# Patient Record
Sex: Male | Born: 1935
Health system: Southern US, Community
[De-identification: ages and names within clinical notes are randomized; demographics above are authoritative.]

## PROBLEM LIST (undated history)

## (undated) DIAGNOSIS — J189 Pneumonia, unspecified organism: Secondary | ICD-10-CM

## (undated) DIAGNOSIS — I495 Sick sinus syndrome: Secondary | ICD-10-CM

## (undated) DIAGNOSIS — Z789 Other specified health status: Secondary | ICD-10-CM

## (undated) DIAGNOSIS — I071 Rheumatic tricuspid insufficiency: Secondary | ICD-10-CM

## (undated) DIAGNOSIS — I10 Essential (primary) hypertension: Secondary | ICD-10-CM

## (undated) DIAGNOSIS — E119 Type 2 diabetes mellitus without complications: Secondary | ICD-10-CM

## (undated) DIAGNOSIS — I639 Cerebral infarction, unspecified: Secondary | ICD-10-CM

## (undated) DIAGNOSIS — I4891 Unspecified atrial fibrillation: Secondary | ICD-10-CM

## (undated) DIAGNOSIS — C189 Malignant neoplasm of colon, unspecified: Secondary | ICD-10-CM

## (undated) DIAGNOSIS — N400 Enlarged prostate without lower urinary tract symptoms: Secondary | ICD-10-CM

## (undated) DIAGNOSIS — K219 Gastro-esophageal reflux disease without esophagitis: Secondary | ICD-10-CM

## (undated) DIAGNOSIS — I4719 Other supraventricular tachycardia: Secondary | ICD-10-CM

## (undated) DIAGNOSIS — H409 Unspecified glaucoma: Secondary | ICD-10-CM

## (undated) DIAGNOSIS — I471 Supraventricular tachycardia: Secondary | ICD-10-CM

## (undated) DIAGNOSIS — E079 Disorder of thyroid, unspecified: Secondary | ICD-10-CM

## (undated) DIAGNOSIS — I509 Heart failure, unspecified: Secondary | ICD-10-CM

## (undated) DIAGNOSIS — E039 Hypothyroidism, unspecified: Secondary | ICD-10-CM

## (undated) DIAGNOSIS — D709 Neutropenia, unspecified: Secondary | ICD-10-CM

## (undated) DIAGNOSIS — R001 Bradycardia, unspecified: Secondary | ICD-10-CM

## (undated) DIAGNOSIS — I517 Cardiomegaly: Secondary | ICD-10-CM

## (undated) DIAGNOSIS — L719 Rosacea, unspecified: Secondary | ICD-10-CM

## (undated) HISTORY — PX: HERNIA REPAIR: SHX51

## (undated) HISTORY — DX: Supraventricular tachycardia: I47.1

## (undated) HISTORY — PX: PROSTATE SURGERY: SHX751

## (undated) HISTORY — PX: APPENDECTOMY: SHX54

## (undated) HISTORY — DX: Gastro-esophageal reflux disease without esophagitis: K21.9

## (undated) HISTORY — DX: Other supraventricular tachycardia: I47.19

## (undated) HISTORY — PX: TRANSURETHRAL RESECTION OF PROSTATE: SHX73

## (undated) HISTORY — DX: Rheumatic tricuspid insufficiency: I07.1

## (undated) HISTORY — DX: Disorder of thyroid, unspecified: E07.9

## (undated) HISTORY — DX: Essential (primary) hypertension: I10

## (undated) HISTORY — PX: UMBILICAL HERNIA REPAIR: SHX196

## (undated) HISTORY — PX: INGUINAL HERNIA REPAIR: SUR1180

## (undated) HISTORY — DX: Cerebral infarction, unspecified: I63.9

## (undated) MED FILL — Dexamethasone Sodium Phosphate Inj 100 MG/10ML: INTRAMUSCULAR | Qty: 1 | Status: AC

---

## 2007-01-06 ENCOUNTER — Ambulatory Visit (HOSPITAL_COMMUNITY): Admission: RE | Admit: 2007-01-06 | Discharge: 2007-01-06 | Payer: Self-pay | Admitting: Urology

## 2010-12-24 ENCOUNTER — Encounter (HOSPITAL_COMMUNITY): Payer: Medicare Other

## 2010-12-24 ENCOUNTER — Other Ambulatory Visit: Payer: Self-pay | Admitting: Ophthalmology

## 2010-12-24 DIAGNOSIS — Z01812 Encounter for preprocedural laboratory examination: Secondary | ICD-10-CM | POA: Insufficient documentation

## 2010-12-24 LAB — HEMOGLOBIN AND HEMATOCRIT, BLOOD: Hemoglobin: 13.2 g/dL (ref 13.0–17.0)

## 2010-12-24 LAB — BASIC METABOLIC PANEL
Calcium: 9 mg/dL (ref 8.4–10.5)
Chloride: 106 mEq/L (ref 96–112)
Creatinine, Ser: 1.2 mg/dL (ref 0.4–1.5)
Potassium: 4 mEq/L (ref 3.5–5.1)

## 2010-12-30 ENCOUNTER — Ambulatory Visit (HOSPITAL_COMMUNITY)
Admission: RE | Admit: 2010-12-30 | Discharge: 2010-12-30 | Disposition: A | Discharge status: Home / Self Care | Payer: Medicare Other | Source: Ambulatory Visit | Attending: Ophthalmology | Admitting: Ophthalmology

## 2010-12-30 DIAGNOSIS — Z7982 Long term (current) use of aspirin: Secondary | ICD-10-CM | POA: Insufficient documentation

## 2010-12-30 DIAGNOSIS — H2510 Age-related nuclear cataract, unspecified eye: Secondary | ICD-10-CM | POA: Insufficient documentation

## 2010-12-30 DIAGNOSIS — I10 Essential (primary) hypertension: Secondary | ICD-10-CM | POA: Insufficient documentation

## 2010-12-30 DIAGNOSIS — Z01810 Encounter for preprocedural cardiovascular examination: Secondary | ICD-10-CM | POA: Insufficient documentation

## 2010-12-30 DIAGNOSIS — Z79899 Other long term (current) drug therapy: Secondary | ICD-10-CM | POA: Insufficient documentation

## 2011-01-23 ENCOUNTER — Encounter (HOSPITAL_COMMUNITY): Payer: Medicare Other

## 2011-01-27 ENCOUNTER — Ambulatory Visit (HOSPITAL_COMMUNITY)
Admission: RE | Admit: 2011-01-27 | Discharge: 2011-01-27 | Disposition: A | Discharge status: Home / Self Care | Payer: Medicare Other | Source: Ambulatory Visit | Attending: Ophthalmology | Admitting: Ophthalmology

## 2011-01-27 DIAGNOSIS — I10 Essential (primary) hypertension: Secondary | ICD-10-CM | POA: Insufficient documentation

## 2011-01-27 DIAGNOSIS — H2510 Age-related nuclear cataract, unspecified eye: Secondary | ICD-10-CM | POA: Insufficient documentation

## 2011-01-27 DIAGNOSIS — Z79899 Other long term (current) drug therapy: Secondary | ICD-10-CM | POA: Insufficient documentation

## 2011-01-27 DIAGNOSIS — Z7982 Long term (current) use of aspirin: Secondary | ICD-10-CM | POA: Insufficient documentation

## 2011-01-27 DIAGNOSIS — Z01812 Encounter for preprocedural laboratory examination: Secondary | ICD-10-CM | POA: Insufficient documentation

## 2011-11-03 DIAGNOSIS — N281 Cyst of kidney, acquired: Secondary | ICD-10-CM | POA: Insufficient documentation

## 2012-05-24 ENCOUNTER — Other Ambulatory Visit: Payer: Self-pay | Admitting: Urology

## 2012-05-24 DIAGNOSIS — N281 Cyst of kidney, acquired: Secondary | ICD-10-CM

## 2012-05-26 ENCOUNTER — Ambulatory Visit
Admission: RE | Admit: 2012-05-26 | Discharge: 2012-05-26 | Disposition: A | Discharge status: Home / Self Care | Payer: Medicare Other | Source: Ambulatory Visit | Attending: Urology | Admitting: Urology

## 2012-05-26 DIAGNOSIS — N281 Cyst of kidney, acquired: Secondary | ICD-10-CM

## 2013-02-28 ENCOUNTER — Other Ambulatory Visit (INDEPENDENT_AMBULATORY_CARE_PROVIDER_SITE_OTHER): Payer: Self-pay | Admitting: Internal Medicine

## 2013-03-25 ENCOUNTER — Other Ambulatory Visit (HOSPITAL_COMMUNITY): Payer: Self-pay | Admitting: Cardiovascular Disease

## 2013-03-25 DIAGNOSIS — I4891 Unspecified atrial fibrillation: Secondary | ICD-10-CM

## 2013-03-29 ENCOUNTER — Ambulatory Visit: Payer: Medicare Other | Admitting: Cardiovascular Disease

## 2013-03-30 ENCOUNTER — Ambulatory Visit (HOSPITAL_COMMUNITY)
Admission: RE | Admit: 2013-03-30 | Discharge: 2013-03-30 | Disposition: A | Discharge status: Home / Self Care | Payer: Medicare Other | Source: Ambulatory Visit | Attending: Cardiovascular Disease | Admitting: Cardiovascular Disease

## 2013-03-30 DIAGNOSIS — I359 Nonrheumatic aortic valve disorder, unspecified: Secondary | ICD-10-CM | POA: Insufficient documentation

## 2013-03-30 DIAGNOSIS — I10 Essential (primary) hypertension: Secondary | ICD-10-CM | POA: Insufficient documentation

## 2013-03-30 DIAGNOSIS — I379 Nonrheumatic pulmonary valve disorder, unspecified: Secondary | ICD-10-CM | POA: Insufficient documentation

## 2013-03-30 DIAGNOSIS — I495 Sick sinus syndrome: Secondary | ICD-10-CM | POA: Insufficient documentation

## 2013-03-30 DIAGNOSIS — K219 Gastro-esophageal reflux disease without esophagitis: Secondary | ICD-10-CM | POA: Insufficient documentation

## 2013-03-30 DIAGNOSIS — I059 Rheumatic mitral valve disease, unspecified: Secondary | ICD-10-CM | POA: Insufficient documentation

## 2013-03-30 DIAGNOSIS — I079 Rheumatic tricuspid valve disease, unspecified: Secondary | ICD-10-CM | POA: Insufficient documentation

## 2013-03-30 DIAGNOSIS — I4891 Unspecified atrial fibrillation: Secondary | ICD-10-CM

## 2013-03-30 DIAGNOSIS — I517 Cardiomegaly: Secondary | ICD-10-CM | POA: Insufficient documentation

## 2013-03-30 DIAGNOSIS — I4949 Other premature depolarization: Secondary | ICD-10-CM | POA: Insufficient documentation

## 2013-03-31 NOTE — Progress Notes (Signed)
Echo Completed. Veda Canning, RDCS

## 2013-04-04 ENCOUNTER — Ambulatory Visit (INDEPENDENT_AMBULATORY_CARE_PROVIDER_SITE_OTHER): Payer: Medicare Other | Admitting: Cardiovascular Disease

## 2013-04-04 ENCOUNTER — Encounter: Payer: Self-pay | Admitting: Cardiovascular Disease

## 2013-04-04 VITALS — BP 146/80 | HR 77 | Ht 68.0 in | Wt 161.8 lb

## 2013-04-04 DIAGNOSIS — N401 Enlarged prostate with lower urinary tract symptoms: Secondary | ICD-10-CM | POA: Insufficient documentation

## 2013-04-04 DIAGNOSIS — K219 Gastro-esophageal reflux disease without esophagitis: Secondary | ICD-10-CM

## 2013-04-04 DIAGNOSIS — N400 Enlarged prostate without lower urinary tract symptoms: Secondary | ICD-10-CM

## 2013-04-04 DIAGNOSIS — I498 Other specified cardiac arrhythmias: Secondary | ICD-10-CM

## 2013-04-04 DIAGNOSIS — I4891 Unspecified atrial fibrillation: Secondary | ICD-10-CM

## 2013-04-04 DIAGNOSIS — I119 Hypertensive heart disease without heart failure: Secondary | ICD-10-CM

## 2013-04-04 DIAGNOSIS — I495 Sick sinus syndrome: Secondary | ICD-10-CM | POA: Insufficient documentation

## 2013-04-04 DIAGNOSIS — I471 Supraventricular tachycardia: Secondary | ICD-10-CM

## 2013-04-04 DIAGNOSIS — E782 Mixed hyperlipidemia: Secondary | ICD-10-CM

## 2013-04-04 MED ORDER — DRONEDARONE HCL 400 MG PO TABS: 400.0000 mg | ORAL_TABLET | Freq: Two times a day (BID) | ORAL | Status: DC

## 2013-04-04 NOTE — Patient Instructions (Signed)
Your physician recommends that you return for lab work .  Your physician has recommended you make the following change in your medication: Multaq 400 mg has been started to take twice daily.This medication has been sent to your pharmacy.  Your physician recommends that you schedule a follow-up appointment in: 3 weeks.

## 2013-04-04 NOTE — Progress Notes (Signed)
Patient ID: DAIMIEN PATMON, male   DOB: 12-09-1925, 79 y.o.   MRN: [id number]    HPI: Dylan York, is a 79 y.o. male who presents to the office today for cardiology followup evaluation of his recent documentation of atrial fibrillation.  Mr. Terhune has a history of probable sick sinus syndrome in the past has had episodes of atrial tachycardia as well as sinus bradycardia with also ventricular bigeminy and frequent PVCs. This ultimately stabilized fairly well with slow titration of his Bystolic. He also has a history of BPH and status post TURP by Dr. Darvin Neighbours, history of hypertension, moderate tricuspid regurgitation, as well as GERD.  When  I last saw him on 03/08/2013 he was in atrial fibrillation of questionable duration. At that time ventricular rate was 85 beats per minute and he had nonspecific ST changes. That time, I did increase his Bystolic to 7.5 mg in the morning and recommended he continue 5 mg at night. He would not become hypotensive I discontinued his 1 mg of Cardura. I also initiated anticoagulation with Ellik was 2.5 mg twice a day. I scheduled him for a followup echo Doppler study. This was done on 03/30/2013 and revealed normal systolic function with an ejection fraction of 60-65%. He did have aortic valve sclerosis. HEENT significant biatrial enlargement as well as mild pulmonary hypertension with a PA pressure 41 mm. He presents now for followup evaluation.  Past history again is notable for palpitations, BPH, TURP, hypertension, and in the past he was treated with doxycycline for tick bite.    Current Outpatient Prescriptions  Medication Sig Dispense Refill  . apixaban (ELIQUIS) 2.5 MG TABS tablet Take by mouth 2 (two) times daily.      . finasteride (PROSCAR) 5 MG tablet Take 5 mg by mouth daily.      . hydrochlorothiazide (HYDRODIURIL) 12.5 MG tablet Take 12.5 mg by mouth daily.      . nebivolol (BYSTOLIC) 5 MG tablet Take 5 mg by mouth 2 (two) times daily.      Marland Kitchen  dronedarone (MULTAQ) 400 MG tablet Take 1 tablet (400 mg total) by mouth 2 (two) times daily with a meal.  60 tablet  6  . omeprazole (PRILOSEC) 20 MG capsule TAKE 1 CAPSULE BY MOUTH 30 MINUTES BEFORE BREAKFAST  30 capsule  11   No current facility-administered medications for this visit.    Social history is notable in that he is married has 3 children. His remote tobacco history but he quit in 1978. There is no alcohol use. He keeps himself busy and spends much time outdoors. 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 He also plays golf at least once a week.   ROS is notable for shortness of breath with activity. He denies chest pain. He denies fever chills or night sweats. He denies leg swelling. He denies bleeding since initiating Ellik was. He is unaware of his irregular rhythm. He denies paresthesias. He denies visual symptoms. He denies headache. Other system review is negative  PE BP 146/80  Pulse 77  Ht 5\' 8"  (1.727 m)  Wt 161 lb 12.8 oz (73.392 kg)  BMI 24.61 kg/m2  General: Alert, oriented, no distress.  HEENT: Normocephalic, atraumatic. Pupils round and reactive; sclera anicteric;  Nose without nasal septal hypertrophy Mouth/Parynx benign; Mallinpatti scale 3 Neck: No JVD, no carotid briuts Lungs: clear to ausculatation and percussion; no wheezing or rales Heart: Irregularly irregular rhythm with a 1/6 systolic murmur. s1 s2 normal  Abdomen: soft,  nontender; no hepatosplenomehaly, BS+; abdominal aorta nontender and not dilated by palpation. Pulses 2+ Extremities: no clubbinbg cyanosis or edema, Homan's sign negative  Neurologic: grossly nonfocal  ECG: Atrial fibrillation at 77 beats per minute  QTc 475 ms procures duration 110 ms.  LABS:  BMET    Component Value Date/Time   NA 139 12/24/2010 1122   K 4.0 12/24/2010 1122   CL 106 12/24/2010 1122   CO2 22 12/24/2010 1122   GLUCOSE 140* 12/24/2010 1122   BUN 14 12/24/2010 1122   CREATININE 1.20 12/24/2010 1122   CALCIUM 9.0 12/24/2010  1122   GFRNONAA 58* 12/24/2010 1122   GFRAA  Value: >60        The eGFR has been calculated using the MDRD equation. This calculation has not been validated in all clinical situations. eGFR's persistently <60 mL/min signify possible Chronic Kidney Disease. 12/24/2010 1122     Hepatic Function Panel  No results found for this basename: prot, albumin, ast, alt, alkphos, bilitot, bilidir, ibili     CBC    Component Value Date/Time   HGB 13.2 12/24/2010 1122   HCT 38.3* 12/24/2010 1122     BNP No results found for this basename: probnp    Lipid Panel  No results found for this basename: chol, trig, hdl, cholhdl, vldl, ldlcalc     RADIOLOGY: No results found.    ASSESSMENT AND PLAN: History Kussman remains in persistent atrial fibrillation of at least several weeks' duration. He now has been on Ellik was anticoagulation was initiated on 03/08/2013. His echo Doppler study confirm systolic function is normal with an ejection fraction of 60-65%. He does have significant biatrial enlargement which may be contributory to his atrial fibrillation. Presently, since he does not have any history of heart failure and has concerns with being outside with amiodarone and sun exposure I am initiating multiform milligrams twice a day. I will check a complete set of laboratory the fasting state consisting of a CBC, comprehensive metabolic panel, magnesium, thyroid function studies, and lipid status. I will see him in 3 weeks for followup evaluation. At that time he still maintaining his atrial fibrillation we will then make plans to consider attempt at restoration of sinus rhythm with cardioversion.     Lennette Bihari, MD, [facility]  04/04/2013 6:28 PM

## 2013-04-05 ENCOUNTER — Other Ambulatory Visit (INDEPENDENT_AMBULATORY_CARE_PROVIDER_SITE_OTHER): Payer: Self-pay | Admitting: Internal Medicine

## 2013-04-05 ENCOUNTER — Telehealth: Payer: Self-pay | Admitting: Cardiovascular Disease

## 2013-04-05 NOTE — Telephone Encounter (Signed)
Mr.Warning is calling because one of his medications have been left off his list of medications.Marland KitchenLotensin40mg  and he is schedule to continue to take this medication .Marland Kitchen Any questions please call at [phone].  thanks

## 2013-04-05 NOTE — Telephone Encounter (Signed)
Message forwarded to Dr. Kelly/Wanda, CMA.  

## 2013-04-06 ENCOUNTER — Telehealth: Payer: Self-pay | Admitting: *Deleted

## 2013-04-06 ENCOUNTER — Other Ambulatory Visit (INDEPENDENT_AMBULATORY_CARE_PROVIDER_SITE_OTHER): Payer: Self-pay | Admitting: Internal Medicine

## 2013-04-06 DIAGNOSIS — K219 Gastro-esophageal reflux disease without esophagitis: Secondary | ICD-10-CM

## 2013-04-06 LAB — CBC
MCHC: 34 g/dL (ref 30.0–36.0)
MCV: 93.2 fL (ref 78.0–100.0)
RDW: 13.1 % (ref 11.5–15.5)
WBC: 4.2 10*3/uL (ref 4.0–10.5)

## 2013-04-06 LAB — LIPID PANEL
Cholesterol: 162 mg/dL (ref 0–200)
LDL Cholesterol: 92 mg/dL (ref 0–99)
Total CHOL/HDL Ratio: 3.1 Ratio
Triglycerides: 84 mg/dL (ref <150)

## 2013-04-06 MED ORDER — OMEPRAZOLE 20 MG PO CPDR: DELAYED_RELEASE_CAPSULE | ORAL | Status: DC

## 2013-04-06 NOTE — Telephone Encounter (Signed)
Patient called to notify us that Lotensin was left off of his medication list. Questioned if he is supposed to continue medication. Informed him per Dr. Tresa Endo to continue as before. I will add it back to his medication list.

## 2013-04-07 ENCOUNTER — Telehealth: Payer: Self-pay | Admitting: Cardiovascular Disease

## 2013-04-07 NOTE — Telephone Encounter (Signed)
Discontinue Multaq.  Follow up as scheduled with Dr. Tresa Endo.

## 2013-04-07 NOTE — Telephone Encounter (Signed)
Dr Tresa Endo started him on a new medicine Monday for atrial fib-having side effects-Please call!

## 2013-04-07 NOTE — Telephone Encounter (Signed)
Returned call and informed pt per instructions by MD/PA.  Pt verbalized understanding and agreed w/ plan.  Message forwarded to Dr. Tresa Endo.  This note placed on Dr. Landry Dyke cart for review by Dr. Tresa Endo on Monday.  Pt aware.

## 2013-04-07 NOTE — Telephone Encounter (Signed)
Returned call.  Pt stated he saw Dr. Tresa Endo on Monday and he prescribed a medication for afib.  Stated he has taken four pills.  Pt stated he has experienced SOB this morning walking a short distance on a flat surface.  Also stated he didn't sleep well last night.  C/o diarrhea and fatigue.  Stated stool was normal, then loose, then "full blown diarrhea."  Stated he was a little nauseous last night.  Denied changes in diet or liquid intake.  Stated [place] and he wanted to call before leaving.  Stated new med is Advertising copywriter.  Pt informed Dr. Tresa Endo is out of the office for the rest of the week and a PA will be notified to review and advise.  Pt verbalized understanding and agreed w/ plan.  Message forwarded to B. Leron Croak, PA-C for further instructions.  No paper chart.  Last OV in Epic.

## 2013-04-12 NOTE — Telephone Encounter (Signed)
Med list updated

## 2013-04-22 NOTE — Progress Notes (Signed)
Quick Note:  Patient has appointment on 6/24 to discuss results. ______

## 2013-04-26 ENCOUNTER — Encounter: Payer: Self-pay | Admitting: Cardiovascular Disease

## 2013-04-26 ENCOUNTER — Ambulatory Visit (INDEPENDENT_AMBULATORY_CARE_PROVIDER_SITE_OTHER): Payer: Medicare Other | Admitting: Cardiovascular Disease

## 2013-04-26 VITALS — BP 110/80 | HR 78 | Ht 68.0 in | Wt 160.2 lb

## 2013-04-26 DIAGNOSIS — I4891 Unspecified atrial fibrillation: Secondary | ICD-10-CM

## 2013-04-26 DIAGNOSIS — I119 Hypertensive heart disease without heart failure: Secondary | ICD-10-CM

## 2013-04-26 DIAGNOSIS — Z7901 Long term (current) use of anticoagulants: Secondary | ICD-10-CM

## 2013-04-26 DIAGNOSIS — I495 Sick sinus syndrome: Secondary | ICD-10-CM

## 2013-04-26 MED ORDER — AMIODARONE HCL 200 MG PO TABS: 200.0000 mg | ORAL_TABLET | Freq: Every day | ORAL | Status: DC

## 2013-04-26 NOTE — Patient Instructions (Addendum)
Your physician recommends that you schedule a follow-up appointment in: 4 weeks  Your physician has recommended you make the following change in your medication: stop MULTAQ. Start new prescription for amiodarone. This has already been sent to your pharmacy.

## 2013-04-27 NOTE — Progress Notes (Signed)
Quick Note:  Patient has appoinmtent on 04/26/13. Results discussed during the visit. ______

## 2013-05-02 ENCOUNTER — Telehealth: Payer: Self-pay | Admitting: Cardiovascular Disease

## 2013-05-02 NOTE — Telephone Encounter (Signed)
Eliquis has been approved through 05/02/2014 w/[employer]

## 2013-05-02 NOTE — Telephone Encounter (Signed)
Is calling to find out if you have located the information in which you needed for the insurance so that he may get his medication .Marland Kitchen

## 2013-05-02 NOTE — Telephone Encounter (Signed)
Please call-his medication was declined by his insurance!i

## 2013-05-02 NOTE — Telephone Encounter (Signed)
Returned call.  Pt stated he spoke w/ Britta Mccreedy earlier and gave her all of the information about Eliquis.  Stated she did tell him there was a card he could get for a free 30-day trial and he wasn't able to come get it.  Stated his sister-in-law is at [facility] and can come by to pick it up for him if that's okay.  Pt stated he only has two days left of medication.  Pt informed card will be left at front desk for pick-up and Britta Mccreedy will be notified since she is working on PA already.  Pt verbalized understanding and agreed w/ plan.

## 2013-05-05 ENCOUNTER — Encounter: Payer: Self-pay | Admitting: Cardiovascular Disease

## 2013-05-05 DIAGNOSIS — Z7901 Long term (current) use of anticoagulants: Secondary | ICD-10-CM | POA: Insufficient documentation

## 2013-05-05 NOTE — Progress Notes (Signed)
Patient ID: Dylan York, male   DOB: [birth year], 79 y.o.   MRN: [id number]     HPI: Dylan York, is a 79 y.o. male who presents to the office in followup of his recent development of atrial fibrillation.  [name] has a history of sick sinus syndrome in the past has felt episodes of atrial tachycardia as well as sinus bradycardia, ventricular bigeminy, and frequent PVCs he had ultimately stabilized with Bystolic when he was seen on 03/08/2013 atrial fibrillation of questionable duration. At that time I increased his and his started anticoagulation with Eliquis 2.5 mg twice a day. An echo Doppler study was done on 03/30/2013 which revealed normal systolic function with an ejection fraction of 60-65%, aortic valve sclerosis, biatrial enlargement, as well as mild pulmonary hypertension with a PA pressure of 41 mm. He was seen in followup on 04/04/2013 facility for further fibrillation at 77 beats per minute. After much discussion with the restoration of sinus rhythm and antiarrhythmic therapy alternatives, and ultimately started him on Multaq 400 milligrams twice a day. He apparently did not seem to tolerate this and ultimately discontinue its use. He presents for followup evaluation.  [name] denies chest pain. He denies presyncope. He denies increasing shortness of breath admits that he was a little more fatigued he had been previously.   Past medical history is notable for hypertension, BPH, TURP.  Not on File  Current Outpatient Prescriptions  Medication Sig Dispense Refill  . apixaban (ELIQUIS) 2.5 MG TABS tablet Take by mouth 2 (two) times daily.      . benazepril (LOTENSIN) 40 MG tablet Take 40 mg by mouth daily.      . finasteride (PROSCAR) 5 MG tablet Take 5 mg by mouth daily.      . hydrochlorothiazide (HYDRODIURIL) 12.5 MG tablet Take 12.5 mg by mouth daily.      . nebivolol (BYSTOLIC) 5 MG tablet Take 5 mg by mouth 2 (two) times daily.      Marland Kitchen omeprazole (PRILOSEC) 20 MG capsule  TAKE 1 CAPSULE BY MOUTH 30 MINUTES BEFORE BREAKFAST EMERGENCY REFILL FAXED DR  90 capsule  11  . TRAVATAN Z 0.004 % SOLN ophthalmic solution Place 1 drop into both eyes daily.      Marland Kitchen amiodarone (PACERONE) 200 MG tablet Take 1 tablet (200 mg total) by mouth daily.  30 tablet  6  . dronedarone (MULTAQ) 400 MG tablet Take 1 tablet (400 mg total) by mouth 2 (two) times daily with a meal.  60 tablet  6   No current facility-administered medications for this visit.    Socially he is married and has 3 children he quit tobacco in 1978. 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  ROS is negative for fevers, chills or night sweats. He denies rash he apparently had only taken the multiecho 2 days before he discontinued it. He denies recent reflux he denies any heart failure. He denies syncope. He denies bleeding. Denies paresthesias.   Other system review is negative.  PE BP 110/80  Pulse 78  Ht 5\' 8"  (1.727 m)  Wt 160 lb 3.2 oz (72.666 kg)  BMI 24.36 kg/m2  General: Alert, oriented, no distress.  Skin: normal turgor, no rashes HEENT: Normocephalic, atraumatic. Pupils round and reactive; sclera anicteric;no lid lag.  Nose without nasal septal hypertrophy Mouth/Parynx benign; Mallinpatti scale 3 Neck: No JVD, no carotid briuts Lungs: clear to ausculatation and percussion; no  wheezing or rales Heart:  irregular rhythm with a ventricular rate approximately 80; no s3. 1/6 sem Abdomen: soft, nontender; no hepatosplenomehaly, BS+; abdominal aorta nontender and not dilated by palpation. Pulses 2+ Extremities: no clubbing cyanosis or edema, Homan's sign negative  Neurologic: grossly nonfocal  ECG: Atrial fibrillation at 70 beats per minute with nonspecific ST-T changes. QTc  interval 481 ms  LABS:  BMET    Component Value Date/Time   NA 139 12/24/2010 1122   K 4.0 12/24/2010 1122   CL 106 12/24/2010 1122   CO2 22 12/24/2010 1122   GLUCOSE 140* 12/24/2010  1122   BUN 14 12/24/2010 1122   CREATININE 1.20 12/24/2010 1122   CALCIUM 9.0 12/24/2010 1122   GFRNONAA 58* 12/24/2010 1122   GFRAA  Value: >60        The eGFR has been calculated using the MDRD equation. This calculation has not been validated in all clinical situations. eGFR's persistently <60 mL/min signify possible Chronic Kidney Disease. 12/24/2010 1122     Hepatic Function Panel  No results found for this basename: prot, albumin, ast, alt, alkphos, bilitot, bilidir, ibili     CBC    Component Value Date/Time   WBC 4.2 04/06/2013 0850   RBC 4.13* 04/06/2013 0850   HGB 13.1 04/06/2013 0850   HCT 38.5* 04/06/2013 0850   PLT 187 04/06/2013 0850   MCV 93.2 04/06/2013 0850   MCH 31.7 04/06/2013 0850   MCHC 34.0 04/06/2013 0850   RDW 13.1 04/06/2013 0850     BNP No results found for this basename: probnp    Lipid Panel     Component Value Date/Time   CHOL 162 04/06/2013 0850   TRIG 84 04/06/2013 0850   HDL 53 04/06/2013 0850   CHOLHDL 3.1 04/06/2013 0850   VLDL 17 04/06/2013 0850   LDLCALC 92 04/06/2013 0850     RADIOLOGY: No results found.    ASSESSMENT AND PLAN: Mr. [name] continues to be in persistent atrial fibrillation rate of at least > 6 weeks duration. He has been on and tie coagulation since 03/08/2013. He did not convert with increased beta blocker therapy or a trial of tach. Presently, I will give him a trial of low-dose amiodarone at 200 mg daily. I will see him back in the office in 4 weeks for followup evaluation. I did review the echo Doppler study with him. I reviewed his recent laboratory data hemoglobin 13.1 hematocrit 38.5. Total cholesterol 162 triglycerides 84 HDL 53 LDL 92. TSH 2.218 and magnesium level is 1.9. He is still in atrial fibrillation attempt at cardioversion low-dose amiodarone versus ciliary control his atrial fibrillation.       Lennette Bihari, MD, [facility]  05/05/2013 12:30 PM

## 2013-05-27 ENCOUNTER — Ambulatory Visit (INDEPENDENT_AMBULATORY_CARE_PROVIDER_SITE_OTHER): Payer: Medicare Other | Admitting: Cardiovascular Disease

## 2013-05-27 ENCOUNTER — Encounter: Payer: Self-pay | Admitting: Cardiovascular Disease

## 2013-05-27 VITALS — BP 142/88 | HR 68 | Ht 68.0 in | Wt 162.6 lb

## 2013-05-27 DIAGNOSIS — I4891 Unspecified atrial fibrillation: Secondary | ICD-10-CM

## 2013-05-27 DIAGNOSIS — I495 Sick sinus syndrome: Secondary | ICD-10-CM

## 2013-05-27 DIAGNOSIS — I119 Hypertensive heart disease without heart failure: Secondary | ICD-10-CM

## 2013-05-27 DIAGNOSIS — Z7901 Long term (current) use of anticoagulants: Secondary | ICD-10-CM

## 2013-05-27 MED ORDER — AMIODARONE HCL 200 MG PO TABS: 200.0000 mg | ORAL_TABLET | Freq: Two times a day (BID) | ORAL | Status: DC

## 2013-05-27 NOTE — Patient Instructions (Addendum)
Your physician recommends that you schedule a follow-up appointment in: 3-4 weeks  Your physician has recommended you make the following change in your medication: increase Amiodarone to 200 mg twice daily and cut bystolic to 5 mg daily

## 2013-05-27 NOTE — Progress Notes (Signed)
Patient ID: Dylan York, male   DOB: November 16, 1925, 79 y.o.   MRN: [id number]     HPI: Dylan York, is a 79 y.o. male who presents to the office in followup of his recent development of atrial fibrillation.  Dylan York has a history of sick sinus syndrome in the past has felt episodes of atrial tachycardia as well as sinus bradycardia, ventricular bigeminy, and frequent PVCs he had ultimately stabilized with Bystolic when he was seen on 03/08/2013 atrial fibrillation of questionable duration. At that time I increased his Bystolic and his started anticoagulation with Eliquis 2.5 mg twice a day. An echo Doppler study was done on 03/30/2013 which revealed normal systolic function with an ejection fraction of 60-65%, aortic valve sclerosis, biatrial enlargement, as well as mild pulmonary hypertension with a PA pressure of 41 mm. He was seen in followup on 04/04/2013 and was in AF 77 beats per minute. After much discussion with the restoration of sinus rhythm and antiarrhythmic therapy alternatives, and ultimately started him on Multaq 400 milligrams twice a day. He apparently did not seem to tolerate this and ultimately discontinue its use. He presents for followup evaluation. I last saw him on 04/26/2013. At that time, we had further discussion concerning his atrial fibrillation and elected to initiate amiodarone 200 mg daily. He has tolerated this well. He denies any significant side effects. He still notes some shortness of breath particularly when he gets his mail walking up and down a hill.   Dylan York denies chest pain. He denies presyncope. He denies increasing shortness of breath admits that he was a little more fatigued he had been previously.   Past medical history is notable for hypertension, BPH, TURP.  Allergies  Allergen Reactions  . Multaq (Dronedarone) Shortness Of Breath    Current Outpatient Prescriptions  Medication Sig Dispense Refill  . amiodarone (PACERONE) 200 MG tablet Take 200  mg by mouth 2 (two) times daily.      Marland Kitchen apixaban (ELIQUIS) 2.5 MG TABS tablet Take by mouth 2 (two) times daily.      . benazepril (LOTENSIN) 40 MG tablet Take 40 mg by mouth daily.      . finasteride (PROSCAR) 5 MG tablet Take 5 mg by mouth daily.      . hydrochlorothiazide (HYDRODIURIL) 12.5 MG tablet Take 12.5 mg by mouth daily.      . Multiple Vitamins-Minerals (MULTIVITAL PO) Take by mouth daily.      . nebivolol (BYSTOLIC) 5 MG tablet Take 5 mg by mouth daily.       Marland Kitchen omeprazole (PRILOSEC) 20 MG capsule TAKE 1 CAPSULE BY MOUTH 30 MINUTES BEFORE BREAKFAST EMERGENCY REFILL FAXED DR  90 capsule  11  . TRAVATAN Z 0.004 % SOLN ophthalmic solution Place 1 drop into both eyes daily.      Marland Kitchen VITAMIN E PO Take by mouth daily.       No current facility-administered medications for this visit.    Socially he is married and has 3 children he quit tobacco in 1978. 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  ROS is negative for fevers, chills or night sweats. He denies rash. There is no wheezing. He denies recent tachycardia palpitations. There is no presyncope. He denies recent reflux he denies any heart failure. He denies syncope. He denies bleeding. He denies abdominal pain. Denies paresthesias.   Other system review is negative.  PE BP 142/88  Pulse 68  Ht 5\' 8"  (1.727 m)  Wt 162 lb 9.6 oz (73.755 kg)  BMI 24.73 kg/m2  General: Alert, oriented, no distress.  Skin: normal turgor, no rashes HEENT: Normocephalic, atraumatic. Pupils round and reactive; sclera anicteric;no lid lag.  Nose without nasal septal hypertrophy Mouth/Parynx benign; Mallinpatti scale 3 Neck: No JVD, no carotid briuts Lungs: clear to ausculatation and percussion; no wheezing or rales Heart:  irregular rhythm with a ventricular rate approximately  68; no s3. 1/6 sem Abdomen: soft, nontender; no hepatosplenomehaly, BS+; abdominal aorta nontender and not dilated by  palpation. Pulses 2+ Extremities: no clubbing cyanosis or edema, Homan's sign negative  Neurologic: grossly nonfocal  ECG: Atrial fibrillation at 68 beats per minute with nonspecific ST-T changes. QTc  interval 455 ms  LABS:  BMET    Component Value Date/Time   NA 139 12/24/2010 1122   K 4.0 12/24/2010 1122   CL 106 12/24/2010 1122   CO2 22 12/24/2010 1122   GLUCOSE 140* 12/24/2010 1122   BUN 14 12/24/2010 1122   CREATININE 1.20 12/24/2010 1122   CALCIUM 9.0 12/24/2010 1122   GFRNONAA 58* 12/24/2010 1122   GFRAA  Value: >60        The eGFR has been calculated using the MDRD equation. This calculation has not been validated in all clinical situations. eGFR's persistently <60 mL/min signify possible Chronic Kidney Disease. 12/24/2010 1122     Hepatic Function Panel  No results found for this basename: prot,  albumin,  ast,  alt,  alkphos,  bilitot,  bilidir,  ibili     CBC    Component Value Date/Time   WBC 4.2 04/06/2013 0850   RBC 4.13* 04/06/2013 0850   HGB 13.1 04/06/2013 0850   HCT 38.5* 04/06/2013 0850   PLT 187 04/06/2013 0850   MCV 93.2 04/06/2013 0850   MCH 31.7 04/06/2013 0850   MCHC 34.0 04/06/2013 0850   RDW 13.1 04/06/2013 0850     BNP No results found for this basename: probnp    Lipid Panel     Component Value Date/Time   CHOL 162 04/06/2013 0850   TRIG 84 04/06/2013 0850   HDL 53 04/06/2013 0850   CHOLHDL 3.1 04/06/2013 0850   VLDL 17 04/06/2013 0850   LDLCALC 92 04/06/2013 0850     RADIOLOGY: No results found.    ASSESSMENT AND PLAN: [name] continues to be in persistent atrial fibrillation rate of at least several months duration. He has been on and tie coagulation since 03/08/2013. He did not convert with increased beta blocker therapy or a trial of Multaq.  He has tolerated a trial of low-dose amiodarone at 200 mg daily. EKG today continues to show atrial fibrillation with a controlled ventricular response. He is unaware of breakthrough tachycardia palpitations. At this  point, we discussed consideration for attempt at cardioversion. I am recommending he increase his amiodarone to 200 mg twice a day since he is tolerating the 2 mg dose and I will reduce his diastolic from 10 mg to 5 mg. If his resting pulse gets into the low 50s he will discontinue the Bystolic altogether while taking the higher amiodarone dose. We discussed options considering cardioversion in several weeks versus seeing him back in the office. He would prefer a followup office visit. I will see him in 3-4 weeks for followup evaluation.      Lennette Bihari, MD, [facility]  05/27/2013 9:27 AM

## 2013-06-10 ENCOUNTER — Encounter: Payer: Self-pay | Admitting: Cardiovascular Disease

## 2013-06-24 ENCOUNTER — Encounter: Payer: Self-pay | Admitting: *Deleted

## 2013-06-29 ENCOUNTER — Ambulatory Visit (INDEPENDENT_AMBULATORY_CARE_PROVIDER_SITE_OTHER): Payer: Medicare Other | Admitting: Cardiovascular Disease

## 2013-06-29 ENCOUNTER — Encounter: Payer: Self-pay | Admitting: Cardiovascular Disease

## 2013-06-29 VITALS — BP 130/80 | HR 65 | Ht 68.0 in | Wt 160.0 lb

## 2013-06-29 DIAGNOSIS — I495 Sick sinus syndrome: Secondary | ICD-10-CM

## 2013-06-29 DIAGNOSIS — Z01818 Encounter for other preprocedural examination: Secondary | ICD-10-CM

## 2013-06-29 DIAGNOSIS — Z7901 Long term (current) use of anticoagulants: Secondary | ICD-10-CM

## 2013-06-29 DIAGNOSIS — K219 Gastro-esophageal reflux disease without esophagitis: Secondary | ICD-10-CM

## 2013-06-29 DIAGNOSIS — I4891 Unspecified atrial fibrillation: Secondary | ICD-10-CM

## 2013-06-29 DIAGNOSIS — D689 Coagulation defect, unspecified: Secondary | ICD-10-CM

## 2013-06-29 DIAGNOSIS — I119 Hypertensive heart disease without heart failure: Secondary | ICD-10-CM

## 2013-06-29 MED ORDER — APIXABAN 2.5 MG PO TABS: 2.5000 mg | ORAL_TABLET | Freq: Two times a day (BID) | ORAL | Status: DC

## 2013-06-29 NOTE — Patient Instructions (Addendum)
Your physician has recommended that you have a Cardioversion (DCCV). Electrical Cardioversion uses a jolt of electricity to your heart either through paddles or wired patches attached to your chest. This is a controlled, usually prescheduled, procedure. Defibrillation is done under light anesthesia in the hospital, and you usually go home the day of the procedure. This is done to get your heart back into a normal rhythm. You are not awake for the procedure. Please see the instruction sheet given to you today. This will be done the week of September 22.  Your physician recommends that you return for lab work and chest x-ray within 7 days of the procedure.

## 2013-07-03 ENCOUNTER — Encounter: Payer: Self-pay | Admitting: *Deleted

## 2013-07-03 ENCOUNTER — Telehealth: Payer: Self-pay | Admitting: *Deleted

## 2013-07-03 ENCOUNTER — Other Ambulatory Visit: Payer: Self-pay | Admitting: *Deleted

## 2013-07-03 DIAGNOSIS — Z01818 Encounter for other preprocedural examination: Secondary | ICD-10-CM

## 2013-07-03 NOTE — Telephone Encounter (Signed)
Orders for cardioversion entered into the [facility] system.

## 2013-07-21 ENCOUNTER — Telehealth: Payer: Self-pay | Admitting: *Deleted

## 2013-07-21 ENCOUNTER — Other Ambulatory Visit: Payer: Self-pay | Admitting: *Deleted

## 2013-07-21 ENCOUNTER — Ambulatory Visit
Admission: RE | Admit: 2013-07-21 | Discharge: 2013-07-21 | Disposition: A | Discharge status: Home / Self Care | Payer: Medicare Other | Source: Ambulatory Visit | Attending: Cardiovascular Disease | Admitting: Cardiovascular Disease

## 2013-07-21 DIAGNOSIS — R935 Abnormal findings on diagnostic imaging of other abdominal regions, including retroperitoneum: Secondary | ICD-10-CM

## 2013-07-21 DIAGNOSIS — Z01818 Encounter for other preprocedural examination: Secondary | ICD-10-CM

## 2013-07-21 DIAGNOSIS — R918 Other nonspecific abnormal finding of lung field: Secondary | ICD-10-CM

## 2013-07-21 LAB — COMPREHENSIVE METABOLIC PANEL
ALT: 21 U/L (ref 0–53)
AST: 24 U/L (ref 0–37)
Albumin: 3.9 g/dL (ref 3.5–5.2)
Alkaline Phosphatase: 66 U/L (ref 39–117)
CO2: 28 mEq/L (ref 19–32)
Chloride: 97 mEq/L (ref 96–112)
Sodium: 135 mEq/L (ref 135–145)
Total Bilirubin: 1.9 mg/dL — ABNORMAL HIGH (ref 0.3–1.2)
Total Protein: 6.3 g/dL (ref 6.0–8.3)

## 2013-07-21 LAB — CBC
MCH: 32.3 pg (ref 26.0–34.0)
MCV: 93.7 fL (ref 78.0–100.0)
RBC: 4.46 MIL/uL (ref 4.22–5.81)
RDW: 14.4 % (ref 11.5–15.5)

## 2013-07-21 LAB — PROTIME-INR: INR: 1.31 (ref <1.50)

## 2013-07-21 NOTE — Progress Notes (Signed)
Quick Note:  CT chest with contrast ordered per Dr. Tresa Endo. Scheduled for 2:15 pm tomorrow @ [facility] [address] Patient notified of appointment and prep details- NO SOLID FOODS AFTER 10:30 am. Liquids and meds only. Patient voiced understanding. ______

## 2013-07-21 NOTE — Telephone Encounter (Signed)
Chest xray done this morning shows a 15mm density in the left mid lung. CT with contrast is recommended by the radiologist.  Please see full dictation for details.  I will send this message to Dr Tresa Endo

## 2013-07-22 ENCOUNTER — Ambulatory Visit
Admission: RE | Admit: 2013-07-22 | Discharge: 2013-07-22 | Disposition: A | Discharge status: Home / Self Care | Payer: Medicare Other | Source: Ambulatory Visit | Attending: Cardiovascular Disease | Admitting: Cardiovascular Disease

## 2013-07-22 DIAGNOSIS — R935 Abnormal findings on diagnostic imaging of other abdominal regions, including retroperitoneum: Secondary | ICD-10-CM

## 2013-07-22 DIAGNOSIS — R918 Other nonspecific abnormal finding of lung field: Secondary | ICD-10-CM

## 2013-07-22 MED ORDER — IOHEXOL 300 MG/ML  SOLN
75.0000 mL | Freq: Once | INTRAMUSCULAR | Status: AC | PRN
  Administered 2013-07-22: 75 mL via INTRAVENOUS

## 2013-07-25 ENCOUNTER — Telehealth: Payer: Self-pay | Admitting: Cardiovascular Disease

## 2013-07-25 NOTE — Telephone Encounter (Signed)
Spoke with Rae Roam @ patient's PCP . Appointment scheduled to evaluate the pneumonia. Chest xray and chest CT reports faxed to PCP.

## 2013-07-25 NOTE — Telephone Encounter (Signed)
Burna Mortimer is working on this w/ Dr. Tresa Endo.

## 2013-07-25 NOTE — Telephone Encounter (Signed)
Informed patient and wife CT chest confirms patient has probable pneumonia. He will need to see his PCP for evaluation and treatment. DCCV will need to be cancelled until after pneumonia has been treated. I called patients pcp and got him scheduled to be seen  @ 12:00 noon tomorrow. Patient's wife notified of appointment. Both the chest xray and chest CT faxed to [phone] attent: Rae Roam.

## 2013-07-25 NOTE — Telephone Encounter (Signed)
Would like to  know if CT Scan results are back? Cardioversion is scheduled for Wednesday.

## 2013-07-25 NOTE — Telephone Encounter (Signed)
Note printed and placed on cart for review today.

## 2013-07-25 NOTE — Telephone Encounter (Signed)
Pt called agaion-says he hopes he hears something today.Said he needed an antibiotic.

## 2013-07-25 NOTE — Telephone Encounter (Signed)
Returned call.  Pt asked per CT result note if he has been feeling sick.  Pt c/o intermittent low grade fever from up to 101F.  Stated today it was 99.86F.  Pt also c/o dry cough w/ sharp pain in his head w/ the cough.  Denied change in SOB.  Pt/wife wanted to know if he will receive medication for pneumonia and if Dr. Tresa Endo will still do the procedure on Wednesday.  Pt/wife informed RN will notify Dr. Tresa Endo for further instructions.  Message forwarded to Dr. Tresa Endo.

## 2013-07-26 ENCOUNTER — Telehealth: Payer: Self-pay | Admitting: Cardiovascular Disease

## 2013-07-26 MED ORDER — DEXTROSE-NACL 5-0.45 % IV SOLN: INTRAVENOUS | Status: DC

## 2013-07-26 NOTE — Telephone Encounter (Signed)
Returned call to [place], pt's wife.  Stated she wanted to let Burna Mortimer know what abx pt's PCP put him on for pneumonia.  Informed RN can take that information.  Wife stated pt was started on the generic for Ceftin (cefuroxime) 500mg  twide a day for 10 days.  Stated if pt shouldn't be on this, then let her know and they will get something else.  Informed Dr. Pierre Bali, CMA will be notified.  Verbalized understanding.  Message forwarded to Dr. Pierre Bali, CMA.

## 2013-07-26 NOTE — Telephone Encounter (Signed)
Please call-wants to tell you about the medicine he is taking for pneumonia.

## 2013-07-27 ENCOUNTER — Ambulatory Visit (HOSPITAL_COMMUNITY): Admission: RE | Admit: 2013-07-27 | Payer: Medicare Other | Source: Ambulatory Visit | Admitting: Cardiovascular Disease

## 2013-07-27 ENCOUNTER — Encounter (HOSPITAL_COMMUNITY): Admission: RE | Payer: Self-pay | Source: Ambulatory Visit

## 2013-07-27 SURGERY — CARDIOVERSION
Anesthesia: Monitor Anesthesia Care

## 2013-07-27 NOTE — Telephone Encounter (Signed)
Said she was suppose to call you back today and telll you about Akia.

## 2013-07-29 ENCOUNTER — Encounter: Payer: Self-pay | Admitting: Cardiovascular Disease

## 2013-07-29 NOTE — Progress Notes (Signed)
Patient ID: Dylan York, male   DOB: 05-22-1926, 79 y.o.   MRN: [id number]      HPI: Dylan York, is a 79 y.o. male who presents to the office in followup of his recent development of atrial fibrillation.  Dylan York has a history of sick sinus syndrome in the past has felt episodes of atrial tachycardia as well as sinus bradycardia, ventricular bigeminy, and frequent PVCs he had ultimately stabilized with Bystolic when he was seen on 03/08/2013 atrial fibrillation of questionable duration. At that time I increased his Bystolic and his started anticoagulation with Eliquis 2.5 mg twice a day. An echo Doppler study was done on 03/30/2013 which revealed normal systolic function with an ejection fraction of 60-65%, aortic valve sclerosis, biatrial enlargement, as well as mild pulmonary hypertension with a PA pressure of 41 mm. He was seen in followup on 04/04/2013 and was in AF 77 beats per minute. After much discussion with the restoration of sinus rhythm and antiarrhythmic therapy alternatives, I initially started him on Multaq 400 milligrams twice a day. He did not tolerate this and ultimately discontinue its use.  When  l saw him on 04/26/2013 after much discussion I started him on amiodarone 200 mg daily. He has tolerated this well. 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 When I last saw on 05/27/2013 I. further titrated his amiodarone to 200 mg twice a day. I also recommended that he reduce his Bystolic from 10-5 mg. He presents now for followup evaluation. Dylan York denies chest pain. He denies presyncope. He denies increasing shortness of breath admits that he was a little more fatigued he had been previously.   Past medical history is notable for hypertension, BPH, TURP.  Allergies  Allergen Reactions  . Multaq [Dronedarone] Shortness Of Breath    Current Outpatient Prescriptions    Medication Sig Dispense Refill  . amiodarone (PACERONE) 200 MG tablet Take 1 tablet (200 mg total) by mouth 2 (two) times daily.  60 tablet  6  . apixaban (ELIQUIS) 2.5 MG TABS tablet Take by mouth 2 (two) times daily.      . benazepril (LOTENSIN) 40 MG tablet Take 40 mg by mouth daily.      . finasteride (PROSCAR) 5 MG tablet Take 5 mg by mouth daily.      . hydrochlorothiazide (HYDRODIURIL) 12.5 MG tablet Take 12.5 mg by mouth daily.      . Multiple Vitamins-Minerals (MULTIVITAL PO) Take by mouth daily.      . nebivolol (BYSTOLIC) 5 MG tablet Take 5 mg by mouth daily.       Marland Kitchen omeprazole (PRILOSEC) 20 MG capsule TAKE 1 CAPSULE BY MOUTH 30 MINUTES BEFORE BREAKFAST EMERGENCY REFILL FAXED DR  90 capsule  11  . TRAVATAN Z 0.004 % SOLN ophthalmic solution Place 1 drop into both eyes daily.      Marland Kitchen VITAMIN E PO Take by mouth daily.      Marland Kitchen apixaban (ELIQUIS) 2.5 MG TABS tablet Take 1 tablet (2.5 mg total) by mouth 2 (two) times daily.  56 tablet  0   No current facility-administered medications for this visit.    Socially he is married and has 3 children he quit tobacco in 1978. 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  ROS is negative for fevers, chills or night  sweats. He denies rash. There is no wheezing. He denies recent tachycardia palpitations. There is no presyncope. He denies recent reflux he denies any heart failure. He denies syncope. He denies bleeding. He denies abdominal pain. Denies paresthesias.   Other system review is negative.  PE BP 130/80  Pulse 65  Ht 5\' 8"  (1.727 m)  Wt 160 lb (72.576 kg)  BMI 24.33 kg/m2  General: Alert, oriented, no distress.  Skin: normal turgor, no rashes HEENT: Normocephalic, atraumatic. Pupils round and reactive; sclera anicteric;no lid lag.  Nose without nasal septal hypertrophy Mouth/Parynx benign; Mallinpatti scale 3 Neck: No JVD, no carotid briuts Lungs: clear to ausculatation and percussion; no  wheezing or rales Heart:  irregular rhythm with a ventricular rate approximately  65;  no s3. 1/6 sem Abdomen: soft, nontender; no hepatosplenomehaly, BS+; abdominal aorta nontender and not dilated by palpation. Pulses 2+ Extremities: no clubbing cyanosis or edema, Homan's sign negative  Neurologic: grossly nonfocal  ECG: Atrial fibrillation at 65 beats per minute with nonspecific ST-T changes. QTc  interval 461 ms  LABS:  BMET    Component Value Date/Time   NA 135 07/21/2013 1126   K 3.7 07/21/2013 1126   CL 97 07/21/2013 1126   CO2 28 07/21/2013 1126   GLUCOSE 103* 07/21/2013 1126   BUN 14 07/21/2013 1126   CREATININE 1.16 07/21/2013 1126   CREATININE 1.20 12/24/2010 1122   CALCIUM 9.0 07/21/2013 1126   GFRNONAA 58* 12/24/2010 1122   GFRAA  Value: >60        The eGFR has been calculated using the MDRD equation. This calculation has not been validated in all clinical situations. eGFR's persistently <60 mL/min signify possible Chronic Kidney Disease. 12/24/2010 1122     Hepatic Function Panel     Component Value Date/Time   PROT 6.3 07/21/2013 1126     CBC    Component Value Date/Time   WBC 8.5 07/21/2013 1126   RBC 4.46 07/21/2013 1126   HGB 14.4 07/21/2013 1126   HCT 41.8 07/21/2013 1126   PLT 167 07/21/2013 1126   MCV 93.7 07/21/2013 1126   MCH 32.3 07/21/2013 1126   MCHC 34.4 07/21/2013 1126   RDW 14.4 07/21/2013 1126     BNP No results found for this basename: probnp    Lipid Panel     Component Value Date/Time   CHOL 162 04/06/2013 0850   TRIG 84 04/06/2013 0850   HDL 53 04/06/2013 0850   CHOLHDL 3.1 04/06/2013 0850   VLDL 17 04/06/2013 0850   LDLCALC 92 04/06/2013 0850     RADIOLOGY: No results found.    ASSESSMENT AND PLAN: Dylan York continues to be in persistent atrial fibrillation rate of at least several months duration. He has been on and tie coagulation since 03/08/2013. He did not convert with increased beta blocker therapy or a trial of Multaq.  He has tolerated  initiation of amiodarone at 200 mg daily and further titration to 200 mg twice a day.. EKG today continues to show atrial fibrillation with a controlled ventricular response. He is unaware of breakthrough tachycardia palpitations. At this point, we discussed consideration for attempt at cardioversion.  Discussed the risk benefits of this procedure. Plans will be made for this procedure be done in several weeks due to scheduling conflict. Prior to his outpatient procedure he will under go followup laboratory as well as a chest x-ray and is stable we'll plan to perform cardioversion shortly thereafter.     Amoria Mclees A.  Tresa Endo, MD, [facility]  07/29/2013 7:09 PM

## 2013-08-03 ENCOUNTER — Telehealth: Payer: Self-pay | Admitting: Cardiovascular Disease

## 2013-08-03 NOTE — Telephone Encounter (Signed)
Would like to speak to someone about a blood pressure that primary prescribe... Has some questions because he is already on a bp pill.Marland KitchenMarland KitchenMarland KitchenPlease call    Thanks

## 2013-08-03 NOTE — Telephone Encounter (Signed)
Returned call to [place], pt's wife.  Stated pt seen by PCP today for nausea and BP was elevated, 170/80.  Stated pt was given a Rx to start Norvasc 5 mg daily and she doesn't want to fill it unless it's okay w/ Dr. Landry Dyke office.  Stated pt also given a prescription for lorazepam 1 mg 1/2 to 1 tab at night and she doesn't want pt to take anything like that b/c she is nervous about sleeping pills.  Also stated pt lost 6lbs in the last 3 days.  Informed Belenda Cruise, PharmD will be notified for advice and RN will call her back.  Verbalized understanding.  Belenda Cruise, PharmD notified and advised pt not take Norvasc and increase Bystolic to 10 mg daily today and tomorrow.  Also advised pt/wife watch HR and call back if no improvement after 5 days for appt w/ an Extender.  Wife informed and verbalized understanding.  Also advised to give pt clear liquids or ice chips.  Wife advised to call back on Monday or sooner if needed for appt.  Verbalized understanding and agreed w/ plan.

## 2013-08-05 ENCOUNTER — Encounter (HOSPITAL_COMMUNITY): Payer: Self-pay | Admitting: *Deleted

## 2013-08-05 ENCOUNTER — Inpatient Hospital Stay (HOSPITAL_COMMUNITY): Payer: Medicare Other

## 2013-08-05 ENCOUNTER — Ambulatory Visit (INDEPENDENT_AMBULATORY_CARE_PROVIDER_SITE_OTHER): Payer: Medicare Other | Admitting: Physician Assistant

## 2013-08-05 ENCOUNTER — Telehealth: Payer: Self-pay | Admitting: Cardiovascular Disease

## 2013-08-05 ENCOUNTER — Inpatient Hospital Stay (HOSPITAL_COMMUNITY)
Admission: AD | Admit: 2013-08-05 | Discharge: 2013-08-09 | DRG: 305 | Disposition: A | Discharge status: Home / Self Care | Payer: Medicare Other | Source: Ambulatory Visit | Attending: Cardiovascular Disease | Admitting: Cardiovascular Disease

## 2013-08-05 ENCOUNTER — Encounter: Payer: Self-pay | Admitting: Physician Assistant

## 2013-08-05 VITALS — BP 200/98 | HR 47 | Ht 68.0 in | Wt 158.0 lb

## 2013-08-05 DIAGNOSIS — I119 Hypertensive heart disease without heart failure: Secondary | ICD-10-CM

## 2013-08-05 DIAGNOSIS — I169 Hypertensive crisis, unspecified: Secondary | ICD-10-CM

## 2013-08-05 DIAGNOSIS — I480 Paroxysmal atrial fibrillation: Secondary | ICD-10-CM | POA: Diagnosis not present

## 2013-08-05 DIAGNOSIS — R0989 Other specified symptoms and signs involving the circulatory and respiratory systems: Secondary | ICD-10-CM | POA: Diagnosis present

## 2013-08-05 DIAGNOSIS — Z23 Encounter for immunization: Secondary | ICD-10-CM

## 2013-08-05 DIAGNOSIS — N400 Enlarged prostate without lower urinary tract symptoms: Secondary | ICD-10-CM | POA: Diagnosis present

## 2013-08-05 DIAGNOSIS — I495 Sick sinus syndrome: Secondary | ICD-10-CM

## 2013-08-05 DIAGNOSIS — Z7901 Long term (current) use of anticoagulants: Secondary | ICD-10-CM

## 2013-08-05 DIAGNOSIS — R001 Bradycardia, unspecified: Secondary | ICD-10-CM | POA: Diagnosis present

## 2013-08-05 DIAGNOSIS — I4891 Unspecified atrial fibrillation: Secondary | ICD-10-CM | POA: Diagnosis present

## 2013-08-05 DIAGNOSIS — I079 Rheumatic tricuspid valve disease, unspecified: Secondary | ICD-10-CM | POA: Diagnosis present

## 2013-08-05 DIAGNOSIS — K219 Gastro-esophageal reflux disease without esophagitis: Secondary | ICD-10-CM | POA: Diagnosis present

## 2013-08-05 DIAGNOSIS — Z79899 Other long term (current) drug therapy: Secondary | ICD-10-CM

## 2013-08-05 DIAGNOSIS — I2789 Other specified pulmonary heart diseases: Secondary | ICD-10-CM | POA: Diagnosis present

## 2013-08-05 DIAGNOSIS — I959 Hypotension, unspecified: Secondary | ICD-10-CM | POA: Diagnosis not present

## 2013-08-05 DIAGNOSIS — I10 Essential (primary) hypertension: Principal | ICD-10-CM

## 2013-08-05 HISTORY — DX: Bradycardia, unspecified: R00.1

## 2013-08-05 HISTORY — DX: Benign prostatic hyperplasia without lower urinary tract symptoms: N40.0

## 2013-08-05 HISTORY — DX: Pneumonia, unspecified organism: J18.9

## 2013-08-05 LAB — COMPREHENSIVE METABOLIC PANEL
ALT: 33 U/L (ref 0–53)
Albumin: 2.9 g/dL — ABNORMAL LOW (ref 3.5–5.2)
Alkaline Phosphatase: 74 U/L (ref 39–117)
Chloride: 99 mEq/L (ref 96–112)
GFR calc Af Amer: 74 mL/min — ABNORMAL LOW (ref >90)
GFR calc non Af Amer: 64 mL/min — ABNORMAL LOW (ref >90)
Glucose, Bld: 132 mg/dL — ABNORMAL HIGH (ref 70–99)
Potassium: 3.2 mEq/L — ABNORMAL LOW (ref 3.5–5.1)
Sodium: 135 mEq/L (ref 135–145)
Total Protein: 6 g/dL (ref 6.0–8.3)

## 2013-08-05 LAB — CBC WITH DIFFERENTIAL/PLATELET
Basophils Absolute: 0 10*3/uL (ref 0.0–0.1)
Basophils Relative: 0 % (ref 0–1)
Eosinophils Absolute: 0.1 10*3/uL (ref 0.0–0.7)
HCT: 37.3 % — ABNORMAL LOW (ref 39.0–52.0)
Hemoglobin: 13.1 g/dL (ref 13.0–17.0)
MCH: 31.8 pg (ref 26.0–34.0)
MCHC: 35.1 g/dL (ref 30.0–36.0)
Monocytes Absolute: 0.5 10*3/uL (ref 0.1–1.0)
Monocytes Relative: 7 % (ref 3–12)
Neutrophils Relative %: 74 % (ref 43–77)

## 2013-08-05 LAB — PROTIME-INR: Prothrombin Time: 14.3 seconds (ref 11.6–15.2)

## 2013-08-05 MED ORDER — WHITE PETROLATUM GEL
Status: AC
  Administered 2013-08-05: 0.2
  Filled 2013-08-05: qty 5

## 2013-08-05 MED ORDER — ACETAMINOPHEN 650 MG RE SUPP: 650.0000 mg | Freq: Four times a day (QID) | RECTAL | Status: DC | PRN

## 2013-08-05 MED ORDER — DOCUSATE SODIUM 100 MG PO CAPS
100.0000 mg | ORAL_CAPSULE | Freq: Two times a day (BID) | ORAL | Status: DC
  Administered 2013-08-05 – 2013-08-09 (×8): 100 mg via ORAL
  Filled 2013-08-05 (×9): qty 1

## 2013-08-05 MED ORDER — ACETAMINOPHEN 325 MG PO TABS
650.0000 mg | ORAL_TABLET | Freq: Four times a day (QID) | ORAL | Status: DC | PRN
  Administered 2013-08-06 – 2013-08-07 (×3): 650 mg via ORAL
  Filled 2013-08-05 (×2): qty 2

## 2013-08-05 MED ORDER — HYDROCHLOROTHIAZIDE 12.5 MG PO CAPS
12.5000 mg | ORAL_CAPSULE | Freq: Every day | ORAL | Status: DC
  Administered 2013-08-06 – 2013-08-07 (×2): 12.5 mg via ORAL
  Filled 2013-08-05 (×3): qty 1

## 2013-08-05 MED ORDER — ONDANSETRON HCL 4 MG/2ML IJ SOLN
4.0000 mg | Freq: Four times a day (QID) | INTRAMUSCULAR | Status: DC | PRN
  Administered 2013-08-07: 4 mg via INTRAVENOUS
  Filled 2013-08-05: qty 2

## 2013-08-05 MED ORDER — ALUM & MAG HYDROXIDE-SIMETH 200-200-20 MG/5ML PO SUSP: 30.0000 mL | Freq: Four times a day (QID) | ORAL | Status: DC | PRN

## 2013-08-05 MED ORDER — ASPIRIN EC 81 MG PO TBEC
81.0000 mg | DELAYED_RELEASE_TABLET | Freq: Every day | ORAL | Status: DC
  Administered 2013-08-05 – 2013-08-07 (×3): 81 mg via ORAL
  Filled 2013-08-05 (×4): qty 1

## 2013-08-05 MED ORDER — ZOLPIDEM TARTRATE 5 MG PO TABS: 5.0000 mg | ORAL_TABLET | Freq: Every evening | ORAL | Status: DC | PRN

## 2013-08-05 MED ORDER — MORPHINE SULFATE 2 MG/ML IJ SOLN: 2.0000 mg | INTRAMUSCULAR | Status: DC | PRN

## 2013-08-05 MED ORDER — PANTOPRAZOLE SODIUM 40 MG PO TBEC
40.0000 mg | DELAYED_RELEASE_TABLET | Freq: Every day | ORAL | Status: DC
  Administered 2013-08-06 – 2013-08-09 (×4): 40 mg via ORAL
  Filled 2013-08-05 (×4): qty 1

## 2013-08-05 MED ORDER — ONDANSETRON HCL 4 MG PO TABS: 4.0000 mg | ORAL_TABLET | Freq: Four times a day (QID) | ORAL | Status: DC | PRN

## 2013-08-05 MED ORDER — HYDRALAZINE HCL 20 MG/ML IJ SOLN
10.0000 mg | Freq: Four times a day (QID) | INTRAMUSCULAR | Status: DC | PRN
  Administered 2013-08-05 – 2013-08-07 (×2): 10 mg via INTRAVENOUS
  Filled 2013-08-05 (×2): qty 1

## 2013-08-05 MED ORDER — NON FORMULARY: 1.0000 [drp] | Freq: Every morning | Status: DC

## 2013-08-05 MED ORDER — AMIODARONE HCL 200 MG PO TABS
200.0000 mg | ORAL_TABLET | Freq: Every day | ORAL | Status: DC
  Administered 2013-08-06 – 2013-08-09 (×4): 200 mg via ORAL
  Filled 2013-08-05 (×4): qty 1

## 2013-08-05 MED ORDER — NICARDIPINE HCL IN NACL 20-0.86 MG/200ML-% IV SOLN
5.0000 mg/h | INTRAVENOUS | Status: DC
  Administered 2013-08-05: 5 mg/h via INTRAVENOUS
  Filled 2013-08-05 (×3): qty 200

## 2013-08-05 MED ORDER — HYDROCHLOROTHIAZIDE 25 MG PO TABS
12.5000 mg | ORAL_TABLET | Freq: Every day | ORAL | Status: DC
  Filled 2013-08-05: qty 0.5

## 2013-08-05 MED ORDER — LATANOPROST 0.005 % OP SOLN
1.0000 [drp] | Freq: Every day | OPHTHALMIC | Status: DC
  Filled 2013-08-05: qty 2.5

## 2013-08-05 MED ORDER — BENAZEPRIL HCL 40 MG PO TABS
40.0000 mg | ORAL_TABLET | Freq: Every day | ORAL | Status: DC
  Administered 2013-08-06 – 2013-08-09 (×4): 40 mg via ORAL
  Filled 2013-08-05 (×4): qty 1

## 2013-08-05 MED ORDER — INFLUENZA VAC SPLIT QUAD 0.5 ML IM SUSP
0.5000 mL | INTRAMUSCULAR | Status: AC
  Administered 2013-08-06: 0.5 mL via INTRAMUSCULAR
  Filled 2013-08-05: qty 0.5

## 2013-08-05 MED ORDER — HYDROCHLOROTHIAZIDE 25 MG PO TABS: 12.5000 mg | ORAL_TABLET | Freq: Every day | ORAL | Status: DC

## 2013-08-05 MED ORDER — ADULT MULTIVITAMIN W/MINERALS CH
1.0000 | ORAL_TABLET | Freq: Every morning | ORAL | Status: DC
  Administered 2013-08-06 – 2013-08-09 (×4): 1 via ORAL
  Filled 2013-08-05 (×4): qty 1

## 2013-08-05 MED ORDER — SODIUM CHLORIDE 0.9 % IJ SOLN
3.0000 mL | Freq: Two times a day (BID) | INTRAMUSCULAR | Status: DC
  Administered 2013-08-05 – 2013-08-08 (×7): 3 mL via INTRAVENOUS

## 2013-08-05 MED ORDER — FINASTERIDE 5 MG PO TABS
5.0000 mg | ORAL_TABLET | Freq: Every day | ORAL | Status: DC
  Administered 2013-08-05 – 2013-08-09 (×5): 5 mg via ORAL
  Filled 2013-08-05 (×5): qty 1

## 2013-08-05 MED ORDER — PNEUMOCOCCAL VAC POLYVALENT 25 MCG/0.5ML IJ INJ
0.5000 mL | INJECTION | INTRAMUSCULAR | Status: AC
Start: 2013-08-06 — End: 2013-08-06
  Administered 2013-08-06: 0.5 mL via INTRAMUSCULAR
  Filled 2013-08-05: qty 0.5

## 2013-08-05 MED ORDER — SODIUM CHLORIDE 0.9 % IV SOLN
INTRAVENOUS | Status: DC
  Administered 2013-08-05: 20:00:00 via INTRAVENOUS

## 2013-08-05 NOTE — Addendum Note (Signed)
Addended by: Dwana Melena on: 08/05/2013 06:28 PM   Modules accepted: Level of Service

## 2013-08-05 NOTE — Progress Notes (Signed)
Date:  08/05/2013   ID:  Dylan York, DOB 1926-07-04, MRN [id number]  PCP:  Ignatius Specking., MD  Primary Cardiologist:  [facility]    History of Present Illness: Dylan York is a 79 y.o. male who presents to the office in followup of his recent development of atrial fibrillation. Mr. Holland has a history of sick sinus syndrome in the past has felt episodes of atrial tachycardia as well as sinus bradycardia, ventricular bigeminy, and frequent PVCs he had ultimately stabilized with Bystolic when he was seen on 03/08/2013 atrial fibrillation of questionable duration. At that time I increased his Bystolic and his started anticoagulation with Eliquis 2.5 mg twice a day. An echo Doppler study was done on 03/30/2013 which revealed normal systolic function with an ejection fraction of 60-65%, aortic valve sclerosis, biatrial enlargement, as well as mild pulmonary hypertension with a PA pressure of 41 mm. He was seen in followup on 04/04/2013 and was in AF 77 beats per minute. After much discussion with the restoration of sinus rhythm and antiarrhythmic therapy alternatives, Dr. Tresa Endo initially started him on Multaq 400 milligrams twice a day. He did not tolerate this and ultimately discontinue its use. When he saw him on 04/26/2013 after much discussion, he started him on amiodarone 200 mg daily. He has tolerated this well. 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 When I last saw on 05/27/2013 I. further titrated his amiodarone to 200 mg twice a day. Dr. Tresa Endo also recommended that he reduce his Bystolic from 10-5 mg. He presents now for followup evaluation.   Patient has a reported today with uncontrolled blood pressure. He also reports some double vision which is intermittent. Today in the office his pressure is 220/100.  He currently denies nausea, vomiting, fever, chest pain, shortness of breath,  orthopnea, dizziness, PND, cough, congestion, abdominal pain, hematochezia, melena, lower extremity edema, claudication.  Wt Readings from Last 3 Encounters:  08/05/13 158 lb (71.668 kg)  06/29/13 160 lb (72.576 kg)  05/27/13 162 lb 9.6 oz (73.755 kg)     Past Medical History  Diagnosis Date  . Atrial tachycardia   . Hypertension   . Tricuspid regurgitation   . GERD (gastroesophageal reflux disease)     No current outpatient prescriptions on file.   No current facility-administered medications for this visit.     Medication List       This list is accurate as of: 08/05/13  4:15 PM.  Always use your most recent med list.               amiodarone 200 MG tablet  Commonly known as:  PACERONE  Take 1 tablet (200 mg total) by mouth 2 (two) times daily.     benazepril 40 MG tablet  Commonly known as:  LOTENSIN  Take 40 mg by mouth daily.     ELIQUIS 2.5 MG Tabs tablet  Generic drug:  apixaban  Take by mouth 2 (two) times daily.     apixaban 2.5 MG Tabs tablet  Commonly known as:  ELIQUIS  Take 1 tablet (2.5 mg total) by mouth 2 (two) times daily.     finasteride 5 MG tablet  Commonly known as:  PROSCAR  Take 5 mg by mouth daily.     hydrochlorothiazide 12.5 MG tablet  Commonly known as:  HYDRODIURIL  Take 12.5 mg by mouth daily.  MULTIVITAL PO  Take by mouth daily.     nebivolol 5 MG tablet  Commonly known as:  BYSTOLIC  Take 5 mg by mouth daily.     omeprazole 20 MG capsule  Commonly known as:  PRILOSEC  TAKE 1 CAPSULE BY MOUTH 30 MINUTES BEFORE BREAKFAST EMERGENCY REFILL FAXED DR     TRAVATAN Z 0.004 % Soln ophthalmic solution  Generic drug:  Travoprost (BAK Free)  Place 1 drop into both eyes daily.     VITAMIN E PO  Take by mouth daily.         Allergies:    Allergies  Allergen Reactions  . Multaq [Dronedarone] Shortness Of Breath    Social History:  The patient  reports that he quit smoking about 36 years ago. He does not have any  smokeless tobacco history on file. He reports that he does not drink alcohol or use illicit drugs.   Family history:   Family History  Problem Relation Age of Onset  . Cancer Mother   . Heart Problems Father     atherosclerosis  . Cancer - Prostate Paternal Grandfather     ROS:  Please see the history of present illness.  All other systems reviewed and negative.   PHYSICAL EXAM: VS:  BP 200/98  Pulse 47  Ht 5\' 8"  (1.727 m)  Wt 158 lb (71.668 kg)  BMI 24.03 kg/m2 Well nourished, well developed, in no acute distress HEENT: Pupils are equal round react to light accommodation extraocular movements are intact.  Neck: no JVDNo cervical lymphadenopathy. Cardiac:  rate slow and rhythm without murmurs rubs or gallops. Lungs:  clear to auscultation bilaterally, no wheezing, rhonchi or rales Abd: soft, nontender, positive bowel sounds all quadrants, no hepatosplenomegaly Ext: no lower extremity edema.  2+ radial and dorsalis pedis pulses. Skin: warm and dry Neuro:  Grossly normal.  Cranial nerves intact  EKG:  Sinus Bradycardia  ASSESSMENT AND PLAN:  Problem List Items Addressed This Visit   Hypertensive crisis - Primary     Will admit to [facility] for bp control.  Will start with IV hydralazine and home meds.  He is bradycardia in the 40's now so avoid any new beta blockers.

## 2013-08-05 NOTE — Telephone Encounter (Signed)
Having BP running higher than normal  Went to see primary care on 1st  Was given new rx and wants to make sure ok to take and having some other issues sleeping  Please call

## 2013-08-05 NOTE — Telephone Encounter (Signed)
Spoke to patient . He states his blood pressure is still elevated for more than week. He received information on his blood pressure  2 days ago form our office . He states at his PCP on Tuesday 155/88 p- 45.; yesterday at the drugstore b/p 189/87  At home 149/76  ,avg 165/82  At home Today  7:15 am 190/87  Prior to medication,and about 5 min prior to phone call and after taking medication 213/98  He took 10 mg of Bystolic 10 mg 08/03/13 and 08/04/13.   Discussed with patient about his blood pressures taking it  to frequent and due to elevations appointment was made today to see a extender at 3 pm   Instructed him to bring blood pressure cuff along with medication to appointment he also had a  question whether  To start Norvasc as PCP had prescribed earlier in the week. Informed patient that should be discussed at the appointment today.

## 2013-08-05 NOTE — Addendum Note (Signed)
Addended by: Dwana Melena on: 08/05/2013 06:27 PM   Modules accepted: Level of Service

## 2013-08-05 NOTE — Assessment & Plan Note (Signed)
Will admit to [facility] for bp control.  Will start with IV hydralazine and home meds.  He is bradycardia in the 40's now so avoid any new beta blockers.

## 2013-08-05 NOTE — H&P (Addendum)
Date:  08/07/2013   ID:  Dylan York, DOB 1926/03/13, MRN [id number]  PCP:  Ignatius Specking., MD  Primary Cardiologist:  [facility]bo    History of Present Illness: Dylan York is a 79 y.o. male who presents to the office in followup of his recent development of atrial fibrillation. [name] has a history of sick sinus syndrome in the past has felt episodes of atrial tachycardia as well as sinus bradycardia, ventricular bigeminy, and frequent PVCs he had ultimately stabilized with Bystolic when he was seen on 03/08/2013 atrial fibrillation of questionable duration. At that time I increased his Bystolic and his started anticoagulation with Eliquis 2.5 mg twice a day. An echo Doppler study was done on 03/30/2013 which revealed normal systolic function with an ejection fraction of 60-65%, aortic valve sclerosis, biatrial enlargement, as well as mild pulmonary hypertension with a PA pressure of 41 mm. He was seen in followup on 04/04/2013 and was in AF 77 beats per minute. After much discussion with the restoration of sinus rhythm and antiarrhythmic therapy alternatives, Dr. Tresa Endo initially started him on Multaq 400 milligrams twice a day. He did not tolerate this and ultimately discontinue its use. When he saw him on 04/26/2013 after much discussion, he started him on amiodarone 200 mg daily. He has tolerated this well. 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 When I last saw on 05/27/2013 I. further titrated his amiodarone to 200 mg twice a day. Dr. Tresa Endo also recommended that he reduce his Bystolic from 10-5 mg. He presents now for followup evaluation.   Patient has a reported today with uncontrolled blood pressure. He also reports some double vision which is intermittent. Today in the office his pressure is 220/100.  He currently denies nausea, vomiting, fever, chest pain, shortness of breath,  orthopnea, dizziness, PND, cough, congestion, abdominal pain, hematochezia, melena, lower extremity edema, claudication.  Wt Readings from Last 3 Encounters:  08/05/13 158 lb 1.1 oz (71.7 kg)  08/05/13 158 lb (71.668 kg)  06/29/13 160 lb (72.576 kg)     Past Medical History  Diagnosis Date  . Hypertension   . Tricuspid regurgitation   . GERD (gastroesophageal reflux disease)   . Atrial tachycardia   . Pneumonia        BPH   Current Facility-Administered Medications  Medication Dose Route Frequency Provider Last Rate Last Dose  . 0.9 %  sodium chloride infusion   Intravenous Continuous Nada Boozer, NP      . acetaminophen (TYLENOL) tablet 650 mg  650 mg Oral Q6H PRN Nada Boozer, NP   650 mg at 08/07/13 4098   Or  . acetaminophen (TYLENOL) suppository 650 mg  650 mg Rectal Q6H PRN Nada Boozer, NP      . alum & mag hydroxide-simeth (MAALOX/MYLANTA) 200-200-20 MG/5ML suspension 30 mL  30 mL Oral Q6H PRN Nada Boozer, NP      . amiodarone (PACERONE) tablet 200 mg  200 mg Oral Daily Nada Boozer, NP   200 mg at 08/06/13 1107  . amLODipine (NORVASC) tablet 5 mg  5 mg Oral BID Chrystie Nose, MD      . aspirin EC tablet 81 mg  81 mg Oral Daily Nada Boozer, NP   81 mg at 08/06/13 1107  . benazepril (LOTENSIN) tablet 40 mg  40 mg Oral Daily Nada Boozer, NP   40  mg at 08/06/13 1108  . docusate sodium (COLACE) capsule 100 mg  100 mg Oral BID Nada Boozer, NP   100 mg at 08/06/13 2100  . doxazosin (CARDURA) tablet 4 mg  4 mg Oral Daily Chrystie Nose, MD      . finasteride (PROSCAR) tablet 5 mg  5 mg Oral Daily Nada Boozer, NP   5 mg at 08/06/13 1107  . hydrALAZINE (APRESOLINE) injection 10 mg  10 mg Intravenous Q6H PRN Nada Boozer, NP   10 mg at 08/07/13 1191  . hydrALAZINE (APRESOLINE) tablet 25 mg  25 mg Oral Q8H Bryan Hager, PA-C   25 mg at 08/07/13 0509  . hydrochlorothiazide (MICROZIDE) capsule 12.5 mg  12.5 mg Oral Daily Lennette Bihari, MD   12.5 mg at 08/06/13 1108  .  latanoprost (XALATAN) 0.005 % ophthalmic solution 1 drop  1 drop Both Eyes Daily Lennette Bihari, MD   1 drop at 08/06/13 2105  . morphine 2 MG/ML injection 2 mg  2 mg Intravenous Q2H PRN Nada Boozer, NP      . multivitamin with minerals tablet 1 tablet  1 tablet Oral q morning - 10a Nada Boozer, NP   1 tablet at 08/06/13 1107  . ondansetron (ZOFRAN) tablet 4 mg  4 mg Oral Q6H PRN Nada Boozer, NP       Or  . ondansetron [facility]) injection 4 mg  4 mg Intravenous Q6H PRN Nada Boozer, NP      . pantoprazole (PROTONIX) EC tablet 40 mg  40 mg Oral Daily Nada Boozer, NP   40 mg at 08/06/13 1111  . sodium chloride 0.9 % injection 3 mL  3 mL Intravenous Q12H Nada Boozer, NP   3 mL at 08/06/13 2101  . white petrolatum (VASELINE) gel   Topical Daily Lennette Bihari, MD      . zolpidem [facility]) tablet 5 mg  5 mg Oral QHS PRN Nada Boozer, NP         Medication List    ASK your doctor about these medications       amiodarone 200 MG tablet  Commonly known as:  PACERONE  Take 1 tablet (200 mg total) by mouth 2 (two) times daily.     benazepril 40 MG tablet  Commonly known as:  LOTENSIN  Take 40 mg by mouth daily.     cefUROXime 500 MG tablet  Commonly known as:  CEFTIN  Take 500 mg by mouth 2 (two) times daily.     ELIQUIS 2.5 MG Tabs tablet  Generic drug:  apixaban  Take 2.5 mg by mouth 2 (two) times daily.     finasteride 5 MG tablet  Commonly known as:  PROSCAR  Take 5 mg by mouth daily.     hydrochlorothiazide 12.5 MG tablet  Commonly known as:  HYDRODIURIL  Take 12.5 mg by mouth daily.     MULTIVITAL PO  Take 1 tablet by mouth daily.     nebivolol 5 MG tablet  Commonly known as:  BYSTOLIC  Take 5 mg by mouth daily.     omeprazole 20 MG capsule  Commonly known as:  PRILOSEC  Take 20 mg by mouth daily before breakfast.     TRAVATAN Z 0.004 % Soln ophthalmic solution  Generic drug:  Travoprost (BAK Free)  Place 1 drop into both eyes daily.     VITAMIN E PO  Take 1  tablet by mouth daily.  Allergies:    Allergies  Allergen Reactions  . Multaq [Dronedarone] Shortness Of Breath    Social History:  The patient  reports that he quit smoking about 36 years ago. He does not have any smokeless tobacco history on file. He reports that he does not drink alcohol or use illicit drugs.   Family history:   Family History  Problem Relation Age of Onset  . Cancer Mother   . Heart Problems Father     atherosclerosis  . Cancer - Prostate Paternal Grandfather     ROS:  Please see the history of present illness.  All other systems reviewed and negative.   PHYSICAL EXAM: VS:  BP 173/59  Pulse 54  Temp(Src) 98.2 F (36.8 C) (Oral)  Resp 16  Ht 5\' 8"  (1.727 m)  Wt 158 lb 1.1 oz (71.7 kg)  BMI 24.04 kg/m2  SpO2 100% GEN: Well nourished, well developed, in no acute distress HEENT: Pupils are equal round react to light accommodation extraocular movements are intact.  Neck: no JVDNo cervical lymphadenopathy. Cardiac:  rate slow and rhythm without murmurs rubs or gallops. Lungs:  clear to auscultation bilaterally, no wheezing, rhonchi or rales Abd: soft, nontender, positive bowel sounds all quadrants, no hepatosplenomegaly Ext: no lower extremity edema.  2+ radial and dorsalis pedis pulses. Skin: warm and dry Neuro:  Grossly normal.  Cranial nerves intact Psych: Mood, affect pleasant, normal  EKG:  Sinus Bradycardia at 47  ASSESSMENT AND PLAN:  Problem List Items Addressed This Visit   AF (atrial fibrillation)   Relevant Medications      aspirin EC tablet 81 mg      amiodarone (PACERONE) tablet 200 mg      benazepril (LOTENSIN) tablet 40 mg      hydrALAZINE (APRESOLINE) injection 10 mg      hydrochlorothiazide (MICROZIDE) capsule 12.5 mg      hydrALAZINE (APRESOLINE) tablet 25 mg      amLODipine (NORVASC) tablet 5 mg (Start on 08/07/2013 10:00 AM)      doxazosin (CARDURA) tablet 4 mg (Start on 08/07/2013 10:00 AM)   Sick sinus syndrome    Relevant Medications      aspirin EC tablet 81 mg      amiodarone (PACERONE) tablet 200 mg      benazepril (LOTENSIN) tablet 40 mg      hydrALAZINE (APRESOLINE) injection 10 mg      hydrochlorothiazide (MICROZIDE) capsule 12.5 mg      hydrALAZINE (APRESOLINE) tablet 25 mg      amLODipine (NORVASC) tablet 5 mg (Start on 08/07/2013 10:00 AM)      doxazosin (CARDURA) tablet 4 mg (Start on 08/07/2013 10:00 AM)   Hypertensive crisis - Primary   Relevant Medications      aspirin EC tablet 81 mg      amiodarone (PACERONE) tablet 200 mg      benazepril (LOTENSIN) tablet 40 mg      hydrALAZINE (APRESOLINE) injection 10 mg      hydrochlorothiazide (MICROZIDE) capsule 12.5 mg      hydrALAZINE (APRESOLINE) tablet 25 mg      amLODipine (NORVASC) tablet 5 mg (Start on 08/07/2013 10:00 AM)      doxazosin (CARDURA) tablet 4 mg (Start on 08/07/2013 10:00 AM)     Pt. Seen and examined. Agree with the NP/PA-C note as written.  Hypertensive emergency. He will likely need IV Cardene for blood pressure control. Goal to reduce 25-30% in the first 24 hours. Hold bystolic. Decrease amiodarone  to 200 mg daily. Transfer to 2900 stepdown.   Chrystie Nose, MD, [facility] Attending Cardiologist [facility]

## 2013-08-05 NOTE — Progress Notes (Signed)
I saw Mr. Khachatryan.  He does have a headache when started after he received 2 nitroglycerin, but was not present prior to that time when his blood pressure was equally as high. He is not complaining of chest pain. I do not think he is appropriate for 3W - he has a history of recent PAF and is on medications which require closer monitoring. I believe we'll achieve better BP control with continue IV medication (Cardene). I would start this with a goal to reduce systolic bp 25-30% (150-180 systolic) for the first 24 hours. I would hold bystolic for now and reduce amiodarone to 200 mg daily given his bradycardia. We can use prn hydralazine q6hrs for SBP >180 if on max dose cardene.  Plan transfer to 2900 stepdown.  Chrystie Nose, MD, [facility] Attending Cardiologist [facility]

## 2013-08-05 NOTE — Patient Instructions (Signed)
Proceed to [facility] Admitting.

## 2013-08-06 LAB — CBC
HCT: 36.9 % — ABNORMAL LOW (ref 39.0–52.0)
Hemoglobin: 13.3 g/dL (ref 13.0–17.0)
MCH: 32.4 pg (ref 26.0–34.0)
MCV: 90 fL (ref 78.0–100.0)
Platelets: 229 10*3/uL (ref 150–400)
RBC: 4.1 MIL/uL — ABNORMAL LOW (ref 4.22–5.81)
RDW: 12.9 % (ref 11.5–15.5)
WBC: 6.6 10*3/uL (ref 4.0–10.5)

## 2013-08-06 LAB — BASIC METABOLIC PANEL
CO2: 23 mEq/L (ref 19–32)
Calcium: 8.3 mg/dL — ABNORMAL LOW (ref 8.4–10.5)
Chloride: 99 mEq/L (ref 96–112)
GFR calc Af Amer: 86 mL/min — ABNORMAL LOW (ref >90)
Glucose, Bld: 97 mg/dL (ref 70–99)
Potassium: 3.6 mEq/L (ref 3.5–5.1)
Sodium: 133 mEq/L — ABNORMAL LOW (ref 135–145)

## 2013-08-06 LAB — TSH: TSH: 3.52 u[IU]/mL (ref 0.350–4.500)

## 2013-08-06 MED ORDER — HYDRALAZINE HCL 25 MG PO TABS
25.0000 mg | ORAL_TABLET | Freq: Three times a day (TID) | ORAL | Status: DC
  Administered 2013-08-06 – 2013-08-09 (×9): 25 mg via ORAL
  Filled 2013-08-06 (×13): qty 1

## 2013-08-06 MED ORDER — WHITE PETROLATUM GEL
Freq: Every day | Status: DC
  Administered 2013-08-07: 0.2 via TOPICAL
  Administered 2013-08-08: 10:00:00 via TOPICAL
  Filled 2013-08-06: qty 28.35
  Filled 2013-08-06: qty 5
  Filled 2013-08-06: qty 28.35

## 2013-08-06 MED ORDER — LATANOPROST 0.005 % OP SOLN
1.0000 [drp] | Freq: Every day | OPHTHALMIC | Status: DC
  Administered 2013-08-06 – 2013-08-08 (×3): 1 [drp] via OPHTHALMIC
  Filled 2013-08-06: qty 2.5

## 2013-08-06 MED ORDER — AMLODIPINE BESYLATE 5 MG PO TABS
5.0000 mg | ORAL_TABLET | Freq: Every day | ORAL | Status: DC
  Administered 2013-08-06: 5 mg via ORAL
  Filled 2013-08-06 (×2): qty 1

## 2013-08-06 NOTE — Progress Notes (Signed)
Pt. Seen and examined. Agree with the NP/PA-C note as written.  Excellent response to hydralazine and cardene overnight. Infusion had to be held. He is now back on cardene as BP has rebounded. Agree with starting po hydralazine today and wean down cardene accordingly. His response to the cardene suggests that he a calcium channel blocker such as amlodipine would be beneficial.    Chrystie Nose, MD, [facility] Attending Cardiologist [facility]

## 2013-08-06 NOTE — Progress Notes (Signed)
Subjective: Reports having to take a couple deep breaths after laying down.  Objective: Vital signs in last 24 hours: Temp:  [97.5 F (36.4 C)-98.1 F (36.7 C)] 97.5 F (36.4 C) (10/04 0740) Pulse Rate:  [47] 47 (10/03 1651) Resp:  [16-20] 18 (10/04 0400) BP: (95-253)/(39-100) 195/78 mmHg (10/04 0700) SpO2:  [95 %-99 %] 96 % (10/04 0740) Weight:  [158 lb (71.668 kg)-158 lb 1.1 oz (71.7 kg)] 158 lb 1.1 oz (71.7 kg) (10/03 2000) Last BM Date: 08/05/13  Intake/Output from previous day: 10/03 0701 - 10/04 0700 In: 377.2 [P.O.:240; I.V.:137.2] Out: 800 [Urine:800] Intake/Output this shift: Total I/O In: -  Out: 350 [Urine:350]  Medications Current Facility-Administered Medications  Medication Dose Route Frequency Provider Last Rate Last Dose  . 0.9 %  sodium chloride infusion   Intravenous Continuous Nada Boozer, NP 10 mL/hr at 08/05/13 1942    . acetaminophen (TYLENOL) tablet 650 mg  650 mg Oral Q6H PRN Nada Boozer, NP   650 mg at 08/06/13 0408   Or  . acetaminophen (TYLENOL) suppository 650 mg  650 mg Rectal Q6H PRN Nada Boozer, NP      . alum & mag hydroxide-simeth (MAALOX/MYLANTA) 200-200-20 MG/5ML suspension 30 mL  30 mL Oral Q6H PRN Nada Boozer, NP      . amiodarone (PACERONE) tablet 200 mg  200 mg Oral Daily Nada Boozer, NP      . aspirin EC tablet 81 mg  81 mg Oral Daily Nada Boozer, NP   81 mg at 08/05/13 1744  . benazepril (LOTENSIN) tablet 40 mg  40 mg Oral Daily Nada Boozer, NP      . docusate sodium (COLACE) capsule 100 mg  100 mg Oral BID Nada Boozer, NP   100 mg at 08/05/13 2130  . finasteride (PROSCAR) tablet 5 mg  5 mg Oral Daily Nada Boozer, NP   5 mg at 08/05/13 2129  . hydrALAZINE (APRESOLINE) injection 10 mg  10 mg Intravenous Q6H PRN Nada Boozer, NP   10 mg at 08/05/13 1942  . hydrochlorothiazide (MICROZIDE) capsule 12.5 mg  12.5 mg Oral Daily Lennette Bihari, MD      . influenza vac split quadrivalent PF (FLUARIX) injection 0.5 mL  0.5 mL  Intramuscular Tomorrow-1000 Lennette Bihari, MD      . latanoprost (XALATAN) 0.005 % ophthalmic solution 1 drop  1 drop Both Eyes Daily Lennette Bihari, MD      . morphine 2 MG/ML injection 2 mg  2 mg Intravenous Q2H PRN Nada Boozer, NP      . multivitamin with minerals tablet 1 tablet  1 tablet Oral q morning - 10a Nada Boozer, NP      . niCARdipine (CARDENE-IV) infusion (0.1 mg/ml)  5 mg/hr Intravenous Continuous Nada Boozer, NP   2.5 mg/hr at 08/05/13 2236  . ondansetron (ZOFRAN) tablet 4 mg  4 mg Oral Q6H PRN Nada Boozer, NP       Or  . ondansetron [facility]) injection 4 mg  4 mg Intravenous Q6H PRN Nada Boozer, NP      . pantoprazole (PROTONIX) EC tablet 40 mg  40 mg Oral Daily Nada Boozer, NP      . pneumococcal 23 valent vaccine (PNU-IMMUNE) injection 0.5 mL  0.5 mL Intramuscular Tomorrow-1000 Lennette Bihari, MD      . sodium chloride 0.9 % injection 3 mL  3 mL Intravenous Q12H Nada Boozer, NP   3 mL at 08/05/13 2129  . zolpidem (  AMBIEN) tablet 5 mg  5 mg Oral QHS PRN Nada Boozer, NP        PE: General appearance: alert, cooperative and no distress Lungs: clear to auscultation bilaterally Heart: regular rate and rhythm, S1, S2 normal, no murmur, click, rub or gallop Extremities: No LEE Pulses: 2+ and symmetric Neurologic: Grossly normal  Lab Results:   Recent Labs  08/05/13 2101 08/06/13 0510  WBC 7.3 6.6  HGB 13.1 13.3  HCT 37.3* 36.9*  PLT 231 229   BMET  Recent Labs  08/05/13 2101 08/06/13 0510  NA 135 133*  K 3.2* 3.6  CL 99 99  CO2 22 23  GLUCOSE 132* 97  BUN 16 15  CREATININE 1.04 0.96  CALCIUM 8.4 8.3*   PT/INR  Recent Labs  08/05/13 2101  LABPROT 14.3  INR 1.13    Assessment/Plan  Active Problems:   Hypertensive crisis  Plan:  Nicardipine was stopped over night because BP dropped to 106/50.  Currently he is 227/83.  Restarting Nicardipine at 2.5mg /hr.  ? Starting hydralazine 25mg  TID then titrating.  Renal dopplers may be a good idea.       LOS: 1 day    Briggitte Boline 08/06/2013 7:43 AM

## 2013-08-06 NOTE — Progress Notes (Signed)
Brief Nutrition Note:   RD pulled to pt for positive malnutrition screening tool.  Discussed with pt, states appetite has been good. Has lost about 4 lbs in the last few days attributed to fluid weight.  UBW: 155-160 lbs  Current weight: 158 lbs.  Wt Readings from Last 6 Encounters:  08/05/13 158 lb 1.1 oz (71.7 kg)  08/05/13 158 lb (71.668 kg)  06/29/13 160 lb (72.576 kg)  05/27/13 162 lb 9.6 oz (73.755 kg)  04/26/13 160 lb 3.2 oz (72.666 kg)  04/04/13 161 lb 12.8 oz (73.392 kg)  Body mass index is 24.04 kg/(m^2). WNL Diet: Low Sodium  PO intake: 100% last meal    Chart reviewed, no nutrition interventions warranted at this time. Please consult as needed.   Isabell Jarvis RD, LDN Pager [phone] After Hours pager [phone]

## 2013-08-07 ENCOUNTER — Encounter (HOSPITAL_COMMUNITY): Payer: Self-pay | Admitting: Internal Medicine

## 2013-08-07 DIAGNOSIS — R0989 Other specified symptoms and signs involving the circulatory and respiratory systems: Secondary | ICD-10-CM | POA: Diagnosis present

## 2013-08-07 MED ORDER — AMLODIPINE BESYLATE 5 MG PO TABS
5.0000 mg | ORAL_TABLET | Freq: Two times a day (BID) | ORAL | Status: DC
  Administered 2013-08-07 – 2013-08-09 (×5): 5 mg via ORAL
  Filled 2013-08-07 (×6): qty 1

## 2013-08-07 MED ORDER — DOXAZOSIN MESYLATE 4 MG PO TABS
4.0000 mg | ORAL_TABLET | Freq: Every day | ORAL | Status: DC
  Administered 2013-08-07 – 2013-08-09 (×3): 4 mg via ORAL
  Filled 2013-08-07 (×3): qty 1

## 2013-08-07 NOTE — Progress Notes (Signed)
DAILY PROGRESS NOTE  Subjective:  Blood pressure continues to be labile - although has not displayed any end-organ effects. He had a marked decrease in blood pressure on cardene to the point of hypotension, then rebound hypertension. He continues to be bradycardic. This morning he had another blood pressure spike around 0600 which was >230 systolic.   Objective:  Temp:  [97.5 F (36.4 C)-98.5 F (36.9 C)] 98.5 F (36.9 C) (10/05 0400) Pulse Rate:  [48-71] 54 (10/05 0700) Resp:  [16-18] 16 (10/04 2200) BP: (89-235)/(29-85) 173/59 mmHg (10/05 0700) SpO2:  [90 %-100 %] 100 % (10/05 0700) Weight change:   Intake/Output from previous day: 10/04 0701 - 10/05 0700 In: 1302.5 [P.O.:1200; I.V.:102.5] Out: 2300 [Urine:2300]  Intake/Output from this shift:    Medications: Current Facility-Administered Medications  Medication Dose Route Frequency Provider Last Rate Last Dose  . 0.9 %  sodium chloride infusion   Intravenous Continuous Nada Boozer, NP      . acetaminophen (TYLENOL) tablet 650 mg  650 mg Oral Q6H PRN Nada Boozer, NP   650 mg at 08/07/13 4098   Or  . acetaminophen (TYLENOL) suppository 650 mg  650 mg Rectal Q6H PRN Nada Boozer, NP      . alum & mag hydroxide-simeth (MAALOX/MYLANTA) 200-200-20 MG/5ML suspension 30 mL  30 mL Oral Q6H PRN Nada Boozer, NP      . amiodarone (PACERONE) tablet 200 mg  200 mg Oral Daily Nada Boozer, NP   200 mg at 08/06/13 1107  . amLODipine (NORVASC) tablet 5 mg  5 mg Oral Daily Wilburt Finlay, PA-C   5 mg at 08/06/13 1108  . aspirin EC tablet 81 mg  81 mg Oral Daily Nada Boozer, NP   81 mg at 08/06/13 1107  . benazepril (LOTENSIN) tablet 40 mg  40 mg Oral Daily Nada Boozer, NP   40 mg at 08/06/13 1108  . docusate sodium (COLACE) capsule 100 mg  100 mg Oral BID Nada Boozer, NP   100 mg at 08/06/13 2100  . finasteride (PROSCAR) tablet 5 mg  5 mg Oral Daily Nada Boozer, NP   5 mg at 08/06/13 1107  . hydrALAZINE (APRESOLINE) injection 10 mg   10 mg Intravenous Q6H PRN Nada Boozer, NP   10 mg at 08/07/13 1191  . hydrALAZINE (APRESOLINE) tablet 25 mg  25 mg Oral Q8H Bryan Hager, PA-C   25 mg at 08/07/13 0509  . hydrochlorothiazide (MICROZIDE) capsule 12.5 mg  12.5 mg Oral Daily Lennette Bihari, MD   12.5 mg at 08/06/13 1108  . latanoprost (XALATAN) 0.005 % ophthalmic solution 1 drop  1 drop Both Eyes Daily Lennette Bihari, MD   1 drop at 08/06/13 2105  . morphine 2 MG/ML injection 2 mg  2 mg Intravenous Q2H PRN Nada Boozer, NP      . multivitamin with minerals tablet 1 tablet  1 tablet Oral q morning - 10a Nada Boozer, NP   1 tablet at 08/06/13 1107  . ondansetron (ZOFRAN) tablet 4 mg  4 mg Oral Q6H PRN Nada Boozer, NP       Or  . ondansetron [facility]) injection 4 mg  4 mg Intravenous Q6H PRN Nada Boozer, NP      . pantoprazole (PROTONIX) EC tablet 40 mg  40 mg Oral Daily Nada Boozer, NP   40 mg at 08/06/13 1111  . sodium chloride 0.9 % injection 3 mL  3 mL Intravenous Q12H Nada Boozer, NP   3  mL at 08/06/13 2101  . white petrolatum (VASELINE) gel   Topical Daily Lennette Bihari, MD      . zolpidem (AMBIEN) tablet 5 mg  5 mg Oral QHS PRN Nada Boozer, NP        Physical Exam: General appearance: alert and no distress Lungs: clear to auscultation bilaterally Heart: regular rate and rhythm, S1, S2 normal, no murmur, click, rub or gallop Extremities: extremities normal, atraumatic, no cyanosis or edema Neurologic: Grossly normal  Lab Results: Results for orders placed during the hospital encounter of 08/05/13 (from the past 48 hour(s))  MRSA PCR SCREENING     Status: None   Collection Time    08/05/13  6:38 PM      Result Value Range   MRSA by PCR NEGATIVE  NEGATIVE   Comment:            The GeneXpert MRSA Assay (FDA     approved for NASAL specimens     only), is one component of a     comprehensive MRSA colonization     surveillance program. It is not     intended to diagnose MRSA     infection nor to guide or      monitor treatment for     MRSA infections.  COMPREHENSIVE METABOLIC PANEL     Status: Abnormal   Collection Time    08/05/13  9:01 PM      Result Value Range   Sodium 135  135 - 145 mEq/L   Potassium 3.2 (*) 3.5 - 5.1 mEq/L   Chloride 99  96 - 112 mEq/L   CO2 22  19 - 32 mEq/L   Glucose, Bld 132 (*) 70 - 99 mg/dL   BUN 16  6 - 23 mg/dL   Creatinine, Ser 1.61  0.50 - 1.35 mg/dL   Calcium 8.4  8.4 - 09.6 mg/dL   Total Protein 6.0  6.0 - 8.3 g/dL   Albumin 2.9 (*) 3.5 - 5.2 g/dL   AST 28  0 - 37 U/L   ALT 33  0 - 53 U/L   Alkaline Phosphatase 74  39 - 117 U/L   Total Bilirubin 1.0  0.3 - 1.2 mg/dL   GFR calc non Af Amer 64 (*) >90 mL/min   GFR calc Af Amer 74 (*) >90 mL/min   Comment: (NOTE)     The eGFR has been calculated using the CKD EPI equation.     This calculation has not been validated in all clinical situations.     eGFR's persistently <90 mL/min signify possible Chronic Kidney     Disease.  MAGNESIUM     Status: None   Collection Time    08/05/13  9:01 PM      Result Value Range   Magnesium 1.9  1.5 - 2.5 mg/dL  CBC WITH DIFFERENTIAL     Status: Abnormal   Collection Time    08/05/13  9:01 PM      Result Value Range   WBC 7.3  4.0 - 10.5 K/uL   RBC 4.12 (*) 4.22 - 5.81 MIL/uL   Hemoglobin 13.1  13.0 - 17.0 g/dL   HCT 04.5 (*) 40.9 - 81.1 %   MCV 90.5  78.0 - 100.0 fL   MCH 31.8  26.0 - 34.0 pg   MCHC 35.1  30.0 - 36.0 g/dL   RDW 91.4  78.2 - 95.6 %   Platelets 231  150 - 400 K/uL  Neutrophils Relative % 74  43 - 77 %   Neutro Abs 5.4  1.7 - 7.7 K/uL   Lymphocytes Relative 18  12 - 46 %   Lymphs Abs 1.3  0.7 - 4.0 K/uL   Monocytes Relative 7  3 - 12 %   Monocytes Absolute 0.5  0.1 - 1.0 K/uL   Eosinophils Relative 1  0 - 5 %   Eosinophils Absolute 0.1  0.0 - 0.7 K/uL   Basophils Relative 0  0 - 1 %   Basophils Absolute 0.0  0.0 - 0.1 K/uL  APTT     Status: None   Collection Time    08/05/13  9:01 PM      Result Value Range   aPTT 33  24 - 37 seconds    PROTIME-INR     Status: None   Collection Time    08/05/13  9:01 PM      Result Value Range   Prothrombin Time 14.3  11.6 - 15.2 seconds   INR 1.13  0.00 - 1.49  TSH     Status: None   Collection Time    08/05/13  9:01 PM      Result Value Range   TSH 3.520  0.350 - 4.500 uIU/mL   Comment: Performed at [employer] [facility]  BASIC METABOLIC PANEL     Status: Abnormal   Collection Time    08/06/13  5:10 AM      Result Value Range   Sodium 133 (*) 135 - 145 mEq/L   Potassium 3.6  3.5 - 5.1 mEq/L   Chloride 99  96 - 112 mEq/L   CO2 23  19 - 32 mEq/L   Glucose, Bld 97  70 - 99 mg/dL   BUN 15  6 - 23 mg/dL   Creatinine, Ser 9.60  0.50 - 1.35 mg/dL   Calcium 8.3 (*) 8.4 - 10.5 mg/dL   GFR calc non Af Amer 74 (*) >90 mL/min   GFR calc Af Amer 86 (*) >90 mL/min   Comment: (NOTE)     The eGFR has been calculated using the CKD EPI equation.     This calculation has not been validated in all clinical situations.     eGFR's persistently <90 mL/min signify possible Chronic Kidney     Disease.  CBC     Status: Abnormal   Collection Time    08/06/13  5:10 AM      Result Value Range   WBC 6.6  4.0 - 10.5 K/uL   RBC 4.10 (*) 4.22 - 5.81 MIL/uL   Hemoglobin 13.3  13.0 - 17.0 g/dL   HCT 45.4 (*) 09.8 - 11.9 %   MCV 90.0  78.0 - 100.0 fL   MCH 32.4  26.0 - 34.0 pg   MCHC 36.0  30.0 - 36.0 g/dL   RDW 14.7  82.9 - 56.2 %   Platelets 229  150 - 400 K/uL    Imaging: Portable Chest 1 View  08/05/2013   CLINICAL DATA:  Left-sided infiltrate  EXAM: PORTABLE CHEST - 1 VIEW  COMPARISON:  07/21/2013  FINDINGS: Cardiac shadow is stable. The right lung remains clear. The left lung is well aerated. Some mild residual density is seen although overall significant improvement is noted.  IMPRESSION: Significant improvement in left infiltrate. Continued followup is recommended.   Electronically Signed   By: Alcide Clever M.D.   On: 08/05/2013 18:46    Assessment:  Active Problems:  Sick sinus  syndrome   Hypertensive crisis   Labile hypertension   Plan:  1. He has very labile hypertension, despite interventions and frequent short acting medications. I wonder if this is autonomically driven paroxysmal hypertension. He clearly has BP spikes in the morning after awakening. I think he would benefit from an alpha-blocker, however, a centrally acting alpha blocker may cause worsening bradycardia (HR already in mid-40's). He has a history of BPH, therefore will add a peripherally acting a-blocker for now.  Increase amlodipine to 5 mg BID. Suspect he can be discharged today. Would recommend outpatient work-up for labile hypertension (urine and plasma metanephrines, renal dopplers, PRA ratio).  Time Spent Directly with Patient:  15 minutes  Length of Stay:  LOS: 2 days   Chrystie Nose, MD, [facility] Attending Cardiologist [facility]  HILTY,Kenneth C 08/07/2013, 7:48 AM

## 2013-08-08 ENCOUNTER — Encounter (HOSPITAL_COMMUNITY): Payer: Self-pay | Admitting: Cardiology

## 2013-08-08 ENCOUNTER — Encounter: Payer: Self-pay | Admitting: Cardiovascular Disease

## 2013-08-08 DIAGNOSIS — R001 Bradycardia, unspecified: Secondary | ICD-10-CM

## 2013-08-08 DIAGNOSIS — I480 Paroxysmal atrial fibrillation: Secondary | ICD-10-CM | POA: Diagnosis not present

## 2013-08-08 DIAGNOSIS — Z7901 Long term (current) use of anticoagulants: Secondary | ICD-10-CM

## 2013-08-08 HISTORY — DX: Bradycardia, unspecified: R00.1

## 2013-08-08 MED ORDER — APIXABAN 2.5 MG PO TABS
2.5000 mg | ORAL_TABLET | Freq: Two times a day (BID) | ORAL | Status: DC
  Administered 2013-08-08 – 2013-08-09 (×3): 2.5 mg via ORAL
  Filled 2013-08-08 (×4): qty 1

## 2013-08-08 NOTE — Progress Notes (Signed)
Spoke with Dr Herbie Baltimore about bp reading low after 10 o'clock meds. BP 80/40's in the afternoon. New orders given and implemented.  Amritpal Shropshire, Charlaine Dalton RN

## 2013-08-08 NOTE — Care Management Note (Signed)
    Page 1 of 1   08/08/2013     12:36:05 PM   CARE MANAGEMENT NOTE 08/08/2013  Patient:  Dylan York, Dylan York   Account Number:  [id number]  Date Initiated:  08/08/2013  Documentation initiated by:  Junius Creamer  Subjective/Objective Assessment:   adm w at fib, htn crisis     Action/Plan:   lives w wife, pcp dr Herminio Commons vyas   Anticipated DC Date:     Anticipated DC Plan:        DC Planning Services  CM consult      Choice offered to / List presented to:             Status of service:   Medicare Important Message given?   (If response is "NO", the following Medicare IM given date fields will be blank) Date Medicare IM given:   Date Additional Medicare IM given:    Discharge Disposition:    Per UR Regulation:  Reviewed for med. necessity/level of care/duration of stay  If discussed at Long Length of Stay Meetings, dates discussed:    Comments:

## 2013-08-08 NOTE — Progress Notes (Addendum)
Subjective: No complaints  Objective: Vital signs in last 24 hours: Temp:  [97.7 F (36.5 C)-98.8 F (37.1 C)] 97.8 F (36.6 C) (10/06 0835) Pulse Rate:  [49-66] 66 (10/06 0835) Resp:  [18] 18 (10/06 0400) BP: (83-152)/(36-64) 128/60 mmHg (10/06 0800) SpO2:  [91 %-100 %] 92 % (10/06 0835) Weight change:  Last BM Date: 08/06/13 Intake/Output from previous day: +280 10/05 0701 - 10/06 0700 In: 580 [P.O.:580] Out: 300 [Urine:300] Intake/Output this shift:    PE: General:Pleasant affect, NAD Skin:Warm and dry, brisk capillary refill HEENT:normocephalic, sclera clear, mucus membranes moist Neck:supple, no JVD  Heart:S1S2 RRR with soft systolic murmur, gallup, rub or click Lungs:clear without rales, rhonchi, or wheezes ZOX:WRUE, non tender, + BS, do not palpate liver spleen or masses Ext:no lower ext edema, 2+ pedal pulses, 2+ radial pulses Neuro:alert and oriented, MAE, follows commands, + facial symmetry   Lab Results:  Recent Labs  08/05/13 2101 08/06/13 0510  WBC 7.3 6.6  HGB 13.1 13.3  HCT 37.3* 36.9*  PLT 231 229   BMET  Recent Labs  08/05/13 2101 08/06/13 0510  NA 135 133*  K 3.2* 3.6  CL 99 99  CO2 22 23  GLUCOSE 132* 97  BUN 16 15  CREATININE 1.04 0.96  CALCIUM 8.4 8.3*   No results found for this basename: TROPONINI, CK, MB,  in the last 72 hours  Lab Results  Component Value Date   CHOL 162 04/06/2013   HDL 53 04/06/2013   LDLCALC 92 04/06/2013   TRIG 84 04/06/2013   CHOLHDL 3.1 04/06/2013   No results found for this basename: HGBA1C     Lab Results  Component Value Date   TSH 3.520 08/05/2013    Hepatic Function Panel  Recent Labs  08/05/13 2101  PROT 6.0  ALBUMIN 2.9*  AST 28  ALT 33  ALKPHOS 74  BILITOT 1.0    Studies/Results: No results found.  Medications: I have reviewed the patient's current medications. Scheduled Meds: . amiodarone  200 mg Oral Daily  . amLODipine  5 mg Oral BID  . aspirin EC  81 mg Oral  Daily  . benazepril  40 mg Oral Daily  . docusate sodium  100 mg Oral BID  . doxazosin  4 mg Oral Daily  . finasteride  5 mg Oral Daily  . hydrALAZINE  25 mg Oral Q8H  . hydrochlorothiazide  12.5 mg Oral Daily  . latanoprost  1 drop Both Eyes Daily  . multivitamin with minerals  1 tablet Oral q morning - 10a  . pantoprazole  40 mg Oral Daily  . sodium chloride  3 mL Intravenous Q12H  . white petrolatum   Topical Daily   Continuous Infusions: . sodium chloride Stopped (08/06/13 0945)   PRN Meds:.acetaminophen, acetaminophen, alum & mag hydroxide-simeth, hydrALAZINE, morphine injection, ondansetron (ZOFRAN) IV, ondansetron, zolpidem  Assessment/Plan: Principal Problem:   Labile hypertension Active Problems:   Sick sinus syndrome   Hypertensive crisis   PAF (paroxysmal atrial fibrillation)   Sinus bradycardia, with HR in the 40s BB stopped.  PLAN: BP improved on Norvasc, Lotensin, cardura- new, Hydralazine 25 mg TID- new.  HCTZ 12.5 .  Will need renal dopplers.  HR  Continues in the 50s S brady.  BB has been stopped and Amiodarone has been decreased to 200 mg daily secondary to HR.  See CT chest results.  Resume eliquis now BP stable for PAF now maintaining SB.  Stop ASA. See Dr. Elissa Hefty note.   LOS: 3 days   Time spent with pt. :15 minutes. [facility]  Nurse Practitioner Certified Pager [phone] 08/08/2013, 8:40 AM  I have seen and evaluated the patient this AM along with Nada Boozer, NP.  I agree with her findings, examination as well as impression recommendations.  BP much more stable o/n.  Rate also improved.   I do agree with lower dose medications spaced more interspersed throughout the day to avoid Peaks & troughs of BP.    Off BB - with Hydralazine 25 mg tid (I think TID is reasonable). Amlodipine @ 5 mg bid. HCTZ (chemistry stable - but need to monitor K+ intermittently)  Given his age & labile BPs - I would like to see an entire day of stable BP/HR on this  current regimen prior to d/c.  Transfer to Tele & ambulate. Anticipate d/c in AM if he remains stable. Restart Eliquis & d.c ASA  MD Time with pt: 15 min  Luan Maberry W, M.D., M.S. [facility] [address] [place], [place]  [address]  [phone] Pager # [phone] 08/08/2013 9:19 AM

## 2013-08-09 ENCOUNTER — Telehealth: Payer: Self-pay | Admitting: Cardiovascular Disease

## 2013-08-09 DIAGNOSIS — I119 Hypertensive heart disease without heart failure: Secondary | ICD-10-CM

## 2013-08-09 MED ORDER — DOXAZOSIN MESYLATE 4 MG PO TABS: 4.0000 mg | ORAL_TABLET | Freq: Every day | ORAL | Status: DC

## 2013-08-09 MED ORDER — AMLODIPINE BESYLATE 5 MG PO TABS: 5.0000 mg | ORAL_TABLET | Freq: Two times a day (BID) | ORAL | Status: DC

## 2013-08-09 MED ORDER — HYDRALAZINE HCL 25 MG PO TABS: 25.0000 mg | ORAL_TABLET | Freq: Three times a day (TID) | ORAL | Status: DC

## 2013-08-09 NOTE — Discharge Summary (Signed)
Physician Discharge Summary  Patient ID: Dylan York MRN: [id number] DOB/AGE: 1925-11-30 79 y.o.  Admit date: 08/05/2013 Discharge date: 08/09/2013  Admission Diagnoses:  Hypertensive Emergency.  Discharge Diagnoses:  Principal Problem:   Labile hypertension Active Problems:   Sick sinus syndrome   Hypertensive crisis   PAF (paroxysmal atrial fibrillation)   Sinus bradycardia, with HR in the 40s BB stopped.   Discharged Condition: stable  Hospital Course:  Dylan York is a 79 y.o. male who presents to the office in followup of his recent development of atrial fibrillation. Dylan York has a history of sick sinus syndrome in the past has felt episodes of atrial tachycardia as well as sinus bradycardia, ventricular bigeminy, and frequent PVCs he had ultimately stabilized with Bystolic when he was seen on 03/08/2013 atrial fibrillation of questionable duration. At that time I increased his Bystolic and his started anticoagulation with Eliquis 2.5 mg twice a day. An echo Doppler study was done on 03/30/2013 which revealed normal systolic function with an ejection fraction of 60-65%, aortic valve sclerosis, biatrial enlargement, as well as mild pulmonary hypertension with a PA pressure of 41 mm. He was seen in followup on 04/04/2013 and was in AF 77 beats per minute. After much discussion with the restoration of sinus rhythm and antiarrhythmic therapy alternatives, Dr. Tresa Endo initially started him on Multaq 400 milligrams twice a day. He did not tolerate this and ultimately discontinue its use. When he saw him on 04/26/2013 after much discussion, he started him on amiodarone 200 mg daily. He has tolerated this well. 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 When I last saw on 05/27/2013 I. further titrated his amiodarone to 200 mg twice a day. Dr. Tresa Endo also recommended that he reduce his  Bystolic from 10-5 mg. He presents now for followup evaluation.   Patient has a presented with uncontrolled blood pressure. He also reported some double vision which was intermittent. In the office his pressure is 220/100. He denied nausea, vomiting, fever, chest pain, shortness of breath, orthopnea, dizziness, PND, cough, congestion, abdominal pain, hematochezia, melena, lower extremity edema, claudication.  He was admitted to [facility] for BP control.  He was started on IV Cardene.  Bystolic was held and amiodarone was decreased.  He continued to have problems with hyper and hypotension along with some nausea on Sunday.  Nicardipine was DCd and amlodipine started and then dose BID.  Hydralazind TID was also added.  The pt ultimately had improvement in BP.  Out patient renal dopplers.  Also consider outpatient metanephrines.  The patient was seen by Dr. Tresa Endo who felt she was stable for DC home.      Consults: None  Significant Diagnostic Studies:  EXAM: PORTABLE CHEST - 1 VIEW  COMPARISON: 07/21/2013  FINDINGS: Cardiac shadow is stable. The right lung remains clear. The left lung is well aerated. Some mild residual density is seen although overall significant improvement is noted.  IMPRESSION: Significant improvement in left infiltrate. Continued followup is recommended.   Treatments:See above  Discharge Exam: Blood pressure 135/64, pulse 57, temperature 98.6 F (37 C), temperature source Oral, resp. rate 16, height 5\' 8"  (1.727 m), weight 158 lb 1.1 oz (71.7 kg), SpO2 94.00%.   Disposition: 01-Home or Self Care  Discharge Orders   Future Orders Complete By Expires   Diet - low sodium heart healthy  As directed  Increase activity slowly  As directed        Medication List    STOP taking these medications       cefUROXime 500 MG tablet  Commonly known as:  CEFTIN     hydrochlorothiazide 12.5 MG tablet  Commonly known as:  HYDRODIURIL     nebivolol 5 MG tablet  Commonly  known as:  BYSTOLIC      TAKE these medications       amiodarone 200 MG tablet  Commonly known as:  PACERONE  Take 1 tablet (200 mg total) by mouth 2 (two) times daily.     amLODipine 5 MG tablet  Commonly known as:  NORVASC  Take 1 tablet (5 mg total) by mouth 2 (two) times daily.     benazepril 40 MG tablet  Commonly known as:  LOTENSIN  Take 40 mg by mouth daily.     doxazosin 4 MG tablet  Commonly known as:  CARDURA  Take 1 tablet (4 mg total) by mouth daily.     ELIQUIS 2.5 MG Tabs tablet  Generic drug:  apixaban  Take 2.5 mg by mouth 2 (two) times daily.     finasteride 5 MG tablet  Commonly known as:  PROSCAR  Take 5 mg by mouth daily.     hydrALAZINE 25 MG tablet  Commonly known as:  APRESOLINE  Take 1 tablet (25 mg total) by mouth every 8 (eight) hours.     MULTIVITAL PO  Take 1 tablet by mouth daily.     omeprazole 20 MG capsule  Commonly known as:  PRILOSEC  Take 20 mg by mouth daily before breakfast.     TRAVATAN Z 0.004 % Soln ophthalmic solution  Generic drug:  Travoprost (BAK Free)  Place 1 drop into both eyes daily.     VITAMIN E PO  Take 1 tablet by mouth daily.           Follow-up Information   Follow up with Lennette Bihari, MD Today. (Our office scheduler will call you with the follow-up appt date and time.)    Specialty:  Cardiology   Contact information:   [address] [place] [address] [phone]       Signed: Wilburt Finlay 08/09/2013, 11:41 AM

## 2013-08-09 NOTE — Telephone Encounter (Signed)
Would like to know if its ok for him to drive if he wants to.. Please call  thanks

## 2013-08-09 NOTE — Progress Notes (Signed)
Subjective: Feeling better.  Objective: Vital signs in last 24 hours: Temp:  [98 F (36.7 C)-99 F (37.2 C)] 98.6 F (37 C) (10/07 0516) Pulse Rate:  [51-62] 57 (10/07 0516) Resp:  [16-18] 16 (10/07 0516) BP: (91-158)/(42-82) 135/64 mmHg (10/07 0516) SpO2:  [92 %-98 %] 94 % (10/07 0516) Last BM Date: 08/08/13  Intake/Output from previous day: 10/06 0701 - 10/07 0700 In: 600 [P.O.:600] Out: -  Intake/Output this shift:      Medications Current Facility-Administered Medications  Medication Dose Route Frequency Provider Last Rate Last Dose  . 0.9 %  sodium chloride infusion   Intravenous Continuous Nada Boozer, NP      . acetaminophen (TYLENOL) tablet 650 mg  650 mg Oral Q6H PRN Nada Boozer, NP   650 mg at 08/07/13 1610   Or  . acetaminophen (TYLENOL) suppository 650 mg  650 mg Rectal Q6H PRN Nada Boozer, NP      . alum & mag hydroxide-simeth (MAALOX/MYLANTA) 200-200-20 MG/5ML suspension 30 mL  30 mL Oral Q6H PRN Nada Boozer, NP      . amiodarone (PACERONE) tablet 200 mg  200 mg Oral Daily Nada Boozer, NP   200 mg at 08/08/13 0958  . amLODipine (NORVASC) tablet 5 mg  5 mg Oral BID Chrystie Nose, MD   5 mg at 08/08/13 2219  . apixaban (ELIQUIS) tablet 2.5 mg  2.5 mg Oral BID Nada Boozer, NP   2.5 mg at 08/08/13 2115  . benazepril (LOTENSIN) tablet 40 mg  40 mg Oral Daily Nada Boozer, NP   40 mg at 08/08/13 0958  . docusate sodium (COLACE) capsule 100 mg  100 mg Oral BID Nada Boozer, NP   100 mg at 08/08/13 2116  . doxazosin (CARDURA) tablet 4 mg  4 mg Oral Daily Chrystie Nose, MD   4 mg at 08/08/13 0959  . finasteride (PROSCAR) tablet 5 mg  5 mg Oral Daily Nada Boozer, NP   5 mg at 08/08/13 0958  . hydrALAZINE (APRESOLINE) injection 10 mg  10 mg Intravenous Q6H PRN Nada Boozer, NP   10 mg at 08/07/13 9604  . hydrALAZINE (APRESOLINE) tablet 25 mg  25 mg Oral Q8H Bryan Hager, PA-C   25 mg at 08/09/13 0520  . latanoprost (XALATAN) 0.005 % ophthalmic solution 1  drop  1 drop Both Eyes Daily Lennette Bihari, MD   1 drop at 08/08/13 2116  . morphine 2 MG/ML injection 2 mg  2 mg Intravenous Q2H PRN Nada Boozer, NP      . multivitamin with minerals tablet 1 tablet  1 tablet Oral q morning - 10a Nada Boozer, NP   1 tablet at 08/08/13 0959  . ondansetron (ZOFRAN) tablet 4 mg  4 mg Oral Q6H PRN Nada Boozer, NP       Or  . ondansetron [facility]) injection 4 mg  4 mg Intravenous Q6H PRN Nada Boozer, NP   4 mg at 08/07/13 1620  . pantoprazole (PROTONIX) EC tablet 40 mg  40 mg Oral Daily Nada Boozer, NP   40 mg at 08/08/13 0959  . sodium chloride 0.9 % injection 3 mL  3 mL Intravenous Q12H Nada Boozer, NP   3 mL at 08/08/13 2121  . white petrolatum (VASELINE) gel   Topical Daily Lennette Bihari, MD      . zolpidem [facility]) tablet 5 mg  5 mg Oral QHS PRN Nada Boozer, NP        PE:  General appearance: alert, cooperative and no distress Lungs: clear to auscultation bilaterally Heart: regular rate and rhythm, S1, S2 normal, no murmur, click, rub or gallop Extremities: No LEE Pulses: 2+ and symmetric Neurologic: Grossly normal    Assessment/Plan    Principal Problem:   Labile hypertension Active Problems:   Sick sinus syndrome   Hypertensive crisis   PAF (paroxysmal atrial fibrillation)   Sinus bradycardia, with HR in the 40s BB stopped.  Plan:  BP appears more stable. DC home on current therapy.  Ambulate prior to DC.     LOS: 4 days    HAGER, BRYAN 08/09/2013 9:18 AM   Patient seen and examined. Agree with assessment and plan. Pt is maintaining sinus rhythm. He is s/p a recent pneumonia.  BP improved. Eliquis resumed at previous low dose 2.5 bid. For dc today, and f/u ov with me.   Lennette Bihari, MD, [facility] 08/09/2013 10:19 AM

## 2013-08-23 ENCOUNTER — Encounter: Payer: Self-pay | Admitting: Cardiovascular Disease

## 2013-08-23 ENCOUNTER — Ambulatory Visit (INDEPENDENT_AMBULATORY_CARE_PROVIDER_SITE_OTHER): Payer: Medicare Other | Admitting: Cardiovascular Disease

## 2013-08-23 VITALS — BP 138/72 | HR 61 | Ht 68.0 in | Wt 162.5 lb

## 2013-08-23 DIAGNOSIS — Z7901 Long term (current) use of anticoagulants: Secondary | ICD-10-CM

## 2013-08-23 DIAGNOSIS — R600 Localized edema: Secondary | ICD-10-CM | POA: Insufficient documentation

## 2013-08-23 DIAGNOSIS — I4891 Unspecified atrial fibrillation: Secondary | ICD-10-CM

## 2013-08-23 DIAGNOSIS — I10 Essential (primary) hypertension: Secondary | ICD-10-CM

## 2013-08-23 DIAGNOSIS — I495 Sick sinus syndrome: Secondary | ICD-10-CM

## 2013-08-23 DIAGNOSIS — I480 Paroxysmal atrial fibrillation: Secondary | ICD-10-CM

## 2013-08-23 DIAGNOSIS — R609 Edema, unspecified: Secondary | ICD-10-CM

## 2013-08-23 DIAGNOSIS — R0989 Other specified symptoms and signs involving the circulatory and respiratory systems: Secondary | ICD-10-CM

## 2013-08-23 NOTE — Patient Instructions (Addendum)
Your physician has recommended you make the following change in your medication: decrease the amiodarone to 300mg  daily ( 1& 1/2 tablet daily). Decrease the amlodipine down to 7.5 mg daily. ( 1& 1/2 tablet daily.)   Your physician recommends that you schedule a follow-up appointment in: 2 MONTHS.

## 2013-08-23 NOTE — Progress Notes (Signed)
Patient ID: [name], male   DOB: 16-Apr-1926, 79 y.o.   MRN: [id number]      HPI: Dylan York, is a 79 y.o. male who presents to the office in followup of his recent hospitalization when he was admitted with accelerated hypertension.    Dylan York has a history of sick sinus syndrome in the past has felt episodes of atrial tachycardia as well as sinus bradycardia, ventricular bigeminy, and frequent PVCs.  He had  stabilized with Bystolic but when he was seen on 03/08/2013 atrial fibrillation of questionable duration. At that time I increased his Bystolic and his started anticoagulation with Eliquis 2.5 mg twice a day. An echo Doppler study on 03/30/2013 revealed normal systolic function with an ejection fraction of 60-65%, aortic valve sclerosis, biatrial enlargement, as well as mild pulmonary hypertension with a PA pressure of 41 mm. On 04/04/2013 AF 77 rate was beats per minute. He was initially started  on Multaq 400 milligrams twice a day. He did not tolerate this and ultimately discontinue its use.  When  l saw him on 04/26/2013 after much discussion I started him on amiodarone 200 mg daily. He has tolerated this well. 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  On 05/27/2013 I. further titrated his amiodarone to 200 mg twice a day. I also recommended that he reduce his Bystolic from 10-5 mg.  with a plan to do a cardioversion but as part of his preoperative assessment he was found to have pneumonia. Consequently his pneumonia was treated with antibiotic therapy by Dylan York.  08/05/2013 he was seen in the office as an add-on at which time he had significant accelerated hypertension with a blood pressure of 220/100. He was seen by Dylan York and admitted Dylan York. He initially was treated with IV Cardene of note, he converted to sinus rhythm. Ultimately nicardipine was  discontinued and amlodipine was started as well as hydralazine. He was discharged home on 08/09/2013. Since going home, he has taken his blood pressures religiously. These have been fairly well controlled. His heart rhythm has been fairly stable. He has noticed some mild ankle swelling right greater than left. Apparently he did see Dylan York and lower extremityDoppler studies were negative for DVT. He presents now for followup carotic evaluation   Allergies  Allergen Reactions  . Multaq [Dronedarone] Shortness Of Breath    Current Outpatient Prescriptions  Medication Sig Dispense Refill  . amiodarone (PACERONE) 200 MG tablet Take 1 tablet (200 mg total) by mouth 2 (two) times daily.  60 tablet  6  . amLODipine (NORVASC) 5 MG tablet Take 1 tablet (5 mg total) by mouth 2 (two) times daily.  60 tablet  5  . apixaban (ELIQUIS) 2.5 MG TABS tablet Take 2.5 mg by mouth 2 (two) times daily.       . benazepril (LOTENSIN) 40 MG tablet Take 40 mg by mouth daily.      Marland Kitchen doxazosin (CARDURA) 4 MG tablet Take 1 tablet (4 mg total) by mouth daily.  30 tablet  5  . finasteride (PROSCAR) 5 MG tablet Take 5 mg by mouth daily.      . furosemide (LASIX) 20 MG tablet Take 1 tablet by mouth daily.      . hydrALAZINE (APRESOLINE) 25 MG tablet Take 1 tablet (25 mg total) by mouth every 8 (eight) hours.  90  tablet  5  . LORazepam (ATIVAN) 1 MG tablet       . Multiple Vitamins-Minerals (MULTIVITAL PO) Take 1 tablet by mouth daily.       Marland Kitchen omeprazole (PRILOSEC) 20 MG capsule Take 20 mg by mouth daily before breakfast.      . potassium chloride (K-DUR,KLOR-CON) 10 MEQ tablet Take 1 tablet by mouth daily.      . TRAVATAN Z 0.004 % SOLN ophthalmic solution Place 1 drop into both eyes daily.      Marland Kitchen VITAMIN E PO Take 1 tablet by mouth daily.        No current facility-administered medications for this visit.    Socially he is married and has 3 children he quit tobacco in 1978. 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 He tries to play golf once a week.  ROS is negative for fevers, chills or night sweats. He denies rash. There is no wheezing. He denies recent tachycardia palpitations. There is no presyncope. He denies recent reflux he denies any heart failure. He denies syncope. He denies bleeding. He denies abdominal pain. Denies paresthesias.   Other system review is negative.  PE BP 138/72  Pulse 61  Ht 5\' 8"  (1.727 m)  Wt 162 lb 8 oz (73.71 kg)  BMI 24.71 kg/m2  Repeat blood pressure 124/76 General: Alert, oriented, no distress.  Skin: normal turgor, no rashes HEENT: Normocephalic, atraumatic. Pupils round and reactive; sclera anicteric;no lid lag.  Nose without nasal septal hypertrophy Mouth/Parynx benign; Mallinpatti scale 3 Neck: No JVD, no carotid briuts Lungs: clear to ausculatation and percussion; no wheezing or rales Heart:  RRR with a ventricular rate approximately  60  no s3. 1/6 sem Abdomen: soft, nontender; no hepatosplenomehaly, BS+; abdominal aorta nontender and not dilated by palpation. Pulses 2+ Extremities: 1+ right ankle edema;no clubbing cyanosis, Homan's sign negative  Neurologic: grossly nonfocal  ECG: Normal sinus rhythm at 61 beats per minute. Non-specific intra-ventricular conduction delay. Nonspecific ST-T changes. QTc interval 446 ms  LABS:  BMET    Component Value Date/Time   NA 133* 08/06/2013 0510   K 3.6 08/06/2013 0510   CL 99 08/06/2013 0510   CO2 23 08/06/2013 0510   GLUCOSE 97 08/06/2013 0510   BUN 15 08/06/2013 0510   CREATININE 0.96 08/06/2013 0510   CREATININE 1.16 07/21/2013 1126   CALCIUM 8.3* 08/06/2013 0510   GFRNONAA 74* 08/06/2013 0510   GFRAA 86* 08/06/2013 0510     Hepatic Function Panel     Component Value Date/Time   PROT 6.0 08/05/2013 2101     CBC    Component Value Date/Time   WBC 6.6 08/06/2013 0510   RBC 4.10* 08/06/2013 0510   HGB 13.3 08/06/2013 0510   HCT 36.9* 08/06/2013 0510   PLT 229 08/06/2013 0510   MCV 90.0 08/06/2013  0510   MCH 32.4 08/06/2013 0510   MCHC 36.0 08/06/2013 0510   RDW 12.9 08/06/2013 0510   LYMPHSABS 1.3 08/05/2013 2101   MONOABS 0.5 08/05/2013 2101   EOSABS 0.1 08/05/2013 2101   BASOSABS 0.0 08/05/2013 2101     BNP No results found for this basename: probnp    Lipid Panel     Component Value Date/Time   CHOL 162 04/06/2013 0850   TRIG 84 04/06/2013 0850   HDL 53 04/06/2013 0850   CHOLHDL 3.1 04/06/2013 0850   VLDL 17 04/06/2013 0850   LDLCALC 92 04/06/2013 0850     RADIOLOGY: No results  found.    ASSESSMENT AND PLAN: Mr. Costlow is maintaining normal sinus rhythm and continues to be on amiodarone at 200 mg twice a day. He no longer is taking the systolic. In the past, he did have evidence for sinus node dysfunction with bradycardia as well as ventricular ectopy. I suspect his peripheral edema is contributed by his amlodipine. His blood pressure today is stable at 124/76 retaken by me. I recommend he decrease his Norvasc from 10 mg to 7.5 mg. I'm also further reducing his amiodarone from 400 mg daily to 300 mg daily. I will see him in the office in 2 months for followup evaluation further recommendations will be made at that time.    Lennette Bihari, MD, [facility]  08/23/2013 3:05 PM

## 2013-09-02 ENCOUNTER — Telehealth: Payer: Self-pay | Admitting: Cardiovascular Disease

## 2013-09-02 NOTE — Telephone Encounter (Signed)
Has questions about his medications---he was discharged from the hospital on 08/09/13.  Thinks he may be having a reaction to one of the medications.

## 2013-09-02 NOTE — Telephone Encounter (Signed)
Returned patient's call - patient has concerns over medications. C/o RLE edema, toes up to knee - went to PCP who prescribed lasix 20mg  QD and potassium - reports has been on this medication for about 15 days and has not noticed a difference in swelling. Reports had had a doppler of his leg done at [facility] and that the test was negative for DVT and the technician told patient there was a baker's cyst behind R knee.. Patient stated PCP did not inform him of this, and we have no record of this doppler study.   Patient also stated that he has felt dizzy and faint, mostly in AM, with some associated nausea - was wondering if could be Eliquis that was causing these symptoms (has been on this medication since Sept).   Patient provided RN with BP recordings from 10-22 to 10/31: AM average of 126/62 noon average of 110/54 Evening average of 136/65  Patient reports some spikes in BP on 10/29 (160s-170s systolic) but had lots of family over at house at that time/stress.  RN informed patient that given his hypertension in past, this drastic decrease in BP could be causing some lightheadedness.   RN informed patient that Dr. Tresa Endo would be notified of concerns (RLE edema and dizziness/faintness - r/t medications?) and that he would be contacted as soon recommendation are available from Dr. Tresa Endo. Informed patient to monitor symptoms and he would likely hear something early next week.   Patient agreed with plan & verbalized understanding.

## 2013-09-06 NOTE — Telephone Encounter (Signed)
Have not heard from anyone.  Pt stated Dylan York was supposed to call him yesterday  Wants Dylan York to call him

## 2013-09-07 NOTE — Telephone Encounter (Signed)
Spoke with patient - discussed that dizziness/faintness could likely be due to taking doxazosin in am.  Postural hypotension can be a problem with this med.  Per pt it was stopped awhile back, but restarted after his BP increased.  I asked patient to switch this med to evenings as of tomorrow to see if the morning faintness could be related to that.  As for the swelling in his leg, pt states he was told there was a Bakers cyst in R leg, also has been started on lasix 20 plus potassium, but has noticed no change in the leg.  His home BP readings have all (pt takes three times per day) been averaging <130/70.  I suggested he cut amlodipine to 5mg  once daily as 10mg  will sometimes cause swelling for patients.  Because both of these meds affect BP, I have asked that he continue to check home BP and to call me on Monday and let me know how things are looking.  Pt voiced understanding.

## 2013-09-07 NOTE — Telephone Encounter (Signed)
forwarded to [facility] per her request.

## 2013-09-08 ENCOUNTER — Other Ambulatory Visit: Payer: Self-pay

## 2013-09-08 MED ORDER — BENAZEPRIL HCL 40 MG PO TABS: 40.0000 mg | ORAL_TABLET | Freq: Every day | ORAL | Status: DC

## 2013-09-08 NOTE — Telephone Encounter (Signed)
Rx was sent to pharmacy electronically. 

## 2013-09-11 NOTE — Telephone Encounter (Signed)
agree

## 2013-09-19 ENCOUNTER — Telehealth: Payer: Self-pay | Admitting: Pharmacist Clinician (PhC)/ Clinical Pharmacy Specialist

## 2013-09-19 NOTE — Telephone Encounter (Signed)
Called pt back,  He states that he held his amlodipine x 7 days and the swelling has decreased, but still present.  Restarted at 2.5mg  with no change, will continue with that dose.  Also still having some dizziness/weakness mid-day/early afternoon, despite moving doxazosin to evenings.  He takes his BP tid - getting 125/60 (average) in am and hs, but drops 10-15 points systolic during mid-day.   Currently taking amlodipine, amiodarone,  hydralazine, apixiban and finasteride in am, hydralazine at mid-day, and apixiban, benazepril, hydralazine, amidoarone (1/2 tab) and doxazosin at hs.    I asked pt to stop the mid-day hydralazine for now, leaving all other meds alone.  He is to check back with me in another 10 days to see if the changes help.

## 2013-09-19 NOTE — Telephone Encounter (Signed)
Pt LM on VM asking me to call back regarding previous changes to BP meds.

## 2013-09-21 ENCOUNTER — Telehealth: Payer: Self-pay | Admitting: Pharmacist Clinician (PhC)/ Clinical Pharmacy Specialist

## 2013-09-21 MED ORDER — HYDRALAZINE HCL 25 MG PO TABS: 25.0000 mg | ORAL_TABLET | Freq: Two times a day (BID) | ORAL | Status: DC

## 2013-09-21 NOTE — Telephone Encounter (Signed)
Patient states that he was to call you today about his blood pressure readings and to talk about his medications.

## 2013-09-21 NOTE — Telephone Encounter (Signed)
Pt calling in BP results, still checking tid.  Average readings 130/64 with range of 99-153 systolic.  Pt states feeling better, no more dizziness or unsteadiness.  Even managed to work in yard yesterday without problem.  I advised that he continue with current regimen until appointment with Dr. Tresa Endo at the end of December.

## 2013-10-31 ENCOUNTER — Ambulatory Visit (INDEPENDENT_AMBULATORY_CARE_PROVIDER_SITE_OTHER): Payer: Medicare Other | Admitting: Cardiovascular Disease

## 2013-10-31 ENCOUNTER — Encounter: Payer: Self-pay | Admitting: Cardiovascular Disease

## 2013-10-31 ENCOUNTER — Telehealth: Payer: Self-pay | Admitting: *Deleted

## 2013-10-31 VITALS — BP 132/74 | HR 61 | Ht 68.0 in | Wt 166.6 lb

## 2013-10-31 DIAGNOSIS — R0989 Other specified symptoms and signs involving the circulatory and respiratory systems: Secondary | ICD-10-CM

## 2013-10-31 DIAGNOSIS — I10 Essential (primary) hypertension: Secondary | ICD-10-CM

## 2013-10-31 DIAGNOSIS — R600 Localized edema: Secondary | ICD-10-CM

## 2013-10-31 DIAGNOSIS — Z7901 Long term (current) use of anticoagulants: Secondary | ICD-10-CM

## 2013-10-31 DIAGNOSIS — R5381 Other malaise: Secondary | ICD-10-CM

## 2013-10-31 DIAGNOSIS — I4891 Unspecified atrial fibrillation: Secondary | ICD-10-CM

## 2013-10-31 DIAGNOSIS — Z79899 Other long term (current) drug therapy: Secondary | ICD-10-CM

## 2013-10-31 DIAGNOSIS — I480 Paroxysmal atrial fibrillation: Secondary | ICD-10-CM

## 2013-10-31 DIAGNOSIS — I495 Sick sinus syndrome: Secondary | ICD-10-CM

## 2013-10-31 DIAGNOSIS — R609 Edema, unspecified: Secondary | ICD-10-CM

## 2013-10-31 NOTE — Telephone Encounter (Signed)
Patient here for a visit today and Amlodipine was discontinued.

## 2013-10-31 NOTE — Patient Instructions (Signed)
Stop Amlodipine.  Take Furosemide every day for swelling.  Wear compression stocking during waking hours.  Your physician recommends that you return for lab work in: 6 weeks  Before your appointment with Dr. Tresa Endo.  Your physician recommends that you schedule a follow-up appointment in: 6 weeks

## 2013-10-31 NOTE — Progress Notes (Signed)
Patient ID: Dylan York, male   DOB: 03/09/1926, 78 y.o.   MRN: [id number]       HPI: Dylan York, is a 79 y.o. male who presents to the office for followup carotid evaluation and further evaluation of his previous accelerated hypertension and atrial fibrillation/sick sinus syndrome.  Mr. Clardy has a history of sick sinus syndrome in the past has felt episodes of atrial tachycardia as well as sinus bradycardia, ventricular bigeminy, and frequent PVCs.  He had  stabilized with Bystolic but when he was seen on 03/08/2013 he was in atrial fibrillation of questionable duration. At that time I increased his Bystolic and his started anticoagulation with Eliquis 2.5 mg twice a day. An echo Doppler study on 03/30/2013 revealed normal systolic function with an ejection fraction of 60-65%, aortic valve sclerosis, biatrial enlargement, as well as mild pulmonary hypertension with a PA pressure of 41 mm. On 04/04/2013 AF 77 rate was beats per minute. He was initially started  on Multaq 400 milligrams twice a day. He did not tolerate this and ultimately discontinue its use.  When  l saw him on 04/26/2013 after much discussion I started him on amiodarone 200 mg daily. He has tolerated this well. 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  On 05/27/2013 I. further titrated his amiodarone to 200 mg twice a day. I also recommended that he reduce his Bystolic from 10 to 5 mg with a plan to do a cardioversion but as part of his preoperative assessment he was found to have pneumonia. Consequently his pneumonia was treated with antibiotic therapy by Dr. Sherril Croon in [place]. On 08/05/2013 he was seen in the office as an add-on at which time he had significant accelerated hypertension with a blood pressure of 220/100. He was seen by Huey Bienenstock and admitted [place]. He initially was treated with IV Cardene of note, he  converted to sinus rhythm. Ultimately nicardipine was discontinued and amlodipine was started as well as hydralazine. He was discharged home on 08/09/2013. Since going home, he has taken his blood pressures religiously. These have been fairly well controlled. His heart rhythm has been fairly stable. He has noticed some mild ankle swelling right greater than left. Apparently he did see Dr. Sherril Croon and lower extremityDoppler studies were negative for DVT. When I last saw him on 08/23/2013 102 stitches amlodipine from 10-7.5 mg and reduce his amiodarone from 400 mg to 300 mg. Subsequently, he now is on a further reduce dose of amlodipine at just 2.5 mg daily. He does note continued increasing lower extremity edema, right leg greater than left. He has not been taking his furosemide  20 mg regularly. 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 He denies episodes of chest pressure. He has been avoiding sodium intake. He presents for evaluation.  Allergies  Allergen Reactions  . Multaq [Dronedarone] Shortness Of Breath    Current Outpatient Prescriptions  Medication Sig Dispense Refill  . amiodarone (PACERONE) 200 MG tablet Take 200 mg in morning and 100 mg in evening      . amLODipine (NORVASC) 5 MG tablet Take 2.5 mg by mouth daily.      Marland Kitchen apixaban (ELIQUIS) 2.5 MG TABS tablet Take 2.5 mg by mouth 2 (two) times daily.       . benazepril (LOTENSIN) 40 MG  tablet Take 1 tablet (40 mg total) by mouth daily.  30 tablet  6  . doxazosin (CARDURA) 4 MG tablet Take 1 tablet (4 mg total) by mouth daily.  30 tablet  5  . finasteride (PROSCAR) 5 MG tablet Take 5 mg by mouth daily.      . furosemide (LASIX) 20 MG tablet Take 1 tablet by mouth daily.      . hydrALAZINE (APRESOLINE) 25 MG tablet Take 1 tablet (25 mg total) by mouth 2 (two) times daily.  60 tablet  5  . LORazepam (ATIVAN) 1 MG tablet Take 0.5 mg by mouth at bedtime.       . Multiple  Vitamins-Minerals (MULTIVITAL PO) Take 1 tablet by mouth daily.       Marland Kitchen omeprazole (PRILOSEC) 20 MG capsule Take 20 mg by mouth daily before breakfast.      . potassium chloride (K-DUR,KLOR-CON) 10 MEQ tablet Take 1 tablet by mouth daily.      . TRAVATAN Z 0.004 % SOLN ophthalmic solution Place 1 drop into both eyes daily.       No current facility-administered medications for this visit.    Socially he is married and has 3 children he quit tobacco in 1978. 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  ROS is negative for fevers, chills or night sweats. He denies rash. He has noticed some slight visual change. He does have a history of glaucoma. There is no change in hearing. He denies lymphadenopathy. He denies cough. There is no wheezing. He denies PND orthopnea He denies recent tachycardia palpitations. There is no presyncope. He denies recent reflux he denies any heart failure. He denies syncope. He denies chest pressure. He denies bleeding. He denies abdominal pain. There is no nausea vomiting or diarrhea. He denies change in bowel bladder habits. He does admit to significant swelling particularly in his right lower extremity and mildly in the left lower extremity. He denies arthritic symptoms.  Denies paresthesias.  He denies difficulty with sleep. Other comprehensive 14 point system review is negative.  PE BP 132/74  Pulse 61  Ht 5\' 8"  (1.727 m)  Wt 166 lb 9.6 oz (75.569 kg)  BMI 25.34 kg/m2  Repeat blood pressure 124/76 General: Alert, oriented, no distress.  Skin: normal turgor, no rashes HEENT: Normocephalic, atraumatic. Pupils round and reactive; sclera anicteric;no lid lag.  Nose without nasal septal hypertrophy Mouth/Parynx benign; Mallinpatti scale 3 Neck: No JVD, no carotid briuts Chest wall: No tenderness to palpate Lungs: clear to ausculatation and percussion; no wheezing or rales Heart:  RRR with a ventricular rate approximately   60  no s3. 1/6 sem Abdomen: soft, nontender; no hepatosplenomehaly, BS+; abdominal aorta nontender and not dilated by palpation. Back: No CVA tender Pulses 2+ Extremities: 1-2+ right ankle 2 pretibial edema and 1+ on the left ankle edema;no clubbing cyanosis, Homan's sign negative  Neurologic: grossly nonfocal; cranial nerves grossly normal Psychologic: Normal affect and mood  ECG: Normal sinus rhythm at 61 beats per minute. Isolated  PVC.  Non-specific intra-ventricular conduction delay. Nonspecific ST-T changes. QTc interval 461 ms.  LABS:  BMET    Component Value Date/Time   NA 133* 08/06/2013 0510   K 3.6 08/06/2013 0510   CL 99 08/06/2013 0510   CO2 23 08/06/2013 0510   GLUCOSE 97 08/06/2013 0510   BUN 15 08/06/2013 0510   CREATININE 0.96 08/06/2013 0510   CREATININE 1.16 07/21/2013 1126  CALCIUM 8.3* 08/06/2013 0510   GFRNONAA 74* 08/06/2013 0510   GFRAA 86* 08/06/2013 0510     Hepatic Function Panel     Component Value Date/Time   PROT 6.0 08/05/2013 2101     CBC    Component Value Date/Time   WBC 6.6 08/06/2013 0510   RBC 4.10* 08/06/2013 0510   HGB 13.3 08/06/2013 0510   HCT 36.9* 08/06/2013 0510   PLT 229 08/06/2013 0510   MCV 90.0 08/06/2013 0510   MCH 32.4 08/06/2013 0510   MCHC 36.0 08/06/2013 0510   RDW 12.9 08/06/2013 0510   LYMPHSABS 1.3 08/05/2013 2101   MONOABS 0.5 08/05/2013 2101   EOSABS 0.1 08/05/2013 2101   BASOSABS 0.0 08/05/2013 2101     BNP No results found for this basename: probnp    Lipid Panel     Component Value Date/Time   CHOL 162 04/06/2013 0850   TRIG 84 04/06/2013 0850   HDL 53 04/06/2013 0850   CHOLHDL 3.1 04/06/2013 0850   VLDL 17 04/06/2013 0850   LDLCALC 92 04/06/2013 0850     RADIOLOGY: No results found.    ASSESSMENT AND PLAN: Mr. Rossitto is maintaining normal sinus rhythm on his current dose of amidorone 300 mg daily he did have an isolated PVC. In the past he has been demonstrated to have sinus node dysfunction with both atrial  tachycardias as well as bradycardia arrhythmias. Presently, he does have significant lower extremity pretibial and ankle edema. I'm recommending discontinuance of his amlodipine. I've also recommended that he take furosemide 20 mg daily. His blood pressure is presently stable on his current medical regimen. If with the medication adjustment he notes his blood pressure starting to elevate in excess of 140 he will then titrate his hydralazine to 3 times per day. We also discussed sodium restriction. Also recommended 20-30 mm lower extremity support stockings below the knee. I've also suggested he followup with his ophthalmologist concerning his slight change in vision. He does have a history of glaucoma and it may be important to recheck his ocular pressures. I will see him in 6 weeks for followup evaluation prior to that office visit I will recheck his CBC CMP and TSH level.   Lennette Bihari, MD, [facility]  10/31/2013 11:48 AM

## 2013-11-10 ENCOUNTER — Telehealth: Payer: Self-pay | Admitting: Cardiovascular Disease

## 2013-11-10 NOTE — Telephone Encounter (Signed)
Please call asap-BP is acting up

## 2013-11-10 NOTE — Telephone Encounter (Signed)
Returned call.  Left message to call back before 4pm.  

## 2013-11-10 NOTE — Telephone Encounter (Signed)
Returned call and pt verified x 2.  Pt stated he just came from his PCP and his BP was low, 92/52 sitting, 130/72 HR 62 lying down (about 15 mins later) and 110/60 HR 78 standing..  Stated he told her he was having problems w/ his BP and she looked at his meds.  BP has been 80s-100s/40s-80s.  Stated she suggested he call his cardiologist.    Pt c/o feeling tired.  Stated the only change he has had is taking Cardura in the AM instead of PM now.  Pt advised to be sure he is taking in enough water as he is taking a fluid pill.  Informed Dr. Claiborne Billings will be notified for further instructions.  Pt verbalized understanding and agreed w/ plan.  Message printed and placed on Dr. Evette Georges cart for review and advice.

## 2013-11-11 ENCOUNTER — Telehealth: Payer: Self-pay | Admitting: Cardiovascular Disease

## 2013-11-11 MED ORDER — HYDRALAZINE HCL 25 MG PO TABS: 25.0000 mg | ORAL_TABLET | Freq: Two times a day (BID) | ORAL | Status: DC

## 2013-11-11 MED ORDER — AMIODARONE HCL 200 MG PO TABS: ORAL_TABLET | ORAL | Status: DC

## 2013-11-11 NOTE — Telephone Encounter (Signed)
Returned call.  Pt stated he received a call from the pharmacist in [place] and his medicines have to be updated, hydralazine and amiodarone.  Refill(s) sent to pharmacy w/ updated directions.  Pt verbalized understanding and agreed w/ plan.

## 2013-11-11 NOTE — Telephone Encounter (Signed)
Please call-some mix up about his prescription,says he will fill you in on it when you call.

## 2013-11-11 NOTE — Telephone Encounter (Signed)
Returned call and informed pt per instructions by MD/PA.  Pt verbalized understanding and agreed w/ plan.  Stated his PCP discussed that option as well and he feels good about it.

## 2013-11-11 NOTE — Telephone Encounter (Signed)
^^  Late Entry from 1.8.15^^  Dr. Claiborne Billings notified and advised pt take Cardura in the evenings and see if there are any improvements. ~ [place] M. Nicki Reaper, BSN, RN

## 2013-11-28 ENCOUNTER — Telehealth: Payer: Self-pay | Admitting: Cardiovascular Disease

## 2013-11-28 NOTE — Telephone Encounter (Signed)
Returned call and pt verified x 2.  Pt stated Dr. Claiborne Billings wanted labs drawn before his appt and Dr. Woody Seller wants labs drawn this week.  Pt wanted to know if he could get all of them done at one time.  Pt informed he can and advised to take lab order to lab to have them drawn at the same time.  Pt stated the lab Dr. Woody Seller uses is [facility] and order for [employer].  Pt informed RN can fax order, but pt did not know name of facility.  RN asked for Dr. Woody Seller' phone number: [phone].  Pt informed RN will take care of it and he can go to the [facility] lab as scheduled as RN will make sure labs Dr. Claiborne Billings needs are ordered.  Call to Dr. Woody Seller' office and left message for nurse to call back r/t pt's lab orders.

## 2013-11-28 NOTE — Telephone Encounter (Signed)
Please calll- concerning his ab order.

## 2013-11-28 NOTE — Telephone Encounter (Signed)
Call to Dr. Woody Seller' office and spoke w/ Otila Kluver.  Informed of labs needed to be coordinated for pt.  Stated they have orders for TSH.  Informed Dr. Claiborne Billings would like a copy of that as well as CBC and CMP.  Dx code: v40.69 given and Otila Kluver stated she will have MD add lab orders.  Call to pt and informed he stepped out.  Left message w/ Danton Clap, that labs will be ordered and Dr. Woody Seller' office will fax results to our office once received.  Verbalized understanding and will inform pt.

## 2013-12-03 ENCOUNTER — Telehealth: Payer: Self-pay | Admitting: Cardiology

## 2013-12-03 NOTE — Telephone Encounter (Signed)
Patient called, BP 214/130 with neck pain, this is after he took all his regular BP meds.   Instructed patient to go to closest ER ASAP. He understands the risks of delay with symptoms and severe hypertension.   Manus Gunning, MD.

## 2013-12-06 ENCOUNTER — Telehealth: Payer: Self-pay | Admitting: Cardiovascular Disease

## 2013-12-06 NOTE — Telephone Encounter (Signed)
Returned call and pt verified x 2.  Pt stated he has had a problem controlling his BP.  Stated his BP is okay in the AM and "skyrockets in the evening."  Per BP recordings, pt has been checking BP 4-6 times/day.  Reported BPs 140s-200s/60s-80s.  Called on-call MD on 1.31.15 and instructed to go to ER r/t elevated BP w/ neck warmness.  Pt stated he went to [facility] and was evaluated in ER.  Stated they told him to increase his hydralazine to three times a day, which was his previous dose.  Informed Dr. Claiborne Billings does not have any available appts sooner than his appt on Monday, but he can come in sooner to see the NP/PA.  Pt stated he will keep his appt on Monday w/ Dr. Claiborne Billings.  Pt agreed to call back w/ any problems and to check BP no more than three times daily, just before taking BP meds.    Pt informed med list will be updated and Dr. Claiborne Billings will be notified in case he would like to adjust meds further until seen.  Pt verbalized understanding and agreed w/ plan.  Pt reported BP (last 24 hrs): 126/67, 122/57, 155/71, 190/86, 83/46, 105/57, 143/70.  Message forwarded to Dr. Claiborne Billings.

## 2013-12-06 NOTE — Telephone Encounter (Signed)
Please call-concerning his high blood pressure.In the morning it is low and at night it goes up real high.

## 2013-12-12 ENCOUNTER — Encounter: Payer: Self-pay | Admitting: Cardiovascular Disease

## 2013-12-12 ENCOUNTER — Ambulatory Visit (INDEPENDENT_AMBULATORY_CARE_PROVIDER_SITE_OTHER): Payer: Medicare Other | Admitting: Cardiovascular Disease

## 2013-12-12 VITALS — BP 150/88 | HR 56 | Ht 68.0 in | Wt 160.4 lb

## 2013-12-12 DIAGNOSIS — Z7901 Long term (current) use of anticoagulants: Secondary | ICD-10-CM

## 2013-12-12 DIAGNOSIS — R600 Localized edema: Secondary | ICD-10-CM

## 2013-12-12 DIAGNOSIS — I480 Paroxysmal atrial fibrillation: Secondary | ICD-10-CM

## 2013-12-12 DIAGNOSIS — I495 Sick sinus syndrome: Secondary | ICD-10-CM

## 2013-12-12 DIAGNOSIS — I10 Essential (primary) hypertension: Secondary | ICD-10-CM

## 2013-12-12 DIAGNOSIS — I169 Hypertensive crisis, unspecified: Secondary | ICD-10-CM

## 2013-12-12 DIAGNOSIS — R0989 Other specified symptoms and signs involving the circulatory and respiratory systems: Secondary | ICD-10-CM

## 2013-12-12 DIAGNOSIS — I4891 Unspecified atrial fibrillation: Secondary | ICD-10-CM

## 2013-12-12 DIAGNOSIS — R609 Edema, unspecified: Secondary | ICD-10-CM

## 2013-12-12 DIAGNOSIS — I119 Hypertensive heart disease without heart failure: Secondary | ICD-10-CM

## 2013-12-12 NOTE — Progress Notes (Signed)
Patient ID: Dylan York, male   DOB: 1925/11/10, 80 y.o.   MRN: [id number]        HPI: Dylan York, is a 80 y.o. male who presents to the office for followup cardiology evaluation of his atrial fibrillation/sick sinus syndrome, lower extremity edema, and blood pressure lability.  [name] has a history of sick sinus syndrome in the past has felt episodes of atrial tachycardia as well as sinus bradycardia, ventricular bigeminy, and frequent PVCs.  He had  stabilized with Bystolic but when he was seen on 03/08/2013 he was in atrial fibrillation of questionable duration. At that time I increased his Bystolic and his started anticoagulation with Eliquis 2.5 mg twice a day. An echo Doppler study on 03/30/2013 revealed normal systolic function with an ejection fraction of 60-65%, aortic valve sclerosis, biatrial enlargement, as well as mild pulmonary hypertension with a PA pressure of 41 mm. On 04/04/2013 AF 77 rate was beats per minute. He was initially started  on Multaq 400 milligrams twice a day but did not tolerate this and ultimately discontinue its use.  When  l saw him on 04/26/2013 after much discussion I started him on amiodarone 200 mg daily. He has tolerated this well. 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  On 05/27/2013 I. further titrated his amiodarone to 200 mg twice a day. I also recommended that he reduce his Bystolic from 10 to 5 mg with a plan to do a cardioversion but as part of his preoperative assessment he was found to have pneumonia. Consequently his pneumonia was treated with antibiotic therapy by Dr. Woody Seller in [place]. On 08/05/2013 he was seen in the office as an add-on at which time he had significant accelerated hypertension with a blood pressure of 220/100. He was seen by Tenny Craw and admitted to [facility] hospital. He  was treated with IV Cardene and he converted to sinus  rhythm. Ultimately nicardipine was discontinued and amlodipine was started as well as hydralazine. He was discharged home on 08/09/2013. Since going home, he has taken his blood pressures religiously. These have been fairly well controlled. His heart rhythm has been fairly stable. He has noticed some mild ankle swelling right greater than left. Apparently he did see Dr. Woody Seller and lower extremityDoppler studies were negative for DVT. When I last saw him on 08/23/2013 1 amlodipine was from 10 to7.5 mg and reduced his amiodarone from 400 mg to 300 mg. Subsequently, he now is on a further reduce dose of amlodipine at just 2.5 mg daily. He does note continued increasing lower extremity edema, right leg greater than left. He has not been taking his furosemide  20 mg regularly.  When I last saw him in 10/31/2013 I discontinued his amlodipine and recommended furosemide 20 mg daily due to his significant lotion to pretibial and ankle edema. We also discussed sodium restriction. Since that time he has been using 20 -30 mm  support stockings below the knee with marked benefit.  He tells me he has been feeling but has noticed a pattern where his blood pressure typically is low in the morning and elevated at night. On 12/03/2013 he initially took his blood pressure at around 7 PM it was 159/69. Several hours later his systolic pressure was 381, an hour later it was 210. Ultimately presented to [facility] emergency room and his blood pressure was 240/110. Ultimately  his blood pressure stabilized. He has now been on  Hydralazine 25 mg every 8 hours instead of twice a day. Apparently he has been taking his Lotensin to 40 mg at night and Cardura 4 mg at night. Also has been taking his amiodarone 200 mg in the morning and 1 mg at night. He presents to the office today for evaluation. He also was recently started on Ativan 0.5 mg which he takes at bedtime.  Allergies  Allergen Reactions  . Multaq [Dronedarone] Shortness  Of Breath    Current Outpatient Prescriptions  Medication Sig Dispense Refill  . amiodarone (PACERONE) 200 MG tablet Take 200 mg in morning and 100 mg in evening  45 tablet  5  . apixaban (ELIQUIS) 2.5 MG TABS tablet Take 2.5 mg by mouth 2 (two) times daily.       . benazepril (LOTENSIN) 40 MG tablet Take 1 tablet (40 mg total) by mouth daily.  30 tablet  6  . doxazosin (CARDURA) 4 MG tablet Take 1 tablet (4 mg total) by mouth daily.  30 tablet  5  . finasteride (PROSCAR) 5 MG tablet Take 5 mg by mouth daily.      . furosemide (LASIX) 20 MG tablet Take 1 tablet by mouth daily.      . hydrALAZINE (APRESOLINE) 25 MG tablet Take 25 mg by mouth 3 (three) times daily. And as directed.      Marland Kitchen LORazepam (ATIVAN) 1 MG tablet Take 0.5 mg by mouth at bedtime.       . Multiple Vitamins-Minerals (MULTIVITAL PO) Take 1 tablet by mouth daily.       Marland Kitchen omeprazole (PRILOSEC) 20 MG capsule Take 20 mg by mouth daily before breakfast.      . potassium chloride (K-DUR,KLOR-CON) 10 MEQ tablet Take 1 tablet by mouth daily.      . TRAVATAN Z 0.004 % SOLN ophthalmic solution Place 1 drop into both eyes daily.       No current facility-administered medications for this visit.    Socially he is married and has 3 children he quit tobacco in 1978. 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  ROS is negative for fevers, chills or night sweats. He denies rash. He has noticed some slight visual change. He does have a history of glaucoma. There is no change in hearing. He denies lymphadenopathy. He denies cough. There is no wheezing. He denies PND orthopnea He denies recent tachycardia palpitations. There is no presyncope. He denies recent reflux he denies any heart failure. He denies syncope. He denies chest pressure. He denies bleeding. He denies abdominal pain. There is no nausea vomiting or diarrhea. He denies change in bowel bladder habits. His swelling improved with support  stockings but he has not used these for the last 2 days and he does admit to some mild recurrence of leg swelling. He denies arthralgias. He denies claudication. He has been using Ativan to help him with sleep. Other comprehensive 14 point system review is negative.   PE BP 150/88  Pulse 56  Ht 5\' 8"  (1.727 m)  Wt 160 lb 6.4 oz (72.757 kg)  BMI 24.39 kg/m2  Repeat blood pressure 174/86 General: Alert, oriented, no distress.  Skin: normal turgor, no rashes HEENT: Normocephalic, atraumatic. Pupils round and reactive; sclera anicteric;no lid lag.  Nose without nasal septal hypertrophy Mouth/Parynx benign; Mallinpatti scale 3 Neck: No JVD, no carotid bruits with normal carotid upstroke. Chest  wall: No tenderness to palpate Lungs: clear to ausculatation and percussion; no wheezing or rales Heart:  RRR with a ventricular rate approximately  60  no s3. 1/6 sem Abdomen: soft, nontender; no hepatosplenomehaly, BS+; abdominal aorta nontender and not dilated by palpation. No definitive renal artery bruit Back: No CVA tender Pulses 2+ Extremities: Trace edema above the sock line bilaterally, markedly improved from his last office visit ;no clubbing cyanosis, Homan's sign negative  Neurologic: grossly nonfocal; cranial nerves grossly normal Psychologic: Normal affect and mood  ECG  (independently read by me): Sinus rhythm at 56 beats per minute. PR interval 206 ms. QTc interval 470 ms   Prior ECG of 10/31/2013: Normal sinus rhythm at 61 beats per minute. Isolated  PVC.  Non-specific intra-ventricular conduction delay. Nonspecific ST-T changes. QTc interval 461 ms.  LABS:  BMET    Component Value Date/Time   NA 133* 08/06/2013 0510   K 3.6 08/06/2013 0510   CL 99 08/06/2013 0510   CO2 23 08/06/2013 0510   GLUCOSE 97 08/06/2013 0510   BUN 15 08/06/2013 0510   CREATININE 0.96 08/06/2013 0510   CREATININE 1.16 07/21/2013 1126   CALCIUM 8.3* 08/06/2013 0510   GFRNONAA 74* 08/06/2013 0510   GFRAA 86*  08/06/2013 0510     Hepatic Function Panel     Component Value Date/Time   PROT 6.0 08/05/2013 2101     CBC    Component Value Date/Time   WBC 6.6 08/06/2013 0510   RBC 4.10* 08/06/2013 0510   HGB 13.3 08/06/2013 0510   HCT 36.9* 08/06/2013 0510   PLT 229 08/06/2013 0510   MCV 90.0 08/06/2013 0510   MCH 32.4 08/06/2013 0510   MCHC 36.0 08/06/2013 0510   RDW 12.9 08/06/2013 0510   LYMPHSABS 1.3 08/05/2013 2101   MONOABS 0.5 08/05/2013 2101   EOSABS 0.1 08/05/2013 2101   BASOSABS 0.0 08/05/2013 2101     BNP No results found for this basename: probnp    Lipid Panel     Component Value Date/Time   CHOL 162 04/06/2013 0850   TRIG 84 04/06/2013 0850   HDL 53 04/06/2013 0850   CHOLHDL 3.1 04/06/2013 0850   VLDL 17 04/06/2013 0850   LDLCALC 92 04/06/2013 0850     RADIOLOGY: No results found.    ASSESSMENT AND PLAN: Mr. Przywara is maintaining normal sinus rhythm on his current dose of amiodarone which he is taking 200 mg in the morning and 100 mg in the evening. He is on eliquis 2.5 mg for anticoagulation. I am recommending heat reduce his amiodarone to 200 mg daily. He continues to have recurrent episodes of somewhat labile, accelerated hypertension. He has noticed a trend where his blood pressure has been typically low morning and elevated in the evening before taking his evening medications. I suggested that he change his Lotensin from his current 40 mg which he has been taking at bedtime to 20 mg twice a day. He will continue the Cardura at 4 mg at bedtime. His blood pressure on recheck by me today was 174/86. I'm also further titrating his hydralazine to 37.5 mg 3 times a day. I have recommended that he continue to wear support stocking since he does have mild pretibial edema above the sock line. I'm also scheduling him for renal duplex scan to make certain he does not have renal artery stenosis which may be contributing to recent accelerated hypertension. He apparently just had laboratory at  [facility] last week and also  by his primary physician. BUN 13 Cr1.28, potassium 3.7. Normal LFTs. Hemoglobin 13.1 hematocrit 38.0. I do not see thyroid function studies I will see him in the office in 4 weeks for followup cardiologyevaluation.   Troy Sine, MD, [facility]  12/12/2013 12:04 PM

## 2013-12-12 NOTE — Patient Instructions (Addendum)
Your physician has recommended you make the following change in your medication: decrease the amiodarone to 200 mg daily.   Change the benazepril 40 mg to 1/2 tablet twice daily instead of 1 tablet daily.  Increase the hydralazine 1 & 1/2 tablet  Three times a day.  Your physician has requested that you have a renal artery duplex. During this test, an ultrasound is used to evaluate blood flow to the kidneys. Allow one hour for this exam. Do not eat after midnight the day before and avoid carbonated beverages. Take your medications as you usually do.  Your physician recommends that you schedule a follow-up appointment in: 4 weeks.

## 2013-12-15 ENCOUNTER — Telehealth: Payer: Self-pay | Admitting: Cardiovascular Disease

## 2013-12-15 NOTE — Telephone Encounter (Signed)
Returned call and pt verified x 2.  Pt stated his BP is low: 77/42 and he feels like he has no energy and faint.  Pt c/o low AM BPs in 90s/40s-50s and stated it goes up later in the day.  Pt c/o feeling "like a dish rag" in the AM w/ the low BPs.  Hydralazine was increase at OV on Monday and benazapril was changed from 40 mg daily to 20 mg twice daily.  Pt informed Dr. Claiborne Billings is out of the office today and Erasmo Downer, our pharmacist, will be notified to review and advise.  Advice  Orthostatic precautions  Hydration  Pt verbalized understanding and agreed w/ plan.  Message forwarded to Tommy Medal, PharmD.

## 2013-12-15 NOTE — Telephone Encounter (Signed)
Returned call to pt, he is having hypotension in the mornings, with

## 2013-12-15 NOTE — Telephone Encounter (Signed)
Please call,bloos pressure is real low 77/42.

## 2013-12-16 NOTE — Telephone Encounter (Signed)
(  con't) BP as low as 77/42, but evenings still 811-572I systolic.  Pt currently taking hydralazine 37.5 tid, benazepril 20mg  bid, and doxazosin 4mg  qhs.  (From visit with Dr. Claiborne Billings on 2/9).  I asked pt to decrease evening hydralazine to 25mg  and doxazosin to 2mg .  Continue monitoring home BP and call next week with updated readings.  Pt voiced understanding.

## 2013-12-21 ENCOUNTER — Telehealth: Payer: Self-pay | Admitting: Pharmacist Clinician (PhC)/ Clinical Pharmacy Specialist

## 2013-12-21 NOTE — Telephone Encounter (Signed)
Spoke with patient again,  BP doing better in that no hypotension since we spoke on Friday..  At that time I cut doxazosin dose in 1/2 (4mg  to 2mg ) and had him cut evening hydralazine from 37.5 to 25mg .  Now his am pressures are in the 170-190s/80-90, instead of systolics <948.  Today I asked him to leave the doxazosin at 2mg  but increase the hydralazine back to 37.5mg  in the evenings.  Pt still taking benazepril 20mg  bid.  He is coming in on Tuesday for Renal artery dopplers, asked that he bring his BP cuff so that we might check its accuracy while he's here.  Pt voiced understanding

## 2013-12-22 ENCOUNTER — Telehealth (HOSPITAL_COMMUNITY): Payer: Self-pay | Admitting: *Deleted

## 2013-12-27 ENCOUNTER — Encounter (HOSPITAL_COMMUNITY): Payer: Medicare Other

## 2013-12-28 ENCOUNTER — Ambulatory Visit (HOSPITAL_COMMUNITY)
Admission: RE | Admit: 2013-12-28 | Discharge: 2013-12-28 | Disposition: A | Discharge status: Home / Self Care | Payer: Medicare Other | Source: Ambulatory Visit | Attending: Cardiovascular Disease | Admitting: Cardiovascular Disease

## 2013-12-28 DIAGNOSIS — I10 Essential (primary) hypertension: Secondary | ICD-10-CM | POA: Insufficient documentation

## 2013-12-28 DIAGNOSIS — I4891 Unspecified atrial fibrillation: Secondary | ICD-10-CM

## 2013-12-28 DIAGNOSIS — R609 Edema, unspecified: Secondary | ICD-10-CM

## 2013-12-28 DIAGNOSIS — I119 Hypertensive heart disease without heart failure: Secondary | ICD-10-CM

## 2013-12-28 NOTE — Progress Notes (Signed)
Renal Duplex Completed. Aikeem Lilley, BS, RDMS, RVT  

## 2014-01-06 ENCOUNTER — Telehealth: Payer: Self-pay | Admitting: Cardiovascular Disease

## 2014-01-06 NOTE — Telephone Encounter (Signed)
Would like ultrasound results from Monday please.

## 2014-01-06 NOTE — Telephone Encounter (Signed)
Returned call and spoke w/ pt.  Informed results have not been reviewed by MD and nurse will call, mail letter or release in [place] after they are reviewed.  Pt verbalized understanding and agreed w/ plan.  Pt has an appt on Tuesday and will get results then.

## 2014-01-10 ENCOUNTER — Ambulatory Visit (INDEPENDENT_AMBULATORY_CARE_PROVIDER_SITE_OTHER): Payer: Medicare Other | Admitting: Cardiovascular Disease

## 2014-01-10 VITALS — BP 174/100 | HR 53 | Ht 68.0 in | Wt 163.6 lb

## 2014-01-10 DIAGNOSIS — K219 Gastro-esophageal reflux disease without esophagitis: Secondary | ICD-10-CM

## 2014-01-10 DIAGNOSIS — I4891 Unspecified atrial fibrillation: Secondary | ICD-10-CM

## 2014-01-10 DIAGNOSIS — I480 Paroxysmal atrial fibrillation: Secondary | ICD-10-CM

## 2014-01-10 DIAGNOSIS — N281 Cyst of kidney, acquired: Secondary | ICD-10-CM

## 2014-01-10 DIAGNOSIS — Q619 Cystic kidney disease, unspecified: Secondary | ICD-10-CM

## 2014-01-10 DIAGNOSIS — I119 Hypertensive heart disease without heart failure: Secondary | ICD-10-CM

## 2014-01-10 DIAGNOSIS — I495 Sick sinus syndrome: Secondary | ICD-10-CM

## 2014-01-10 MED ORDER — HYDRALAZINE HCL 50 MG PO TABS: 50.0000 mg | ORAL_TABLET | Freq: Three times a day (TID) | ORAL | Status: DC

## 2014-01-10 MED ORDER — FUROSEMIDE 20 MG PO TABS: 20.0000 mg | ORAL_TABLET | Freq: Every day | ORAL | Status: DC

## 2014-01-10 NOTE — Patient Instructions (Addendum)
Your physician has recommended you make the following change in your medication: increase the hydralazine to every 8 hours. Increase the furosemide to 20 mg daily.  Your physician recommends that you schedule a follow-up appointment in: 4 weeks with [facility] and 8 weeks with Dr. Claiborne Billings.

## 2014-01-17 ENCOUNTER — Encounter: Payer: Self-pay | Admitting: *Deleted

## 2014-01-17 ENCOUNTER — Encounter: Payer: Self-pay | Admitting: Cardiovascular Disease

## 2014-01-17 ENCOUNTER — Telehealth: Payer: Self-pay | Admitting: *Deleted

## 2014-01-17 NOTE — Telephone Encounter (Signed)
Called patient to give renal doppler results. No answer. Will send patient a result letter.

## 2014-01-17 NOTE — Progress Notes (Signed)
Patient ID: Dylan York, male   DOB: May 06, 1926, 80 y.o.   MRN: [id number]       HPI: Dylan York is a 80 y.o. male who presents to the office for one month  followup cardiology evaluation of his blood pressure lability.  Dylan York has a history of sick sinus syndrome with atrial tachycardia as well as sinus bradycardia, ventricular bigeminy, and frequent PVCs.  He had  stabilized with Bystolic but when he was seen on 03/08/2013 he was in atrial fibrillation of questionable duration. At that time I increased his Bystolic and his started anticoagulation with Eliquis 2.5 mg twice a day. An echo Doppler study on 03/30/2013 revealed normal systolic function with an ejection fraction of 60-65%, aortic valve sclerosis, biatrial enlargement, as well as mild pulmonary hypertension with a PA pressure of 41 mm. On 04/04/2013 AF 77 rate was beats per minute. He was initially started  on Multaq 400 milligrams twice a day but did not tolerate this and ultimately discontinue its use.  When  l saw him on 04/26/2013 I started him on amiodarone 200 mg daily. He has tolerated this well. 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  On 05/27/2013 I. further titrated his amiodarone to 200 mg twice a day. I also recommended that he reduce his Bystolic from 10 to 5 mg with a plan to do a cardioversion but as part of his preoperative assessment he was found to have pneumonia. His  pneumonia was treated with antibiotic therapy by Dr. Woody Seller in [place]. On 08/05/2013 he was seen in the office as an add-on at which time he had significant accelerated hypertension with a blood pressure of 220/100. He was seen by [name] and admitted to [facility] hospital. He  was treated with IV Cardene and he converted to sinus rhythm. Ultimately nicardipine was discontinued and amlodipine was started as well as hydralazine. He was discharged home  on 08/09/2013. Since going home, he has taken his blood pressures religiously. These have been fairly well controlled. His heart rhythm has been fairly stable. He has noticed some mild ankle swelling right greater than left. Lower extremityDoppler studies were negative for DVT. On 08/23/2013  amlodipine was reduced from 10 to7.5 mg and I reduced his amiodarone from 400 mg to 300 mg. Subsequently, he now is on a further reduce dose of amlodipine at just 2.5 mg daily. He does note continued increasing lower extremity edema, right leg greater than left. He has not been taking his furosemide  20 mg regularly.  On 10/31/2013 I discontinued his amlodipine and recommended furosemide 20 mg daily due to his significant  pretibial and ankle edema. We also discussed sodium restriction. Since that time he has been using 20 -30 mm  support stockings below the knee with marked benefit.  Last saw him on 12/12/2013 at which time he had noticed a pattern where his blood pressure typically is low in the morning and elevated at night. On 12/03/2013 he initially took his blood pressure at around 7 PM it was 159/69. Several hours later his systolic pressure was [id number], an hour later it was 210. He presented to [facility] emergency room and his blood pressure was 240/110. Ultimately his blood pressure stabilized. I last saw him, his blood pressure was still elevated and I further titrated his hydralazine to 37.5 mg every 8 hours and suggested that he  change his Lotensin to 20 mg twice a day and continue his Cardura 4 mg at bedtime. I recommended a renal duplex scan to make certain he does not have renal artery stenosis.  He presents to the office today for followup evaluation. He is unaware of any recurrent atrial fibrillation. He denies any presyncope or syncope. He denies shortness of breath. He denies palpitations.  Allergies  Allergen Reactions  . Multaq [Dronedarone] Shortness Of Breath    Current Outpatient  Prescriptions  Medication Sig Dispense Refill  . amiodarone (PACERONE) 200 MG tablet 200 mg daily.      Marland Kitchen apixaban (ELIQUIS) 2.5 MG TABS tablet Take 2.5 mg by mouth 2 (two) times daily.       . benazepril (LOTENSIN) 40 MG tablet Take 40 mg by mouth. 1/2 tablet twice daily      . doxazosin (CARDURA) 4 MG tablet Take 4 mg by mouth at bedtime.      . finasteride (PROSCAR) 5 MG tablet Take 5 mg by mouth daily.      . furosemide (LASIX) 20 MG tablet Take 1 tablet (20 mg total) by mouth daily.  30 tablet  6  . LORazepam (ATIVAN) 1 MG tablet Take 0.5 mg by mouth at bedtime.       . Multiple Vitamins-Minerals (MULTIVITAL PO) Take 1 tablet by mouth daily.       Marland Kitchen omeprazole (PRILOSEC) 20 MG capsule Take 20 mg by mouth daily before breakfast.      . potassium chloride (K-DUR,KLOR-CON) 10 MEQ tablet Take 1 tablet by mouth daily.      . TRAVATAN Z 0.004 % SOLN ophthalmic solution Place 1 drop into both eyes daily.      . hydrALAZINE (APRESOLINE) 50 MG tablet Take 1 tablet (50 mg total) by mouth 3 (three) times daily.  90 tablet  6   No current facility-administered medications for this visit.    Socially he is married and has 3 children he quit tobacco in 1978. 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  ROS is negative for fevers, chills or night sweats. He denies rash. He has noticed some slight visual change. He does have a history of glaucoma. There is no change in hearing. He denies lymphadenopathy. He denies cough. There is no wheezing. He denies PND orthopnea He denies recent  palpitations. There is no presyncope. He denies recent reflux he denies any heart failure. He denies syncope. He denies chest pressure. He denies bleeding. He denies abdominal pain. There is no nausea vomiting or diarrhea. He denies change in bowel bladder habits. His swelling improved with support stockings but he has not used these for the last 2 days and he does admit to some mild  recurrence of leg swelling. He denies arthralgias. He denies claudication. He has been using Ativan to help him with sleep. Other comprehensive 14 point system review is negative.   PE BP 174/100  Pulse 53  Ht 5\' 8"  (1.727 m)  Wt 163 lb 9.6 oz (74.208 kg)  BMI 24.88 kg/m2  Repeat blood pressure by me was 178/98 General: Alert, oriented, no distress.  Skin: normal turgor, no rashes HEENT: Normocephalic, atraumatic. Pupils round and reactive; sclera anicteric;no lid lag.  Nose without nasal septal hypertrophy Mouth/Parynx benign; Mallinpatti scale 3 Neck: No JVD, no carotid bruits with normal carotid upstroke. Chest wall: No tenderness to palpate Lungs: clear to ausculatation and percussion; no wheezing or rales Heart:  RRR with a ventricular rate approximately  in mid 50's; no s3. 1/6 sem; no diastolic murmur. No rubs thrills or heaves. Abdomen: soft, nontender; no hepatosplenomehaly, BS+; abdominal aorta nontender and not dilated by palpation. No definitive renal artery bruit Back: No CVA tender Pulses 2+ Extremities: Trace edema above the sock line bilaterally, markedly improved from his last office visit ;no clubbing cyanosis, Homan's sign negative  Neurologic: grossly nonfocal; cranial nerves grossly normal Psychologic: Normal affect and mood  ECG (independently read by me) sinus bradycardia 53 beats per minute. QTc interval 457 ms. No ectopy.  Prior 12/12/2013 ECG  (independently read by me): Sinus rhythm at 56 beats per minute. PR interval 206 ms. QTc interval 470 ms   Prior ECG of 10/31/2013: Normal sinus rhythm at 61 beats per minute. Isolated  PVC.  Non-specific intra-ventricular conduction delay. Nonspecific ST-T changes. QTc interval 461 ms.  LABS:  BMET    Component Value Date/Time   NA 133* 08/06/2013 0510   K 3.6 08/06/2013 0510   CL 99 08/06/2013 0510   CO2 23 08/06/2013 0510   GLUCOSE 97 08/06/2013 0510   BUN 15 08/06/2013 0510   CREATININE 0.96 08/06/2013 0510    CREATININE 1.16 07/21/2013 1126   CALCIUM 8.3* 08/06/2013 0510   GFRNONAA 74* 08/06/2013 0510   GFRAA 86* 08/06/2013 0510     Hepatic Function Panel     Component Value Date/Time   PROT 6.0 08/05/2013 2101     CBC    Component Value Date/Time   WBC 6.6 08/06/2013 0510   RBC 4.10* 08/06/2013 0510   HGB 13.3 08/06/2013 0510   HCT 36.9* 08/06/2013 0510   PLT 229 08/06/2013 0510   MCV 90.0 08/06/2013 0510   MCH 32.4 08/06/2013 0510   MCHC 36.0 08/06/2013 0510   RDW 12.9 08/06/2013 0510   LYMPHSABS 1.3 08/05/2013 2101   MONOABS 0.5 08/05/2013 2101   EOSABS 0.1 08/05/2013 2101   BASOSABS 0.0 08/05/2013 2101     BNP No results found for this basename: probnp    Lipid Panel     Component Value Date/Time   CHOL 162 04/06/2013 0850   TRIG 84 04/06/2013 0850   HDL 53 04/06/2013 0850   CHOLHDL 3.1 04/06/2013 0850   VLDL 17 04/06/2013 0850   LDLCALC 92 04/06/2013 0850     RADIOLOGY: No results found.    ASSESSMENT AND PLAN: Mr. Vitanza is maintaining normal sinus rhythm on his current dose of amiodarone which he is taking 200 mg daily and is tolerating eliquis at 2.5 mg twice a day for anticoagulation.  His blood pressure continues to be elevated and is stage II. He has been taking Lotensin 20 mg twice a day, Lasix 20 mg 3 days per week and hydralazine 37.5 mg every 8 hours in addition to doxazosin 4 mg at bedtime. I have recommended that he change his Lasix to 20 mg daily and I am further titrating his hydralazine to 50 mg every 8 hours. He did undergo a renal duplex scan which revealed bilateral renal cysts. He did have right renal artery stenosis most likely in the 50 to less than 60% range. He is not having any chest pain. He's not having any bleeding. His edema has improved although is still mild but should further improve with the daily Lasix therapy. I recommended that he see Dylan York in her Pharm.D. in our office in 4 weeks for followup of his blood pressure lability. I will see him in 8 weeks  for  cardiology reassessment.   Troy Sine, MD, [facility]  01/17/2014 9:32 PM

## 2014-01-18 ENCOUNTER — Ambulatory Visit: Payer: Medicare Other | Admitting: Pharmacist Clinician (PhC)/ Clinical Pharmacy Specialist

## 2014-01-23 ENCOUNTER — Telehealth: Payer: Self-pay | Admitting: *Deleted

## 2014-01-23 ENCOUNTER — Other Ambulatory Visit: Payer: Self-pay

## 2014-01-23 MED ORDER — APIXABAN 2.5 MG PO TABS: 2.5000 mg | ORAL_TABLET | Freq: Two times a day (BID) | ORAL | Status: DC

## 2014-01-23 NOTE — Telephone Encounter (Signed)
Pt was calling to speak to you about his erratic bp.  TK

## 2014-01-23 NOTE — Telephone Encounter (Signed)
Rx was sent to pharmacy electronically. 

## 2014-01-24 NOTE — Telephone Encounter (Signed)
Reviewed BP readings with patient, his am average is 130/63, midday 116/56 and evening 156/78.  I suggested he increase mid-day hydralazine dose to 75mg  and continue monitoring.  If evening BP improves he is to continue until next appointment on 4/8.  Otherwise call in 1 week

## 2014-01-26 ENCOUNTER — Other Ambulatory Visit: Payer: Self-pay | Admitting: Internal Medicine

## 2014-01-26 DIAGNOSIS — R918 Other nonspecific abnormal finding of lung field: Secondary | ICD-10-CM

## 2014-01-31 NOTE — Addendum Note (Signed)
Addended byLauralee Evener. on: 01/31/2014 04:58 PM   Modules accepted: Orders

## 2014-02-08 ENCOUNTER — Ambulatory Visit (INDEPENDENT_AMBULATORY_CARE_PROVIDER_SITE_OTHER): Payer: Medicare Other | Admitting: Pharmacist Clinician (PhC)/ Clinical Pharmacy Specialist

## 2014-02-08 ENCOUNTER — Other Ambulatory Visit: Payer: Self-pay | Admitting: Internal Medicine

## 2014-02-08 ENCOUNTER — Ambulatory Visit
Admission: RE | Admit: 2014-02-08 | Discharge: 2014-02-08 | Disposition: A | Discharge status: Home / Self Care | Payer: Medicare Other | Source: Ambulatory Visit | Attending: Internal Medicine | Admitting: Internal Medicine

## 2014-02-08 VITALS — BP 128/62 | HR 76 | Ht 68.0 in | Wt 162.2 lb

## 2014-02-08 DIAGNOSIS — I10 Essential (primary) hypertension: Secondary | ICD-10-CM

## 2014-02-08 DIAGNOSIS — R918 Other nonspecific abnormal finding of lung field: Secondary | ICD-10-CM

## 2014-02-08 MED ORDER — VALSARTAN 320 MG PO TABS: 320.0000 mg | ORAL_TABLET | Freq: Every day | ORAL | Status: DC

## 2014-02-08 NOTE — Patient Instructions (Signed)
Return for a a follow up appointment with Dr. Claiborne Billings on May 5  Your blood pressure today is good at 128/62  Check your blood pressure at home daily (if able) and keep record of the readings.  Take your BP meds as follows continue with hydralazine 50/75/50.  Switch lotensin from 20mg  twice daily to valsartan 320mg  each morning  Bring all of your meds, your BP cuff and your record of home blood pressures to your next appointment.  Exercise as you're able, try to walk approximately 30 minutes per day.  Keep salt intake to a minimum, especially watch canned and prepared boxed foods.  Eat more fresh fruits and vegetables and fewer canned items.  Avoid eating in fast food restaurants.    HOW TO TAKE YOUR BLOOD PRESSURE:   Rest 5 minutes before taking your blood pressure.    Don't smoke or drink caffeinated beverages for at least 30 minutes before.   Take your blood pressure before (not after) you eat.   Sit comfortably with your back supported and both feet on the floor (don't cross your legs).   Elevate your arm to heart level on a table or a desk.   Use the proper sized cuff. It should fit smoothly and snugly around your bare upper arm. There should be enough room to slip a fingertip under the cuff. The bottom edge of the cuff should be 1 inch above the crease of the elbow.   Ideally, take 3 measurements at one sitting and record the average.

## 2014-02-09 ENCOUNTER — Encounter: Payer: Self-pay | Admitting: Pharmacist Clinician (PhC)/ Clinical Pharmacy Specialist

## 2014-02-09 NOTE — Progress Notes (Signed)
02/09/2014 Dylan York 05/18/1926 [id number]   HPI:  Dylan York is a 80 y.o. male patient of Dr Claiborne Billings with a PMH below who presents today for a blood pressure check.  Dylan York has been meticulously recording his blood pressure at least 3 times a day for quite some time.  He worries when the reading are greater than 150/90, which may be contributing to some of the higher readings.  He seems to have a pattern of normal readings throughout the day, but his evening readings (usually 11pm-12am) tend to run in the 989-211 range systolic.  We recently increased his mid-day hydralazine dose from 50mg  to 75mg , leaving morning and evening doses at 50mg .  The first day he did this, his evening reading dropped to 941 systolic.  After that they returned to the higher numbers.  He states compliance with all medications.  Renal dopplers done in February show only mild R renal artery stenosis.  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  Of note he recently was started on levothyroxine 50 mcg daily.     Current Outpatient Prescriptions  Medication Sig Dispense Refill  . amiodarone (PACERONE) 200 MG tablet 200 mg daily.      Marland Kitchen apixaban (ELIQUIS) 2.5 MG TABS tablet Take 1 tablet (2.5 mg total) by mouth 2 (two) times daily.  60 tablet  11  . doxazosin (CARDURA) 4 MG tablet Take 4 mg by mouth at bedtime.      . finasteride (PROSCAR) 5 MG tablet Take 5 mg by mouth daily.      . furosemide (LASIX) 20 MG tablet Take 1 tablet (20 mg total) by mouth daily.  30 tablet  6  . hydrALAZINE (APRESOLINE) 50 MG tablet Take 50 mg by mouth 3 (three) times daily. Take 50mg  twice daily, 75mg  mid-day      . levothyroxine (SYNTHROID, LEVOTHROID) 50 MCG tablet Take 50 mcg by mouth daily.      Marland Kitchen LORazepam (ATIVAN) 1 MG tablet Take 0.5 mg by mouth at bedtime.       . Multiple Vitamins-Minerals (MULTIVITAL PO) Take 1 tablet by mouth daily.       Marland Kitchen omeprazole (PRILOSEC) 20 MG capsule Take 20 mg by  mouth daily before breakfast.      . potassium chloride (K-DUR,KLOR-CON) 10 MEQ tablet Take 1 tablet by mouth daily.      . TRAVATAN Z 0.004 % SOLN ophthalmic solution Place 1 drop into both eyes daily.      . valsartan (DIOVAN) 320 MG tablet Take 1 tablet (320 mg total) by mouth daily.  30 tablet  1   No current facility-administered medications for this visit.    Allergies  Allergen Reactions  . Multaq [Dronedarone] Shortness Of Breath    Past Medical History  Diagnosis Date  . Hypertension   . Tricuspid regurgitation   . GERD (gastroesophageal reflux disease)   . Atrial tachycardia   . Pneumonia   . BPH (benign prostatic hyperplasia)   . Sinus bradycardia, with HR in the 40s BB stopped. 08/08/2013  . Thyroid disease     Blood pressure 128/62, pulse 76, height 5\' 8"  (1.727 m), weight 162 lb 3.2 oz (73.573 kg). Standing 118/64   ASSESSMENT AND PLAN:  Dylan York seems to have very normal blood pressures during the day, but they continue to rise in the evenings, to has high as 200/100.  By mornings they have come back down  to average in the 130s.  He is currently taking benazepril 20mg  twice daily, hydralazine 3 times daily (50/75/50) and doxazosin 4 mg each evening.  He previously took amlodipine, but it was discontinued due to lower extremity edema.  I have not been able to find a reason for the evening rise in BP.  I suggested to the patient that we might try switching benazepril to a once daily ARB to see if we can get better results.  We will try him on valsartan 320mg  once daily in the mornings.  I'm not sure that his insurance will cover the medication, if not, we will use irbesartan 300 mg instead.  Pt is to contact pharmacy for coverage on valsartan before picking it up.  He is due to see Dr. Claiborne Billings in another month, we will review his BP at that time.    Tommy Medal PharmD CPP [facility]

## 2014-03-02 ENCOUNTER — Telehealth: Payer: Self-pay | Admitting: Pharmacist Clinician (PhC)/ Clinical Pharmacy Specialist

## 2014-03-02 NOTE — Telephone Encounter (Signed)
Pt sent letter with BP readings, doing better but still having low readings in his noon-6pm range.  Lowest was 87/48, avg 117/56.  Pt also included list of meds and times he takes them  Will suggest that pt move his proscar to evenings from morning.  It also has potential to cause othostasis.

## 2014-03-02 NOTE — Telephone Encounter (Signed)
Pt aware to move proscar to evenings.  Advised to watch for hypotension due to both proscar and cardura taken at hs.  Pt agreed to continue with BP checks.  Will call if concerns

## 2014-03-07 ENCOUNTER — Ambulatory Visit: Payer: Medicare Other | Admitting: Cardiovascular Disease

## 2014-04-03 ENCOUNTER — Other Ambulatory Visit: Payer: Self-pay | Admitting: *Deleted

## 2014-04-03 MED ORDER — VALSARTAN 320 MG PO TABS: 320.0000 mg | ORAL_TABLET | Freq: Every day | ORAL | Status: DC

## 2014-04-03 NOTE — Telephone Encounter (Signed)
Rx was sent to pharmacy electronically. 

## 2014-04-08 ENCOUNTER — Other Ambulatory Visit: Payer: Self-pay | Admitting: Cardiovascular Disease

## 2014-04-10 ENCOUNTER — Telehealth: Payer: Self-pay | Admitting: Physician Assistant

## 2014-04-10 MED ORDER — DOXAZOSIN MESYLATE 4 MG PO TABS: 4.0000 mg | ORAL_TABLET | Freq: Every day | ORAL | Status: DC

## 2014-04-10 NOTE — Telephone Encounter (Signed)
Need a new prescription for Doxazosin 4 mg #30. Please call to [facility] [phone].

## 2014-04-10 NOTE — Telephone Encounter (Signed)
Rx was sent to pharmacy electronically. 

## 2014-04-17 ENCOUNTER — Telehealth: Payer: Self-pay | Admitting: Cardiovascular Disease

## 2014-04-17 NOTE — Telephone Encounter (Signed)
BP's since Saturday have been running from 123/62 - 211/91. 04/13/14     AM 123/62    PM  170/73 04/14/14    AM 176/88  PM  145/68 04/15/14    AM 188/86 04/16/14    AM  186/88  Noon: 152/82  PM 139/61 04/17/14    AM   202/90    PM   211/91 Will review with Erasmo Downer and get back with patient about med changes.  Voiced understanding.

## 2014-04-17 NOTE — Telephone Encounter (Signed)
Please call,pt blood pressure is up to 211/91.

## 2014-04-18 MED ORDER — CLONIDINE HCL 0.1 MG PO TABS: ORAL_TABLET | ORAL | Status: DC

## 2014-04-18 NOTE — Telephone Encounter (Signed)
Reviewed BP readings with patient.  Although some recent high am readings, lunch time readings have been as low as 100/60.  Pt concerned as he works in [place] and needs to be outside during summer heat.  Low BP readings make him feel weak/dizzy.  He has held AM hydralazine for past several days.    Advised that he change AM hydralazine to 25mg  for next several weeks until done with strenuous outdoor work.  Will also send in Rx for clonidine 0.1mg  to use prn BP >190/100, no more than 1 tab daily or 3/week.  Pt voiced understanding.  He will call in 2 weeks to share BP readings.  Has appt with Dr. Claiborne Billings on 7/20

## 2014-04-28 ENCOUNTER — Other Ambulatory Visit (INDEPENDENT_AMBULATORY_CARE_PROVIDER_SITE_OTHER): Payer: Self-pay | Admitting: Internal Medicine

## 2014-04-28 NOTE — Telephone Encounter (Signed)
May refill per Dr.Rehman. The patient will need an office visit for future refills or may get from PCP.

## 2014-05-22 ENCOUNTER — Encounter: Payer: Self-pay | Admitting: Cardiovascular Disease

## 2014-05-22 ENCOUNTER — Ambulatory Visit (INDEPENDENT_AMBULATORY_CARE_PROVIDER_SITE_OTHER): Payer: Medicare Other | Admitting: Cardiovascular Disease

## 2014-05-22 VITALS — BP 150/72 | HR 55 | Ht 68.0 in | Wt 162.5 lb

## 2014-05-22 DIAGNOSIS — R5383 Other fatigue: Secondary | ICD-10-CM

## 2014-05-22 DIAGNOSIS — Q619 Cystic kidney disease, unspecified: Secondary | ICD-10-CM

## 2014-05-22 DIAGNOSIS — Z79899 Other long term (current) drug therapy: Secondary | ICD-10-CM

## 2014-05-22 DIAGNOSIS — R5381 Other malaise: Secondary | ICD-10-CM

## 2014-05-22 DIAGNOSIS — Z7901 Long term (current) use of anticoagulants: Secondary | ICD-10-CM

## 2014-05-22 DIAGNOSIS — I480 Paroxysmal atrial fibrillation: Secondary | ICD-10-CM

## 2014-05-22 DIAGNOSIS — I4891 Unspecified atrial fibrillation: Secondary | ICD-10-CM

## 2014-05-22 DIAGNOSIS — I119 Hypertensive heart disease without heart failure: Secondary | ICD-10-CM

## 2014-05-22 DIAGNOSIS — N281 Cyst of kidney, acquired: Secondary | ICD-10-CM

## 2014-05-22 DIAGNOSIS — E782 Mixed hyperlipidemia: Secondary | ICD-10-CM

## 2014-05-22 MED ORDER — DOXAZOSIN MESYLATE 4 MG PO TABS: 4.0000 mg | ORAL_TABLET | Freq: Every day | ORAL | Status: DC

## 2014-05-22 MED ORDER — HYDRALAZINE HCL 50 MG PO TABS
50.0000 mg | ORAL_TABLET | Freq: Three times a day (TID) | ORAL | Status: DC
Start: 2014-05-22 — End: 2014-09-25

## 2014-05-22 NOTE — Patient Instructions (Signed)
Take Hydralazine 50mg  three times a day.  Increase Cardura (Doxazosin) to 4 mg daily.  Your physician recommends that you return for lab work in: FASTING blood work at [facility].  Your physician recommends that you schedule a follow-up appointment in: 6 weeks with Erasmo Downer, PharmD.  Your physician recommends that you schedule a follow-up appointment in:  Dr. Claiborne Billings in 4 months.

## 2014-05-24 ENCOUNTER — Encounter: Payer: Self-pay | Admitting: Cardiovascular Disease

## 2014-05-24 NOTE — Progress Notes (Signed)
Patient ID: Dylan York, male   DOB: 01-19-1926, 80 y.o.   MRN: [id number]       HPI: Dylan York is a 80 y.o. male who presents to the office for 4 month  followup cardiology evaluation of his blood pressure lability.  Dylan York has a history of sick sinus syndrome with atrial tachycardia as well as sinus bradycardia, ventricular bigeminy, and frequent PVCs.  He had  stabilized with Bystolic but when he was seen on 03/08/2013 he was in atrial fibrillation of questionable duration. At that time I increased his Bystolic and his started anticoagulation with Eliquis 2.5 mg twice a day. An echo Doppler study on 03/30/2013 revealed normal systolic function with an ejection fraction of 60-65%, aortic valve sclerosis, biatrial enlargement, as well as mild pulmonary hypertension with a PA pressure of 41 mm. On 04/04/2013 AF 77 rate was beats per minute. He was initially started  on Multaq 400 milligrams twice a day but did not tolerate this and ultimately discontinue its use.  When  l saw him on 04/26/2013 I started him on amiodarone 200 mg daily. He has tolerated this well. 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  On 05/27/2013 I. further titrated his amiodarone to 200 mg twice a day. I also recommended that he reduce his Bystolic from 10 to 5 mg with a plan to do a cardioversion but as part of his preoperative assessment he was found to have pneumonia. His  pneumonia was treated with antibiotic therapy by Dr. Woody Seller in [place]. On 08/05/2013 he was seen in the office as an add-on at which time he had significant accelerated hypertension with a blood pressure of 220/100. He was seen by [name] and admitted to [facility] hospital. He  was treated with IV Cardene and he converted to sinus rhythm. Ultimately nicardipine was discontinued and amlodipine was started as well as hydralazine. He was discharged home  on 08/09/2013. Since going home, he has taken his blood pressures religiously. These have been fairly well controlled. His heart rhythm has been fairly stable. He has noticed some mild ankle swelling right greater than left. Lower extremityDoppler studies were negative for DVT. On 08/23/2013  amlodipine was reduced from 10 to7.5 mg and I reduced his amiodarone from 400 mg to 300 mg. Subsequently, he now is on a further reduce dose of amlodipine at just 2.5 mg daily. He does note continued increasing lower extremity edema, right leg greater than left. He has not been taking his furosemide  20 mg regularly.  On 10/31/2013 I discontinued his amlodipine and recommended furosemide 20 mg daily due to his significant  pretibial and ankle edema. We also discussed sodium restriction. Since that time he has been using 20 -30 mm  support stockings below the knee with marked benefit.  The past year, he has had significant blood pressure lability. He presented to [facility] emergency room and his blood pressure was 240/110. Ultimately his blood pressure stabilized.  Her last several months, his medications have been titrated and currently has been taking amiodarone 200 mg daily without recurrent AF, Cardura 2 mg at bedtime, furosemide 20 mg daily, hydralazine 25 mg in the morning, 75 mg at noon, and 50 mg at night.  In addition to valsartan 320 mg daily.  He brought with him a blood pressure summary report, which he had done from June 17 through  05/20/2014.  This was extensively reviewed with him.  His blood pressure ranges have been as high as 180 and as low as 103 with an average blood pressure morning and 147/70 in the afternoon at 130/63 and 9 at 142/68.  He presents to the office today for followup evaluation. He is unaware of any recurrent atrial fibrillation. He denies any presyncope or syncope. He denies shortness of breath. He denies palpitations.  Allergies  Allergen Reactions  . Multaq [Dronedarone]  Shortness Of Breath    Current Outpatient Prescriptions  Medication Sig Dispense Refill  . amiodarone (PACERONE) 200 MG tablet Take 1 tablet (200 mg total) by mouth daily.  30 tablet  8  . apixaban (ELIQUIS) 2.5 MG TABS tablet Take 1 tablet (2.5 mg total) by mouth 2 (two) times daily.  60 tablet  11  . cloNIDine (CATAPRES) 0.1 MG tablet Take 1 tablet as needed for BP >190/100.  No more than 1 tablet daily or 3 tabs/week  30 tablet  0  . COMBIGAN 0.2-0.5 % ophthalmic solution Place 1 drop into both eyes 2 (two) times daily.      . finasteride (PROSCAR) 5 MG tablet Take 5 mg by mouth daily.      . furosemide (LASIX) 20 MG tablet Take 1 tablet (20 mg total) by mouth daily.  30 tablet  6  . levothyroxine (SYNTHROID, LEVOTHROID) 50 MCG tablet Take 50 mcg by mouth daily.      Marland Kitchen LORazepam (ATIVAN) 1 MG tablet Take 0.5 mg by mouth at bedtime.       . Multiple Vitamins-Minerals (MULTIVITAL PO) Take 1 tablet by mouth daily.       Marland Kitchen omeprazole (PRILOSEC) 20 MG capsule TAKE 1 CAPSULE BY MOUTH 30 MINUTES BEFORE BREAKFAST  90 capsule  3  . potassium chloride (K-DUR,KLOR-CON) 10 MEQ tablet Take 1 tablet by mouth daily.      . valsartan (DIOVAN) 320 MG tablet Take 1 tablet (320 mg total) by mouth daily.  30 tablet  8  . doxazosin (CARDURA) 4 MG tablet Take 1 tablet (4 mg total) by mouth daily.  30 tablet  5  . hydrALAZINE (APRESOLINE) 50 MG tablet Take 1 tablet (50 mg total) by mouth 3 (three) times daily.  270 tablet  3   No current facility-administered medications for this visit.    Socially he is married and has 3 children he quit tobacco in 1978. 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 ROS General: Negative; No fevers, chills, or night sweats;  HEENT: Positive for history of glaucoma No changes in vision or hearing, sinus congestion, difficulty swallowing Pulmonary: Negative; No cough, wheezing, shortness of breath, hemoptysis Cardiovascular: Negative;  No chest pain, presyncope, syncope, palpitations Positive for intermittent leg swelling GI: Negative; No nausea, vomiting, diarrhea, or abdominal pain GU: Negative; No dysuria, hematuria, or difficulty voiding Musculoskeletal: Negative; no myalgias, joint pain, or weakness Hematologic/Oncology: Negative; no easy bruising, bleeding Endocrine: Negative; no heat/cold intolerance; no diabetes Neuro: Negative; no changes in balance, headaches Skin: Negative; No rashes or skin lesions Psychiatric: Negative; No behavioral problems, depression Sleep: Negative; No snoring, daytime sleepiness, hypersomnolence, bruxism, restless legs, hypnogognic hallucinations, no cataplexy Other comprehensive 14 point system review is negative.    PE BP 150/72  Pulse 55  Ht 5\' 8"  (1.727 m)  Wt 162 lb 8 oz (73.71 kg)  BMI 24.71 kg/m2  General: Alert, oriented, no distress.  Skin: normal turgor, no  rashes HEENT: Normocephalic, atraumatic. Pupils round and reactive; sclera anicteric;no lid lag.  Nose without nasal septal hypertrophy Mouth/Parynx benign; Mallinpatti scale 3 Neck: No JVD, no carotid bruits with normal carotid upstroke. Chest wall: No tenderness to palpate Lungs: clear to ausculatation and percussion; no wheezing or rales Heart:  RRR with a ventricular rate approximately  in mid 50's; no s3. 1/6 sem; no diastolic murmur. No rubs thrills or heaves. Abdomen: soft, nontender; no hepatosplenomehaly, BS+; abdominal aorta nontender and not dilated by palpation. No definitive renal artery bruit Back: No CVA tender Pulses 2+ Extremities: Trace edema above the sock line bilaterally,  ;no clubbing cyanosis, Homan's sign negative  Neurologic: grossly nonfocal; cranial nerves grossly normal Psychologic: Normal affect and mood  ECG (independently read by me): Sinus bradycardia with first degree AV block.  PR interval 240 ms.  Nonspecific ST changes.  PAC.  Prior 01/10/2014 ECG (independently read by me)  sinus bradycardia 53 beats per minute. QTc interval 457 ms. No ectopy.  Prior 12/12/2013 ECG  (independently read by me): Sinus rhythm at 56 beats per minute. PR interval 206 ms. QTc interval 470 ms   Prior ECG of 10/31/2013: Normal sinus rhythm at 61 beats per minute. Isolated  PVC.  Non-specific intra-ventricular conduction delay. Nonspecific ST-T changes. QTc interval 461 ms.  LABS:  BMET    Component Value Date/Time   NA 133* 08/06/2013 0510   K 3.6 08/06/2013 0510   CL 99 08/06/2013 0510   CO2 23 08/06/2013 0510   GLUCOSE 97 08/06/2013 0510   BUN 15 08/06/2013 0510   CREATININE 0.96 08/06/2013 0510   CREATININE 1.16 07/21/2013 1126   CALCIUM 8.3* 08/06/2013 0510   GFRNONAA 74* 08/06/2013 0510   GFRAA 86* 08/06/2013 0510     Hepatic Function Panel     Component Value Date/Time   PROT 6.0 08/05/2013 2101     CBC    Component Value Date/Time   WBC 6.6 08/06/2013 0510   RBC 4.10* 08/06/2013 0510   HGB 13.3 08/06/2013 0510   HCT 36.9* 08/06/2013 0510   PLT 229 08/06/2013 0510   MCV 90.0 08/06/2013 0510   MCH 32.4 08/06/2013 0510   MCHC 36.0 08/06/2013 0510   RDW 12.9 08/06/2013 0510   LYMPHSABS 1.3 08/05/2013 2101   MONOABS 0.5 08/05/2013 2101   EOSABS 0.1 08/05/2013 2101   BASOSABS 0.0 08/05/2013 2101     BNP No results found for this basename: probnp    Lipid Panel     Component Value Date/Time   CHOL 162 04/06/2013 0850   TRIG 84 04/06/2013 0850   HDL 53 04/06/2013 0850   CHOLHDL 3.1 04/06/2013 0850   VLDL 17 04/06/2013 0850   LDLCALC 92 04/06/2013 0850     RADIOLOGY: No results found.    ASSESSMENT AND PLAN: Dylan York is maintaining normal sinus rhythm on his current dose of amiodarone which he is taking 200 mg daily and is tolerating eliquis at 2.5 mg twice a day for anticoagulation.  Since I last saw him, his medications have been further adjusted and titrated and he has seen [employer].D. for subsequent evaluation.  He is now on valsartan 320 mg daily in place of  Lotensin.  I recommended he change his hydralazine and take this at 50 mg 3 times a day rather than his current regimen.  I am also recommending he increase his Cardura to 4 mg at bedtime.  He will followup with [name] in 6 weeks.  Followup blood  work will be obtained consisting of a CBC, complete metabolic panel, TSH, and lipid panel.  I will see him in 4 months for cardiology reevaluation.  Troy Sine, MD, [facility]  05/24/2014 8:48 PM

## 2014-06-19 ENCOUNTER — Encounter: Payer: Self-pay | Admitting: Pharmacist Clinician (PhC)/ Clinical Pharmacy Specialist

## 2014-06-19 ENCOUNTER — Ambulatory Visit (INDEPENDENT_AMBULATORY_CARE_PROVIDER_SITE_OTHER): Payer: Medicare Other | Admitting: Pharmacist Clinician (PhC)/ Clinical Pharmacy Specialist

## 2014-06-19 VITALS — BP 128/60 | HR 60 | Ht 68.0 in | Wt 164.4 lb

## 2014-06-19 DIAGNOSIS — I10 Essential (primary) hypertension: Secondary | ICD-10-CM

## 2014-06-19 DIAGNOSIS — R0989 Other specified symptoms and signs involving the circulatory and respiratory systems: Secondary | ICD-10-CM

## 2014-06-19 NOTE — Assessment & Plan Note (Signed)
Today Dylan York blood pressure is controlled.  However he is having more mid-day lows and not liking this.  On the other hand, he has only had 1 reading >200/90 in the past month, and has not had to use any prn clonidine.  I am going to reduce his morning hydralazine back to 25mg  and have asked him to call me in a month to let me know if the readings improve.  We had a long discussion about labile blood pressures and I explained that we cannot treat every high reading.  He understands that he will most likely always have swings in his BP, and our goal is to make sure the lows are not impairing his life and the highs are short lived.   He knows how to use the prn clonidine and will keep [place] informed should he need to use it.

## 2014-06-19 NOTE — Patient Instructions (Signed)
Call in 1 month to let me know how BP is doing   Your blood pressure today is 128/60  Check your blood pressure at home daily (if able) and keep record of the readings.  Take your BP meds as follows: continue current meds, but decrease morning hydralazine to 25mg   Bring all of your meds, your BP cuff and your record of home blood pressures to your next appointment.  Exercise as you're able, try to walk approximately 30 minutes per day.  Keep salt intake to a minimum, especially watch canned and prepared boxed foods.  Eat more fresh fruits and vegetables and fewer canned items.  Avoid eating in fast food restaurants.    HOW TO TAKE YOUR BLOOD PRESSURE:   Rest 5 minutes before taking your blood pressure.    Don't smoke or drink caffeinated beverages for at least 30 minutes before.   Take your blood pressure before (not after) you eat.   Sit comfortably with your back supported and both feet on the floor (don't cross your legs).   Elevate your arm to heart level on a table or a desk.   Use the proper sized cuff. It should fit smoothly and snugly around your bare upper arm. There should be enough room to slip a fingertip under the cuff. The bottom edge of the cuff should be 1 inch above the crease of the elbow.   Ideally, take 3 measurements at one sitting and record the average.

## 2014-06-19 NOTE — Progress Notes (Signed)
06/19/2014 Dylan York [birth year] [id number]   HPI:  Dylan York is a 80 y.o. male patient of Dr Claiborne Billings with a PMH below who presents today for a blood pressure check.  Dylan York has been meticulously recording his blood pressure 3 times a day for quite some time.  His blood pressure has been erratic for some time, with lows of 80/40 and highs of 200/100, all in the same day.   He worries when the reading are greater than 150/90, which may be contributing to some of the higher readings.  For a long time we saw readings low in the early afternoons, then spiking later in the evening.  Recently we seem to have adjusted his medications to where he gets averages in the 140s/70s both morning and night, with mid-day averaging 117/55.   We have adjusted his medications multiple times and when I last saw him, the mid-day readings had jumped to an average of 130/63.  This had eliminated almost all readings <53 systolic, and only 1-2 each week <100.   When he saw Dr. Claiborne Billings in July, his hydralzine was adjusted from 25mg  am/75mg  noon/50mg  pm to 50mg  tid.  Since then his mid-day BP readings have dropped and he gets multiple readings each week <61 systolic.  This leaves him feeling very weak, and he often spends the afternoon in bed.  He states compliance with all medications.  Renal dopplers done in February show only mild R renal artery stenosis.  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  Of note he recently was started on levothyroxine 50 mcg daily.     Current Outpatient Prescriptions  Medication Sig Dispense Refill  . amiodarone (PACERONE) 200 MG tablet Take 1 tablet (200 mg total) by mouth daily.  30 tablet  8  . apixaban (ELIQUIS) 2.5 MG TABS tablet Take 1 tablet (2.5 mg total) by mouth 2 (two) times daily.  60 tablet  11  . cloNIDine (CATAPRES) 0.1 MG tablet Take 1 tablet as needed for BP >190/100.  No more than 1 tablet daily or 3 tabs/week  30 tablet  0  . COMBIGAN  0.2-0.5 % ophthalmic solution Place 1 drop into both eyes 2 (two) times daily.      Marland Kitchen doxazosin (CARDURA) 4 MG tablet Take 1 tablet (4 mg total) by mouth daily.  30 tablet  5  . finasteride (PROSCAR) 5 MG tablet Take 5 mg by mouth daily.      . furosemide (LASIX) 20 MG tablet Take 1 tablet (20 mg total) by mouth daily.  30 tablet  6  . hydrALAZINE (APRESOLINE) 50 MG tablet Take 1 tablet (50 mg total) by mouth 3 (three) times daily.  270 tablet  3  . levothyroxine (SYNTHROID, LEVOTHROID) 50 MCG tablet Take 50 mcg by mouth daily.      Marland Kitchen LORazepam (ATIVAN) 1 MG tablet Take 0.5 mg by mouth at bedtime.       . Multiple Vitamins-Minerals (MULTIVITAL PO) Take 1 tablet by mouth daily.       Marland Kitchen omeprazole (PRILOSEC) 20 MG capsule TAKE 1 CAPSULE BY MOUTH 30 MINUTES BEFORE BREAKFAST  90 capsule  3  . potassium chloride (K-DUR,KLOR-CON) 10 MEQ tablet Take 1 tablet by mouth daily.      . valsartan (DIOVAN) 320 MG tablet Take 1 tablet (320 mg total) by mouth daily.  30 tablet  8   No current facility-administered medications for this visit.  Allergies  Allergen Reactions  . Multaq [Dronedarone] Shortness Of Breath    Past Medical History  Diagnosis Date  . Hypertension   . Tricuspid regurgitation   . GERD (gastroesophageal reflux disease)   . Atrial tachycardia   . Pneumonia   . BPH (benign prostatic hyperplasia)   . Sinus bradycardia, with HR in the 40s BB stopped. 08/08/2013  . Thyroid disease     Blood pressure 128/60, pulse 60, height 5\' 8"  (1.727 m), weight 164 lb 6.4 oz (74.571 kg). Standing 118/64   ASSESSMENT AND PLAN:   Dylan York PharmD CPP [facility]

## 2014-07-18 ENCOUNTER — Telehealth: Payer: Self-pay | Admitting: Pharmacist Clinician (PhC)/ Clinical Pharmacy Specialist

## 2014-07-18 NOTE — Telephone Encounter (Signed)
Pt called, LMOM concerned because BP rose to 219/94 Sunday.  Pt took clonidine 0.1mg  tablet, pressured decreased to 281 then 188 systolic over the next 6-77 hours.  However by noon Monday it was back up to 212/86.    Returned call, no answer, LMOM for patient to continue with clonidine for elevated pressures, I will try to reach him again tomorrow.

## 2014-07-20 NOTE — Telephone Encounter (Signed)
Spoke with patient at length.  He had recently discontinued mid day hydralazine dose (50mg ) and for the first 7-10 days his evening BP averaged 144/60.  He had a few low readings that caused him to be lightheaded/dizzy, prompting him to stop the dose.  He states that today his pressures seem to be back in the normal ranges, only Sunday and Monday did they increase to greater than 575 systolic.  We reviewed what he had done to see if there was a cause for the BP spike, but could not determine anything of significance.  Advised that he continue on the current routine, but if he notices a trend toward increased readings in the evenings, he may need to restart the hydralazine at 25mg  and possbily back to 50mg .  Pt to call in a few weeks to let me know how things are going.

## 2014-08-11 ENCOUNTER — Other Ambulatory Visit: Payer: Self-pay | Admitting: Internal Medicine

## 2014-08-11 DIAGNOSIS — R911 Solitary pulmonary nodule: Secondary | ICD-10-CM

## 2014-08-17 ENCOUNTER — Other Ambulatory Visit: Payer: Medicare Other

## 2014-08-21 ENCOUNTER — Ambulatory Visit
Admission: RE | Admit: 2014-08-21 | Discharge: 2014-08-21 | Disposition: A | Discharge status: Home / Self Care | Payer: Medicare Other | Source: Ambulatory Visit | Attending: Internal Medicine | Admitting: Internal Medicine

## 2014-08-21 DIAGNOSIS — R911 Solitary pulmonary nodule: Secondary | ICD-10-CM

## 2014-09-25 ENCOUNTER — Encounter: Payer: Self-pay | Admitting: Cardiovascular Disease

## 2014-09-25 ENCOUNTER — Ambulatory Visit (INDEPENDENT_AMBULATORY_CARE_PROVIDER_SITE_OTHER): Payer: Medicare Other | Admitting: Cardiovascular Disease

## 2014-09-25 VITALS — BP 130/70 | HR 52 | Ht 68.0 in | Wt 164.2 lb

## 2014-09-25 DIAGNOSIS — I119 Hypertensive heart disease without heart failure: Secondary | ICD-10-CM

## 2014-09-25 DIAGNOSIS — I480 Paroxysmal atrial fibrillation: Secondary | ICD-10-CM

## 2014-09-25 DIAGNOSIS — K219 Gastro-esophageal reflux disease without esophagitis: Secondary | ICD-10-CM

## 2014-09-25 DIAGNOSIS — I10 Essential (primary) hypertension: Secondary | ICD-10-CM

## 2014-09-25 DIAGNOSIS — R0989 Other specified symptoms and signs involving the circulatory and respiratory systems: Secondary | ICD-10-CM

## 2014-09-25 DIAGNOSIS — Z7901 Long term (current) use of anticoagulants: Secondary | ICD-10-CM

## 2014-09-25 DIAGNOSIS — E039 Hypothyroidism, unspecified: Secondary | ICD-10-CM

## 2014-09-25 NOTE — Patient Instructions (Signed)
Your physician has recommended you make the following change in your medication: take the hydralazine every 8 hours. Take the Valsartan in the morning.  Your physician recommends that you schedule a follow-up appointment in: 3 months or sooner if needed with Dr. Claiborne Billings.

## 2014-09-25 NOTE — Progress Notes (Signed)
Patient ID: Dylan York, male   DOB: 1926-03-04, 80 y.o.   MRN: [id number]        HPI: Dylan York is a 80 y.o. male who presents to the office for 4 month  followup cardiology evaluation of his blood pressure lability.  Dylan York has a history of sick sinus syndrome with atrial tachycardia as well as sinus bradycardia, ventricular bigeminy, and frequent PVCs.  He had  stabilized with Bystolic but when he was seen on 03/08/2013 he was in atrial fibrillation of questionable duration. At that time I increased his Bystolic and his started anticoagulation with Eliquis 2.5 mg twice a day. An echo Doppler study on 03/30/2013 revealed normal systolic function with an ejection fraction of 60-65%, aortic valve sclerosis, biatrial enlargement, as well as mild pulmonary hypertension with a PA pressure of 41 mm. On 04/04/2013 AF 77 rate was beats per minute. He was initially started  on Multaq 400 milligrams twice a day but did not tolerate this and ultimately discontinue its use.  When  l saw him on 04/26/2013 I started him on amiodarone 200 mg daily. He has tolerated this well. 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  On 05/27/2013 I. further titrated his amiodarone to 200 mg twice a day. I also recommended that he reduce his Bystolic from 10 to 5 mg with a plan to do a cardioversion but as part of his preoperative assessment he was found to have pneumonia. His  pneumonia was treated with antibiotic therapy by Dr. Woody Seller in [place]. On 08/05/2013 he was seen in the office as an add-on at which time he had significant accelerated hypertension with a blood pressure of 220/100. He was seen by [name] and admitted to [facility] hospital. He  was treated with IV Cardene and he converted to sinus rhythm. Ultimately nicardipine was discontinued and amlodipine was started as well as hydralazine. He was discharged home  on 08/09/2013. Since going home, he has taken his blood pressures religiously. These have been fairly well controlled. His heart rhythm has been fairly stable. He has noticed some mild ankle swelling right greater than left. Lower extremityDoppler studies were negative for DVT. On 08/23/2013  amlodipine was reduced from 10 to7.5 mg and I reduced his amiodarone from 400 mg to 300 mg. Subsequently, he now is on a further reduce dose of amlodipine at just 2.5 mg daily. He does note continued increasing lower extremity edema, right leg greater than left. He has not been taking his furosemide  20 mg regularly.  On 10/31/2013 I discontinued his amlodipine and recommended furosemide 20 mg daily due to his significant  pretibial and ankle edema. We also discussed sodium restriction. Since that time he has been using 20 -30 mm  support stockings below the knee with marked benefit.  Over the past year, he has had significant blood pressure lability. He presented to [facility] emergency room and his blood pressure was 240/110. Ultimately his blood pressure stabilized.    his medications have been adjusted over the past year at present he has been taking Cardura 4 mg at bedtime, Lasix 20 mg daily, hydralazine 25 mg in the morning and 50 mg in the evening, and he has been taking his valsartan 320 mg typically at 4 PM. He also has a prescription for clonidine 0.1 mg to take on an as-needed basis.  He is  unaware of any breakthrough atrial fibrillation and has been tolerating amiodarone 200 mg daily.  He is on Eliquis 2.5 mg twice a day for anticoagulation.  He also has history of hypothyroidism on Synthroid replacement 50 g and GERD for which he takes omeprazole.   He brought with him today again his average blood pressures and he states in the morning.  This typically has been 135/65, at noon, 125/61, and in the evening 149/69.  However, he has noticed significant variability despite these mean  pressures.  Allergies  Allergen Reactions  . Multaq [Dronedarone] Shortness Of Breath    Current Outpatient Prescriptions  Medication Sig Dispense Refill  . amiodarone (PACERONE) 200 MG tablet Take 1 tablet (200 mg total) by mouth daily. 30 tablet 8  . apixaban (ELIQUIS) 2.5 MG TABS tablet Take 1 tablet (2.5 mg total) by mouth 2 (two) times daily. 60 tablet 11  . cloNIDine (CATAPRES) 0.1 MG tablet Take 1 tablet as needed for BP >190/100.  No more than 1 tablet daily or 3 tabs/week 30 tablet 0  . COMBIGAN 0.2-0.5 % ophthalmic solution Place 1 drop into both eyes 2 (two) times daily.    Marland Kitchen doxazosin (CARDURA) 4 MG tablet Take 1 tablet (4 mg total) by mouth daily. 30 tablet 5  . finasteride (PROSCAR) 5 MG tablet Take 5 mg by mouth daily.    . furosemide (LASIX) 20 MG tablet Take 1 tablet (20 mg total) by mouth daily. 30 tablet 6  . hydrALAZINE (APRESOLINE) 50 MG tablet Take by mouth. 25 mg in the morning and 50 mg in the evening    . levothyroxine (SYNTHROID, LEVOTHROID) 50 MCG tablet Take 50 mcg by mouth daily.    Marland Kitchen LORazepam (ATIVAN) 1 MG tablet Take 0.5 mg by mouth at bedtime.     . Multiple Vitamins-Minerals (MULTIVITAL PO) Take 1 tablet by mouth daily.     Marland Kitchen omeprazole (PRILOSEC) 20 MG capsule TAKE 1 CAPSULE BY MOUTH 30 MINUTES BEFORE BREAKFAST 90 capsule 3  . potassium chloride (K-DUR,KLOR-CON) 10 MEQ tablet Take 1 tablet by mouth daily.    . valsartan (DIOVAN) 320 MG tablet Take 1 tablet (320 mg total) by mouth daily. 30 tablet 8   No current facility-administered medications for this visit.    Socially he is married and has 3 children he quit tobacco in 1978. 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 ROS General: Negative; No fevers, chills, or night sweats;  HEENT: Positive for history of glaucoma No changes in vision or hearing, sinus congestion, difficulty swallowing Pulmonary: Negative; No cough, wheezing, shortness of breath,  hemoptysis Cardiovascular: Negative; No chest pain, presyncope, syncope, palpitations Positive for intermittent leg swelling GI: Negative; No nausea, vomiting, diarrhea, or abdominal pain GU: Negative; No dysuria, hematuria, or difficulty voiding Musculoskeletal: Negative; no myalgias, joint pain, or weakness Hematologic/Oncology: Negative; no easy bruising, bleeding Endocrine: Negative; no heat/cold intolerance; no diabetes Neuro: Negative; no changes in balance, headaches Skin: Negative; No rashes or skin lesions Psychiatric: Negative; No behavioral problems, depression Sleep: Negative; No snoring, daytime sleepiness, hypersomnolence, bruxism, restless legs, hypnogognic hallucinations, no cataplexy Other comprehensive 14 point system review is negative.    PE BP 130/70 mmHg  Pulse 52  Ht 5\' 8"  (1.727 m)  Wt 164 lb 3.2 oz (74.481 kg)  BMI 24.97 kg/m2   Repeat blood pressure by me was 180/80 supine and was 180/78 standing. General: Alert, oriented, no distress.  Skin: normal  turgor, no rashes HEENT: Normocephalic, atraumatic. Pupils round and reactive; sclera anicteric;no lid lag.  Nose without nasal septal hypertrophy Mouth/Parynx benign; Mallinpatti scale 3 Neck: No JVD, no carotid bruits with normal carotid upstroke. Chest wall: No tenderness to palpate Lungs: clear to ausculatation and percussion; no wheezing or rales Heart:  RRR with a ventricular rate approximately  in mid 50's; no s3. 1/6 sem; no diastolic murmur. No rubs thrills or heaves. Abdomen: soft, nontender; no hepatosplenomehaly, BS+; abdominal aorta nontender and not dilated by palpation. No definitive renal artery bruit Back: No CVA tender Pulses 2+ Extremities: Trace edema above the sock line bilaterally,  ;no clubbing cyanosis, Homan's sign negative  Neurologic: grossly nonfocal; cranial nerves grossly normal Psychologic: Normal affect and mood   ECG (independently read by me):  Sinus bradycardia 52 bpm.  PR  interval 216 ms.  No ectopy.  July 2015ECG (independently read by me): Sinus bradycardia with first degree AV block.  PR interval 240 ms.  Nonspecific ST changes.  PAC.  Prior 01/10/2014 ECG (independently read by me) sinus bradycardia 53 beats per minute. QTc interval 457 ms. No ectopy.  Prior 12/12/2013 ECG  (independently read by me): Sinus rhythm at 56 beats per minute. PR interval 206 ms. QTc interval 470 ms   Prior ECG of 10/31/2013: Normal sinus rhythm at 61 beats per minute. Isolated  PVC.  Non-specific intra-ventricular conduction delay. Nonspecific ST-T changes. QTc interval 461 ms.  LABS:  BMET    Component Value Date/Time   NA 133* 08/06/2013 0510   K 3.6 08/06/2013 0510   CL 99 08/06/2013 0510   CO2 23 08/06/2013 0510   GLUCOSE 97 08/06/2013 0510   BUN 15 08/06/2013 0510   CREATININE 0.96 08/06/2013 0510   CREATININE 1.16 07/21/2013 1126   CALCIUM 8.3* 08/06/2013 0510   GFRNONAA 74* 08/06/2013 0510   GFRAA 86* 08/06/2013 0510     Hepatic Function Panel     Component Value Date/Time   PROT 6.0 08/05/2013 2101     CBC    Component Value Date/Time   WBC 6.6 08/06/2013 0510   RBC 4.10* 08/06/2013 0510   HGB 13.3 08/06/2013 0510   HCT 36.9* 08/06/2013 0510   PLT 229 08/06/2013 0510   MCV 90.0 08/06/2013 0510   MCH 32.4 08/06/2013 0510   MCHC 36.0 08/06/2013 0510   RDW 12.9 08/06/2013 0510   LYMPHSABS 1.3 08/05/2013 2101   MONOABS 0.5 08/05/2013 2101   EOSABS 0.1 08/05/2013 2101   BASOSABS 0.0 08/05/2013 2101     BNP No results found for: PROBNP  Lipid Panel     Component Value Date/Time   CHOL 162 04/06/2013 0850   TRIG 84 04/06/2013 0850   HDL 53 04/06/2013 0850   CHOLHDL 3.1 04/06/2013 0850   VLDL 17 04/06/2013 0850   LDLCALC 92 04/06/2013 0850     RADIOLOGY: No results found.    ASSESSMENT AND PLAN: Mr. Leaf is a young appearing 80 year old white male who is maintaining normal sinus rhythm on his current dose of amiodarone which  he is taking 200 mg daily and is tolerating eliquis at 2.5 mg twice a day for anticoagulation.   Despite his being blood pressures, he has exhibited significant daily variability.  At present , and blood pressure today is elevated at 180/80 at just about noon when he was being evaluated. He has only been taking the hydralazine 25 mg in the morning and 50,000,000 g at night.  I have suggested that he  change this to 25 mg every 8 hours and also suggested that he change his Diovan intake is 320 mg in the morning rather than in the late afternoon.  He will continue to monitor his blood pressures closely. He does note mild shortness of breath with activity. Is not having any recurrent atrial fibrillation.  He is tolerating anticoagulation with Eliquis without bleeding.  He will continue to keep records.  I will see him in 3 months for reevaluation.   Time spent: 25 minutes Dylan Sine, MD, [facility]  09/25/2014 11:46 AM

## 2014-09-26 DIAGNOSIS — E039 Hypothyroidism, unspecified: Secondary | ICD-10-CM | POA: Insufficient documentation

## 2014-10-02 ENCOUNTER — Encounter: Payer: Self-pay | Admitting: Cardiovascular Disease

## 2014-10-05 ENCOUNTER — Telehealth: Payer: Self-pay | Admitting: Cardiovascular Disease

## 2014-10-05 NOTE — Telephone Encounter (Signed)
Dylan York called in stating that the pt is out of refills for his Hydralazine 25mg  script and would like to have a new prescription for this. Please call  thanks

## 2014-10-05 NOTE — Telephone Encounter (Signed)
Spoke with kristin at [facility]. Refills given

## 2014-11-03 ENCOUNTER — Other Ambulatory Visit: Payer: Self-pay | Admitting: Cardiovascular Disease

## 2014-11-06 NOTE — Telephone Encounter (Signed)
Rx refill sent to patient pharmacy   

## 2014-12-26 ENCOUNTER — Ambulatory Visit (INDEPENDENT_AMBULATORY_CARE_PROVIDER_SITE_OTHER): Payer: Medicare Other | Admitting: Cardiovascular Disease

## 2014-12-26 VITALS — BP 162/90 | HR 48 | Ht 68.0 in | Wt 163.2 lb

## 2014-12-26 DIAGNOSIS — R0989 Other specified symptoms and signs involving the circulatory and respiratory systems: Secondary | ICD-10-CM

## 2014-12-26 DIAGNOSIS — I701 Atherosclerosis of renal artery: Secondary | ICD-10-CM

## 2014-12-26 DIAGNOSIS — Z7901 Long term (current) use of anticoagulants: Secondary | ICD-10-CM

## 2014-12-26 DIAGNOSIS — N281 Cyst of kidney, acquired: Secondary | ICD-10-CM

## 2014-12-26 DIAGNOSIS — Q6102 Congenital multiple renal cysts: Secondary | ICD-10-CM

## 2014-12-26 DIAGNOSIS — I495 Sick sinus syndrome: Secondary | ICD-10-CM

## 2014-12-26 DIAGNOSIS — I10 Essential (primary) hypertension: Secondary | ICD-10-CM

## 2014-12-26 DIAGNOSIS — I480 Paroxysmal atrial fibrillation: Secondary | ICD-10-CM

## 2014-12-26 MED ORDER — AMIODARONE HCL 200 MG PO TABS: ORAL_TABLET | ORAL | Status: DC

## 2014-12-26 MED ORDER — HYDRALAZINE HCL 50 MG PO TABS: 50.0000 mg | ORAL_TABLET | Freq: Two times a day (BID) | ORAL | Status: DC

## 2014-12-26 NOTE — Patient Instructions (Signed)
Your physician recommends that you schedule a follow-up appointment in: 3 Months  Your physician has requested that you have a renal artery duplex. During this test, an ultrasound is used to evaluate blood flow to the kidneys. Allow one hour for this exam. Do not eat after midnight the day before and avoid carbonated beverages. Take your medications as you usually do.  Your physician has recommended you make the following change in your medication: Take Hydralazine 50 mg twice a day, Take Amiodarone 100 mg alternating with 200 mg daily

## 2014-12-28 ENCOUNTER — Encounter: Payer: Self-pay | Admitting: Cardiovascular Disease

## 2014-12-28 NOTE — Progress Notes (Signed)
Patient ID: Dylan York, male   DOB: 20-Apr-1926, 81 y.o.   MRN: [id number]      HPI: Dylan York is a 81 y.o. male who presents to the office for 4 month  followup cardiology evaluation.  Dylan York has a history of sick sinus syndrome with atrial tachycardia as well as sinus bradycardia, ventricular bigeminy, and frequent PVCs.  Dylan York had  stabilized with Bystolic but when Dylan York was seen on 03/08/2013 Dylan York was in atrial fibrillation of questionable duration. At that time I increased his Bystolic and his started anticoagulation with Eliquis 2.5 mg twice a day. An echo Doppler study on 03/30/2013 revealed normal systolic function with an ejection fraction of 60-65%, aortic valve sclerosis, biatrial enlargement, as well as mild pulmonary hypertension with a PA pressure of 41 mm. On 04/04/2013 AF 77 rate was beats per minute. Dylan York was initially started  on Multaq 400 milligrams twice a day but did not tolerate this and ultimately discontinue its use.  When  l saw him on 04/26/2013 I started him on amiodarone 200 mg daily. Dylan York has tolerated this well. 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  On 05/27/2013 I. further titrated his amiodarone to 200 mg twice a day. I also recommended that Dylan York reduce his Bystolic from 10 to 5 mg with a plan to do a cardioversion but as part of his preoperative assessment Dylan York was found to have pneumonia. His  pneumonia was treated with antibiotic therapy by Dr. [name] in [place]. On 08/05/2013 Dylan York was seen in the office as an add-on at which time Dylan York had significant accelerated hypertension with a blood pressure of 220/100. Dylan York was seen by Dylan York and admitted to [facility] hospital. Dylan York  was treated with IV Cardene and Dylan York converted to sinus rhythm. Ultimately nicardipine was discontinued and amlodipine was started as well as hydralazine. Dylan York was discharged home on 08/09/2013. Since going home,  Dylan York has taken his blood pressures religiously. These have been fairly well controlled. His heart rhythm has been fairly stable. Dylan York has noticed some mild ankle swelling right greater than left. Lower extremityDoppler studies were negative for DVT. On 08/23/2013  amlodipine was reduced from 10 to7.5 mg and I reduced his amiodarone from 400 mg to 300 mg. Subsequently, Dylan York now is on a further reduce dose of amlodipine at just 2.5 mg daily. Dylan York does note continued increasing lower extremity edema, right leg greater than left. Dylan York has not been taking his furosemide  20 mg regularly.  On 10/31/2013 I discontinued his amlodipine and recommended furosemide 20 mg daily due to his significant  pretibial and ankle edema. We also discussed sodium restriction. Since that time Dylan York has been using 20 -30 mm  support stockings below the knee with marked benefit.  Over the past year, Dylan York has had significant blood pressure lability. Dylan York presented to [facility] emergency room and his blood pressure was 240/110. Ultimately his blood pressure stabilized.   Over the past year, Dylan York has noticed significant variability of his LAD pressures.  Since I last saw him, Dylan York has been on clonidine which Dylan York takes as needed if his blood pressure gets above 190/100.  Dylan York is on Cardura 4 mg at bedtime, furosemide 20 g daily, hydralazine 25 mg 3 times a day, and valsartan 320 mg daily.  Dylan York also has been on levothyroxine 50 g for hypothyroidism, finasteride 5 mg  for prostate issues.  Dylan York is been unaware of breakthrough atrial fibrillation and has been on amiodarone 200 mg daily and continued anticoagulation with Eliquis 2.5 mg twice a day.  Dylan York brought with him again, detailed BP assessments from the morning, noon to 6 PM and 6 PM to midnight.  Particularly at night Dylan York seems to have labile episodes where his blood pressure may go up to 500 systolically.  Dylan York also has had some blood pressure readings as low as 95.  Dylan York presents for evaluation.  Review of  his records indicates that his last renal duplex scan was one year ago in February 2015 which demonstrated right renal artery narrowing proximally in the range of 1-59% with velocities at 213, suggesting the upper end of the range.  His left renal artery had normal pain. Dylan York  Dylan York has numerous large anechoic cysts in both kidneys with the largest measuring 8.9 x 7.6 x 8.0.  Dylan York is also followed by [name] urologically.  Allergies  Allergen Reactions  . Multaq [Dronedarone] Shortness Of Breath    Current Outpatient Prescriptions  Medication Sig Dispense Refill  . amiodarone (PACERONE) 200 MG tablet Take 100 mg alternating with 200 mg the next day. 135 tablet 3  . apixaban (ELIQUIS) 2.5 MG TABS tablet Take 1 tablet (2.5 mg total) by mouth 2 (two) times daily. 60 tablet 11  . cloNIDine (CATAPRES) 0.1 MG tablet Take 1 tablet as needed for BP >190/100.  No more than 1 tablet daily or 3 tabs/week 30 tablet 0  . COMBIGAN 0.2-0.5 % ophthalmic solution Place 1 drop into both eyes 2 (two) times daily.    Marland Kitchen doxazosin (CARDURA) 4 MG tablet Take 1 tablet (4 mg total) by mouth daily. 30 tablet 5  . finasteride (PROSCAR) 5 MG tablet Take 5 mg by mouth daily.    . furosemide (LASIX) 20 MG tablet TAKE 1 TABLET BY MOUTH DAILY 30 tablet 12  . levothyroxine (SYNTHROID, LEVOTHROID) 50 MCG tablet Take 50 mcg by mouth daily.    Marland Kitchen LORazepam (ATIVAN) 1 MG tablet Take 0.5 mg by mouth at bedtime.     . Multiple Vitamins-Minerals (MULTIVITAL PO) Take 1 tablet by mouth daily.     Marland Kitchen omeprazole (PRILOSEC) 20 MG capsule TAKE 1 CAPSULE BY MOUTH 30 MINUTES BEFORE BREAKFAST 90 capsule 3  . potassium chloride (K-DUR,KLOR-CON) 10 MEQ tablet Take 1 tablet by mouth daily.    . valsartan (DIOVAN) 320 MG tablet Take 1 tablet (320 mg total) by mouth daily. (Patient taking differently: Take 320 mg by mouth every morning. ) 30 tablet 8  . hydrALAZINE (APRESOLINE) 50 MG tablet Take 1 tablet (50 mg total) by mouth 2 (two) times daily.  180 tablet 3   No current facility-administered medications for this visit.    Socially Dylan York is married and has 3 children Dylan York quit tobacco in 1978. 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 Dylan York tries to play golf once a week.  ROS General: Negative; No fevers, chills, or night sweats;  HEENT: Positive for history of glaucoma No changes in vision or hearing, sinus congestion, difficulty swallowing Pulmonary: Negative; No cough, wheezing, shortness of breath, hemoptysis Cardiovascular: Negative; No chest pain, presyncope, syncope, palpitations Positive for intermittent leg swelling GI: Negative; No nausea, vomiting, diarrhea, or abdominal pain GU: Negative; No dysuria, hematuria, or difficulty voiding Musculoskeletal: Negative; no myalgias, joint pain, or weakness Hematologic/Oncology: Negative; no easy bruising, bleeding Endocrine: Negative; no heat/cold intolerance; no diabetes Neuro: Negative;  no changes in balance, headaches Skin: Negative; No rashes or skin lesions Psychiatric: Negative; No behavioral problems, depression Sleep: Negative; No snoring, daytime sleepiness, hypersomnolence, bruxism, restless legs, hypnogognic hallucinations, no cataplexy Other comprehensive 14 point system review is negative.    PE BP 162/90 mmHg  Pulse 48  Ht $R'5\' 8"'PY$  (1.727 m)  Wt 163 lb 3.2 oz (74.027 kg)  BMI 24.82 kg/m2   Repeat blood pressure by me was 180/80 supine and was 180/78 standing. General: Alert, oriented, no distress.  Skin: normal turgor, no rashes HEENT: Normocephalic, atraumatic. Pupils round and reactive; sclera anicteric;no lid lag.  Nose without nasal septal hypertrophy Mouth/Parynx benign; Mallinpatti scale 3 Neck: No JVD, no carotid bruits with normal carotid upstroke. Chest wall: No tenderness to palpate Lungs: clear to ausculatation and percussion; no wheezing or rales Heart:  Regular rhythm but bradycardic with a ventricular rate in the upper 40s to mid 50s; no  s3. 1/6 sem; no diastolic murmur. No rubs thrills or heaves. Abdomen: soft, nontender; no hepatosplenomehaly, BS+; abdominal aorta nontender and not dilated by palpation. No definitive renal artery bruit Back: No CVA tenderness Pulses 2+ Extremities: Trace edema above the sock line bilaterally,  ;no clubbing cyanosis, Homan's sign negative  Neurologic: grossly nonfocal; cranial nerves grossly normal Psychologic: Normal affect and mood  ECG (independently read by me, and (: Sinus bradycardia 48 bpm.  Nonspecific conduction delay.  No significant ST abnormalities.  QTc interval 434 ms.  November 2015 ECG (independently read by me):  Sinus bradycardia 52 bpm.  PR interval 216 ms.  No ectopy.  July 2015ECG (independently read by me): Sinus bradycardia with first degree AV block.  PR interval 240 ms.  Nonspecific ST changes.  PAC.  Prior 01/10/2014 ECG (independently read by me) sinus bradycardia 53 beats per minute. QTc interval 457 ms. No ectopy.  Prior 12/12/2013 ECG  (independently read by me): Sinus rhythm at 56 beats per minute. PR interval 206 ms. QTc interval 470 ms   Prior ECG of 10/31/2013: Normal sinus rhythm at 61 beats per minute. Isolated  PVC.  Non-specific intra-ventricular conduction delay. Nonspecific ST-T changes. QTc interval 461 ms.  LABS:  BMET  BMP Latest Ref Rng 08/06/2013 08/05/2013 07/21/2013  Glucose 70 - 99 mg/dL 97 132(H) 103(H)  BUN 6 - 23 mg/dL $Remove'15 16 14  'TQTFqDh$ Creatinine 0.50 - 1.35 mg/dL 0.96 1.04 1.16  Sodium 135 - 145 mEq/L 133(L) 135 135  Potassium 3.5 - 5.1 mEq/L 3.6 3.2(L) 3.7  Chloride 96 - 112 mEq/L 99 99 97  CO2 19 - 32 mEq/L $Remove'23 22 28  'sMLpnEX$ Calcium 8.4 - 10.5 mg/dL 8.3(L) 8.4 9.0     Hepatic Function Panel   Hepatic Function Latest Ref Rng 08/05/2013 07/21/2013  Total Protein 6.0 - 8.3 g/dL 6.0 6.3  Albumin 3.5 - 5.2 g/dL 2.9(L) 3.9  AST 0 - 37 U/L 28 24  ALT 0 - 53 U/L 33 21  Alk Phosphatase 39 - 117 U/L 74 66  Total Bilirubin 0.3 - 1.2 mg/dL 1.0 1.9(H)      CBC  CBC Latest Ref Rng 08/06/2013 08/05/2013 07/21/2013  WBC 4.0 - 10.5 K/uL 6.6 7.3 8.5  Hemoglobin 13.0 - 17.0 g/dL 13.3 13.1 14.4  Hematocrit 39.0 - 52.0 % 36.9(L) 37.3(L) 41.8  Platelets 150 - 400 K/uL 229 231 167     BNP No results found for: PROBNP  Lipid Panel     Component Value Date/Time   CHOL 162 04/06/2013 0850   TRIG 84  04/06/2013 0850   HDL 53 04/06/2013 0850   CHOLHDL 3.1 04/06/2013 0850   VLDL 17 04/06/2013 0850   LDLCALC 92 04/06/2013 0850     RADIOLOGY: No results found.    ASSESSMENT AND PLAN: Dylan York is a young appearing 81 year old white male who is maintaining normal sinus rhythm on his current dose of amiodarone which Dylan York is taking 200 mg daily and is tolerating eliquis at 2.5 mg twice a day for anticoagulation.  His ECG today reveals significant sinus bradycardia at 48 bpm.  Dylan York is not aware of any breakthrough atrial fibrillation.  I have recommended Dylan York reduce his amiodarone to 100 mg alternating with 200 mg every other day.  I spent a long time today looking at his blood pressure data which Dylan York brought with him from home.  Dylan York does have marked variability and sometimes in the evenings and late afternoon blood pressure ranges are almost 100 mm meters.  Apart on different occasions.  However, his average blood pressures are well controlled.  I have recommended Dylan York change his hydralazine from 25 mg 3 times a day to 50 mg twice a day.  I'm scheduling him for one-year follow-up renal duplex scan to further evaluate potential for progressive renal artery stenosis which may be contributing to his blood pressure control difficulty.  Dylan York also has significant renal artery cysts, which will also be reassessed incised.  Dylan York watches his diet fairly carefully and also have suggested perhaps even improved sodium restriction.  Dylan York will continue to obtain dated with reference to his blood pressure readings.  I will see him in 3 months for reevaluation or sooner if problems  arise.    Time spent: 25 minutes  Troy Sine, MD, [facility]  12/28/2014 12:40 PM

## 2014-12-29 ENCOUNTER — Other Ambulatory Visit: Payer: Self-pay | Admitting: Cardiovascular Disease

## 2014-12-29 ENCOUNTER — Encounter: Payer: Self-pay | Admitting: Cardiovascular Disease

## 2015-01-01 ENCOUNTER — Encounter: Payer: Self-pay | Admitting: *Deleted

## 2015-01-01 NOTE — Telephone Encounter (Signed)
Rx(s) sent to pharmacy electronically.  

## 2015-01-02 ENCOUNTER — Telehealth: Payer: Self-pay | Admitting: Cardiovascular Disease

## 2015-01-02 MED ORDER — APIXABAN 2.5 MG PO TABS: 2.5000 mg | ORAL_TABLET | Freq: Two times a day (BID) | ORAL | Status: DC

## 2015-01-02 NOTE — Telephone Encounter (Signed)
Pharmacy tech phoned in for refill authorization. This was completed.

## 2015-01-02 NOTE — Telephone Encounter (Signed)
°  1. Which medications need to be refilled? Eliquis   2. Which pharmacy is medication to be sent to?[facility]  3. Do they need a 30 day or 90 day supply? 30  4. Would they like a call back once the medication has been sent to the pharmacy? yes

## 2015-01-12 ENCOUNTER — Ambulatory Visit (HOSPITAL_COMMUNITY)
Admission: RE | Admit: 2015-01-12 | Discharge: 2015-01-12 | Disposition: A | Discharge status: Home / Self Care | Payer: Medicare Other | Source: Ambulatory Visit | Attending: Internal Medicine | Admitting: Internal Medicine

## 2015-01-12 DIAGNOSIS — I10 Essential (primary) hypertension: Secondary | ICD-10-CM | POA: Insufficient documentation

## 2015-01-12 DIAGNOSIS — I701 Atherosclerosis of renal artery: Secondary | ICD-10-CM | POA: Diagnosis present

## 2015-01-12 NOTE — Progress Notes (Signed)
Renal Artery Duplex Completed. °Brianna L Mazza,RVT °

## 2015-01-22 ENCOUNTER — Telehealth (HOSPITAL_COMMUNITY): Payer: Self-pay | Admitting: *Deleted

## 2015-01-23 ENCOUNTER — Telehealth: Payer: Self-pay | Admitting: Cardiovascular Disease

## 2015-01-23 NOTE — Telephone Encounter (Signed)
Mild Renal narrowing L> R 1-59%; velocities have increased on left c/w last year. Previously noted renal cysts bilaterally; see scanned report

## 2015-01-23 NOTE — Telephone Encounter (Signed)
Returned call to patient spoke to wife results not available for 01/12/15 renal dopplers.Message sent to [facility].

## 2015-01-23 NOTE — Telephone Encounter (Signed)
Returned call to patient spoke to wife renal doppler results given.

## 2015-01-23 NOTE — Telephone Encounter (Signed)
Pt would like his renal arteries ultrasound results please from 01-12-15.

## 2015-03-27 ENCOUNTER — Ambulatory Visit (INDEPENDENT_AMBULATORY_CARE_PROVIDER_SITE_OTHER): Payer: Medicare Other | Admitting: Cardiovascular Disease

## 2015-03-27 ENCOUNTER — Encounter: Payer: Self-pay | Admitting: Cardiovascular Disease

## 2015-03-27 VITALS — BP 180/86 | HR 52 | Ht 68.0 in | Wt 164.7 lb

## 2015-03-27 DIAGNOSIS — E039 Hypothyroidism, unspecified: Secondary | ICD-10-CM | POA: Diagnosis not present

## 2015-03-27 DIAGNOSIS — Q6102 Congenital multiple renal cysts: Secondary | ICD-10-CM

## 2015-03-27 DIAGNOSIS — R0989 Other specified symptoms and signs involving the circulatory and respiratory systems: Secondary | ICD-10-CM

## 2015-03-27 DIAGNOSIS — I495 Sick sinus syndrome: Secondary | ICD-10-CM | POA: Diagnosis not present

## 2015-03-27 DIAGNOSIS — Z7901 Long term (current) use of anticoagulants: Secondary | ICD-10-CM

## 2015-03-27 DIAGNOSIS — I480 Paroxysmal atrial fibrillation: Secondary | ICD-10-CM | POA: Diagnosis not present

## 2015-03-27 DIAGNOSIS — N281 Cyst of kidney, acquired: Secondary | ICD-10-CM

## 2015-03-27 DIAGNOSIS — I10 Essential (primary) hypertension: Secondary | ICD-10-CM | POA: Diagnosis not present

## 2015-03-27 DIAGNOSIS — I119 Hypertensive heart disease without heart failure: Secondary | ICD-10-CM

## 2015-03-27 DIAGNOSIS — I701 Atherosclerosis of renal artery: Secondary | ICD-10-CM

## 2015-03-27 NOTE — Patient Instructions (Signed)
Your physician wants you to follow-up in: 6 Months You will receive a reminder letter in the mail two months in advance. If you don't receive a letter, please call our office to schedule the follow-up appointment.  Your physician has recommended you make the following change in your medication: Take Hydralazine 50 mg in the morning and 75 mg at night

## 2015-03-28 ENCOUNTER — Telehealth: Payer: Self-pay | Admitting: Cardiovascular Disease

## 2015-03-28 MED ORDER — HYDRALAZINE HCL 25 MG PO TABS: ORAL_TABLET | ORAL | Status: DC

## 2015-03-28 NOTE — Telephone Encounter (Signed)
Spoke with [facility]- pharmacist Verbal given hydralazine 50 mg in morning and 75 mg in the night Will receive 25 mg tablets to fill prescription (take 2 in morning and 3 in the night)

## 2015-03-28 NOTE — Telephone Encounter (Signed)
Need a new prescription for Hydralazine 25 mg to add to what he is already taking. Please call this to [facility]. Pt is there waiting

## 2015-03-29 ENCOUNTER — Encounter: Payer: Self-pay | Admitting: Cardiovascular Disease

## 2015-03-29 NOTE — Progress Notes (Addendum)
Patient ID: Dylan York, male   DOB: October 03, 1926, 81 y.o.   MRN: [id number]     HPI: Dylan York is a 81 y.o. male who presents to the office for 4 month  followup cardiology evaluation.  Dylan York has a history of sick sinus syndrome with atrial tachycardia as well as sinus bradycardia, ventricular bigeminy, and frequent PVCs.  He had  stabilized with Bystolic but when he was seen on 03/08/2013 he was in atrial fibrillation of questionable duration. At that time I increased his Bystolic and his started anticoagulation with Eliquis 2.5 mg twice a day. An echo Doppler study on 03/30/2013 revealed normal systolic function with an ejection fraction of 60-65%, aortic valve sclerosis, biatrial enlargement, as well as mild pulmonary hypertension with a PA pressure of 41 mm. On 04/04/2013 AF 77 rate was beats per minute. He was initially started  on Multaq 400 milligrams twice a day but did not tolerate this and ultimately discontinue its use.  When  l saw him on 04/26/2013 I started him on amiodarone 200 mg daily. He has tolerated this well. 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  On 05/27/2013 I. further titrated his amiodarone to 200 mg twice a day. I also recommended that he reduce his Bystolic from 10 to 5 mg with a plan to do a cardioversion but as part of his preoperative assessment he was found to have pneumonia. His  pneumonia was treated with antibiotic therapy by Dr. [name] in [place]. On 08/05/2013 he was seen in the office as an add-on at which time he had significant accelerated hypertension with a blood pressure of 220/100. He was seen by Dylan York and admitted to [facility] hospital. He  was treated with IV Cardene and he converted to sinus rhythm. Ultimately nicardipine was discontinued and amlodipine was started as well as hydralazine. He was discharged home on 08/09/2013. Since going home, he  has taken his blood pressures religiously. These have been fairly well controlled. His heart rhythm has been fairly stable. He has noticed some mild ankle swelling right greater than left. Lower extremityDoppler studies were negative for DVT. On 08/23/2013  amlodipine was reduced from 10 to7.5 mg and I reduced his amiodarone from 400 mg to 300 mg. Subsequently, he now is on a further reduce dose of amlodipine at just 2.5 mg daily. He does note continued increasing lower extremity edema, right leg greater than left. He has not been taking his furosemide  20 mg regularly.  On 10/31/2013 I discontinued his amlodipine and recommended furosemide 20 mg daily due to his significant  pretibial and ankle edema. We also discussed sodium restriction. Since that time he has been using 20 -30 mm  support stockings below the knee with marked benefit.  Over the past year, he has had significant blood pressure lability.  Most recently he is taking Cardura 4 mg daily, hydralazine 50 mg twice a day, furosemide 20 mg daily, valsartan 320 mg.  He also is on levothyroxine 50 g for hypothyroidism.  He takes clonidine 0.1 mg if his blood pressure gets above 416 systolically.  He is also continuing to take amiodarone at 100 mg alternating with 200 mg and denies recurrent AF.  He is anticoagulated with eliquis.  He presents for evaluation. Dylan York duplex scan in February 2015 demonstrated right renal artery narrowing proximally in the range of 1-59%  with velocities at 213, suggesting the upper end of the range.  His left renal artery had normal pain. He  He has numerous large anechoic cysts in both kidneys with the largest measuring 8.9 x 7.6 x 8.0.  He is also followed by Dylan York urologically.  On 01/12/2015.  He underwent a follow-up renal duplex scan.  He again had mild bilateral renal artery narrowing at the right renal artery.  Increased velocity proximally at 185 in the left renal artery.  A 206 consistent with a 159%  range diameter reduction.  The left kidney again showed numerous an echo.  It cysts.  The 2 largest measured 7.08.3 x a 8.0 cm and 005.005.005.005 cm.  Left kidney could not accurately be measured in length.  Due to large cyst within the lower pole of the kidney.  Allergies  Allergen Reactions  . Multaq [Dronedarone] Shortness Of Breath    Current Outpatient Prescriptions  Medication Sig Dispense Refill  . amiodarone (PACERONE) 200 MG tablet Take 100 mg alternating with 200 mg the next day. 135 tablet 3  . apixaban (ELIQUIS) 2.5 MG TABS tablet Take 1 tablet (2.5 mg total) by mouth 2 (two) times daily. 60 tablet 11  . cloNIDine (CATAPRES) 0.1 MG tablet Take 1 tablet as needed for BP >190/100.  No more than 1 tablet daily or 3 tabs/week 30 tablet 0  . COMBIGAN 0.2-0.5 % ophthalmic solution Place 1 drop into both eyes 2 (two) times daily.    Marland Kitchen doxazosin (CARDURA) 4 MG tablet Take 1 tablet (4 mg total) by mouth daily. 30 tablet 5  . finasteride (PROSCAR) 5 MG tablet Take 5 mg by mouth daily.    . furosemide (LASIX) 20 MG tablet TAKE 1 TABLET BY MOUTH DAILY 30 tablet 12  . levothyroxine (SYNTHROID, LEVOTHROID) 50 MCG tablet Take 50 mcg by mouth daily.    Marland Kitchen LORazepam (ATIVAN) 1 MG tablet Take 0.5 mg by mouth at bedtime.     . Multiple Vitamins-Minerals (MULTIVITAL PO) Take 1 tablet by mouth daily.     Marland Kitchen omeprazole (PRILOSEC) 20 MG capsule TAKE 1 CAPSULE BY MOUTH 30 MINUTES BEFORE BREAKFAST 90 capsule 3  . potassium chloride (K-DUR,KLOR-CON) 10 MEQ tablet Take 1 tablet by mouth daily.    . valsartan (DIOVAN) 320 MG tablet Take 1 tablet (320 mg total) by mouth daily. 30 tablet 11  . hydrALAZINE (APRESOLINE) 25 MG tablet Take 2 tablets(50 mg)in morning and 3 tablets(63m) at night 150 tablet 6   No current facility-administered medications for this visit.    Socially he is married and has 3 children he quit tobacco in 1978. 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 He tries to play golf once  a week.  ROS General: Negative; No fevers, chills, or night sweats;  HEENT: Positive for history of glaucoma No changes in vision or hearing, sinus congestion, difficulty swallowing Pulmonary: Negative; No cough, wheezing, shortness of breath, hemoptysis Cardiovascular: Negative; No chest pain, presyncope, syncope, palpitations Positive for intermittent leg swelling GI: Negative; No nausea, vomiting, diarrhea, or abdominal pain GU: Positive for renal cysts; No dysuria, hematuria, or difficulty voiding Musculoskeletal: Negative; no myalgias, joint pain, or weakness Hematologic/Oncology: Negative; no easy bruising, bleeding Endocrine: Negative; no heat/cold intolerance; no diabetes Neuro: Negative; no changes in balance, headaches Skin: Negative; No rashes or skin lesions Psychiatric: Negative; No behavioral problems, depression Sleep: Negative; No snoring, daytime sleepiness, hypersomnolence, bruxism, restless legs, hypnogognic hallucinations, no cataplexy Other comprehensive 14 point system  review is negative.    PE BP 180/86 mmHg  Pulse 52  Ht _0  (1.727 m)  Wt 164 lb 11.2 oz (74.707 kg)  BMI 25.05 kg/m2   Repeat blood pressure by me was 180/802 supine and was 180/78 standing.  Wt Readings from Last 3 Encounters:  03/27/15 164 lb 11.2 oz (74.707 kg)  12/26/14 163 lb 3.2 oz (74.027 kg)  09/25/14 164 lb 3.2 oz (74.481 kg)   General: Alert, oriented, no distress.  Skin: normal turgor, no rashes HEENT: Normocephalic, atraumatic. Pupils round and reactive; sclera anicteric;no lid lag.  Nose without nasal septal hypertrophy Mouth/Parynx benign; Mallinpatti scale 3 Neck: No JVD, no carotid bruits with normal carotid upstroke. Chest wall: No tenderness to palpate Lungs: clear to ausculatation and percussion; no wheezing or rales Heart:  Regular rhythm but bradycardic with a ventricular rate in the upper 40s to mid 50s; no s3. 1/6 sem; no diastolic murmur. No rubs thrills or  heaves. Abdomen: soft, nontender; no hepatosplenomehaly, BS+; abdominal aorta nontender and not dilated by palpation. No definitive renal artery bruit Back: No CVA tenderness Pulses 2+ Extremities: Trace edema above the sock line bilaterally,  ;no clubbing cyanosis, Homan's sign negative  Neurologic: grossly nonfocal; cranial nerves grossly normal Psychologic: Normal affect and mood  ECG (independently read by me): Sinus bradycardia 52 bpm.  PR interval 198 ms, QTc interval 451 ms.  Nonspecific interventricular conduction delay.  February 2016 ECG (independently read by me, and (: Sinus bradycardia 48 bpm.  Nonspecific conduction delay.  No significant ST abnormalities.  QTc interval 434 ms.  November 2015 ECG (independently read by me):  Sinus bradycardia 52 bpm.  PR interval 216 ms.  No ectopy.  July 2015ECG (independently read by me): Sinus bradycardia with first degree AV block.  PR interval 240 ms.  Nonspecific ST changes.  PAC.  Prior 01/10/2014 ECG (independently read by me) sinus bradycardia 53 beats per minute. QTc interval 457 ms. No ectopy.  Prior 12/12/2013 ECG  (independently read by me): Sinus rhythm at 56 beats per minute. PR interval 206 ms. QTc interval 470 ms   Prior ECG of 10/31/2013: Normal sinus rhythm at 61 beats per minute. Isolated  PVC.  Non-specific intra-ventricular conduction delay. Nonspecific ST-T changes. QTc interval 461 ms.  LABS:  BMET  BMP Latest Ref Rng 08/06/2013 08/05/2013 07/21/2013  Glucose 70 - 99 mg/dL 97 132(H) 103(H)  BUN 6 - 23 mg/dL _1 Creatinine 0.50 - 1.35 mg/dL 0.96 1.04 1.16  Sodium 135 - 145 mEq/L 133(L) 135 135  Potassium 3.5 - 5.1 mEq/L 3.6 3.2(L) 3.7  Chloride 96 - 112 mEq/L 99 99 97  CO2 19 - 32 mEq/L _2 Calcium 8.4 - 10.5 mg/dL 8.3(L) 8.4 9.0     Hepatic Function Panel   Hepatic Function Latest Ref Rng 08/05/2013 07/21/2013  Total Protein 6.0 - 8.3 g/dL 6.0 6.3  Albumin 3.5 - 5.2 g/dL 2.9(L) 3.9  AST 0 - 37  U/L 28 24  ALT 0 - 53 U/L 33 21  Alk Phosphatase 39 - 117 U/L 74 66  Total Bilirubin 0.3 - 1.2 mg/dL 1.0 1.9(H)     CBC  CBC Latest Ref Rng 08/06/2013 08/05/2013 07/21/2013  WBC 4.0 - 10.5 K/uL 6.6 7.3 8.5  Hemoglobin 13.0 - 17.0 g/dL 13.3 13.1 14.4  Hematocrit 39.0 - 52.0 % 36.9(L) 37.3(L) 41.8  Platelets 150 - 400 K/uL 229 231 167     BNP No results found  for: PROBNP  Lipid Panel     Component Value Date/Time   CHOL 162 04/06/2013 0850   TRIG 84 04/06/2013 0850   HDL 53 04/06/2013 0850   CHOLHDL 3.1 04/06/2013 0850   VLDL 17 04/06/2013 0850   LDLCALC 92 04/06/2013 0850     RADIOLOGY: No results found.    ASSESSMENT AND PLAN: Mr. Manor is a young appearing 81 year old white male who is maintaining normal sinus rhythm on his current dose of amiodarone.  When I last saw him, I reduced his amiodarone to 100 alternating with  200 mg.  I have suggested that if he notes his resting pulse gets below 50, then he will reduce this to 100 mg daily.  He continues to have blood pressure lability.  His weight is fairly constant.  I'm recommending further titration of his hydralazine, such that he will take 50 mg in the morning and 75 mg at night.  He will continue to take valsartan as prescribed in addition to his current dose of Cardura.  He denies any orthostatic symptomatology.  He is tolerating eliquis for anticoagulation without bleeding.  I reviewed his renal Doppler study with him in detail.  His renal cysts appear stable.  He is followed by Dylan York who now is at [facility].  I will see him in 6 months for reevaluation.  Time spent: 25 minutes  Troy Sine, MD, [facility]  03/29/2015 9:02 PM

## 2015-04-19 ENCOUNTER — Other Ambulatory Visit (INDEPENDENT_AMBULATORY_CARE_PROVIDER_SITE_OTHER): Payer: Self-pay | Admitting: Internal Medicine

## 2015-05-23 ENCOUNTER — Other Ambulatory Visit: Payer: Self-pay | Admitting: Cardiovascular Disease

## 2015-05-23 NOTE — Telephone Encounter (Signed)
Rx(s) sent to pharmacy electronically.  

## 2015-07-02 DIAGNOSIS — N411 Chronic prostatitis: Secondary | ICD-10-CM | POA: Insufficient documentation

## 2015-09-24 ENCOUNTER — Encounter: Payer: Self-pay | Admitting: Cardiovascular Disease

## 2015-09-24 ENCOUNTER — Ambulatory Visit (INDEPENDENT_AMBULATORY_CARE_PROVIDER_SITE_OTHER): Payer: Medicare Other | Admitting: Cardiovascular Disease

## 2015-09-24 VITALS — BP 137/70 | HR 51 | Ht 68.0 in | Wt 162.1 lb

## 2015-09-24 DIAGNOSIS — I701 Atherosclerosis of renal artery: Secondary | ICD-10-CM

## 2015-09-24 DIAGNOSIS — N281 Cyst of kidney, acquired: Secondary | ICD-10-CM

## 2015-09-24 DIAGNOSIS — Z7901 Long term (current) use of anticoagulants: Secondary | ICD-10-CM | POA: Diagnosis not present

## 2015-09-24 DIAGNOSIS — I480 Paroxysmal atrial fibrillation: Secondary | ICD-10-CM | POA: Diagnosis not present

## 2015-09-24 DIAGNOSIS — I10 Essential (primary) hypertension: Secondary | ICD-10-CM

## 2015-09-24 DIAGNOSIS — I119 Hypertensive heart disease without heart failure: Secondary | ICD-10-CM

## 2015-09-24 DIAGNOSIS — I495 Sick sinus syndrome: Secondary | ICD-10-CM

## 2015-09-24 DIAGNOSIS — R0989 Other specified symptoms and signs involving the circulatory and respiratory systems: Secondary | ICD-10-CM

## 2015-09-24 DIAGNOSIS — Q6102 Congenital multiple renal cysts: Secondary | ICD-10-CM

## 2015-09-24 MED ORDER — AMIODARONE HCL 200 MG PO TABS: ORAL_TABLET | ORAL | Status: DC

## 2015-09-24 NOTE — Patient Instructions (Signed)
Your physician has recommended you make the following change in your medication: the amiodarone has been decreased to 100 mg daily.   Your physician wants you to follow-up in: 6 months or sooner if needed. You will receive a reminder letter in the mail two months in advance. If you don't receive a letter, please call our office to schedule the follow-up appointment.  If you need a refill on your cardiac medications before your next appointment, please call your pharmacy.

## 2015-09-26 ENCOUNTER — Encounter: Payer: Self-pay | Admitting: Cardiovascular Disease

## 2015-09-26 NOTE — Progress Notes (Signed)
Patient ID: Dylan York, male   DOB: 1926/01/28, 81 y.o.   MRN: [id number]     HPI: Dylan York is a 81 y.o. male who presents to the office for 6 month  followup cardiology evaluation.  Dylan York has a history of sick sinus syndrome with atrial tachycardia as well as sinus bradycardia, ventricular bigeminy, and frequent PVCs.  He had  stabilized with Bystolic but when he was seen on 03/08/2013 he was in atrial fibrillation of questionable duration. At that time I increased his Bystolic and his started anticoagulation with Eliquis 2.5 mg twice a day. An echo Doppler study on 03/30/2013 revealed normal systolic function with an ejection fraction of 60-65%, aortic valve sclerosis, biatrial enlargement, as well as mild pulmonary hypertension with a PA pressure of 41 mm. On 04/04/2013 AF 77 rate was beats per minute. He was initially started  on Multaq 400 milligrams twice a day but did not tolerate this and ultimately discontinue its use.  When  l saw him on 04/26/2013 I started him on amiodarone 200 mg daily. He has tolerated this well. 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  On 05/27/2013 I further titrated his amiodarone to 200 mg twice a day. I also recommended that he reduce his Bystolic from 10 to 5 mg with a plan to do a cardioversion but as part of his preoperative assessment he was found to have pneumonia. His  pneumonia was treated with antibiotic therapy by Dr. Woody Seller in [place]. On 08/05/2013 he was seen in the office as an add-on at which time he had significant accelerated hypertension with a blood pressure of 220/100. He was seen by [name] and admitted to [facility] [place] [facility]. He  was treated with IV Cardene and he converted to sinus rhythm. Ultimately nicardipine was discontinued and amlodipine was started as well as hydralazine. He was discharged home on 08/09/2013. Since going home, he  has taken his blood pressures religiously. These have been fairly well controlled. His heart rhythm has been fairly stable. He has noticed some mild ankle swelling right greater than left. Lower extremityDoppler studies were negative for DVT. On 08/23/2013  amlodipine was reduced from 10 to7.5 mg and I reduced his amiodarone from 400 mg to 300 mg. Subsequently, he now is on a further reduce dose of amlodipine at just 2.5 mg daily. He does note continued increasing lower extremity edema, right leg greater than left. He has not been taking his furosemide  20 mg regularly.  On 10/31/2013 I discontinued his amlodipine and recommended furosemide 20 mg daily due to his significant  pretibial and ankle edema. We also discussed sodium restriction. Since that time he has been using 20 -30 mm  support stockings below the knee with marked benefit.  A renal duplex scan in February 2015 demonstrated right renal artery narrowing proximally in the range of 1-59% with velocities at 213, suggesting the upper end of the range.  His left renal artery had normal pain. He  He has numerous large anechoic cysts in both kidneys with the largest measuring 8.9 x 7.6 x 8.0.  He is also followed by [name] urologically.  On 01/12/2015.  He underwent a follow-up renal duplex scan.  He again had mild bilateral renal artery narrowing at the right renal artery.  Increased velocity proximally at 185 in the left renal artery.  A 206 consistent  with a 159% range diameter reduction.  The left kidney again showed numerous an echo.  It cysts.  The 2 largest measured 7.08.3 x a 8.0 cm and 005.005.005.005 cm.  Left kidney could not accurately be measured in length.  Due to large cyst within the lower pole of the kidney.  He has had significant blood pressure lability, 6 months, he feels that his blood pressure has stabilized significantly.  Most recently he is taking Cardura 4 mg daily, hydralazine 50 mg twice a day , and an another dose at 25 mg,  furosemide 20 mg daily, valsartan 320 mg.  He also is on levothyroxine 50 g for hypothyroidism.  He takes clonidine 0.1 mg if his blood pressure gets above 967 systolically.  He is also continuing to take amiodarone at 100 mg alternating with 200 mg and denies recurrent AF.  He is anticoagulated with eliquis.  He is very analytical and over the past 6 months he states that he has taken 607 blood pressure readings.  32 readings or 5% revealed blood pressure elevation 591 systolically for which he is took clonidine 5 times or 0.8%.  Most of the time his blood pressure is stable, but 1 out of every 5 days he may not take his valsartan since he believes his blood pressure may be getting low.  He remains active.  He denies chest pain.  He is unaware of any breakthrough atrial fibrillation.  He presents for evaluation. .   Allergies  Allergen Reactions  . Multaq [Dronedarone] Shortness Of Breath    Current Outpatient Prescriptions  Medication Sig Dispense Refill  . amiodarone (PACERONE) 200 MG tablet Take 1/2 tablet daily. ( 100 mg) 90 tablet 3  . apixaban (ELIQUIS) 2.5 MG TABS tablet Take 1 tablet (2.5 mg total) by mouth 2 (two) times daily. 60 tablet 11  . cloNIDine (CATAPRES) 0.1 MG tablet Take 1 tablet as needed for BP >190/100.  No more than 1 tablet daily or 3 tabs/week 30 tablet 0  . COMBIGAN 0.2-0.5 % ophthalmic solution Place 1 drop into both eyes 2 (two) times daily.    Marland Kitchen doxazosin (CARDURA) 4 MG tablet TAKE 1 TABLET BY MOUTH EVERY DAY 30 tablet 10  . finasteride (PROSCAR) 5 MG tablet Take 5 mg by mouth daily.    . furosemide (LASIX) 20 MG tablet TAKE 1 TABLET BY MOUTH DAILY 30 tablet 12  . hydrALAZINE (APRESOLINE) 25 MG tablet Take 2 tablets(50 mg)in morning and 3 tablets($RemoveBefor'[id number]$ ) at night 150 tablet 6  . hydrALAZINE (APRESOLINE) 50 MG tablet Take 1 tablet by mouth 2 (two) times daily. Take 1 tab 2 times daily    . levothyroxine (SYNTHROID, LEVOTHROID) 50 MCG tablet Take 50 mcg by mouth daily.     Marland Kitchen LORazepam (ATIVAN) 1 MG tablet Take 0.5 mg by mouth at bedtime.     . Multiple Vitamins-Minerals (MULTIVITAL PO) Take 1 tablet by mouth daily.     Marland Kitchen omeprazole (PRILOSEC) 20 MG capsule TAKE 1 CAPSULE BY MOUTH 30 MINUTES BEFORE BREAKFAST 90 capsule 3  . potassium chloride (K-DUR,KLOR-CON) 10 MEQ tablet Take 1 tablet by mouth daily.    . valsartan (DIOVAN) 320 MG tablet Take 1 tablet (320 mg total) by mouth daily. 30 tablet 11   No current facility-administered medications for this visit.    Socially he is married and has 3 children he quit tobacco in 1978. 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  ROS General: Negative; No fevers, chills, or night sweats;  HEENT: Positive for history of glaucoma No changes in vision or hearing, sinus congestion, difficulty swallowing Pulmonary: Negative; No cough, wheezing, shortness of breath, hemoptysis Cardiovascular: Negative; No chest pain, presyncope, syncope, palpitations Positive for intermittent leg swelling GI: Negative; No nausea, vomiting, diarrhea, or abdominal pain GU: Positive for renal cysts; No dysuria, hematuria, or difficulty voiding Musculoskeletal: Negative; no myalgias, joint pain, or weakness Hematologic/Oncology: Negative; no easy bruising, bleeding Endocrine: Negative; no heat/cold intolerance; no diabetes Neuro: Negative; no changes in balance, headaches Skin: Negative; No rashes or skin lesions Psychiatric: Negative; No behavioral problems, depression Sleep: Negative; No snoring, daytime sleepiness, hypersomnolence, bruxism, restless legs, hypnogognic hallucinations, no cataplexy Other comprehensive 14 point system review is negative.    PE BP 137/70 mmHg  Pulse 51  Ht $R'5\' 8"'hg$  (1.727 m)  Wt 162 lb 1 oz (73.511 kg)  BMI 24.65 kg/m2   Repeat blood pressure by me was 180/802 supine and was 180/78 standing.  Wt Readings from Last 3 Encounters:  09/24/15 162 lb 1 oz (73.511 kg)   03/27/15 164 lb 11.2 oz (74.707 kg)  12/26/14 163 lb 3.2 oz (74.027 kg)   General: Alert, oriented, no distress.  Skin: normal turgor, no rashes HEENT: Normocephalic, atraumatic. Pupils round and reactive; sclera anicteric;no lid lag.  Nose without nasal septal hypertrophy Mouth/Parynx benign; Mallinpatti scale 3 Neck: No JVD, no carotid bruits with normal carotid upstroke. Chest wall: No tenderness to palpate Lungs: clear to ausculatation and percussion; no wheezing or rales Heart:  Regular rhythm but bradycardic with a ventricular rate in the upper 40s to mid 50s; no s3. 1/6 sem; no diastolic murmur. No rubs thrills or heaves. Abdomen: soft, nontender; no hepatosplenomehaly, BS+; abdominal aorta nontender and not dilated by palpation. No definitive renal artery bruit Back: No CVA tenderness Pulses 2+ Extremities: Trace edema above the sock line bilaterally,  ;no clubbing cyanosis, Homan's sign negative  Neurologic: grossly nonfocal; cranial nerves grossly normal Psychologic: Normal affect and mood  ECG (independently read by me): Sinus bradycardia, first-degree AV block.  Ventricular rate 51.  PR interval 218 ms.  Nondiagnostic ST changes  May 2016 ECG (independently read by me): Sinus bradycardia 52 bpm.  PR interval 198 ms, QTc interval 451 ms.  Nonspecific interventricular conduction delay.  February 2016 ECG (independently read by me, and (: Sinus bradycardia 48 bpm.  Nonspecific conduction delay.  No significant ST abnormalities.  QTc interval 434 ms.  November 2015 ECG (independently read by me):  Sinus bradycardia 52 bpm.  PR interval 216 ms.  No ectopy.  July 2015ECG (independently read by me): Sinus bradycardia with first degree AV block.  PR interval 240 ms.  Nonspecific ST changes.  PAC.  Prior 01/10/2014 ECG (independently read by me) sinus bradycardia 53 beats per minute. QTc interval 457 ms. No ectopy.  Prior 12/12/2013 ECG  (independently read by me): Sinus rhythm at  56 beats per minute. PR interval 206 ms. QTc interval 470 ms   Prior ECG of 10/31/2013: Normal sinus rhythm at 61 beats per minute. Isolated  PVC.  Non-specific intra-ventricular conduction delay. Nonspecific ST-T changes. QTc interval 461 ms.  LABS:  BMP Latest Ref Rng 08/06/2013 08/05/2013 07/21/2013  Glucose 70 - 99 mg/dL 97 132(H) 103(H)  BUN 6 - 23 mg/dL $Remove'15 16 14  'OaenuqR$ Creatinine 0.50 - 1.35 mg/dL 0.96 1.04 1.16  Sodium 135 - 145 mEq/L 133(L) 135 135  Potassium 3.5 - 5.1 mEq/L 3.6 3.2(L)  3.7  Chloride 96 - 112 mEq/L 99 99 97  CO2 19 - 32 mEq/L $Remove'23 22 28  'LhriYry$ Calcium 8.4 - 10.5 mg/dL 8.3(L) 8.4 9.0    Hepatic Function Latest Ref Rng 08/05/2013 07/21/2013  Total Protein 6.0 - 8.3 g/dL 6.0 6.3  Albumin 3.5 - 5.2 g/dL 2.9(L) 3.9  AST 0 - 37 U/L 28 24  ALT 0 - 53 U/L 33 21  Alk Phosphatase 39 - 117 U/L 74 66  Total Bilirubin 0.3 - 1.2 mg/dL 1.0 1.9(H)    CBC Latest Ref Rng 08/06/2013 08/05/2013 07/21/2013  WBC 4.0 - 10.5 K/uL 6.6 7.3 8.5  Hemoglobin 13.0 - 17.0 g/dL 13.3 13.1 14.4  Hematocrit 39.0 - 52.0 % 36.9(L) 37.3(L) 41.8  Platelets 150 - 400 K/uL 229 231 167   Lab Results  Component Value Date   MCV 90.0 08/06/2013   MCV 90.5 08/05/2013   MCV 93.7 07/21/2013   Lab Results  Component Value Date   TSH 3.520 08/05/2013   No results found for: HGBA1C   Lipid Panel     Component Value Date/Time   CHOL 162 04/06/2013 0850   TRIG 84 04/06/2013 0850   HDL 53 04/06/2013 0850   CHOLHDL 3.1 04/06/2013 0850   VLDL 17 04/06/2013 0850   LDLCALC 92 04/06/2013 0850      RADIOLOGY: No results found.    ASSESSMENT AND PLAN: Dylan York is a young appearing 81 year old white male who is maintaining normal sinus rhythm on his current dose of amiodarone.  When I last saw him, I reduced his amiodarone to 100 alternating with  200 mg. his blood pressure seems to have stabilized over the past 6 months on his current medical regimen and there were only 5 episodes where he took when necessary  clonidine.  He has not had any recurrent atrial fibrillation and with his bradycardia  I am now reducing his amiodarone to 100 mg daily.  His QTc interval is stable at 400 ms.  He will continue to monitor his blood pressure.  He denies any episodes of chest pain.  He denies difficulty with sleep.  There are no episodes of PND orthopnea.  He was not orthostatic on exam.  He has documented renal cysts on his renal duplex study which have appeared stable and is followed by [name].  I will try to obtain recent laboratory that was done in [place].  With him being on amiodarone, periodic surveillance of LFTs and thyroid function studies is necessary.  I will see him in 6 months for follow-up evaluation.  Time spent: 25 minutes  Troy Sine, MD, [facility]  09/26/2015 7:46 AM

## 2015-10-08 ENCOUNTER — Telehealth: Payer: Self-pay | Admitting: Cardiovascular Disease

## 2015-10-08 NOTE — Telephone Encounter (Signed)
Patient calling the office for samples of medication:   1.  What medication and dosage are you requesting samples for?Eliquis  2.  Are you currently out of this medication? No,just a few left,trying to make it through the donut holes

## 2015-10-08 NOTE — Telephone Encounter (Signed)
No samples available at present Patent aware.

## 2015-10-18 ENCOUNTER — Encounter (INDEPENDENT_AMBULATORY_CARE_PROVIDER_SITE_OTHER): Payer: Self-pay | Admitting: *Deleted

## 2015-10-19 ENCOUNTER — Other Ambulatory Visit: Payer: Self-pay | Admitting: Cardiovascular Disease

## 2015-10-19 ENCOUNTER — Telehealth: Payer: Self-pay | Admitting: Cardiovascular Disease

## 2015-10-19 NOTE — Telephone Encounter (Signed)
°  New Prob   Patient calling the office for samples of medication:   1.  What medication and dosage are you requesting samples for? Eliquis 2.5 mg  2.  Are you currently out of this medication? Pt has enough for 7 days

## 2015-10-22 NOTE — Telephone Encounter (Signed)
Mrs. Stancil is calling to get some samples of Eliquis , please call   Thanks

## 2015-10-23 NOTE — Telephone Encounter (Signed)
Notified we do not have any 2.5 mg eliquis samples.

## 2015-11-12 ENCOUNTER — Telehealth: Payer: Self-pay | Admitting: Cardiovascular Disease

## 2015-11-12 ENCOUNTER — Ambulatory Visit (INDEPENDENT_AMBULATORY_CARE_PROVIDER_SITE_OTHER): Payer: Medicare Other | Admitting: Internal Medicine

## 2015-11-12 ENCOUNTER — Other Ambulatory Visit: Payer: Self-pay | Admitting: Cardiovascular Disease

## 2015-11-12 MED ORDER — APIXABAN 2.5 MG PO TABS: 2.5000 mg | ORAL_TABLET | Freq: Two times a day (BID) | ORAL | Status: DC

## 2015-11-12 NOTE — Telephone Encounter (Signed)
Patient calling the office for samples of medication: ° ° °1.  What medication and dosage are you requesting samples for?  Eliquis  ° °2.  Are you currently out of this medication? no ° ° °

## 2015-11-12 NOTE — Telephone Encounter (Signed)
Please call [facility] [phone] to give them the correct directions for Amiodarone.

## 2015-11-12 NOTE — Telephone Encounter (Signed)
Rx(s) sent to pharmacy electronically. Spoke with pharmacy did not receive rx 09/24/2015  TY 1.9.2017

## 2015-11-12 NOTE — Telephone Encounter (Signed)
Pt notified samples available - understanding verbalized.

## 2015-11-12 NOTE — Telephone Encounter (Signed)
Called [facility] and clarified the script for Amiodarone 200mg , 1/2 tab daily.

## 2015-11-19 DIAGNOSIS — D649 Anemia, unspecified: Secondary | ICD-10-CM | POA: Diagnosis not present

## 2015-11-19 DIAGNOSIS — R5383 Other fatigue: Secondary | ICD-10-CM | POA: Diagnosis not present

## 2015-11-21 DIAGNOSIS — N281 Cyst of kidney, acquired: Secondary | ICD-10-CM | POA: Diagnosis not present

## 2015-11-21 DIAGNOSIS — R935 Abnormal findings on diagnostic imaging of other abdominal regions, including retroperitoneum: Secondary | ICD-10-CM | POA: Diagnosis not present

## 2015-11-21 DIAGNOSIS — R918 Other nonspecific abnormal finding of lung field: Secondary | ICD-10-CM | POA: Diagnosis not present

## 2015-11-27 DIAGNOSIS — Z85828 Personal history of other malignant neoplasm of skin: Secondary | ICD-10-CM | POA: Diagnosis not present

## 2015-11-27 DIAGNOSIS — L905 Scar conditions and fibrosis of skin: Secondary | ICD-10-CM | POA: Diagnosis not present

## 2015-11-27 DIAGNOSIS — L820 Inflamed seborrheic keratosis: Secondary | ICD-10-CM | POA: Diagnosis not present

## 2015-11-27 DIAGNOSIS — L718 Other rosacea: Secondary | ICD-10-CM | POA: Diagnosis not present

## 2015-12-05 DIAGNOSIS — Z789 Other specified health status: Secondary | ICD-10-CM | POA: Diagnosis not present

## 2015-12-05 DIAGNOSIS — J329 Chronic sinusitis, unspecified: Secondary | ICD-10-CM | POA: Diagnosis not present

## 2015-12-20 ENCOUNTER — Other Ambulatory Visit: Payer: Self-pay | Admitting: Cardiovascular Disease

## 2015-12-21 ENCOUNTER — Encounter: Payer: Self-pay | Admitting: Cardiovascular Disease

## 2015-12-21 ENCOUNTER — Telehealth: Payer: Self-pay | Admitting: *Deleted

## 2015-12-21 NOTE — Telephone Encounter (Signed)
PT CALLED AND NEEDS SOME INFORMATION ON HIS MEDICATIONS, NOT AWARE OF THE ISSUE HERE AT [facility] NOTHING IN HIS CHART WHERE SOMEONE TRIED TO CALL HIM AS HE STATED, PLEASE CALL PT AND DISCUSS THIS WITH HIM.

## 2015-12-24 NOTE — Telephone Encounter (Signed)
Rx(s) sent to pharmacy electronically.  

## 2015-12-24 NOTE — Telephone Encounter (Signed)
Follow up       *STAT* If patient is at the pharmacy, call can be transferred to refill team.   1. Which medications need to be refilled? (please list name of each medication and dose if known) hydralazine 50mg  and eliquis 2.5mg  2. Which pharmacy/location (including street and city if local pharmacy) is medication to be sent to? [facility]  3. Do they need a 30 day or 90 day supply? 90 day supply

## 2016-01-01 ENCOUNTER — Encounter (INDEPENDENT_AMBULATORY_CARE_PROVIDER_SITE_OTHER): Payer: Self-pay | Admitting: Internal Medicine

## 2016-01-01 ENCOUNTER — Encounter (INDEPENDENT_AMBULATORY_CARE_PROVIDER_SITE_OTHER): Payer: Self-pay | Admitting: *Deleted

## 2016-01-01 ENCOUNTER — Other Ambulatory Visit (INDEPENDENT_AMBULATORY_CARE_PROVIDER_SITE_OTHER): Payer: Self-pay | Admitting: Internal Medicine

## 2016-01-01 ENCOUNTER — Ambulatory Visit (INDEPENDENT_AMBULATORY_CARE_PROVIDER_SITE_OTHER): Payer: Medicare Other | Admitting: Internal Medicine

## 2016-01-01 VITALS — BP 150/90 | HR 60 | Temp 98.0°F | Resp 18 | Ht 68.0 in | Wt 159.7 lb

## 2016-01-01 DIAGNOSIS — R143 Flatulence: Secondary | ICD-10-CM

## 2016-01-01 MED ORDER — SIMETHICONE 180 MG PO CAPS: 180.0000 mg | ORAL_CAPSULE | Freq: Three times a day (TID) | ORAL | Status: DC

## 2016-01-01 NOTE — Progress Notes (Signed)
Reason for consultation:  Bloating and early satiety.  History of present illness:  Patient is 82 year old Caucasian male who is referred through the courtesy of Dr. Woody Seller for GI evaluation. Patient presents with progressive bloating and early satiety. Patient was seen for similar symptoms back in Fab[place] 2011 and workup was negative for peptic ulcer disease or biliary tract disease. Patient states his bloating is getting worse. He feels it is affecting quality of his life. He feels fine when he wakes up in the morning. Bloating starts after he is breakfast and it gets worse after lunch and evening meal. He complains of early satiety. He says he does not eat large meals. In spite of this he has not lost any weight recently. He states he passes to much flatus. Quite often he does not have control and it is embarrassing to him. He generally has 3 bowel movements per week. Most of his stools are formed. He denies abdominal pain melena or rectal bleeding. He also denies nausea vomiting heartburn or dysphagia. Few weeks ago he took Amoxil for 7 days but not sure if it helped his bloating. He stays busy. He exercises regularly. He has been doing water aerobics until 2 months ago. He is hoping to resume this activity soon. He is not sure if any particular foods result in more bloating than with others. He has undergone multiple imaging studies in the past. He was found to have very tiny gallbladder polyp which was documented to be stable on follow-up ultrasound. Last colonoscopy was in May 2004 revealing internal hemorrhoids.   Current Medications: Outpatient Encounter Prescriptions as of 01/01/2016  Medication Sig  . amiodarone (PACERONE) 200 MG tablet Take 1/2 tablet daily. ( 100 mg)  . apixaban (ELIQUIS) 2.5 MG TABS tablet Take 1 tablet (2.5 mg total) by mouth 2 (two) times daily.  . cloNIDine (CATAPRES) 0.1 MG tablet Take 1 tablet as needed for BP >190/100.  No more than 1 tablet daily or 3 tabs/week   . COMBIGAN 0.2-0.5 % ophthalmic solution Place 1 drop into both eyes 2 (two) times daily.  Marland Kitchen doxazosin (CARDURA) 4 MG tablet TAKE 1 TABLET BY MOUTH EVERY DAY  . finasteride (PROSCAR) 5 MG tablet Take 5 mg by mouth daily.  . furosemide (LASIX) 20 MG tablet TAKE 1 TABLET BY MOUTH EVERY DAY  . hydrALAZINE (APRESOLINE) 25 MG tablet Take 2 tablets(50 mg)in morning and 3 tablets(75mg ) at night  . levothyroxine (SYNTHROID, LEVOTHROID) 50 MCG tablet Take 50 mcg by mouth daily.  . Multiple Vitamins-Minerals (MULTIVITAL PO) Take 1 tablet by mouth daily.   Marland Kitchen omeprazole (PRILOSEC) 20 MG capsule TAKE 1 CAPSULE BY MOUTH 30 MINUTES BEFORE BREAKFAST  . potassium chloride (K-DUR,KLOR-CON) 10 MEQ tablet Take 1 tablet by mouth daily.  . valsartan (DIOVAN) 320 MG tablet Take 1 tablet (320 mg total) by mouth daily.  . [DISCONTINUED] amiodarone (PACERONE) 200 MG tablet TAKE 1/2 TABLET BY MOUTH EVERY OTHER DAY ALTERNATING WITH 1 TABLET EVERY OTHER DAY (PER PT HE TAKES HALF DAILY) (Patient not taking: Reported on 01/01/2016)  . [DISCONTINUED] hydrALAZINE (APRESOLINE) 50 MG tablet Take 1 tablet by mouth 2 (two) times daily. Reported on 01/01/2016  . [DISCONTINUED] hydrALAZINE (APRESOLINE) 50 MG tablet Take 1 tablet (50 mg total) by mouth 2 (two) times daily. (Patient not taking: Reported on 01/01/2016)  . [DISCONTINUED] LORazepam (ATIVAN) 1 MG tablet Take 0.5 mg by mouth at bedtime. Reported on 01/01/2016   No facility-administered encounter medications on file as of 01/01/2016.  Past Medical History  Diagnosis Date  . Hypertension   . Tricuspid regurgitation   . GERD (gastroesophageal reflux disease)   . Atrial fibrillation.    .    . BPH (benign prostatic hyperplasia)       . Thyroid disease        Colonoscopy in May 2004 revealing internal hemorrhoids.      Esophagogastroduodenoscopy on 01/04/2010 revealing small sliding hiatal hernia and mild changes of reflux esophagitis limited to GE junction.    Past  Surgical History  Procedure Laterality Date  .  umbilicus hernia repair 2. Secondary repair performed with mesh.     .  TURP in 1985     . Appendectomy at age 5.      Allergies: Allergies  Allergen Reactions  . Multaq [Dronedarone] Shortness Of Breath    Family history: Mother died of stomach cancer at age 26. She was diagnosed at age 78. Father died at 86 of atherosclerotic disease. His sister died of dementia at age 41.  Social history: He is married and accompanied by his wife today(second marriage). He has 2 biologic and one stepchild. He worked at [employer] for 35 years. He does not smoke cigarettes or drink alcohol.   Physical examination: Blood pressure 150/90, pulse 60, temperature 98 F (36.7 C), temperature source Oral, resp. rate 18, height 5\' 8"  (1.727 m), weight 159 lb 11.2 oz (72.439 kg). Patient is alert and in no acute distress. He appears younger than stated age. Conjunctiva is pink. Sclera is nonicteric Oropharyngeal mucosa is normal. No neck masses or thyromegaly noted. Cardiac exam with regular rhythm normal S1 and S2. No murmur or gallop noted. Lungs are clear to auscultation. Abdomen is full. Bowel sounds are normal. On palpation abdomen is soft and nontender without organomegaly or masses. Percussion note is to be somewhat tympanitic across midabdomen. Rectal examination deferred. No LE edema or clubbing noted.  Labs/studies Results: Plain abdominal film on 10/12/2015 revealed nonspecific gas pattern.   Abdominopelvic CT with contrast films reviewed from 11/21/2015  No evidence of ascites or adenopathy. No evidence of dilated small or large bowel. Bilateral renal cysts left larger than the right.    Assessment:  #1. Chronic bloating which appears to have been progressive. Patient does not have any  alarm symptoms. Bloating appears to be affecting quality of his life. He underwent evaluation in 2011 with EGD and abdominal  ultrasound. Need to rule out small intestinal bacterial overgrowth. If this study is negative and he does not respond to empiric therapy will consider EGD and gastric emptying study. #2. Chronic PPI use. He was begun on PPI following EGD in March 2011 for mild changes of reflux esophagitis limited to GE junction. He is not having any GERD symptoms and possibly can come off this medication eventually.    Recommendations:  Phazyme 180 mg by mouth 3 times a day. Schedule hydrogen breath test looking for small intestinal bacterial overgrowth. Decrease omeprazole to 20 mg every other day.

## 2016-01-01 NOTE — Patient Instructions (Addendum)
Phazyme 180 mg by mouth three times a day. Try omeprazole 20 mg by mouth every other day. 10 go back to taking it daily if heartburn recurs. Hydrogen breath test to be scheduled.

## 2016-01-03 ENCOUNTER — Encounter: Payer: Self-pay | Admitting: Pharmacist Clinician (PhC)/ Clinical Pharmacy Specialist

## 2016-01-03 ENCOUNTER — Ambulatory Visit (INDEPENDENT_AMBULATORY_CARE_PROVIDER_SITE_OTHER): Payer: Medicare Other | Admitting: Pharmacist Clinician (PhC)/ Clinical Pharmacy Specialist

## 2016-01-03 VITALS — BP 218/96 | Ht 68.0 in | Wt 160.3 lb

## 2016-01-03 DIAGNOSIS — I10 Essential (primary) hypertension: Secondary | ICD-10-CM | POA: Diagnosis not present

## 2016-01-03 DIAGNOSIS — R0989 Other specified symptoms and signs involving the circulatory and respiratory systems: Secondary | ICD-10-CM

## 2016-01-03 MED ORDER — CHLORTHALIDONE 25 MG PO TABS: ORAL_TABLET | ORAL | Status: DC

## 2016-01-03 NOTE — Assessment & Plan Note (Signed)
Mr. Boender is quite stressed this morning about the increased need for clonidine.  By his count he took 5 tablets in 100 days from May to November, then 7 tablets in the next 100 days from November to Feb 28.  His pressure is 218/96 in the office.  For that I asked him to go home, rest for awhile and re-check in about 2 hours.  If it still is elevated to > 190, take the clonidine tablet.  As for his trending upward, I told him he should not be skipping his valsartan.  He will switch the times of his hydralazine to am, 5-7 pm and bedtime (he was taking am, 8-9 pm and 11pm).  I have encouraged him on several occasions to not take his blood pressure so many times in a day, but he continues with a minimum of three times daily, occasionally 6 or 7 times.  I will see him back for follow up in a month.

## 2016-01-03 NOTE — Progress Notes (Signed)
01/03/2016 ADAN DEWAARD January 24, 1926 [id number]   HPI:  Dylan York is a 82 y.o. male patient of Dr Claiborne Billings, with a PMH below who presents today for hypertension clinic evaluation.  I have seen him in the past, about 18 months ago and at that time, although his reading would vary 30-40 points per day, he was rarely using clonidine. He scheduled the appointment today because he has taken more clonidine recently.  He keeps meticulous records of his health, and reports having taken 7 clonidine tablets in the past 100 days, including 3 in the past 10 days.  He continues to check his BP at home 3-5 times daily.  He also admits to having skipped his valsartan 320 mg on occasional days if his morning pressure was "too low".    Cardiac Hx: atrial tachycardia, tricuspid regurgitation  Social Hx: no tobacco (quit in 40+ years ago), no alcohol  Diet: no added salt  Exercise: stays active at home, but no formal exercise  Home BP readings: has checked BP 5 or more times per day - range 102/45 - 211/92.  12/30 readings were > 0000000 systolic.    Current antihypertensive medications: valsartan 320 mg qam, hydralazine 50 mg bid and 25 mg hs, doxazosin 4 mg qhs; clonidine 0.1 mg for systolic BP > 99991111    Wt Readings from Last 3 Encounters:  01/03/16 160 lb 4.8 oz (72.712 kg)  01/01/16 159 lb 11.2 oz (72.439 kg)  09/24/15 162 lb 1 oz (73.511 kg)   BP Readings from Last 3 Encounters:  01/03/16 218/96  01/01/16 150/90  09/24/15 137/70   Pulse Readings from Last 3 Encounters:  01/01/16 60  09/24/15 51  03/27/15 52    Current Outpatient Prescriptions  Medication Sig Dispense Refill  . amiodarone (PACERONE) 200 MG tablet Take 1/2 tablet daily. ( 100 mg) 90 tablet 3  . apixaban (ELIQUIS) 2.5 MG TABS tablet Take 1 tablet (2.5 mg total) by mouth 2 (two) times daily. 60 tablet 9  . chlorthalidone (HYGROTON) 25 MG tablet Take 1/2 -1 tablet by mouth daily as directed 30 tablet 3  . cloNIDine  (CATAPRES) 0.1 MG tablet Take 1 tablet as needed for BP >190/100.  No more than 1 tablet daily or 3 tabs/week 30 tablet 0  . COMBIGAN 0.2-0.5 % ophthalmic solution Place 1 drop into both eyes 2 (two) times daily.    Marland Kitchen doxazosin (CARDURA) 4 MG tablet TAKE 1 TABLET BY MOUTH EVERY DAY 30 tablet 10  . finasteride (PROSCAR) 5 MG tablet Take 5 mg by mouth daily.    . furosemide (LASIX) 20 MG tablet Take 20 mg by mouth daily as needed for fluid.    . hydrALAZINE (APRESOLINE) 25 MG tablet Take 2 tablets(50 mg)in morning and 3 tablets(75mg ) at night (Patient taking differently: Take 25 mg by mouth at bedtime. ) 150 tablet 6  . hydrALAZINE (APRESOLINE) 50 MG tablet Take 50 tablets by mouth 2 (two) times daily.    Marland Kitchen levothyroxine (SYNTHROID, LEVOTHROID) 50 MCG tablet Take 50 mcg by mouth daily.    . Multiple Vitamins-Minerals (MULTIVITAL PO) Take 1 tablet by mouth daily.     Marland Kitchen omeprazole (PRILOSEC) 20 MG capsule Take 20 mg by mouth every other day.    . potassium chloride (K-DUR,KLOR-CON) 10 MEQ tablet Take 1 tablet by mouth daily as needed (along with furosemide).     . Simethicone 180 MG CAPS Take 1 capsule (180 mg total) by mouth 3 (three)  times daily after meals.  0  . valsartan (DIOVAN) 320 MG tablet Take 1 tablet (320 mg total) by mouth daily. 30 tablet 11   No current facility-administered medications for this visit.    Allergies  Allergen Reactions  . Multaq [Dronedarone] Shortness Of Breath    Past Medical History  Diagnosis Date  . Hypertension   . Tricuspid regurgitation   . GERD (gastroesophageal reflux disease)   . Atrial tachycardia ([place])   . Pneumonia   . BPH (benign prostatic hyperplasia)   . Sinus bradycardia, with HR in the 40s BB stopped. 08/08/2013  . Thyroid disease     Blood pressure 218/96, height 5\' 8"  (1.727 m), weight 160 lb 4.8 oz (72.712 kg).    Tommy Medal PharmD CPP [facility]

## 2016-01-03 NOTE — Patient Instructions (Addendum)
Return for a a follow up appointment in 1 month  Your blood pressure today is  212/96  Check your blood pressure at home daily and keep record of the readings.  Take your BP meds as follows: add chlorthalidone 12.5 mg (1/2 tablet)  continue current medications, switch evening hydralazine 50 mg to dinner, at least 4 hours from bedtime dose  Bring all of your meds, your BP cuff and your record of home blood pressures to your next appointment.  Exercise as you're able, try to walk approximately 30 minutes per day.  Keep salt intake to a minimum, especially watch canned and prepared boxed foods.  Eat more fresh fruits and vegetables and fewer canned items.  Avoid eating in fast food restaurants.    HOW TO TAKE YOUR BLOOD PRESSURE: . Rest 5 minutes before taking your blood pressure. .  Don't smoke or drink caffeinated beverages for at least 30 minutes before. . Take your blood pressure before (not after) you eat. . Sit comfortably with your back supported and both feet on the floor (don't cross your legs). . Elevate your arm to heart level on a table or a desk. . Use the proper sized cuff. It should fit smoothly and snugly around your bare upper arm. There should be enough room to slip a fingertip under the cuff. The bottom edge of the cuff should be 1 inch above the crease of the elbow. . Ideally, take 3 measurements at one sitting and record the average.

## 2016-01-08 ENCOUNTER — Telehealth: Payer: Self-pay | Admitting: Pharmacist Clinician (PhC)/ Clinical Pharmacy Specialist

## 2016-01-08 NOTE — Telephone Encounter (Signed)
Returned call to patient, all information given regarding f/u labwork, BP visit, med questions. Addressed concerns to patient's satisfaction.

## 2016-01-08 NOTE — Telephone Encounter (Signed)
Follow Up   Pt request a call back to determine if a sooner BP check is needed prior to 01/31/2016. Please call back to discuss.

## 2016-01-14 ENCOUNTER — Encounter (HOSPITAL_COMMUNITY): Payer: Self-pay | Admitting: *Deleted

## 2016-01-14 ENCOUNTER — Other Ambulatory Visit: Payer: Self-pay | Admitting: Cardiovascular Disease

## 2016-01-14 ENCOUNTER — Telehealth (INDEPENDENT_AMBULATORY_CARE_PROVIDER_SITE_OTHER): Payer: Self-pay | Admitting: Internal Medicine

## 2016-01-14 ENCOUNTER — Ambulatory Visit (HOSPITAL_COMMUNITY)
Admission: RE | Admit: 2016-01-14 | Discharge: 2016-01-14 | Disposition: A | Discharge status: Home / Self Care | Payer: Medicare Other | Source: Ambulatory Visit | Attending: Internal Medicine | Admitting: Internal Medicine

## 2016-01-14 ENCOUNTER — Encounter (HOSPITAL_COMMUNITY)
Admission: RE | Disposition: A | Discharge status: Home / Self Care | Payer: Self-pay | Source: Ambulatory Visit | Attending: Internal Medicine

## 2016-01-14 DIAGNOSIS — R143 Flatulence: Secondary | ICD-10-CM

## 2016-01-14 DIAGNOSIS — R140 Abdominal distension (gaseous): Secondary | ICD-10-CM | POA: Diagnosis not present

## 2016-01-14 DIAGNOSIS — A049 Bacterial intestinal infection, unspecified: Secondary | ICD-10-CM | POA: Insufficient documentation

## 2016-01-14 HISTORY — PX: BACTERIAL OVERGROWTH TEST: SHX5739

## 2016-01-14 SURGERY — BREATH TEST, FOR INTESTINAL BACTERIAL OVERGROWTH

## 2016-01-14 MED ORDER — LACTULOSE 10 GM/15ML PO SOLN
25.0000 g | Freq: Once | ORAL | Status: AC
  Administered 2016-01-14: 25 g via ORAL

## 2016-01-14 MED ORDER — LACTULOSE 10 GM/15ML PO SOLN
ORAL | Status: AC
  Filled 2016-01-14: qty 60

## 2016-01-14 MED ORDER — METRONIDAZOLE 250 MG PO TABS: 250.0000 mg | ORAL_TABLET | Freq: Three times a day (TID) | ORAL | Status: DC

## 2016-01-14 NOTE — Telephone Encounter (Signed)
Patient was called and made aware that he is to take the Flagyl by mouth 3 times daily with meals. The Phyzyme is to be taken on an as needed basis.

## 2016-01-14 NOTE — H&P (Signed)
Dylan York is an 82 y.o. male.   Chief Complaint: Patient is here for bacterial overgrowth study. HPI: Patient is 82 year old Caucasian male who presents with worsening postprandial bloating and excessive flatulence affecting quality of his life. He does not have diarrhea or weight loss. He is undergoing hydrogen rest test to determine if he has small intestinal bacterial overgrowth.  Past Medical History  Diagnosis Date  . Hypertension   . Tricuspid regurgitation   . GERD (gastroesophageal reflux disease)   . Atrial tachycardia ([place])   . Pneumonia   . BPH (benign prostatic hyperplasia)   . Sinus bradycardia, with HR in the 40s BB stopped. 08/08/2013  . Thyroid disease     Past Surgical History  Procedure Laterality Date  . Hernia repair    . Prostate surgery    . Appendectomy      Family History  Problem Relation Age of Onset  . Cancer Mother   . Heart Problems Father     atherosclerosis  . Cancer - Prostate Paternal Grandfather    Social History:  reports that he quit smoking about 39 years ago. He has never used smokeless tobacco. He reports that he does not drink alcohol or use illicit drugs.  Allergies:  Allergies  Allergen Reactions  . Multaq [Dronedarone] Shortness Of Breath    Medications Prior to Admission  Medication Sig Dispense Refill  . amiodarone (PACERONE) 200 MG tablet Take 1/2 tablet daily. ( 100 mg) 90 tablet 3  . apixaban (ELIQUIS) 2.5 MG TABS tablet Take 1 tablet (2.5 mg total) by mouth 2 (two) times daily. 60 tablet 9  . chlorthalidone (HYGROTON) 25 MG tablet Take 1/2 -1 tablet by mouth daily as directed 30 tablet 3  . COMBIGAN 0.2-0.5 % ophthalmic solution Place 1 drop into both eyes 2 (two) times daily.    Marland Kitchen doxazosin (CARDURA) 4 MG tablet TAKE 1 TABLET BY MOUTH EVERY DAY 30 tablet 10  . finasteride (PROSCAR) 5 MG tablet Take 5 mg by mouth daily.    . hydrALAZINE (APRESOLINE) 25 MG tablet Take 2 tablets(50 mg)in morning and 3 tablets(75mg ) at  night (Patient taking differently: Take 25 mg by mouth at bedtime. ) 150 tablet 6  . hydrALAZINE (APRESOLINE) 50 MG tablet Take 50 tablets by mouth 2 (two) times daily.    Marland Kitchen levothyroxine (SYNTHROID, LEVOTHROID) 50 MCG tablet Take 50 mcg by mouth daily.    . Multiple Vitamins-Minerals (MULTIVITAL PO) Take 1 tablet by mouth daily.     Marland Kitchen omeprazole (PRILOSEC) 20 MG capsule Take 20 mg by mouth every other day.    . potassium chloride (K-DUR,KLOR-CON) 10 MEQ tablet Take 1 tablet by mouth daily as needed (along with furosemide).     . Simethicone 180 MG CAPS Take 1 capsule (180 mg total) by mouth 3 (three) times daily after meals.  0  . valsartan (DIOVAN) 320 MG tablet Take 1 tablet (320 mg total) by mouth daily. 30 tablet 11  . cloNIDine (CATAPRES) 0.1 MG tablet Take 1 tablet as needed for BP >190/100.  No more than 1 tablet daily or 3 tabs/week 30 tablet 0  . furosemide (LASIX) 20 MG tablet Take 20 mg by mouth daily as needed for fluid.      No results found for this or any previous visit (from the past 48 hour(s)). No results found.  ROS  There were no vitals taken for this visit. Physical Exam   Assessment/Plan Intractable bloating. Patient is undergoing small intestinal bacterial  overgrowth study.  Rogene Houston, MD 01/14/2016, 7:57 AM

## 2016-01-14 NOTE — Op Note (Addendum)
NAME: Dylan York        ACCOUNT NO: [id number]  MEDICAL RECORD NO: [id number]   PATIENT TYPE: AMB  LOCATION: DAY HOSP.      FACILITY: [facility]  Referring physician: Glenda Chroman, MD  Date of procedure: 01/14/2016  PROCEDURE: Bacterial overgrowth study.  INDICATION: Patient is a 82 year old Caucasian male who presents with intractable post prandial bloating and uncontrollable flatus affecting quality of his life.  PROTOCOL: Patient followed and met all the pre-conditions for this test to be performed. Patient was given 25 g of lactulose in 8 ounces of water and alveolar air samples were collected every 15 minutes with total of 180 minutes with 12 samples in all.  FINDINGS: Alveolar hydrogen concentration as follows in parts per million. Baseline:          4 ppm 15 minutes:       5 ppm 30 minutes:      10 ppm 45 minutes:      11 ppm 60 minutes:      17 ppm 75 minutes:      19 ppm 90 minutes:       30 ppm 105 minutes:     38 ppm 120 minutes:      38 ppm 135 minutes:      45  ppm 150 minutes:       28 ppm 175 minutes:       37 ppm 118 minutes:       45 ppm  ASSESSMENT: Study is abnormal confirming small intestinal bacterial overgrowth. Results were reviewed with the patient.  RECOMMENDATIONS: Patient given prescription for metronidazole 250 mg by mouth 3 times a day for 2 weeks. He should advised to keep daily symptom diary and call with progress report when he finishes antibiotic.

## 2016-01-14 NOTE — Telephone Encounter (Signed)
Dylan York called saying he was given a new Rx today after his Hydrogen Breath Test to take after meals. He's wondering if this new Rx should replace the Phazyme he was given 1-2 weeks ago to take after meals. He'd like a phone call regarding this.  Pt's ph# [phone] Thank you.

## 2016-01-14 NOTE — OR Nursing (Signed)
No beans, bran or high fiber cereal the day before the procedure? NO NPO except for water 12 hours before procedure?YES No smoking, sleeping or vigorous exercising for at least 30 before procedure? NO  Recent antibiotic use and/or diarrhea? NO    If yes, physician notified.  Time Baseline 15 mins 30 mins 45 mins 60 mins 75 mins 90 mins 105 mins 120 mins 135 mins 150 mins 165 mins 180 mins  H2-ppm 4 5 10 11 17   19   30     38   38   45    28    37 45

## 2016-01-14 NOTE — Discharge Instructions (Signed)
Keep daily symptom diary as to severity of bloating while taking antibiotic. Call office with progress report when you finish metronidazole.

## 2016-01-14 NOTE — Telephone Encounter (Signed)
REFILL 

## 2016-01-15 ENCOUNTER — Telehealth: Payer: Self-pay | Admitting: Cardiovascular Disease

## 2016-01-15 ENCOUNTER — Telehealth: Payer: Self-pay | Admitting: Pharmacist Clinician (PhC)/ Clinical Pharmacy Specialist

## 2016-01-15 ENCOUNTER — Encounter (HOSPITAL_COMMUNITY): Payer: Self-pay | Admitting: Internal Medicine

## 2016-01-15 DIAGNOSIS — I10 Essential (primary) hypertension: Secondary | ICD-10-CM | POA: Diagnosis not present

## 2016-01-15 NOTE — Telephone Encounter (Signed)
[place] is calling with critical Labs

## 2016-01-15 NOTE — Telephone Encounter (Signed)
Labs for starting chlorthalidone

## 2016-01-18 DIAGNOSIS — G4700 Insomnia, unspecified: Secondary | ICD-10-CM | POA: Diagnosis not present

## 2016-01-18 DIAGNOSIS — I10 Essential (primary) hypertension: Secondary | ICD-10-CM | POA: Diagnosis not present

## 2016-01-18 NOTE — Telephone Encounter (Signed)
Received labs, sodium 127, potassium 3.6.  Advised patient to hold chlorthalidone for now, take furosemide prn only.  Will repeat labs at return visit on March 30

## 2016-01-31 ENCOUNTER — Encounter: Payer: Self-pay | Admitting: Pharmacist Clinician (PhC)/ Clinical Pharmacy Specialist

## 2016-01-31 ENCOUNTER — Ambulatory Visit (INDEPENDENT_AMBULATORY_CARE_PROVIDER_SITE_OTHER): Payer: Medicare Other | Admitting: Pharmacist Clinician (PhC)/ Clinical Pharmacy Specialist

## 2016-01-31 VITALS — BP 200/118 | Ht 68.0 in | Wt 163.2 lb

## 2016-01-31 DIAGNOSIS — R0989 Other specified symptoms and signs involving the circulatory and respiratory systems: Secondary | ICD-10-CM

## 2016-01-31 DIAGNOSIS — I10 Essential (primary) hypertension: Secondary | ICD-10-CM

## 2016-01-31 LAB — BASIC METABOLIC PANEL
BUN: 16 mg/dL (ref 7–25)
CALCIUM: 9 mg/dL (ref 8.6–10.3)
CO2: 22 mmol/L (ref 20–31)
CREATININE: 1 mg/dL (ref 0.70–1.11)
Chloride: 104 mmol/L (ref 98–110)
Glucose, Bld: 96 mg/dL (ref 65–99)
Potassium: 4.3 mmol/L (ref 3.5–5.3)
Sodium: 138 mmol/L (ref 135–146)

## 2016-01-31 NOTE — Progress Notes (Signed)
01/31/2016 ANTHONYJOSEPH JUSZCZAK 12/12/1925 [id number]   HPI:  Dylan York is a 82 y.o. male patient of Dr Claiborne Billings, with a PMH below who presents today for hypertension clinic follow up.  I have seen him several times in the past, he keeps meticulous records of his home BP and will call when he notes that readings start to go up or he takes extra clonidine (he takes for systolic pressure > 99991111).  At his last visit, he was concerned about increase in clonidine (7 tabs in 3 months), but admitted to skipping his valsartan several different days because of low morning readings.  He was advised to not skip doses, as this would be more likely to cause swings in his BP.  He tends toward white coat hypertension, home readings usually in range, office readings commonly up to A999333 systolic.  At his last visit we started him on chlorthalidone, but a BMET soon after, showed low potassium and sodium and it was discontinued.  There was little change in his home averages after stopping the medication.  Today he complains of more AF symptoms, feels he has been in AF more frequently.  His HR has been over 100 on several occasions and he feels "dragged out" and tired.    Cardiac Hx: atrial tachycardia, tricuspid regurgitation  Social Hx: no tobacco (quit in 40+ years ago), no alcohol  Diet: no added salt  Exercise: stays active at home, but no formal exercise  Home BP readings: continues to check 4-6 times per day; overall average 120/60 with high of 161/76 and low of 85/39.   Current antihypertensive medications: valsartan 320 mg qam, hydralazine 50 mg bid and 25 mg hs, doxazosin 4 mg qhs; clonidine 0.1 mg for systolic BP > 99991111    Wt Readings from Last 3 Encounters:  01/31/16 163 lb 3.2 oz (74.027 kg)  01/03/16 160 lb 4.8 oz (72.712 kg)  01/01/16 159 lb 11.2 oz (72.439 kg)   BP Readings from Last 3 Encounters:  01/31/16 200/118  01/03/16 218/96  01/01/16 150/90   Pulse Readings from Last 3 Encounters:    01/01/16 60  09/24/15 51  03/27/15 52    Current Outpatient Prescriptions  Medication Sig Dispense Refill  . amiodarone (PACERONE) 200 MG tablet Take 1/2 tablet daily. ( 100 mg) 90 tablet 3  . apixaban (ELIQUIS) 2.5 MG TABS tablet Take 1 tablet (2.5 mg total) by mouth 2 (two) times daily. 60 tablet 9  . chlorthalidone (HYGROTON) 25 MG tablet Take 1/2 -1 tablet by mouth daily as directed 30 tablet 3  . cloNIDine (CATAPRES) 0.1 MG tablet Take 1 tablet as needed for BP >190/100.  No more than 1 tablet daily or 3 tabs/week 30 tablet 0  . COMBIGAN 0.2-0.5 % ophthalmic solution Place 1 drop into both eyes 2 (two) times daily.    Marland Kitchen doxazosin (CARDURA) 4 MG tablet TAKE 1 TABLET BY MOUTH EVERY DAY 30 tablet 10  . finasteride (PROSCAR) 5 MG tablet Take 5 mg by mouth daily.    . furosemide (LASIX) 20 MG tablet Take 20 mg by mouth daily as needed for fluid.    . hydrALAZINE (APRESOLINE) 25 MG tablet Take 2 tablets(50 mg)in morning and 3 tablets(75mg ) at night (Patient taking differently: Take 25 mg by mouth at bedtime. ) 150 tablet 6  . hydrALAZINE (APRESOLINE) 50 MG tablet Take 50 tablets by mouth 2 (two) times daily.    Marland Kitchen levothyroxine (SYNTHROID, LEVOTHROID) 50 MCG tablet  Take 50 mcg by mouth daily.    . metroNIDAZOLE (FLAGYL) 250 MG tablet Take 1 tablet (250 mg total) by mouth 3 (three) times daily. 42 tablet 0  . Multiple Vitamins-Minerals (MULTIVITAL PO) Take 1 tablet by mouth daily.     Marland Kitchen omeprazole (PRILOSEC) 20 MG capsule Take 20 mg by mouth every other day.    . potassium chloride (K-DUR,KLOR-CON) 10 MEQ tablet Take 1 tablet by mouth daily as needed (along with furosemide).     . Simethicone 180 MG CAPS Take 1 capsule (180 mg total) by mouth 3 (three) times daily after meals.  0  . valsartan (DIOVAN) 320 MG tablet TAKE 1 TABLET BY MOUTH EVERY DAY 30 tablet 3   No current facility-administered medications for this visit.    Allergies  Allergen Reactions  . Multaq [Dronedarone] Shortness  Of Breath    Past Medical History  Diagnosis Date  . Hypertension   . Tricuspid regurgitation   . GERD (gastroesophageal reflux disease)   . Atrial tachycardia ([place])   . Pneumonia   . BPH (benign prostatic hyperplasia)   . Sinus bradycardia, with HR in the 40s BB stopped. 08/08/2013  . Thyroid disease     Blood pressure 200/118, height 5\' 8"  (1.727 m), weight 163 lb 3.2 oz (74.027 kg).    Tommy Medal PharmD CPP [facility]

## 2016-01-31 NOTE — Patient Instructions (Addendum)
Return for a a follow up appointment with Dr. Claiborne Billings for May.  If you feel that you're in AF more frequently, call the office to see if we can get you in to see a PA sooner  Your blood pressure today is 200/118  Check your blood pressure at home daily and keep record of the readings.  Take your BP meds as follows: continue with all current medications.    Bring all of your meds, your BP cuff and your record of home blood pressures to your next appointment.  Exercise as you're able, try to walk approximately 30 minutes per day.  Keep salt intake to a minimum, especially watch canned and prepared boxed foods.  Eat more fresh fruits and vegetables and fewer canned items.  Avoid eating in fast food restaurants.    HOW TO TAKE YOUR BLOOD PRESSURE: . Rest 5 minutes before taking your blood pressure. .  Don't smoke or drink caffeinated beverages for at least 30 minutes before. . Take your blood pressure before (not after) you eat. . Sit comfortably with your back supported and both feet on the floor (don't cross your legs). . Elevate your arm to heart level on a table or a desk. . Use the proper sized cuff. It should fit smoothly and snugly around your bare upper arm. There should be enough room to slip a fingertip under the cuff. The bottom edge of the cuff should be 1 inch above the crease of the elbow. . Ideally, take 3 measurements at one sitting and record the average.

## 2016-02-01 NOTE — Assessment & Plan Note (Signed)
Although his BP spikes in the office, his home readings are very well controlled.  He does occasionally have low readings, but has not had any problems with these.  He will continue with his current regimen and I will have him repeat a BMET today because of the previous hyponatremia and hypokalemia.  I advised him to schedule an appointment with Dr. Claiborne Billings for the next month or two to discuss his AF concerns, but also stated that he can call for a same day appointment at [facility] should the symptoms be bothersome.

## 2016-02-04 ENCOUNTER — Telehealth: Payer: Self-pay | Admitting: *Deleted

## 2016-02-04 NOTE — Telephone Encounter (Signed)
Patient's wife notified of lab results.

## 2016-02-04 NOTE — Telephone Encounter (Signed)
-----   Message from Troy Sine, MD sent at 02/02/2016  2:23 PM EDT ----- Lab good

## 2016-02-05 DIAGNOSIS — D485 Neoplasm of uncertain behavior of skin: Secondary | ICD-10-CM | POA: Diagnosis not present

## 2016-02-05 DIAGNOSIS — Z85828 Personal history of other malignant neoplasm of skin: Secondary | ICD-10-CM | POA: Diagnosis not present

## 2016-02-05 DIAGNOSIS — D0439 Carcinoma in situ of skin of other parts of face: Secondary | ICD-10-CM | POA: Diagnosis not present

## 2016-02-07 ENCOUNTER — Telehealth (INDEPENDENT_AMBULATORY_CARE_PROVIDER_SITE_OTHER): Payer: Self-pay | Admitting: *Deleted

## 2016-02-07 ENCOUNTER — Encounter: Payer: Self-pay | Admitting: Cardiovascular Disease

## 2016-02-07 ENCOUNTER — Ambulatory Visit (INDEPENDENT_AMBULATORY_CARE_PROVIDER_SITE_OTHER): Payer: Medicare Other | Admitting: Cardiovascular Disease

## 2016-02-07 VITALS — BP 162/78 | HR 53 | Ht 68.0 in | Wt 161.4 lb

## 2016-02-07 DIAGNOSIS — Z7901 Long term (current) use of anticoagulants: Secondary | ICD-10-CM

## 2016-02-07 DIAGNOSIS — I495 Sick sinus syndrome: Secondary | ICD-10-CM

## 2016-02-07 DIAGNOSIS — E039 Hypothyroidism, unspecified: Secondary | ICD-10-CM

## 2016-02-07 DIAGNOSIS — Q6102 Congenital multiple renal cysts: Secondary | ICD-10-CM

## 2016-02-07 DIAGNOSIS — N281 Cyst of kidney, acquired: Secondary | ICD-10-CM

## 2016-02-07 DIAGNOSIS — I119 Hypertensive heart disease without heart failure: Secondary | ICD-10-CM | POA: Diagnosis not present

## 2016-02-07 DIAGNOSIS — I480 Paroxysmal atrial fibrillation: Secondary | ICD-10-CM | POA: Diagnosis not present

## 2016-02-07 MED ORDER — AMIODARONE HCL 200 MG PO TABS: ORAL_TABLET | ORAL | Status: DC

## 2016-02-07 MED ORDER — HYDRALAZINE HCL 50 MG PO TABS: 50.0000 mg | ORAL_TABLET | Freq: Three times a day (TID) | ORAL | Status: DC

## 2016-02-07 NOTE — Telephone Encounter (Addendum)
Patient called stated he had hydrogen breath test 01/14/16 at [facility], per the patient it was positive.  Took Metronidazole for 2 weeks, he stated he was to report back to Dr. Laural Golden after 2 weeks.  Bloating seems to be ok.  He wants to know if he needs to continue medication?  Is the bacteria gone "killed" forever.  He wants to know what to do.  [place] patient to clarify which medication he wanted to know if he needed to continue, left message.  Pt did call back and I advised him that I would send the message to Tammy with hopes that she will be able to get him an answer today, but if not it may be week after next.

## 2016-02-07 NOTE — Patient Instructions (Signed)
Your physician has recommended you make the following change in your medication:   1.) the hydralazine has been changed to 50 mg every 8 hours.  2.) the amiodarone has been changed to 1 tablet alternating with 1/2 tablet daily.  Your physician recommends that you schedule a follow-up appointment in:  3 months.

## 2016-02-09 ENCOUNTER — Encounter: Payer: Self-pay | Admitting: Cardiovascular Disease

## 2016-02-09 NOTE — Progress Notes (Signed)
Patient ID: Dylan York, male   DOB: 03-02-1926, 82 y.o.   MRN: [id number]     HPI: Dylan York is a 82 y.o. male who presents to the office for 5 month  followup cardiology evaluation.  Dylan York has a history of sick sinus syndrome with atrial tachycardia as well as sinus bradycardia, ventricular bigeminy, and frequent PVCs.  He had  stabilized with Bystolic but when he was seen on 03/08/2013 he was in atrial fibrillation of questionable duration. At that time I increased his Bystolic and his started anticoagulation with Eliquis 2.5 mg twice a day. An echo Doppler study on 03/30/2013 revealed normal systolic function with an ejection fraction of 60-65%, aortic valve sclerosis, biatrial enlargement, as well as mild pulmonary hypertension with a PA pressure of 41 mm. On 04/04/2013 AF 77 rate was beats per minute. He was initially started  on Multaq 400 milligrams twice a day but did not tolerate this and ultimately discontinue its use.  When  l saw him on 04/26/2013 I started him on amiodarone 200 mg daily. He has tolerated this well. 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  On 05/27/2013 I further titrated his amiodarone to 200 mg twice a day. I also recommended that he reduce his Bystolic from 10 to 5 mg with a plan to do a cardioversion but as part of his preoperative assessment he was found to have pneumonia. His  pneumonia was treated with antibiotic therapy by Dr. Woody Seller in [place]. On 08/05/2013 he was seen in the office as an add-on at which time he had significant accelerated hypertension with a blood pressure of 220/100. He was seen by [name] and admitted to [facility] hospital. He  was treated with IV Cardene and he converted to sinus rhythm. Ultimately nicardipine was discontinued and amlodipine was started as well as hydralazine. He was discharged home on 08/09/2013. Since going home, he  has taken his blood pressures religiously. These have been fairly well controlled. His heart rhythm has been fairly stable. He has noticed some mild ankle swelling right greater than left. Lower extremityDoppler studies were negative for DVT. On 08/23/2013  amlodipine was reduced from 10 to7.5 mg and I reduced his amiodarone from 400 mg to 300 mg. Subsequently, he now is on a further reduce dose of amlodipine at just 2.5 mg daily. He does note continued increasing lower extremity edema, right leg greater than left. He has not been taking his furosemide  20 mg regularly.  On 10/31/2013 I discontinued his amlodipine and recommended furosemide 20 mg daily due to his significant  pretibial and ankle edema. We also discussed sodium restriction. Since that time he has been using 20 -30 mm  support stockings below the knee with marked benefit.  A renal duplex scan in February 2015 demonstrated right renal artery narrowing proximally in the range of 1-59% with velocities at 213, suggesting the upper end of the range.  His left renal artery had normal pain. He  He has numerous large anechoic cysts in both kidneys with the largest measuring 8.9 x 7.6 x 8.0.  He is also followed by [name] urologically.  On 01/12/2015.  He underwent a follow-up renal duplex scan.  He again had mild bilateral renal artery narrowing at the right renal artery.  Increased velocity proximally at 185 in the left renal artery.  A 206 consistent  with a 159% range diameter reduction.  The left kidney again showed numerous an echo.  It cysts.  The 2 largest measured 7.08.3 x a 8.0 cm and 005.005.005.005 cm.  Left kidney could not accurately be measured in length.  Due to large cyst within the lower pole of the kidney.  Dylan York has had problems with significant blood pressure lability.  When I last saw him he felt that  had significant blood pressure had somewhat stabilized.  At that time he was taking Cardura 4 mg daily, hydralazine 50 mg twice  a day , and an another dose at 25 mg, furosemide 20 mg daily, valsartan 320 mg.  He also is on levothyroxine 50 g for hypothyroidism.  He takes clonidine 0.1 mg if his blood pressure gets above 606 systolically.  He is also continuing to take amiodarone at 100 mg alternating with 200 mg and denies recurrent AF.  He is anticoagulated with eliquis.  He is very analytical and over the past 6 months he states that he has taken 607 blood pressure readings.  32 readings or 5% revealed blood pressure elevation 301 systolically for which he is took clonidine 5 times or 0.8%.    Since I last saw him, he has seen Kristen on several occasions with significant blood pressure lability with blood pressure readings in excess of 601 systolically and diastolically over 90.  He also states he believes at some time.  His blood pressure may get low.  She had tried him on chlorthalidone, but a subsequent be met showed low potassium and sodium and it was discontinued.  At times he has noticed a sensation where he may have had transient atrial fibrillation.  He believes he had episodes of this heart rate irregularity over the past 2 days.  He presents for evaluation.  Allergies  Allergen Reactions  . Multaq [Dronedarone] Shortness Of Breath    Current Outpatient Prescriptions  Medication Sig Dispense Refill  . amiodarone (PACERONE) 200 MG tablet Take 1/2 tablet daily alternating with 1 tablet daily. 90 tablet 3  . apixaban (ELIQUIS) 2.5 MG TABS tablet Take 1 tablet (2.5 mg total) by mouth 2 (two) times daily. 60 tablet 9  . cloNIDine (CATAPRES) 0.1 MG tablet Take 1 tablet as needed for BP >190/100.  No more than 1 tablet daily or 3 tabs/week 30 tablet 0  . COMBIGAN 0.2-0.5 % ophthalmic solution Place 1 drop into both eyes 2 (two) times daily.    Marland Kitchen doxazosin (CARDURA) 4 MG tablet TAKE 1 TABLET BY MOUTH EVERY DAY 30 tablet 10  . finasteride (PROSCAR) 5 MG tablet Take 5 mg by mouth daily.    . furosemide (LASIX) 20 MG  tablet Take 20 mg by mouth daily as needed for fluid.    Marland Kitchen levothyroxine (SYNTHROID, LEVOTHROID) 50 MCG tablet Take 50 mcg by mouth daily.    Marland Kitchen LORazepam (ATIVAN) 1 MG tablet TAKE 1/2 TABLET OR 1 TABLET BY MOUTH AT BEDTIME AS NEEDED  2  . Multiple Vitamins-Minerals (MULTIVITAL PO) Take 1 tablet by mouth daily.     Marland Kitchen omeprazole (PRILOSEC) 20 MG capsule Take 20 mg by mouth every other day.    . potassium chloride (K-DUR,KLOR-CON) 10 MEQ tablet Take 1 tablet by mouth daily as needed (along with furosemide).     . valsartan (DIOVAN) 320 MG tablet TAKE 1 TABLET BY MOUTH EVERY DAY 30 tablet 3  . hydrALAZINE (APRESOLINE) 50 MG tablet Take 1 tablet (50 mg total) by mouth every  8 (eight) hours. 270 tablet 3   No current facility-administered medications for this visit.    Socially he is married and has 3 children he quit tobacco in 1978. 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  ROS General: Negative; No fevers, chills, or night sweats;  HEENT: Positive for history of glaucoma No changes in vision or hearing, sinus congestion, difficulty swallowing Pulmonary: Negative; No cough, wheezing, shortness of breath, hemoptysis Cardiovascular: Negative; No chest pain, presyncope, syncope, palpitations Positive for intermittent leg swelling GI: Negative; No nausea, vomiting, diarrhea, or abdominal pain GU: Positive for renal cysts; No dysuria, hematuria, or difficulty voiding Musculoskeletal: Negative; no myalgias, joint pain, or weakness Hematologic/Oncology: Negative; no easy bruising, bleeding Endocrine: Negative; no heat/cold intolerance; no diabetes Neuro: Negative; no changes in balance, headaches Skin: Negative; No rashes or skin lesions Psychiatric: Negative; No behavioral problems, depression Sleep: Negative; No snoring, daytime sleepiness, hypersomnolence, bruxism, restless legs, hypnogognic hallucinations, no cataplexy Other comprehensive 14 point  system review is negative.    PE BP 162/78 mmHg  Pulse 53  Ht 5' 8" (1.727 m)  Wt 161 lb 6.4 oz (73.211 kg)  BMI 24.55 kg/m2   Repeat blood pressure by me was 178/80 supine and was 176/78 standing.  Wt Readings from Last 3 Encounters:  02/07/16 161 lb 6.4 oz (73.211 kg)  01/31/16 163 lb 3.2 oz (74.027 kg)  01/03/16 160 lb 4.8 oz (72.712 kg)   General: Alert, oriented, no distress.  Skin: normal turgor, no rashes HEENT: Normocephalic, atraumatic. Pupils round and reactive; sclera anicteric;no lid lag.  Nose without nasal septal hypertrophy Mouth/Parynx benign; Mallinpatti scale 3 Neck: No JVD, no carotid bruits with normal carotid upstroke. Chest wall: No tenderness to palpate Lungs: clear to ausculatation and percussion; no wheezing or rales Heart:  Regular rhythm but bradycardic with a ventricular rate in the mid 50s; no s3. 1/6 sem; no diastolic murmur. No rubs thrills or heaves. Abdomen: soft, nontender; no hepatosplenomehaly, BS+; abdominal aorta nontender and not dilated by palpation. No definitive renal artery bruit Back: No CVA tenderness Pulses 2+ Extremities: Trace edema above the sock line bilaterally,  ;no clubbing cyanosis, Homan's sign negative  Neurologic: grossly nonfocal; cranial nerves grossly normal Psychologic: Normal affect and mood  ECG (independently read by me): August bradycardia at 53 bpm.  Sinus arrhythmia.  Nonspecific ST changes.  09/24/2015 ECG (independently read by me): Sinus bradycardia, first-degree AV block.  Ventricular rate 51.  PR interval 218 ms.  Nondiagnostic ST changes  May 2016 ECG (independently read by me): Sinus bradycardia 52 bpm.  PR interval 198 ms, QTc interval 451 ms.  Nonspecific interventricular conduction delay.  February 2016 ECG (independently read by me, and (: Sinus bradycardia 48 bpm.  Nonspecific conduction delay.  No significant ST abnormalities.  QTc interval 434 ms.  November 2015 ECG (independently read by me):   Sinus bradycardia 52 bpm.  PR interval 216 ms.  No ectopy.  July 2015ECG (independently read by me): Sinus bradycardia with first degree AV block.  PR interval 240 ms.  Nonspecific ST changes.  PAC.  Prior 01/10/2014 ECG (independently read by me) sinus bradycardia 53 beats per minute. QTc interval 457 ms. No ectopy.  Prior 12/12/2013 ECG  (independently read by me): Sinus rhythm at 56 beats per minute. PR interval 206 ms. QTc interval 470 ms   Prior ECG of 10/31/2013: Normal sinus rhythm at 61 beats per minute. Isolated  PVC.  Non-specific intra-ventricular conduction delay. Nonspecific ST-T changes. QTc interval 461 ms.  LABS:  BMP Latest Ref Rng 01/31/2016 08/06/2013 08/05/2013  Glucose 65 - 99 mg/dL 96 97 132(H)  BUN 7 - 25 mg/dL _0 Creatinine 0.70 - 1.11 mg/dL 1.00 0.96 1.04  Sodium 135 - 146 mmol/L 138 133(L) 135  Potassium 3.5 - 5.3 mmol/L 4.3 3.6 3.2(L)  Chloride 98 - 110 mmol/L 104 99 99  CO2 20 - 31 mmol/L _1 Calcium 8.6 - 10.3 mg/dL 9.0 8.3(L) 8.4    Hepatic Function Latest Ref Rng 08/05/2013 07/21/2013  Total Protein 6.0 - 8.3 g/dL 6.0 6.3  Albumin 3.5 - 5.2 g/dL 2.9(L) 3.9  AST 0 - 37 U/L 28 24  ALT 0 - 53 U/L 33 21  Alk Phosphatase 39 - 117 U/L 74 66  Total Bilirubin 0.3 - 1.2 mg/dL 1.0 1.9(H)    CBC Latest Ref Rng 08/06/2013 08/05/2013 07/21/2013  WBC 4.0 - 10.5 K/uL 6.6 7.3 8.5  Hemoglobin 13.0 - 17.0 g/dL 13.3 13.1 14.4  Hematocrit 39.0 - 52.0 % 36.9(L) 37.3(L) 41.8  Platelets 150 - 400 K/uL 229 231 167   Lab Results  Component Value Date   MCV 90.0 08/06/2013   MCV 90.5 08/05/2013   MCV 93.7 07/21/2013   Lab Results  Component Value Date   TSH 3.520 08/05/2013   No results found for: HGBA1C   Lipid Panel     Component Value Date/Time   CHOL 162 04/06/2013 0850   TRIG 84 04/06/2013 0850   HDL 53 04/06/2013 0850   CHOLHDL 3.1 04/06/2013 0850   VLDL 17 04/06/2013 0850   LDLCALC 92 04/06/2013 0850      RADIOLOGY: No results  found.    ASSESSMENT AND PLAN: Dylan York is a young appearing 82 year old white male who has a history of PAF and had been  maintaining normal sinus rhythm on amiodarone to 100 grams daily.  He continues to have significant blood pressure lability.  He did not tolerate chlorthalidone due to low potassium and low sodium.  With his recent blood pressure elevation.  I again have suggested he try taking his medications in a more standard way and have suggested he take hydralazine 50 mg every 8 hours.  He also takes Cardura 4 mg daily and has been taking valsartan 320 mg daily.  If he does have episodes where his blood pressure may get low.  He can try reducing his valsartan and take this in a divided regimen of 160 mg twice a day.  By his history it sounds as though he may have had breakthrough atrial fibrillation and I have suggested he try increasing his amiodarone to alternating 100 mg with 200 mg every other day as long as he does not become significantly bradycardic.  He has an try coagulated on Eliquis at 2.5 mg twice a day.  He continues to be on levothyroxine at 50 g.  He has a prescription for furosemide which he takes only as needed and has trivial edema above his sock line.  He does not take clonidine routinely but uses this to take 1 tablet if his blood pressure had risen above 163 systolically.  He will continue to monitor his blood pressure and hopefully with this more even distribution of medications rather than taking the medicines the way he had been previously his blood pressure will stabilize.  He has documented renal cysts on his renal duplex study which have appeared stable and  is followed by [name].  I will try to obtain recent laboratory that was done in [place].  With him being on amiodarone, periodic surveillance of LFTs and thyroid function studies is necessary.  I will see him in 3 months for follow-up evaluation.  Time spent: 25 minutes  Troy Sine, MD, [facility]   02/09/2016 11:26 AM

## 2016-02-12 DIAGNOSIS — Z85828 Personal history of other malignant neoplasm of skin: Secondary | ICD-10-CM | POA: Diagnosis not present

## 2016-02-12 DIAGNOSIS — D0439 Carcinoma in situ of skin of other parts of face: Secondary | ICD-10-CM | POA: Diagnosis not present

## 2016-02-12 NOTE — Telephone Encounter (Signed)
Patient was called , message was left on his voicemail. This will be addressed with Dr. Laural Golden and we will call the patient with his recommendations.

## 2016-02-13 ENCOUNTER — Telehealth: Payer: Self-pay | Admitting: Cardiovascular Disease

## 2016-02-13 NOTE — Telephone Encounter (Signed)
Clarification given on meds, refills authorized.

## 2016-02-13 NOTE — Telephone Encounter (Signed)
New message    Dylan York is calling because pt has two new drugs and she do not have a prescription with the new directions listed  Pt is in the Drug store waiting  Amiodarone 200mg    Hydralazine 50mg 

## 2016-02-18 DIAGNOSIS — Z961 Presence of intraocular lens: Secondary | ICD-10-CM | POA: Diagnosis not present

## 2016-02-18 DIAGNOSIS — H401191 Primary open-angle glaucoma, unspecified eye, mild stage: Secondary | ICD-10-CM | POA: Diagnosis not present

## 2016-02-18 NOTE — Telephone Encounter (Signed)
Patient's call returned. Patient will call when bloating becomes intractable in which case he will be retreated.

## 2016-02-21 ENCOUNTER — Other Ambulatory Visit: Payer: Self-pay | Admitting: Pharmacist Clinician (PhC)/ Clinical Pharmacy Specialist

## 2016-02-28 DIAGNOSIS — M47816 Spondylosis without myelopathy or radiculopathy, lumbar region: Secondary | ICD-10-CM | POA: Diagnosis not present

## 2016-02-28 DIAGNOSIS — M47812 Spondylosis without myelopathy or radiculopathy, cervical region: Secondary | ICD-10-CM | POA: Diagnosis not present

## 2016-02-28 DIAGNOSIS — M546 Pain in thoracic spine: Secondary | ICD-10-CM | POA: Diagnosis not present

## 2016-02-28 DIAGNOSIS — M9902 Segmental and somatic dysfunction of thoracic region: Secondary | ICD-10-CM | POA: Diagnosis not present

## 2016-02-28 DIAGNOSIS — M9903 Segmental and somatic dysfunction of lumbar region: Secondary | ICD-10-CM | POA: Diagnosis not present

## 2016-02-28 DIAGNOSIS — M9901 Segmental and somatic dysfunction of cervical region: Secondary | ICD-10-CM | POA: Diagnosis not present

## 2016-03-13 DIAGNOSIS — M9902 Segmental and somatic dysfunction of thoracic region: Secondary | ICD-10-CM | POA: Diagnosis not present

## 2016-03-13 DIAGNOSIS — M9901 Segmental and somatic dysfunction of cervical region: Secondary | ICD-10-CM | POA: Diagnosis not present

## 2016-03-13 DIAGNOSIS — M47812 Spondylosis without myelopathy or radiculopathy, cervical region: Secondary | ICD-10-CM | POA: Diagnosis not present

## 2016-03-13 DIAGNOSIS — M546 Pain in thoracic spine: Secondary | ICD-10-CM | POA: Diagnosis not present

## 2016-03-13 DIAGNOSIS — M9903 Segmental and somatic dysfunction of lumbar region: Secondary | ICD-10-CM | POA: Diagnosis not present

## 2016-03-13 DIAGNOSIS — M47816 Spondylosis without myelopathy or radiculopathy, lumbar region: Secondary | ICD-10-CM | POA: Diagnosis not present

## 2016-03-27 ENCOUNTER — Telehealth: Payer: Self-pay | Admitting: Cardiology

## 2016-03-27 NOTE — Telephone Encounter (Signed)
Received a phone from patient about his blood pressure being elevated this afternoon around [id number] systolic. States he has taken all of his normal medications, and took a 0.1mg  clonidine this afternoon which did improve his blood pressure to the 160s. But blood pressure has increased again. I advised him to take another 0.1mg  clonidine, but he did not want to take another. I advised that if the blood pressure remained elevated and he became symptomatic to go the ED.

## 2016-03-28 ENCOUNTER — Telehealth: Payer: Self-pay | Admitting: Cardiovascular Disease

## 2016-03-28 NOTE — Telephone Encounter (Signed)
Returned call to pt. He says his BP's have been erratic. He says in the evenings they usually get high in the evenings. Readings are usually around 179/77 or 181/81. Last night, it was 213/96 and 200/92. He denies H/A, CP, blurred vision, SOB, sweating or nausea with these elevated readings. He called and spoke with NP last evening who advised him to take an extra clonidine and if his BP stayed elevated to go to the ER. Patient did not take the clonidine last night because he had already taken an extra dose in the afternoon yesterday. This morning his BP was down with a reading of 100/54. Pulse is usually between 55-65. He says he feels dizzy when his BPs are low.  Patient reported on 03/20/16 his BP was as low as 70/37 and 94/51. He did not call notify us about those readings until now. Appt made for pt to be seen in BP clinic for 04/15/16.  Will route to Dr Oval Linsey, DOD, and pharmacist for additional advice.

## 2016-03-28 NOTE — Telephone Encounter (Signed)
New message      Pt c/o BP issue: STAT if pt c/o blurred vision, one-sided weakness or slurred speech  1. What are your last 5 BP readings? 5/26 132/70, around 10 pm 5/25 213/96  2. Are you having any other symptoms (ex. Dizziness, headache, blurred vision, passed out)? no  3. What is your BP issue? The pt seems to think it is coming form the medication. The pt is concerned about the 200 reading of the blood pressure.    Pt c/o medication issue:  1. Name of Medication: Hydralazine / Valsatan 2. How are you currently taking this medication (dosage and times per day)? 15 mg every 8 hrs/ 320 mg taking at noon  3. Are you having a reaction (difficulty breathing--STAT)? no  4. What is your medication issue? The pt seems to feel that the medications all the medications together might be the problem

## 2016-03-29 NOTE — Telephone Encounter (Signed)
Returned call to patient.  He reports BP trend is to be low/normal until about 5 pm, then a sharp increase in pressure.  Morning readings [id number] systolic and evening as high as 160-220.  This has been an ongoing patter for several years for him and with adjusting meds by time of day has had little to no impact on his patterns.  He does skip doses of morning meds when BP is < 110 and sometimes will skip mid day hydralazine dose as well.  He reports that any BP < 110 tends to make him feel "drunk".  Asked that he not skip the afternoon hydralazine, and only skip the morning dose for low pressure.  Asked him to stop skipping morning valsartan as well.  Will review with Dr. Claiborne Billings, but for now asked patient to make those adjustments and let me know if that helps.  He does have BP appt with me scheduled for June.  Patient voiced understanding.

## 2016-04-14 ENCOUNTER — Other Ambulatory Visit: Payer: Self-pay | Admitting: Cardiovascular Disease

## 2016-04-15 ENCOUNTER — Ambulatory Visit (INDEPENDENT_AMBULATORY_CARE_PROVIDER_SITE_OTHER): Payer: Medicare Other | Admitting: Pharmacist Clinician (PhC)/ Clinical Pharmacy Specialist

## 2016-04-15 ENCOUNTER — Ambulatory Visit (INDEPENDENT_AMBULATORY_CARE_PROVIDER_SITE_OTHER): Payer: Medicare Other | Admitting: Internal Medicine

## 2016-04-15 DIAGNOSIS — R0989 Other specified symptoms and signs involving the circulatory and respiratory systems: Secondary | ICD-10-CM

## 2016-04-15 DIAGNOSIS — I10 Essential (primary) hypertension: Secondary | ICD-10-CM | POA: Diagnosis not present

## 2016-04-15 NOTE — Progress Notes (Signed)
04/15/2016 [name] 06/17/1926 [id number]   HPI:  Dylan York is a 82 y.o. male patient of Dr Claiborne Billings, with a PMH below who presents today for hypertension clinic follow up.  I have seen him several times in the past, he keeps meticulous records of his home BP and will call when he notes that readings start to go up or he takes extra clonidine (he takes for systolic pressure > [id number]).  At his last visit, he was concerned about increase in clonidine (7 tabs in 3 months), but admitted to skipping his valsartan several different days because of low morning readings.  He was advised to not skip doses, as this would be more likely to cause swings in his BP.  He tends toward white coat hypertension, home readings usually in range, office readings commonly up to [id number] systolic.  At a previous visit we started him on chlorthalidone, but a BMET soon after, showed low potassium and sodium and it had to be discontinued.  There was little change in his home averages after stopping the medication.  Today he continues to worry about readings that range from 70-190/40-90.  In the past 3 weeks he took clonidine on 5 different days.  Cardiac Hx: atrial tachycardia, tricuspid regurgitation  Social Hx: no tobacco (quit in 40+ years ago), no alcohol  Diet: no added salt  Exercise: stays active at home, but no formal exercise  Home BP readings: continues to check 4-6 times per day; overall average 120/60 with high of 161/76 and low of 85/39.   Current antihypertensive medications: valsartan 320 mg qam, hydralazine 50 mg bid and 25 mg hs, doxazosin 4 mg qhs; clonidine 0.1 mg for systolic BP > 99991111  Intolerances: chlorthalidone (hypokalemia)   Wt Readings from Last 3 Encounters:  02/07/16 161 lb 6.4 oz (73.211 kg)  01/31/16 163 lb 3.2 oz (74.027 kg)  01/03/16 160 lb 4.8 oz (72.712 kg)   BP Readings from Last 3 Encounters:  02/07/16 162/78  01/31/16 200/118  01/03/16 218/96   Pulse Readings from  Last 3 Encounters:  02/07/16 53  01/01/16 60  09/24/15 51    Current Outpatient Prescriptions  Medication Sig Dispense Refill  . amiodarone (PACERONE) 200 MG tablet Take 1/2 tablet daily alternating with 1 tablet daily. 90 tablet 3  . apixaban (ELIQUIS) 2.5 MG TABS tablet Take 1 tablet (2.5 mg total) by mouth 2 (two) times daily. 60 tablet 9  . cloNIDine (CATAPRES) 0.1 MG tablet TAKE 1 TABLET BY MOUTH AS NEEDED FOR BLOOD PRESSURE GREATER THAN 190/100. NO MORE THAN 1 TABLET DAILY OR 3 TAB/WEEK 30 tablet 2  . COMBIGAN 0.2-0.5 % ophthalmic solution Place 1 drop into both eyes 2 (two) times daily.    Marland Kitchen doxazosin (CARDURA) 4 MG tablet TAKE 1 TABLET BY MOUTH EVERY DAY 30 tablet 10  . finasteride (PROSCAR) 5 MG tablet Take 5 mg by mouth daily.    . furosemide (LASIX) 20 MG tablet Take 20 mg by mouth daily as needed for fluid.    . hydrALAZINE (APRESOLINE) 50 MG tablet Take 1 tablet (50 mg total) by mouth every 8 (eight) hours. 270 tablet 3  . levothyroxine (SYNTHROID, LEVOTHROID) 50 MCG tablet Take 50 mcg by mouth daily.    Marland Kitchen LORazepam (ATIVAN) 1 MG tablet TAKE 1/2 TABLET OR 1 TABLET BY MOUTH AT BEDTIME AS NEEDED  2  . Multiple Vitamins-Minerals (MULTIVITAL PO) Take 1 tablet by mouth daily.     Marland Kitchen  omeprazole (PRILOSEC) 20 MG capsule Take 20 mg by mouth every other day.    . potassium chloride (K-DUR,KLOR-CON) 10 MEQ tablet Take 1 tablet by mouth daily as needed (along with furosemide).     . valsartan (DIOVAN) 320 MG tablet TAKE 1 TABLET BY MOUTH EVERY DAY 30 tablet 3   No current facility-administered medications for this visit.    Allergies  Allergen Reactions  . Multaq [Dronedarone] Shortness Of Breath    Past Medical History  Diagnosis Date  . Hypertension   . Tricuspid regurgitation   . GERD (gastroesophageal reflux disease)   . Atrial tachycardia ([place])   . Pneumonia   . BPH (benign prostatic hyperplasia)   . Sinus bradycardia, with HR in the 40s BB stopped. 08/08/2013  . Thyroid  disease     There were no vitals taken for this visit.    Tommy Medal PharmD CPP [facility]

## 2016-04-15 NOTE — Patient Instructions (Signed)
Return for a a follow up appointment with Dr. Claiborne Billings in July  Your blood pressure today is 196/88 (goal is < 150/90)   Check your blood pressure at home daily and keep record of the readings.  Take your BP meds as follows: continue with current medications.  Take clonidine 0.1 mg for any systolic BP > [id number] (readings from 6 pm to midnight only).  Do not take more than 1 tablet daily  Bring all of your meds, your BP cuff and your record of home blood pressures to your next appointment.  Exercise as you're able, try to walk approximately 30 minutes per day.  Keep salt intake to a minimum, especially watch canned and prepared boxed foods.  Eat more fresh fruits and vegetables and fewer canned items.  Avoid eating in fast food restaurants.    HOW TO TAKE YOUR BLOOD PRESSURE: . Rest 5 minutes before taking your blood pressure. .  Don't smoke or drink caffeinated beverages for at least 30 minutes before. . Take your blood pressure before (not after) you eat. . Sit comfortably with your back supported and both feet on the floor (don't cross your legs). . Elevate your arm to heart level on a table or a desk. . Use the proper sized cuff. It should fit smoothly and snugly around your bare upper arm. There should be enough room to slip a fingertip under the cuff. The bottom edge of the cuff should be 1 inch above the crease of the elbow. . Ideally, take 3 measurements at one sitting and record the average.

## 2016-04-16 DIAGNOSIS — M546 Pain in thoracic spine: Secondary | ICD-10-CM | POA: Diagnosis not present

## 2016-04-16 DIAGNOSIS — M9901 Segmental and somatic dysfunction of cervical region: Secondary | ICD-10-CM | POA: Diagnosis not present

## 2016-04-16 DIAGNOSIS — M47812 Spondylosis without myelopathy or radiculopathy, cervical region: Secondary | ICD-10-CM | POA: Diagnosis not present

## 2016-04-16 DIAGNOSIS — M9902 Segmental and somatic dysfunction of thoracic region: Secondary | ICD-10-CM | POA: Diagnosis not present

## 2016-04-16 DIAGNOSIS — M47816 Spondylosis without myelopathy or radiculopathy, lumbar region: Secondary | ICD-10-CM | POA: Diagnosis not present

## 2016-04-16 DIAGNOSIS — M9903 Segmental and somatic dysfunction of lumbar region: Secondary | ICD-10-CM | POA: Diagnosis not present

## 2016-04-16 NOTE — Assessment & Plan Note (Signed)
He continues to have labile hypertension.  We have worked with various times of day as well as different medications, but he continues to have varied readings.  I have encouraged him many times to not take his BP so often, stressing that twice daily is enough, but he continues with as many as 10 readings per day.  I will have him take the clonidine for any systolic pressures > 123XX123 in hopes of keeping him less than 200.  He will see Dr. Claiborne Billings in July for follow up.

## 2016-05-12 DIAGNOSIS — N419 Inflammatory disease of prostate, unspecified: Secondary | ICD-10-CM | POA: Diagnosis not present

## 2016-05-12 DIAGNOSIS — Z299 Encounter for prophylactic measures, unspecified: Secondary | ICD-10-CM | POA: Diagnosis not present

## 2016-05-13 ENCOUNTER — Other Ambulatory Visit: Payer: Self-pay | Admitting: Cardiovascular Disease

## 2016-05-13 DIAGNOSIS — N419 Inflammatory disease of prostate, unspecified: Secondary | ICD-10-CM | POA: Diagnosis not present

## 2016-05-14 DIAGNOSIS — M9902 Segmental and somatic dysfunction of thoracic region: Secondary | ICD-10-CM | POA: Diagnosis not present

## 2016-05-14 DIAGNOSIS — M47812 Spondylosis without myelopathy or radiculopathy, cervical region: Secondary | ICD-10-CM | POA: Diagnosis not present

## 2016-05-14 DIAGNOSIS — M47816 Spondylosis without myelopathy or radiculopathy, lumbar region: Secondary | ICD-10-CM | POA: Diagnosis not present

## 2016-05-14 DIAGNOSIS — M9903 Segmental and somatic dysfunction of lumbar region: Secondary | ICD-10-CM | POA: Diagnosis not present

## 2016-05-14 DIAGNOSIS — M9901 Segmental and somatic dysfunction of cervical region: Secondary | ICD-10-CM | POA: Diagnosis not present

## 2016-05-14 DIAGNOSIS — M546 Pain in thoracic spine: Secondary | ICD-10-CM | POA: Diagnosis not present

## 2016-05-16 ENCOUNTER — Ambulatory Visit (INDEPENDENT_AMBULATORY_CARE_PROVIDER_SITE_OTHER): Payer: Medicare Other | Admitting: Cardiovascular Disease

## 2016-05-16 ENCOUNTER — Encounter: Payer: Self-pay | Admitting: Cardiovascular Disease

## 2016-05-16 VITALS — BP 195/84 | HR 45 | Ht 68.0 in | Wt 156.4 lb

## 2016-05-16 DIAGNOSIS — E039 Hypothyroidism, unspecified: Secondary | ICD-10-CM | POA: Diagnosis not present

## 2016-05-16 DIAGNOSIS — I480 Paroxysmal atrial fibrillation: Secondary | ICD-10-CM

## 2016-05-16 DIAGNOSIS — R0989 Other specified symptoms and signs involving the circulatory and respiratory systems: Secondary | ICD-10-CM

## 2016-05-16 DIAGNOSIS — N281 Cyst of kidney, acquired: Secondary | ICD-10-CM

## 2016-05-16 DIAGNOSIS — Q6102 Congenital multiple renal cysts: Secondary | ICD-10-CM

## 2016-05-16 DIAGNOSIS — I10 Essential (primary) hypertension: Secondary | ICD-10-CM | POA: Diagnosis not present

## 2016-05-16 DIAGNOSIS — Z79899 Other long term (current) drug therapy: Secondary | ICD-10-CM | POA: Diagnosis not present

## 2016-05-16 DIAGNOSIS — Z7901 Long term (current) use of anticoagulants: Secondary | ICD-10-CM

## 2016-05-16 DIAGNOSIS — I495 Sick sinus syndrome: Secondary | ICD-10-CM

## 2016-05-16 DIAGNOSIS — I119 Hypertensive heart disease without heart failure: Secondary | ICD-10-CM

## 2016-05-16 MED ORDER — SPIRONOLACTONE 25 MG PO TABS: ORAL_TABLET | ORAL | Status: DC

## 2016-05-16 NOTE — Patient Instructions (Addendum)
Your physician recommends that you return for lab work fasting.  Your physician has recommended you make the following change in your medication:   1.) Start new prescription given today for spironolactone as directed on the bottle.  2.) STOP furosemide.  Your physician recommends that you schedule a follow-up appointment in: 3 months.

## 2016-05-18 ENCOUNTER — Encounter: Payer: Self-pay | Admitting: Cardiovascular Disease

## 2016-05-18 NOTE — Progress Notes (Signed)
Patient ID: ABHIJAY MORRISS, male   DOB: Feb 16, 1926, 82 y.o.   MRN: [id number]     HPI: TREVIONE WERT is a 82 y.o. male who presents to the office for 3 month  followup cardiology evaluation.  Mr. Mckinney has a history of sick sinus syndrome with atrial tachycardia as well as sinus bradycardia, ventricular bigeminy, PVCs, PAF and difficult to control hypertension  He had stabilized with Bystolic but when he was seen on 03/08/2013 he was in atrial fibrillation of questionable duration. At that time I increased his Bystolic and his started anticoagulation with Eliquis 2.5 mg twice a day. An echo Doppler study on 03/30/2013 revealed normal systolic function with an ejection fraction of 60-65%, aortic valve sclerosis, biatrial enlargement, as well as mild pulmonary hypertension with a PA pressure of 41 mm. On 04/04/2013 AF 77 rate was beats per minute. He was initially started  on Multaq 400 mg twice a day but did not tolerate this and ultimately discontinue its use.  When  l saw him on 04/26/2013 I started him on amiodarone 200 mg daily. He has tolerated this well. 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  On 05/27/2013 I further titrated his amiodarone to 200 mg twice a day. I also recommended that he reduce his Bystolic from 10 to 5 mg with a plan to do a cardioversion but as part of his preoperative assessment he was found to have pneumonia. His  pneumonia was treated with antibiotic therapy by Dr. Woody Seller in [place]. On 08/05/2013 he was seen in the office as an add-on at which time he had significant accelerated hypertension with a blood pressure of 220/100. He was seen by [name] and admitted to [facility] hospital. He  was treated with IV Cardene and he converted to sinus rhythm. Ultimately nicardipine was discontinued and amlodipine was started as well as hydralazine. He was discharged home on 08/09/2013.  His heart rhythm has been fairly stable. He has noticed some mild ankle swelling right greater than left. Lower extremityDoppler studies were negative for DVT. On 08/23/2013  amlodipine was reduced from 10 to7.5 mg and I reduced his amiodarone from 400 mg to 300 mg. Subsequently, he now is on a further reduce dose of amlodipine at just 2.5 mg daily. He does note continued increasing lower extremity edema, right leg greater than left. He has not been taking his furosemide  20 mg regularly.  On 10/31/2013 I discontinued his amlodipine and recommended furosemide 20 mg daily due to his significant  pretibial and ankle edema. We also discussed sodium restriction. Since that time he has been using 20 -30 mm  support stockings below the knee with marked benefit.  A renal duplex scan in February 2015 demonstrated right renal artery narrowing proximally in the range of 1-59% with velocities at 213, suggesting the upper end of the range.  His left renal artery had normal pain. He  He has numerous large anechoic cysts in both kidneys with the largest measuring 8.9 x 7.6 x 8.0.  He is also followed by [name] urologically.  On 01/12/2015.  He underwent a follow-up renal duplex scan.  He again had mild bilateral renal artery narrowing at the right renal artery.  Increased velocity proximally at 185 in the left renal artery.  A 206 consistent with a 159% range diameter reduction.  The left kidney again showed numerous  an echo.  It cysts.  The 2 largest measured 7.08.3 x a 8.0 cm and 005.005.005.005 cm.  Left kidney could not accurately be measured in length.  Due to large cyst within the lower pole of the kidney.  Mr. Corter has had problems with significant blood pressure lability.  When I last saw him he felt that  had significant blood pressure had somewhat stabilized.  At that time he was taking Cardura 4 mg daily, hydralazine 50 mg twice a day , and an another dose at 25 mg, furosemide 20 mg daily, valsartan 320 mg.   He also is on levothyroxine 50 g for hypothyroidism.  He takes clonidine 0.1 mg if his blood pressure gets above 568 systolically.  He is also continuing to take amiodarone at 100 mg alternating with 200 mg and denies recurrent AF.  He is anticoagulated with eliquis.  He is very analytical and over the past 6 months he states that he has taken 607 blood pressure readings.  32 readings or 5% revealed blood pressure elevation 127 systolically for which he is took clonidine 5 times or 0.8%.    Since I last saw him, he has seen Kristen on several occasions with significant blood pressure lability with blood pressure readings in excess of 517 systolically and diastolically over 90.  He also states at some times  his blood pressure may get low.  She had tried him on chlorthalidone, but a subsequent be met showed low potassium and sodium and it was discontinued.  At times he has noticed a sensation where he may have had transient atrial fibrillation.   When I last saw him, I tried to simplify his medications and recently he has been taking hydralazine 50 mg every 8 hours, valsartan 320 mg in the morning, Cardura 4 mg, amiodarone 100 mg and if he notes his blood pressure getting in excess of 200 he takes  0.1 mg clonidine PRN. At that time, it sounded like he may have had an episode of breakthrough atrial fibrillation and I slightly adjusted his amiodarone to 100 mg alternating with 200 mg every other day.  He brought with him his blood pressure data over the last several months including means and averages.  On average, his blood pressure seems to be running in the 140-150 range, but there have been instances where his blood pressure is in excess of 200.  He is unaware of any recurrent atrial fibrillation.  He denies chest pain.  He presents for evaluation.  Allergies  Allergen Reactions  . Multaq [Dronedarone] Shortness Of Breath    Current Outpatient Prescriptions  Medication Sig Dispense Refill  .  amiodarone (PACERONE) 200 MG tablet Take 1/2 tablet daily alternating with 1 tablet daily. 90 tablet 3  . apixaban (ELIQUIS) 2.5 MG TABS tablet Take 1 tablet (2.5 mg total) by mouth 2 (two) times daily. 60 tablet 9  . cloNIDine (CATAPRES) 0.1 MG tablet TAKE 1 TABLET BY MOUTH AS NEEDED FOR BLOOD PRESSURE GREATER THAN 190/100. NO MORE THAN 1 TABLET DAILY OR 3 TAB/WEEK 30 tablet 2  . COMBIGAN 0.2-0.5 % ophthalmic solution Place 1 drop into both eyes 2 (two) times daily.    Marland Kitchen doxazosin (CARDURA) 4 MG tablet TAKE 1 TABLET BY MOUTH EVERY DAY 30 tablet 10  . finasteride (PROSCAR) 5 MG tablet Take 5 mg by mouth daily.    . hydrALAZINE (APRESOLINE) 50 MG tablet Take 1 tablet (50 mg total) by mouth every 8 (eight) hours. 270 tablet 3  .  levothyroxine (SYNTHROID, LEVOTHROID) 50 MCG tablet Take 50 mcg by mouth daily.    Marland Kitchen LORazepam (ATIVAN) 1 MG tablet TAKE 1/2 TABLET OR 1 TABLET BY MOUTH AT BEDTIME AS NEEDED  2  . omeprazole (PRILOSEC) 20 MG capsule Take 20 mg by mouth every other day.    . potassium chloride (K-DUR,KLOR-CON) 10 MEQ tablet Take 1 tablet by mouth daily as needed (along with furosemide).     . valsartan (DIOVAN) 320 MG tablet TAKE 1 TABLET BY MOUTH EVERY DAY 30 tablet 3  . spironolactone (ALDACTONE) 25 MG tablet Take 1/2 tablet daily for for one week. Then increase to 1/2 tablet twice a day. 30 tablet 6   No current facility-administered medications for this visit.    Socially he is married and has 3 children he quit tobacco in 1978. 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  ROS General: Negative; No fevers, chills, or night sweats;  HEENT: Positive for history of glaucoma No changes in vision or hearing, sinus congestion, difficulty swallowing Pulmonary: Negative; No cough, wheezing, shortness of breath, hemoptysis Cardiovascular: Negative; No chest pain, presyncope, syncope, palpitations Positive for intermittent leg swelling GI: Negative;  No nausea, vomiting, diarrhea, or abdominal pain GU: Positive for renal cysts; No dysuria, hematuria, or difficulty voiding Musculoskeletal: Negative; no myalgias, joint pain, or weakness Hematologic/Oncology: Negative; no easy bruising, bleeding Endocrine: Negative; no heat/cold intolerance; no diabetes Neuro: Negative; no changes in balance, headaches Skin: Negative; No rashes or skin lesions Psychiatric: Negative; No behavioral problems, depression Sleep: Negative; No snoring, daytime sleepiness, hypersomnolence, bruxism, restless legs, hypnogognic hallucinations, no cataplexy Other comprehensive 14 point system review is negative.    PE BP 195/84 mmHg  Pulse 45  Ht '5\' 8"'$  (1.727 m)  Wt 156 lb 6.4 oz (70.943 kg)  BMI 23.79 kg/m2   Repeat blood pressure by me was 198/85 without orthostatic change  Wt Readings from Last 3 Encounters:  05/16/16 156 lb 6.4 oz (70.943 kg)  02/07/16 161 lb 6.4 oz (73.211 kg)  01/31/16 163 lb 3.2 oz (74.027 kg)   General: Alert, oriented, no distress.  Skin: normal turgor, no rashes HEENT: Normocephalic, atraumatic. Pupils round and reactive; sclera anicteric;no lid lag.  Nose without nasal septal hypertrophy Mouth/Parynx benign; Mallinpatti scale 3 Neck: No JVD, no carotid bruits with normal carotid upstroke. Chest wall: No tenderness to palpate Lungs: clear to ausculatation and percussion; no wheezing or rales Heart:  Regular rhythm but bradycardic with a ventricular rate in the mid 50s; no s3. 1/6 sem; no diastolic murmur. No rubs thrills or heaves. Abdomen: soft, nontender; no hepatosplenomehaly, BS+; abdominal aorta nontender and not dilated by palpation. No definitive renal artery bruit Back: No CVA tenderness Pulses 2+ Extremities: Trace edema above the sock line bilaterally,  ;no clubbing cyanosis, Homan's sign negative  Neurologic: grossly nonfocal; cranial nerves grossly normal Psychologic: Normal affect and mood  ECG (independently read  by me): Sinus rhythm but bradycardic in the 40s with sinus arrhythmia.  Nonspecific ST changes.  April 2017 ECG (independently read by me): August bradycardia at 53 bpm.  Sinus arrhythmia.  Nonspecific ST changes.  09/24/2015 ECG (independently read by me): Sinus bradycardia, first-degree AV block.  Ventricular rate 51.  PR interval 218 ms.  Nondiagnostic ST changes  May 2016 ECG (independently read by me): Sinus bradycardia 52 bpm.  PR interval 198 ms, QTc interval 451 ms.  Nonspecific interventricular conduction delay.  February 2016 ECG (independently  read by me, and (: Sinus bradycardia 48 bpm.  Nonspecific conduction delay.  No significant ST abnormalities.  QTc interval 434 ms.  November 2015 ECG (independently read by me):  Sinus bradycardia 52 bpm.  PR interval 216 ms.  No ectopy.  July 2015ECG (independently read by me): Sinus bradycardia with first degree AV block.  PR interval 240 ms.  Nonspecific ST changes.  PAC.  Prior 01/10/2014 ECG (independently read by me) sinus bradycardia 53 beats per minute. QTc interval 457 ms. No ectopy.  Prior 12/12/2013 ECG  (independently read by me): Sinus rhythm at 56 beats per minute. PR interval 206 ms. QTc interval 470 ms   Prior ECG of 10/31/2013: Normal sinus rhythm at 61 beats per minute. Isolated  PVC.  Non-specific intra-ventricular conduction delay. Nonspecific ST-T changes. QTc interval 461 ms.  LABS:  BMP Latest Ref Rng 01/31/2016 08/06/2013 08/05/2013  Glucose 65 - 99 mg/dL 96 97 132(H)  BUN 7 - 25 mg/dL '16 15 16  '$ Creatinine 0.70 - 1.11 mg/dL 1.00 0.96 1.04  Sodium 135 - 146 mmol/L 138 133(L) 135  Potassium 3.5 - 5.3 mmol/L 4.3 3.6 3.2(L)  Chloride 98 - 110 mmol/L 104 99 99  CO2 20 - 31 mmol/L '22 23 22  '$ Calcium 8.6 - 10.3 mg/dL 9.0 8.3(L) 8.4    Hepatic Function Latest Ref Rng 08/05/2013 07/21/2013  Total Protein 6.0 - 8.3 g/dL 6.0 6.3  Albumin 3.5 - 5.2 g/dL 2.9(L) 3.9  AST 0 - 37 U/L 28 24  ALT 0 - 53 U/L 33 21  Alk  Phosphatase 39 - 117 U/L 74 66  Total Bilirubin 0.3 - 1.2 mg/dL 1.0 1.9(H)    CBC Latest Ref Rng 08/06/2013 08/05/2013 07/21/2013  WBC 4.0 - 10.5 K/uL 6.6 7.3 8.5  Hemoglobin 13.0 - 17.0 g/dL 13.3 13.1 14.4  Hematocrit 39.0 - 52.0 % 36.9(L) 37.3(L) 41.8  Platelets 150 - 400 K/uL 229 231 167   Lab Results  Component Value Date   MCV 90.0 08/06/2013   MCV 90.5 08/05/2013   MCV 93.7 07/21/2013   Lab Results  Component Value Date   TSH 3.520 08/05/2013   No results found for: HGBA1C   Lipid Panel     Component Value Date/Time   CHOL 162 04/06/2013 0850   TRIG 84 04/06/2013 0850   HDL 53 04/06/2013 0850   CHOLHDL 3.1 04/06/2013 0850   VLDL 17 04/06/2013 0850   LDLCALC 92 04/06/2013 0850      RADIOLOGY: No results found.    ASSESSMENT AND PLAN: Mr. Riel is an 82 year old white male who has a history of PAF and had been maintaining normal sinus rhythm on amiodarone to 100 grams daily.  He continues to have significant blood pressure lability.  He did not tolerate chlorthalidone due to low potassium and low sodium.  When I last saw him, I changed his hydralazine to 50 g every 8 hours  and due to concerns of possible PAF, increased amiodarone to 100 mg alternating with 200 mg every other day.  He believes his heart rhythm is more stable.  Reviewed his blood pressure data that he brought with him to the office today.  He has been taking furosemide on a when necessary basis and I have suggested he discontinue this.  With his blood pressure lability believe he may benefit from low-dose aldosterone blockade and for this reason, am starting him on 12.5 mg daily and will increase this to 12.5 mg twice a day.  If  blood pressure remains labile.  Blood work will be obtained including a competent metabolic panel, CBC, magnesium level, TSH, and lipid studies.  He continues to be on eliquis for anticoagulation and denies bleeding.  On amiodarone his LFTs and thyroid will be monitored. He has  documented renal cysts on his renal duplex study which have appeared stable and is followed by [name] I will see him in 3 months for reevaluation.   Time spent: 25 minutes  Troy Sine, MD, [facility]  05/18/2016 4:27 PM

## 2016-06-03 ENCOUNTER — Ambulatory Visit (INDEPENDENT_AMBULATORY_CARE_PROVIDER_SITE_OTHER): Payer: Medicare Other | Admitting: Internal Medicine

## 2016-06-03 ENCOUNTER — Encounter (INDEPENDENT_AMBULATORY_CARE_PROVIDER_SITE_OTHER): Payer: Self-pay | Admitting: Internal Medicine

## 2016-06-03 VITALS — BP 150/80 | HR 62 | Temp 97.6°F | Resp 18 | Ht 68.0 in | Wt 153.8 lb

## 2016-06-03 DIAGNOSIS — D649 Anemia, unspecified: Secondary | ICD-10-CM

## 2016-06-03 DIAGNOSIS — K6389 Other specified diseases of intestine: Secondary | ICD-10-CM

## 2016-06-03 NOTE — Progress Notes (Signed)
Presenting complaint;  For evaluation of anemia.  Subjective:  Patient is 82 year old Caucasian male who was last seen in February 2017 for intractable bloating and early satiety. He underwent hydrogen breath test and confirmed to have small intestinal bacterial overgrowth. He was treated with metronidazole with complete resolution of his symptoms. Back in December he was noted to be anemic with hemoglobin of 11.5 g. In the past few years his hemoglobin has been 13 or higher. He did submit a stool specimen at PCPs office but apparently was lost. He denies melena or rectal bleeding or hematuria. He is not having bloating or early satiety anymore. He has good appetite. He has lost 6 pounds since his last visit. He says he is not trying to lose weight but he is happy that he has. His bowels move every 2-3 days. He uses stool softeners on as-needed basis. He has noted discomfort in right inguinal region as well as involving his own eyes. He has not had fever or chills. He was evaluated by Mrs. Nicanor Bake, FNP and felt not to have prostatitis. Patient states he is scheduled to have blood work in the next few days as requested by Dr. Claiborne Billings his cardiologist. Patient's last colonoscopy was 13 years ago.   Current Medications: Outpatient Encounter Prescriptions as of 06/03/2016  Medication Sig  . amiodarone (PACERONE) 200 MG tablet Take 1/2 tablet daily alternating with 1 tablet daily.  Marland Kitchen apixaban (ELIQUIS) 2.5 MG TABS tablet Take 1 tablet (2.5 mg total) by mouth 2 (two) times daily.  . COMBIGAN 0.2-0.5 % ophthalmic solution Place 1 drop into both eyes 2 (two) times daily.  Marland Kitchen doxazosin (CARDURA) 4 MG tablet TAKE 1 TABLET BY MOUTH EVERY DAY  . finasteride (PROSCAR) 5 MG tablet Take 5 mg by mouth daily.  . hydrALAZINE (APRESOLINE) 50 MG tablet Take 1 tablet (50 mg total) by mouth every 8 (eight) hours.  Marland Kitchen levothyroxine (SYNTHROID, LEVOTHROID) 50 MCG tablet Take 50 mcg by mouth daily.  Marland Kitchen LORazepam  (ATIVAN) 1 MG tablet TAKE 1/2 TABLET OR 1 TABLET BY MOUTH AT BEDTIME AS NEEDED  . spironolactone (ALDACTONE) 25 MG tablet Take 1/2 tablet daily for for one week. Then increase to 1/2 tablet twice a day.  . valsartan (DIOVAN) 320 MG tablet TAKE 1 TABLET BY MOUTH EVERY DAY  . [DISCONTINUED] potassium chloride (K-DUR,KLOR-CON) 10 MEQ tablet Take 1 tablet by mouth daily as needed (along with furosemide).   . [DISCONTINUED] cloNIDine (CATAPRES) 0.1 MG tablet TAKE 1 TABLET BY MOUTH AS NEEDED FOR BLOOD PRESSURE GREATER THAN 190/100. NO MORE THAN 1 TABLET DAILY OR 3 TAB/WEEK (Patient not taking: Reported on 06/03/2016)  . [DISCONTINUED] omeprazole (PRILOSEC) 20 MG capsule Take 20 mg by mouth every other day.   No facility-administered encounter medications on file as of 06/03/2016.      Objective: Blood pressure (!) 150/80, pulse 62, temperature 97.6 F (36.4 C), temperature source Oral, resp. rate 18, height 5\' 8"  (1.727 m), weight 153 lb 12.8 oz (69.8 kg). Patient is alert and in no acute distress. Conjunctiva is pink. Sclera is nonicteric Oropharyngeal mucosa is normal. No neck masses or thyromegaly noted. Cardiac exam with regular rhythm normal S1 and S2. No murmur or gallop noted. Lungs are clear to auscultation. AbdomenIs symmetrical. Bowel sounds are normal. On palpation abdomen is soft and nontender without organomegaly or masses. Rectal examination reveals firm but nonnodular prostate. External genitalia within normal limits No inguinal adenopathy noted. No LE edema or clubbing noted.  Labs/studies  Results: H&H was 11.5 and 32.8 on 10/15/2015   H&H was 12 and 36.3 on 05/13/2016 when MCV was 97 and platelet count was 166K and WBC 4.2.    Assessment:  #1. Mild anemia. His stool is guaiac negative today. He has no history of melena or rectal bleeding. His hemoglobin level actually has improved from 11.5 in December 2016 to 12 g three weeks ago. I wonder if this level is normal given his  age. Given he has history of intestinal bacterial overgrowth will rule out iron and B12 or folate deficiency. This is highly unlikely since this condition is in remission. #2. History of small intestinal bacterial overgrowth. His symptoms have not relapses he was treated with metronidazole but 4 months ago. Need to retreat will depend on his symptoms.   Plan:  Patient will have serum iron TIBC ferritin B12 and folate levels with rest of his blood work next week. Patient advised to call office if he has melena or rectal bleeding. Further recommendations to follow.

## 2016-06-03 NOTE — Patient Instructions (Signed)
Physician will call with results of blood tests when completed. Can take Colace 100 mg by mouth 3 times a week or every other day.

## 2016-06-05 DIAGNOSIS — I10 Essential (primary) hypertension: Secondary | ICD-10-CM | POA: Diagnosis not present

## 2016-06-05 DIAGNOSIS — Z79899 Other long term (current) drug therapy: Secondary | ICD-10-CM | POA: Diagnosis not present

## 2016-06-05 DIAGNOSIS — K6389 Other specified diseases of intestine: Secondary | ICD-10-CM | POA: Diagnosis not present

## 2016-06-05 DIAGNOSIS — E039 Hypothyroidism, unspecified: Secondary | ICD-10-CM | POA: Diagnosis not present

## 2016-06-05 DIAGNOSIS — I480 Paroxysmal atrial fibrillation: Secondary | ICD-10-CM | POA: Diagnosis not present

## 2016-06-05 DIAGNOSIS — D649 Anemia, unspecified: Secondary | ICD-10-CM | POA: Diagnosis not present

## 2016-06-06 LAB — COMPREHENSIVE METABOLIC PANEL
A/G RATIO: 2 (ref 1.2–2.2)
ALK PHOS: 58 IU/L (ref 39–117)
ALT: 16 IU/L (ref 0–44)
AST: 25 IU/L (ref 0–40)
Albumin: 3.9 g/dL (ref 3.5–4.7)
BUN/Creatinine Ratio: 20 (ref 10–24)
BUN: 25 mg/dL (ref 8–27)
Bilirubin Total: 1.1 mg/dL (ref 0.0–1.2)
CALCIUM: 8.7 mg/dL (ref 8.6–10.2)
CHLORIDE: 104 mmol/L (ref 96–106)
CO2: 22 mmol/L (ref 18–29)
Creatinine, Ser: 1.28 mg/dL — ABNORMAL HIGH (ref 0.76–1.27)
GFR calc Af Amer: 58 mL/min/{1.73_m2} — ABNORMAL LOW (ref >59)
GFR calc non Af Amer: 50 mL/min/{1.73_m2} — ABNORMAL LOW (ref >59)
GLOBULIN, TOTAL: 2 g/dL (ref 1.5–4.5)
Glucose: 96 mg/dL (ref 65–99)
POTASSIUM: 4.3 mmol/L (ref 3.5–5.2)
SODIUM: 140 mmol/L (ref 134–144)
Total Protein: 5.9 g/dL — ABNORMAL LOW (ref 6.0–8.5)

## 2016-06-06 LAB — LIPID PANEL
CHOL/HDL RATIO: 2.4 ratio (ref 0.0–5.0)
CHOLESTEROL TOTAL: 161 mg/dL (ref 100–199)
HDL: 68 mg/dL (ref >39)
LDL Calculated: 77 mg/dL (ref 0–99)
TRIGLYCERIDES: 78 mg/dL (ref 0–149)
VLDL Cholesterol Cal: 16 mg/dL (ref 5–40)

## 2016-06-06 LAB — CBC
HEMATOCRIT: 37.1 % — AB (ref 37.5–51.0)
Hemoglobin: 12.2 g/dL — ABNORMAL LOW (ref 12.6–17.7)
MCH: 32.4 pg (ref 26.6–33.0)
MCHC: 32.9 g/dL (ref 31.5–35.7)
MCV: 98 fL — AB (ref 79–97)
PLATELETS: 167 10*3/uL (ref 150–379)
RBC: 3.77 x10E6/uL — AB (ref 4.14–5.80)
RDW: 13.3 % (ref 12.3–15.4)
WBC: 3.9 10*3/uL (ref 3.4–10.8)

## 2016-06-06 LAB — MAGNESIUM: Magnesium: 2.1 mg/dL (ref 1.6–2.3)

## 2016-06-06 LAB — TSH: TSH: 4.14 u[IU]/mL (ref 0.450–4.500)

## 2016-06-10 ENCOUNTER — Encounter (INDEPENDENT_AMBULATORY_CARE_PROVIDER_SITE_OTHER): Payer: Self-pay | Admitting: Internal Medicine

## 2016-06-10 ENCOUNTER — Telehealth (INDEPENDENT_AMBULATORY_CARE_PROVIDER_SITE_OTHER): Payer: Self-pay | Admitting: *Deleted

## 2016-06-10 ENCOUNTER — Telehealth (INDEPENDENT_AMBULATORY_CARE_PROVIDER_SITE_OTHER): Payer: Self-pay | Admitting: Internal Medicine

## 2016-06-10 NOTE — Telephone Encounter (Signed)
   Diagnosis:    Result(s)   Card 1: Negative:     Card 2:  Negative:      Completed by:    HEMOCCULT SENSA DEVELOPER: LOT#:[id number]   EXPIRATION DATE: 9-17   HEMOCCULT SENSA CARD:  LOT#:[id number]   EXPIRATION DATE: 07/18   CARD CONTROL RESULTS:  POSITIVE:Postive  NEGATIVE: Negative    ADDITIONAL COMMENTS: Dr>Rehman was made aware.

## 2016-06-10 NOTE — Telephone Encounter (Signed)
Hemoccult 2 is negative. Hemoglobin is 12.2. Iron studies B12 and folate levels within normal limits(cannot find the paper report). All results reviewed with patient. Will do H&H in 3 months and weight check in 2-3 months.

## 2016-06-11 ENCOUNTER — Telehealth: Payer: Self-pay | Admitting: Cardiovascular Disease

## 2016-06-11 ENCOUNTER — Other Ambulatory Visit (INDEPENDENT_AMBULATORY_CARE_PROVIDER_SITE_OTHER): Payer: Self-pay | Admitting: *Deleted

## 2016-06-11 DIAGNOSIS — M9903 Segmental and somatic dysfunction of lumbar region: Secondary | ICD-10-CM | POA: Diagnosis not present

## 2016-06-11 DIAGNOSIS — M47816 Spondylosis without myelopathy or radiculopathy, lumbar region: Secondary | ICD-10-CM | POA: Diagnosis not present

## 2016-06-11 DIAGNOSIS — M9901 Segmental and somatic dysfunction of cervical region: Secondary | ICD-10-CM | POA: Diagnosis not present

## 2016-06-11 DIAGNOSIS — D649 Anemia, unspecified: Secondary | ICD-10-CM

## 2016-06-11 DIAGNOSIS — M546 Pain in thoracic spine: Secondary | ICD-10-CM | POA: Diagnosis not present

## 2016-06-11 DIAGNOSIS — M9902 Segmental and somatic dysfunction of thoracic region: Secondary | ICD-10-CM | POA: Diagnosis not present

## 2016-06-11 DIAGNOSIS — M47812 Spondylosis without myelopathy or radiculopathy, cervical region: Secondary | ICD-10-CM | POA: Diagnosis not present

## 2016-06-11 NOTE — Telephone Encounter (Signed)
H&H noted for 09/11/2016. A letter will be sent as a reminder.

## 2016-06-11 NOTE — Telephone Encounter (Signed)
Received call from [place] lab results from 8/3.  Pt made aware MD Claiborne Billings has not reviewed labs at this time but once he reviews lab work someone from the office will call him with the results and/or recommendations.    Pt verbalized understanding.

## 2016-06-11 NOTE — Telephone Encounter (Signed)
New Message  Pt calling to receive lab results from last weeks appt.  Please follow up

## 2016-06-11 NOTE — Telephone Encounter (Signed)
Attempted to call patient back- no answer on mobile, started leaving message on home answering machine and call was disconnected.

## 2016-06-20 ENCOUNTER — Encounter (INDEPENDENT_AMBULATORY_CARE_PROVIDER_SITE_OTHER): Payer: Self-pay

## 2016-06-23 ENCOUNTER — Telehealth (INDEPENDENT_AMBULATORY_CARE_PROVIDER_SITE_OTHER): Payer: Self-pay | Admitting: *Deleted

## 2016-06-23 DIAGNOSIS — H401121 Primary open-angle glaucoma, left eye, mild stage: Secondary | ICD-10-CM | POA: Diagnosis not present

## 2016-06-23 DIAGNOSIS — H40021 Open angle with borderline findings, high risk, right eye: Secondary | ICD-10-CM | POA: Diagnosis not present

## 2016-06-23 DIAGNOSIS — Z961 Presence of intraocular lens: Secondary | ICD-10-CM | POA: Diagnosis not present

## 2016-06-23 NOTE — Telephone Encounter (Signed)
He could have lost some weight second to spironolactone(diuretic effect). Will recheck weight in 2 months.

## 2016-06-23 NOTE — Telephone Encounter (Signed)
Patient weighed 153 lbs 12.8 oz at his last OC. Today he weighed in at 154.0lbs. He questions if the Spironolactone may be causing some of this.

## 2016-06-24 NOTE — Telephone Encounter (Signed)
Patient was called and give Dr.Rehman's recommendation. He will return in 2 months for his weight check.

## 2016-06-30 DIAGNOSIS — N281 Cyst of kidney, acquired: Secondary | ICD-10-CM | POA: Diagnosis not present

## 2016-06-30 DIAGNOSIS — N401 Enlarged prostate with lower urinary tract symptoms: Secondary | ICD-10-CM | POA: Diagnosis not present

## 2016-06-30 DIAGNOSIS — N411 Chronic prostatitis: Secondary | ICD-10-CM | POA: Diagnosis not present

## 2016-07-09 DIAGNOSIS — M9901 Segmental and somatic dysfunction of cervical region: Secondary | ICD-10-CM | POA: Diagnosis not present

## 2016-07-09 DIAGNOSIS — M9903 Segmental and somatic dysfunction of lumbar region: Secondary | ICD-10-CM | POA: Diagnosis not present

## 2016-07-09 DIAGNOSIS — M546 Pain in thoracic spine: Secondary | ICD-10-CM | POA: Diagnosis not present

## 2016-07-09 DIAGNOSIS — M47816 Spondylosis without myelopathy or radiculopathy, lumbar region: Secondary | ICD-10-CM | POA: Diagnosis not present

## 2016-07-09 DIAGNOSIS — M47812 Spondylosis without myelopathy or radiculopathy, cervical region: Secondary | ICD-10-CM | POA: Diagnosis not present

## 2016-07-09 DIAGNOSIS — M9902 Segmental and somatic dysfunction of thoracic region: Secondary | ICD-10-CM | POA: Diagnosis not present

## 2016-07-11 ENCOUNTER — Telehealth: Payer: Self-pay | Admitting: Pharmacist Clinician (PhC)/ Clinical Pharmacy Specialist

## 2016-07-11 NOTE — Telephone Encounter (Signed)
Patient mailed listing of BP readings since starting spironolactone 12.5 mg bid.  Has only had one reading > [id number] systolic since starting on this dose.  Patient reports feeling fine, potassium level stable.  Patient stopped valsartan dose about 3-4 weeks ago.  Doing well without.  Continue as is.

## 2016-08-11 ENCOUNTER — Other Ambulatory Visit: Payer: Self-pay | Admitting: Cardiovascular Disease

## 2016-08-12 ENCOUNTER — Telehealth: Payer: Self-pay | Admitting: Cardiovascular Disease

## 2016-08-12 NOTE — Telephone Encounter (Signed)
Patient calling the office for samples of medication:   1.  What medication and dosage are you requesting samples for? Eliquis 2.5  2.  Are you currently out of this medication? no

## 2016-08-13 DIAGNOSIS — M9903 Segmental and somatic dysfunction of lumbar region: Secondary | ICD-10-CM | POA: Diagnosis not present

## 2016-08-13 DIAGNOSIS — M47812 Spondylosis without myelopathy or radiculopathy, cervical region: Secondary | ICD-10-CM | POA: Diagnosis not present

## 2016-08-13 DIAGNOSIS — M9901 Segmental and somatic dysfunction of cervical region: Secondary | ICD-10-CM | POA: Diagnosis not present

## 2016-08-13 DIAGNOSIS — M546 Pain in thoracic spine: Secondary | ICD-10-CM | POA: Diagnosis not present

## 2016-08-13 DIAGNOSIS — M9902 Segmental and somatic dysfunction of thoracic region: Secondary | ICD-10-CM | POA: Diagnosis not present

## 2016-08-13 DIAGNOSIS — M47816 Spondylosis without myelopathy or radiculopathy, lumbar region: Secondary | ICD-10-CM | POA: Diagnosis not present

## 2016-08-15 NOTE — Telephone Encounter (Signed)
Samples at the front desk Pt notified 

## 2016-08-18 ENCOUNTER — Encounter: Payer: Self-pay | Admitting: Cardiovascular Disease

## 2016-08-18 ENCOUNTER — Ambulatory Visit (INDEPENDENT_AMBULATORY_CARE_PROVIDER_SITE_OTHER): Payer: Medicare Other | Admitting: Cardiovascular Disease

## 2016-08-18 VITALS — BP 124/50 | HR 43 | Ht 68.0 in | Wt 154.2 lb

## 2016-08-18 DIAGNOSIS — I10 Essential (primary) hypertension: Secondary | ICD-10-CM | POA: Diagnosis not present

## 2016-08-18 DIAGNOSIS — E039 Hypothyroidism, unspecified: Secondary | ICD-10-CM

## 2016-08-18 DIAGNOSIS — I119 Hypertensive heart disease without heart failure: Secondary | ICD-10-CM | POA: Diagnosis not present

## 2016-08-18 DIAGNOSIS — I495 Sick sinus syndrome: Secondary | ICD-10-CM

## 2016-08-18 DIAGNOSIS — N281 Cyst of kidney, acquired: Secondary | ICD-10-CM

## 2016-08-18 DIAGNOSIS — I480 Paroxysmal atrial fibrillation: Secondary | ICD-10-CM

## 2016-08-18 DIAGNOSIS — Z7901 Long term (current) use of anticoagulants: Secondary | ICD-10-CM

## 2016-08-18 DIAGNOSIS — R0989 Other specified symptoms and signs involving the circulatory and respiratory systems: Secondary | ICD-10-CM

## 2016-08-18 MED ORDER — AMIODARONE HCL 200 MG PO TABS: 100.0000 mg | ORAL_TABLET | Freq: Every day | ORAL | 11 refills | Status: DC

## 2016-08-18 NOTE — Patient Instructions (Signed)
Your physician has recommended you make the following change in your medication:   1.) the amiodarone has been decreased to 100 mg daily. ( 1/2 tablet)  Your physician wants you to follow-up in: 6 months or sooner if needed. You will receive a reminder letter in the mail two months in advance. If you don't receive a letter, please call our office to schedule the follow-up appointment.  If you need a refill on your cardiac medications before your next appointment, please call your pharmacy.

## 2016-08-19 NOTE — Progress Notes (Signed)
Patient ID: Dylan York, male   DOB: 11-15-1925, 82 y.o.   MRN: [id number]     HPI: Dylan York is a 82 y.o. male who presents to the office for 3 month  followup cardiology evaluation.  Dylan York has a history of sick sinus syndrome with atrial tachycardia as well as sinus bradycardia, ventricular bigeminy, PVCs, PAF and difficult to control hypertension  He had stabilized with Bystolic but when he was seen on 03/08/2013 he was in atrial fibrillation of questionable duration. At that time I increased his Bystolic and his started anticoagulation with Eliquis 2.5 mg twice a day. An echo Doppler study on 03/30/2013 revealed normal systolic function with an ejection fraction of 60-65%, aortic valve sclerosis, biatrial enlargement, as well as mild pulmonary hypertension with a PA pressure of 41 mm. On 04/04/2013 AF 77 rate was beats per minute. He was initially started  on Multaq 400 mg twice a day but did not tolerate this and ultimately discontinue its use.  When  l saw him on 04/26/2013 I started him on amiodarone 200 mg daily. He has tolerated this well. 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  On 05/27/2013 I further titrated his amiodarone to 200 mg twice a day. I also recommended that he reduce his Bystolic from 10 to 5 mg with a plan to do a cardioversion but as part of his preoperative assessment he was found to have pneumonia. His  pneumonia was treated with antibiotic therapy by Dr. Sherril Croon in [place]. On 08/05/2013 he was seen in the office as an add-on at which time he had significant accelerated hypertension with a blood pressure of 220/100. He was seen by Wilburt Finlay and admitted to [facility] hospital. He  was treated with IV Cardene and he converted to sinus rhythm. Ultimately nicardipine was discontinued and amlodipine was started as well as hydralazine. He was discharged home on 08/09/2013.  His heart rhythm has been fairly stable. He has noticed some mild ankle swelling right greater than left. Lower extremityDoppler studies were negative for DVT. On 08/23/2013  amlodipine was reduced from 10 to7.5 mg and I reduced his amiodarone from 400 mg to 300 mg. Subsequently, he now is on a further reduce dose of amlodipine at just 2.5 mg daily. He does note continued increasing lower extremity edema, right leg greater than left. He has not been taking his furosemide  20 mg regularly.  On 10/31/2013 I discontinued his amlodipine and recommended furosemide 20 mg daily due to his significant  pretibial and ankle edema. We also discussed sodium restriction. Since that time he has been using 20 -30 mm  support stockings below the knee with marked benefit.  A renal duplex scan in February 2015 demonstrated right renal artery narrowing proximally in the range of 1-59% with velocities at 213, suggesting the upper end of the range.  His left renal artery had normal pain. He  He has numerous large anechoic cysts in both kidneys with the largest measuring 8.9 x 7.6 x 8.0.  He is also followed by Dr. Darvin Neighbours urologically.  On 01/12/2015.  He underwent a follow-up renal duplex scan.  He again had mild bilateral renal artery narrowing at the right renal artery.  Increased velocity proximally at 185 in the left renal artery.  A 206 consistent with a 159% range diameter reduction.  The left kidney again showed numerous  an echo.  It cysts.  The 2 largest measured 7.08.3 x a 8.0 cm and 005.005.005.005 cm.  Left kidney could not accurately be measured in length.  Due to large cyst within the lower pole of the kidney.  [name] has had problems with significant blood pressure lability and when seen in the past was taking multiple medications at different times throughout the day.  At that time he was taking Cardura 4 mg daily, hydralazine 50 mg twice a day , and an another dose at 25 mg, furosemide 20 mg daily, valsartan 320  mg.  He also is on levothyroxine 50 g for hypothyroidism.  He takes clonidine 0.1 mg if his blood pressure gets above 283 systolically.  He is also continuing to take amiodarone at 100 mg alternating with 200 mg and denies recurrent AF.  He is anticoagulated with eliquis.  He is very analytical and over the past 6 months he states that he has taken 607 blood pressure readings.  32 readings or 5% revealed blood pressure elevation 151 systolically for which he is took clonidine 5 times or 0.8%.    He has seen [name] on several occasions with significant blood pressure lability with blood pressure readings in excess of 761 systolically and diastolically over 90.  He also states at some times  his blood pressure may get low.  She had tried him on chlorthalidone, but a subsequent be met showed low potassium and sodium and it was discontinued.  At times he has noticed a sensation where he may have had transient atrial fibrillation.   When I last saw him, I tried to simplify his medications and recently he has been taking hydralazine 50 mg every 8 hours, valsartan 320 mg in the morning, Cardura 4 mg, amiodarone 100 mg and if he notes his blood pressure getting in excess of 200 he takes  0.1 mg clonidine PRN. At that time, it sounded like he may have had an episode of breakthrough atrial fibrillation and I slightly adjusted his amiodarone to 100 mg alternating with 200 mg every other day.  He brought with him his blood pressure data over the last several months including means and averages his blood pressure had been running in the 140-150 range, but there have been instances where his blood pressure is in excess of 200.  At his last office visit, I suggested the initiation of spironolactone which he took 12.5 mg for one week and then increase this to 12.5 mg twice a day.  This has resulted in significant stabilization of his blood pressure lability.  He brought with him numerous charts and straightening significant  improvement over the past month, his blood pressure average has been 122/70 with blood pressure range, ranging from 96/55-158/92, averaging 51 readings.  He is unaware of any recurrent atrial fibrillation.  He feels very well.  He denies recurrent chest pain.  He presents for reevaluation  Allergies  Allergen Reactions  . Multaq [Dronedarone] Shortness Of Breath    Current Outpatient Prescriptions  Medication Sig Dispense Refill  . amiodarone (PACERONE) 200 MG tablet Take 0.5 tablets (100 mg total) by mouth daily. 30 tablet 11  . apixaban (ELIQUIS) 2.5 MG TABS tablet Take 1 tablet (2.5 mg total) by mouth 2 (two) times daily. 60 tablet 9  . COMBIGAN 0.2-0.5 % ophthalmic solution Place 1 drop into both eyes 2 (two) times daily.    Marland Kitchen doxazosin (CARDURA) 4 MG tablet TAKE 1 TABLET BY MOUTH EVERY DAY 30 tablet  10  . finasteride (PROSCAR) 5 MG tablet Take 5 mg by mouth daily.    . hydrALAZINE (APRESOLINE) 50 MG tablet TAKE 1 TABLET BY MOUTH EVERY 8 HOURS 270 tablet 0  . levothyroxine (SYNTHROID, LEVOTHROID) 50 MCG tablet Take 50 mcg by mouth daily.    Marland Kitchen LORazepam (ATIVAN) 1 MG tablet TAKE 1/2 TABLET OR 1 TABLET BY MOUTH AT BEDTIME AS NEEDED  2  . spironolactone (ALDACTONE) 25 MG tablet Take 1/2 tablet daily for for one week. Then increase to 1/2 tablet twice a day. 30 tablet 6   No current facility-administered medications for this visit.     Socially he is married and has 3 children he quit tobacco in 1978. 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  ROS General: Negative; No fevers, chills, or night sweats;  HEENT: Positive for history of glaucoma No changes in vision or hearing, sinus congestion, difficulty swallowing Pulmonary: Negative; No cough, wheezing, shortness of breath, hemoptysis Cardiovascular: Negative; No chest pain, presyncope, syncope, palpitations Positive for intermittent leg swelling GI: Negative; No nausea, vomiting, diarrhea, or  abdominal pain GU: Positive for renal cysts; No dysuria, hematuria, or difficulty voiding Musculoskeletal: Negative; no myalgias, joint pain, or weakness Hematologic/Oncology: Negative; no easy bruising, bleeding Endocrine: Negative; no heat/cold intolerance; no diabetes Neuro: Negative; no changes in balance, headaches Skin: Negative; No rashes or skin lesions Psychiatric: Negative; No behavioral problems, depression Sleep: Negative; No snoring, daytime sleepiness, hypersomnolence, bruxism, restless legs, hypnogognic hallucinations, no cataplexy Other comprehensive 14 point system review is negative.    PE BP (!) 124/50 (BP Location: Left Arm, Patient Position: Sitting, Cuff Size: Normal)   Pulse (!) 43   Ht '5\' 8"'$  (1.727 m)   Wt 154 lb 3.2 oz (69.9 kg)   BMI 23.45 kg/m    Repeat blood pressure by me was 152/60  Wt Readings from Last 3 Encounters:  08/18/16 154 lb 3.2 oz (69.9 kg)  06/03/16 153 lb 12.8 oz (69.8 kg)  05/16/16 156 lb 6.4 oz (70.9 kg)   General: Alert, oriented, no distress.  Skin: normal turgor, no rashes HEENT: Normocephalic, atraumatic. Pupils round and reactive; sclera anicteric;no lid lag.  Nose without nasal septal hypertrophy Mouth/Parynx benign; Mallinpatti scale 3 Neck: No JVD, no carotid bruits with normal carotid upstroke. Chest wall: No tenderness to palpate Lungs: clear to ausculatation and percussion; no wheezing or rales Heart:  Regular rhythm but bradycardic with a ventricular rate in the mid 40 -50s; no s3. 1/6 sem; no diastolic murmur. No rubs thrills or heaves. Abdomen: soft, nontender; no hepatosplenomehaly, BS+; abdominal aorta nontender and not dilated by palpation. No definitive renal artery bruit Back: No CVA tenderness Pulses 2+ Extremities: Trace edema above the sock line bilaterally,  ;no clubbing cyanosis, Homan's sign negative  Neurologic: grossly nonfocal; cranial nerves grossly normal Psychologic: Normal affect and mood  ECG  (independently read by me): Sinus bradycardia at 43 bpm with a meal.  First-degree block.  PAC.  QTc interval normal.  July 2017 ECG (independently read by me): Sinus rhythm but bradycardic in the 40s with sinus arrhythmia.  Nonspecific ST changes.  April 2017 ECG (independently read by me): August bradycardia at 53 bpm.  Sinus arrhythmia.  Nonspecific ST changes.  09/24/2015 ECG (independently read by me): Sinus bradycardia, first-degree AV block.  Ventricular rate 51.  PR interval 218 ms.  Nondiagnostic ST changes  May 2016 ECG (independently read by me): Sinus bradycardia 52  bpm.  PR interval 198 ms, QTc interval 451 ms.  Nonspecific interventricular conduction delay.  February 2016 ECG (independently read by me, and (: Sinus bradycardia 48 bpm.  Nonspecific conduction delay.  No significant ST abnormalities.  QTc interval 434 ms.  November 2015 ECG (independently read by me):  Sinus bradycardia 52 bpm.  PR interval 216 ms.  No ectopy.  July 2015ECG (independently read by me): Sinus bradycardia with first degree AV block.  PR interval 240 ms.  Nonspecific ST changes.  PAC.  Prior 01/10/2014 ECG (independently read by me) sinus bradycardia 53 beats per minute. QTc interval 457 ms. No ectopy.  Prior 12/12/2013 ECG  (independently read by me): Sinus rhythm at 56 beats per minute. PR interval 206 ms. QTc interval 470 ms   Prior ECG of 10/31/2013: Normal sinus rhythm at 61 beats per minute. Isolated  PVC.  Non-specific intra-ventricular conduction delay. Nonspecific ST-T changes. QTc interval 461 ms.  LABS:  BMP Latest Ref Rng & Units 06/05/2016 01/31/2016 08/06/2013  Glucose 65 - 99 mg/dL 96 96 97  BUN 8 - 27 mg/dL '25 16 15  '$ Creatinine 0.76 - 1.27 mg/dL 1.28(H) 1.00 0.96  BUN/Creat Ratio 10 - 24 20 - -  Sodium 134 - 144 mmol/L 140 138 133(L)  Potassium 3.5 - 5.2 mmol/L 4.3 4.3 3.6  Chloride 96 - 106 mmol/L 104 104 99  CO2 18 - 29 mmol/L '22 22 23  '$ Calcium 8.6 - 10.2 mg/dL 8.7 9.0 8.3(L)      Hepatic Function Latest Ref Rng & Units 06/05/2016 08/05/2013 07/21/2013  Total Protein 6.0 - 8.5 g/dL 5.9(L) 6.0 6.3  Albumin 3.5 - 4.7 g/dL 3.9 2.9(L) 3.9  AST 0 - 40 IU/L '25 28 24  '$ ALT 0 - 44 IU/L 16 33 21  Alk Phosphatase 39 - 117 IU/L 58 74 66  Total Bilirubin 0.0 - 1.2 mg/dL 1.1 1.0 1.9(H)    CBC Latest Ref Rng & Units 06/05/2016 08/06/2013 08/05/2013  WBC 3.4 - 10.8 x10E3/uL 3.9 6.6 7.3  Hemoglobin 13.0 - 17.0 g/dL - 13.3 13.1  Hematocrit 37.5 - 51.0 % 37.1(L) 36.9(L) 37.3(L)  Platelets 150 - 379 x10E3/uL 167 229 231   Lab Results  Component Value Date   MCV 98 (H) 06/05/2016   MCV 90.0 08/06/2013   MCV 90.5 08/05/2013   Lab Results  Component Value Date   TSH 4.140 06/05/2016   No results found for: HGBA1C   Lipid Panel     Component Value Date/Time   CHOL 161 06/05/2016 0807   TRIG 78 06/05/2016 0807   HDL 68 06/05/2016 0807   CHOLHDL 2.4 06/05/2016 0807   CHOLHDL 3.1 04/06/2013 0850   VLDL 17 04/06/2013 0850   LDLCALC 77 06/05/2016 0807      RADIOLOGY: No results found.    ASSESSMENT AND PLAN: Mr. Guiffre is an 82 year old white male who has a history of PAF and had been maintaining normal sinus rhythm on amiodarone.  Also, recently he has been alternating 200 mg with 100 mg every other day.  His resting pulse today is in the mid 40s and I have suggested that he reduce his amiodarone dose to just 100 mg daily.  He continues to be on eliquis 2.5 g twice a day for anticoagulation.  His blood pressure today and over the past several months has significantly stabilized with the addition of spironolactone to his medical regimen.  Previously it was not uncommon for his blood pressure to increase to over  200.  This has not occurred over the last 4 months.  He is now taking spironolactone 12.5 g twice a day, hydralazine 50 mg every 8 hours, Cardura 4 mg daily.  He denies any PND or orthopnea.  I reviewed his blood work from August 2017.  BUN was 25 and creatinine 1.28,  potassium 4.3 following the initiation of spironolactone.  His lipid studies continues to be good with a total cholesterol 161 and LDL cholesterol 77, triglycerides 78, HDL 68.  His weight is stable.  He continues to be on levothyroxine 50 g for hypothyroidism.  He also is on finasteride 5 mg for his prostate.   He has documented renal cysts on his renal duplex study which have appeared stable and is followed by [name].  I will see him in 6 months for reevaluation.    Time spent: 25 minutes  Troy Sine, MD, [facility]  08/19/2016 5:59 PM

## 2016-08-21 ENCOUNTER — Other Ambulatory Visit: Payer: Self-pay | Admitting: *Deleted

## 2016-08-21 MED ORDER — AMIODARONE HCL 100 MG PO TABS: 100.0000 mg | ORAL_TABLET | Freq: Every day | ORAL | 5 refills | Status: DC

## 2016-08-26 DIAGNOSIS — Z713 Dietary counseling and surveillance: Secondary | ICD-10-CM | POA: Diagnosis not present

## 2016-08-26 DIAGNOSIS — L298 Other pruritus: Secondary | ICD-10-CM | POA: Diagnosis not present

## 2016-08-26 DIAGNOSIS — E663 Overweight: Secondary | ICD-10-CM | POA: Diagnosis not present

## 2016-08-26 DIAGNOSIS — B379 Candidiasis, unspecified: Secondary | ICD-10-CM | POA: Diagnosis not present

## 2016-08-26 DIAGNOSIS — Z23 Encounter for immunization: Secondary | ICD-10-CM | POA: Diagnosis not present

## 2016-08-26 DIAGNOSIS — Z299 Encounter for prophylactic measures, unspecified: Secondary | ICD-10-CM | POA: Diagnosis not present

## 2016-08-28 ENCOUNTER — Other Ambulatory Visit (INDEPENDENT_AMBULATORY_CARE_PROVIDER_SITE_OTHER): Payer: Self-pay | Admitting: *Deleted

## 2016-08-28 ENCOUNTER — Encounter (INDEPENDENT_AMBULATORY_CARE_PROVIDER_SITE_OTHER): Payer: Self-pay | Admitting: *Deleted

## 2016-08-28 DIAGNOSIS — D649 Anemia, unspecified: Secondary | ICD-10-CM

## 2016-08-29 ENCOUNTER — Telehealth (INDEPENDENT_AMBULATORY_CARE_PROVIDER_SITE_OTHER): Payer: Self-pay | Admitting: *Deleted

## 2016-08-29 NOTE — Telephone Encounter (Signed)
Patient presented to the office for his 2 month weight check. He weighed 156.7 lbs. On August 21 he weighed 154 lbs , and previous to that he weighed 153 lbs 12.8 oz.  His weight loss was thought to be secondary to the Spironolactone,(diurectic effect).  Patient is to have lab work on 09/11/2016. He ask about his next OV with Dr.Rehman ,when it should be.  Patient states that he is doing well with B/P and all. No problems.

## 2016-09-01 ENCOUNTER — Encounter (INDEPENDENT_AMBULATORY_CARE_PROVIDER_SITE_OTHER): Payer: Self-pay | Admitting: Internal Medicine

## 2016-09-01 NOTE — Telephone Encounter (Signed)
We will see him in February 2017 unless he starts losing weight.

## 2016-09-01 NOTE — Telephone Encounter (Signed)
Patient called . Spoke with his wife. She will give him Dr.Rehman's recommendation. Also, I explained that they would get a letter for the date and time in February 2018 Appointment.

## 2016-09-07 ENCOUNTER — Inpatient Hospital Stay (HOSPITAL_COMMUNITY)
Admission: EM | Admit: 2016-09-07 | Discharge: 2016-09-09 | DRG: 065 | Disposition: A | Discharge status: Home / Self Care | Payer: Medicare Other | Attending: Family Medicine | Admitting: Family Medicine

## 2016-09-07 ENCOUNTER — Emergency Department (HOSPITAL_COMMUNITY): Payer: Medicare Other

## 2016-09-07 ENCOUNTER — Inpatient Hospital Stay (HOSPITAL_COMMUNITY): Payer: Medicare Other

## 2016-09-07 ENCOUNTER — Encounter (HOSPITAL_COMMUNITY): Payer: Self-pay | Admitting: Emergency Medicine

## 2016-09-07 DIAGNOSIS — E860 Dehydration: Secondary | ICD-10-CM | POA: Diagnosis present

## 2016-09-07 DIAGNOSIS — I110 Hypertensive heart disease with heart failure: Secondary | ICD-10-CM | POA: Diagnosis present

## 2016-09-07 DIAGNOSIS — Z809 Family history of malignant neoplasm, unspecified: Secondary | ICD-10-CM | POA: Diagnosis not present

## 2016-09-07 DIAGNOSIS — N400 Enlarged prostate without lower urinary tract symptoms: Secondary | ICD-10-CM | POA: Diagnosis present

## 2016-09-07 DIAGNOSIS — E039 Hypothyroidism, unspecified: Secondary | ICD-10-CM | POA: Diagnosis present

## 2016-09-07 DIAGNOSIS — Z7901 Long term (current) use of anticoagulants: Secondary | ICD-10-CM | POA: Diagnosis not present

## 2016-09-07 DIAGNOSIS — H409 Unspecified glaucoma: Secondary | ICD-10-CM | POA: Diagnosis present

## 2016-09-07 DIAGNOSIS — I4891 Unspecified atrial fibrillation: Secondary | ICD-10-CM | POA: Diagnosis present

## 2016-09-07 DIAGNOSIS — R233 Spontaneous ecchymoses: Secondary | ICD-10-CM | POA: Diagnosis present

## 2016-09-07 DIAGNOSIS — I63412 Cerebral infarction due to embolism of left middle cerebral artery: Secondary | ICD-10-CM | POA: Diagnosis present

## 2016-09-07 DIAGNOSIS — N401 Enlarged prostate with lower urinary tract symptoms: Secondary | ICD-10-CM | POA: Diagnosis present

## 2016-09-07 DIAGNOSIS — Z6823 Body mass index (BMI) 23.0-23.9, adult: Secondary | ICD-10-CM

## 2016-09-07 DIAGNOSIS — E7800 Pure hypercholesterolemia, unspecified: Secondary | ICD-10-CM | POA: Diagnosis not present

## 2016-09-07 DIAGNOSIS — N179 Acute kidney failure, unspecified: Secondary | ICD-10-CM | POA: Diagnosis present

## 2016-09-07 DIAGNOSIS — I6359 Cerebral infarction due to unspecified occlusion or stenosis of other cerebral artery: Secondary | ICD-10-CM | POA: Diagnosis present

## 2016-09-07 DIAGNOSIS — R001 Bradycardia, unspecified: Secondary | ICD-10-CM | POA: Diagnosis not present

## 2016-09-07 DIAGNOSIS — Z79899 Other long term (current) drug therapy: Secondary | ICD-10-CM

## 2016-09-07 DIAGNOSIS — H5340 Unspecified visual field defects: Secondary | ICD-10-CM

## 2016-09-07 DIAGNOSIS — K219 Gastro-esophageal reflux disease without esophagitis: Secondary | ICD-10-CM | POA: Diagnosis present

## 2016-09-07 DIAGNOSIS — H53461 Homonymous bilateral field defects, right side: Secondary | ICD-10-CM | POA: Diagnosis present

## 2016-09-07 DIAGNOSIS — Z87891 Personal history of nicotine dependence: Secondary | ICD-10-CM

## 2016-09-07 DIAGNOSIS — I480 Paroxysmal atrial fibrillation: Secondary | ICD-10-CM | POA: Diagnosis not present

## 2016-09-07 DIAGNOSIS — I639 Cerebral infarction, unspecified: Secondary | ICD-10-CM | POA: Diagnosis not present

## 2016-09-07 DIAGNOSIS — I6782 Cerebral ischemia: Secondary | ICD-10-CM | POA: Diagnosis not present

## 2016-09-07 DIAGNOSIS — I119 Hypertensive heart disease without heart failure: Secondary | ICD-10-CM | POA: Diagnosis present

## 2016-09-07 DIAGNOSIS — I5032 Chronic diastolic (congestive) heart failure: Secondary | ICD-10-CM | POA: Diagnosis present

## 2016-09-07 DIAGNOSIS — H539 Unspecified visual disturbance: Secondary | ICD-10-CM | POA: Diagnosis not present

## 2016-09-07 HISTORY — DX: Unspecified glaucoma: H40.9

## 2016-09-07 LAB — COMPREHENSIVE METABOLIC PANEL
ALBUMIN: 3.7 g/dL (ref 3.5–5.0)
ALK PHOS: 54 U/L (ref 38–126)
ALT: 19 U/L (ref 17–63)
AST: 27 U/L (ref 15–41)
Anion gap: 5 (ref 5–15)
BUN: 24 mg/dL — ABNORMAL HIGH (ref 6–20)
CALCIUM: 8.6 mg/dL — AB (ref 8.9–10.3)
CHLORIDE: 110 mmol/L (ref 101–111)
CO2: 24 mmol/L (ref 22–32)
CREATININE: 1.33 mg/dL — AB (ref 0.61–1.24)
GFR calc Af Amer: 54 mL/min — ABNORMAL LOW (ref >60)
GFR calc non Af Amer: 46 mL/min — ABNORMAL LOW (ref >60)
GLUCOSE: 103 mg/dL — AB (ref 65–99)
Potassium: 4.2 mmol/L (ref 3.5–5.1)
SODIUM: 139 mmol/L (ref 135–145)
Total Bilirubin: 1.2 mg/dL (ref 0.3–1.2)
Total Protein: 6.2 g/dL — ABNORMAL LOW (ref 6.5–8.1)

## 2016-09-07 LAB — URINALYSIS, ROUTINE W REFLEX MICROSCOPIC
Bilirubin Urine: NEGATIVE
GLUCOSE, UA: NEGATIVE mg/dL
HGB URINE DIPSTICK: NEGATIVE
KETONES UR: NEGATIVE mg/dL
LEUKOCYTES UA: NEGATIVE
Nitrite: NEGATIVE
Protein, ur: NEGATIVE mg/dL
SPECIFIC GRAVITY, URINE: 1.01 (ref 1.005–1.030)
pH: 6 (ref 5.0–8.0)

## 2016-09-07 LAB — I-STAT TROPONIN, ED: Troponin i, poc: 0 ng/mL (ref 0.00–0.08)

## 2016-09-07 LAB — DIFFERENTIAL
BASOS ABS: 0 10*3/uL (ref 0.0–0.1)
BASOS PCT: 1 %
Eosinophils Absolute: 0.1 10*3/uL (ref 0.0–0.7)
Eosinophils Relative: 1 %
LYMPHS PCT: 21 %
Lymphs Abs: 1.1 10*3/uL (ref 0.7–4.0)
Monocytes Absolute: 0.5 10*3/uL (ref 0.1–1.0)
Monocytes Relative: 10 %
NEUTROS ABS: 3.4 10*3/uL (ref 1.7–7.7)
NEUTROS PCT: 67 %

## 2016-09-07 LAB — TSH: TSH: 2.173 u[IU]/mL (ref 0.350–4.500)

## 2016-09-07 LAB — CBC
HCT: 34.3 % — ABNORMAL LOW (ref 39.0–52.0)
Hemoglobin: 11.6 g/dL — ABNORMAL LOW (ref 13.0–17.0)
MCH: 33.1 pg (ref 26.0–34.0)
MCHC: 33.8 g/dL (ref 30.0–36.0)
MCV: 98 fL (ref 78.0–100.0)
PLATELETS: 159 10*3/uL (ref 150–400)
RBC: 3.5 MIL/uL — AB (ref 4.22–5.81)
RDW: 12.9 % (ref 11.5–15.5)
WBC: 5.1 10*3/uL (ref 4.0–10.5)

## 2016-09-07 LAB — APTT: APTT: 34 s (ref 24–36)

## 2016-09-07 LAB — PROTIME-INR
INR: 1.15
INR: 1.14
PROTHROMBIN TIME: 14.8 s (ref 11.4–15.2)
PROTHROMBIN TIME: 14.6 s (ref 11.4–15.2)

## 2016-09-07 MED ORDER — ACETAMINOPHEN 325 MG PO TABS: 650.0000 mg | ORAL_TABLET | Freq: Four times a day (QID) | ORAL | Status: DC | PRN

## 2016-09-07 MED ORDER — APIXABAN 2.5 MG PO TABS
2.5000 mg | ORAL_TABLET | Freq: Two times a day (BID) | ORAL | Status: DC
  Administered 2016-09-07 – 2016-09-08 (×2): 2.5 mg via ORAL
  Filled 2016-09-07 (×2): qty 1

## 2016-09-07 MED ORDER — HYDRALAZINE HCL 20 MG/ML IJ SOLN: 10.0000 mg | Freq: Four times a day (QID) | INTRAMUSCULAR | Status: DC | PRN

## 2016-09-07 MED ORDER — LORAZEPAM 0.5 MG PO TABS
0.5000 mg | ORAL_TABLET | Freq: Every day | ORAL | Status: DC
  Filled 2016-09-07: qty 1

## 2016-09-07 MED ORDER — STROKE: EARLY STAGES OF RECOVERY BOOK
Freq: Once | Status: AC
  Administered 2016-09-08: 22:00:00

## 2016-09-07 MED ORDER — LEVOTHYROXINE SODIUM 50 MCG PO TABS
50.0000 ug | ORAL_TABLET | Freq: Every day | ORAL | Status: DC
  Administered 2016-09-08 – 2016-09-09 (×2): 50 ug via ORAL
  Filled 2016-09-07 (×2): qty 1

## 2016-09-07 MED ORDER — ONDANSETRON HCL 4 MG/2ML IJ SOLN: 4.0000 mg | Freq: Four times a day (QID) | INTRAMUSCULAR | Status: DC | PRN

## 2016-09-07 MED ORDER — BRIMONIDINE TARTRATE 0.2 % OP SOLN
1.0000 [drp] | Freq: Two times a day (BID) | OPHTHALMIC | Status: DC
  Administered 2016-09-07 – 2016-09-08 (×3): 1 [drp] via OPHTHALMIC
  Filled 2016-09-07: qty 5

## 2016-09-07 MED ORDER — FINASTERIDE 5 MG PO TABS
5.0000 mg | ORAL_TABLET | Freq: Every day | ORAL | Status: DC
  Administered 2016-09-07 – 2016-09-09 (×3): 5 mg via ORAL
  Filled 2016-09-07 (×2): qty 1

## 2016-09-07 MED ORDER — BRIMONIDINE TARTRATE-TIMOLOL 0.2-0.5 % OP SOLN: 1.0000 [drp] | Freq: Two times a day (BID) | OPHTHALMIC | Status: DC

## 2016-09-07 MED ORDER — AMIODARONE HCL 100 MG PO TABS
100.0000 mg | ORAL_TABLET | Freq: Every day | ORAL | Status: DC
  Administered 2016-09-08 – 2016-09-09 (×2): 100 mg via ORAL
  Filled 2016-09-07 (×2): qty 1

## 2016-09-07 MED ORDER — TIMOLOL MALEATE 0.5 % OP SOLN
1.0000 [drp] | Freq: Two times a day (BID) | OPHTHALMIC | Status: DC
  Administered 2016-09-07 – 2016-09-08 (×3): 1 [drp] via OPHTHALMIC
  Filled 2016-09-07: qty 5

## 2016-09-07 MED ORDER — ALBUTEROL SULFATE (2.5 MG/3ML) 0.083% IN NEBU: 2.5000 mg | INHALATION_SOLUTION | RESPIRATORY_TRACT | Status: DC | PRN

## 2016-09-07 MED ORDER — DOXAZOSIN MESYLATE 4 MG PO TABS
4.0000 mg | ORAL_TABLET | Freq: Every day | ORAL | Status: DC
  Administered 2016-09-07 – 2016-09-09 (×3): 4 mg via ORAL
  Filled 2016-09-07 (×3): qty 1

## 2016-09-07 MED ORDER — SENNOSIDES-DOCUSATE SODIUM 8.6-50 MG PO TABS
1.0000 | ORAL_TABLET | Freq: Every evening | ORAL | Status: DC | PRN
  Administered 2016-09-08: 1 via ORAL
  Filled 2016-09-07: qty 1

## 2016-09-07 NOTE — H&P (Signed)
TRH H&P   Patient Demographics:    Dylan York, is a 82 y.o. male  MRN: [id number]   DOB - 01/10/1926  Admit Date - 09/07/2016  Outpatient Primary MD for the patient is Glenda Chroman, MD  Outpatient Specialists: Dr Claiborne Billings Cards    Patient coming from: Home  Chief Complaint  Patient presents with  . Vision Changes      HPI:    Dylan York  is a 82 y.o. male, Right-handed male with history of atrial fibrillation on Eliquis and compliant, sinus bradycardia, hypertension, hypothyroidism, glaucoma who lives at home and is in very good health comes in after this morning he noticed that his right-sided visual field was blurry in both eyes, this lasted for about 5-6 hours and self resolved, he did not notice any other symptoms. Came to the ER.  In ER he was symptom-free, initial lab work including head CT were nonacute, I was called to admit the patient for stroke/TIA workup. His review of systems currently is entirely negative.    Review of systems:    In addition to the HPI above,   No Fever-chills, No Headache, No changes with Vision or hearing, No problems swallowing food or Liquids, No Chest pain, Cough or Shortness of Breath, No Abdominal pain, No Nausea or Vommitting, Bowel movements are regular, No Blood in stool or Urine, No dysuria, No new skin rashes or bruises, No new joints pains-aches,  No new weakness, tingling, numbness in any extremity, No recent weight gain or loss, No polyuria, polydypsia or polyphagia, No significant Mental Stressors.  A full 10 point Review of Systems was done, except as stated above, all other Review of Systems were negative.   With Past History of the following :     Past Medical History:  Diagnosis Date  . Atrial tachycardia ([place])   . BPH (benign prostatic hyperplasia)   . GERD (gastroesophageal reflux disease)   . Glaucoma   . Hypertension   . Pneumonia   . Sinus bradycardia, with HR in the 40s BB stopped. 08/08/2013  . Thyroid disease   . Tricuspid regurgitation       Past Surgical History:  Procedure Laterality Date  . APPENDECTOMY    . BACTERIAL OVERGROWTH TEST N/A 01/14/2016   Procedure: BACTERIAL OVERGROWTH TEST;  Surgeon: Rogene Houston, MD;  Location: [facility];  Service: Endoscopy;  Laterality: N/A;  730  . HERNIA REPAIR    . PROSTATE SURGERY        Social History:     Social History  Substance Use Topics  . Smoking status: Former Smoker    Quit date: 11/03/1976  . Smokeless tobacco: Never Used  . Alcohol use No         Family History :     Family History  Problem Relation Age of Onset  . Cancer Mother   . Heart Problems Father     atherosclerosis  . Cancer - Prostate Paternal Grandfather        Home Medications:   Prior to Admission medications   Medication Sig Start Date End Date Taking? Authorizing Provider  amiodarone (PACERONE) 100 MG tablet Take 1 tablet (100 mg total) by mouth daily. 08/21/16  Yes Troy Sine, MD  apixaban (ELIQUIS) 2.5 MG TABS tablet Take 1 tablet (2.5 mg total) by mouth 2 (two) times daily. 12/24/15  Yes Troy Sine, MD  COMBIGAN 0.2-0.5 % ophthalmic solution Place 1 drop into both eyes 2 (two) times daily. 05/13/14  Yes Historical Provider, MD  doxazosin (CARDURA) 4 MG tablet TAKE 1 TABLET BY MOUTH EVERY DAY 04/14/16  Yes Troy Sine, MD  finasteride (PROSCAR) 5 MG tablet Take 5 mg by mouth daily.   Yes Historical Provider, MD  hydrALAZINE (APRESOLINE) 50 MG tablet TAKE 1 TABLET BY MOUTH EVERY 8 HOURS 08/11/16  Yes Troy Sine, MD  levothyroxine (SYNTHROID, LEVOTHROID) 50 MCG tablet Take 50 mcg by mouth daily. 01/13/14  Yes Historical Provider, MD  LORazepam (ATIVAN) 1  MG tablet TAKE 1/2 TABLET OR 1 TABLET BY MOUTH AT BEDTIME AS NEEDED 01/18/16  Yes Historical Provider, MD  spironolactone (ALDACTONE) 25 MG tablet Take 1/2 tablet daily for for one week. Then increase to 1/2 tablet twice a day. 05/16/16  Yes Troy Sine, MD     Allergies:     Allergies  Allergen Reactions  . Multaq [Dronedarone] Shortness Of Breath     Physical Exam:   Vitals  Blood pressure 153/76, pulse (!) 48, temperature 97.6 F (36.4 C), temperature source Oral, resp. rate 11, height 5\' 8"  (1.727 m), weight 70.3 kg (155 lb), SpO2 94 %.   1. General Pleasant elderly white male lying in bed in NAD,     2. Normal affect and insight, Not Suicidal or Homicidal, Awake Alert, Oriented X 3.  3. No F.N deficits, ALL C.Nerves Intact, Strength 5/5 all 4 extremities, Sensation intact all 4 extremities, Plantars down going. Mild left arm dysdiadochokinesis  4. Ears and Eyes appear Normal, Conjunctivae clear, PERRLA. Moist Oral Mucosa.  5. Supple Neck, No JVD, No cervical lymphadenopathy appriciated, No Carotid Bruits.  6. Symmetrical Chest wall movement, Good air movement bilaterally, CTAB.  7. RRR, No Gallops, Rubs or Murmurs, No Parasternal Heave.  8. Positive Bowel Sounds, Abdomen Soft, No tenderness, No organomegaly appriciated,No rebound -guarding or rigidity.  9.  No Cyanosis, Normal Skin Turgor, No Skin Rash or Bruise.  10. Good muscle tone,  joints appear normal , no effusions, Normal ROM.  11. No Palpable Lymph Nodes in Neck or Axillae      Data Review:    CBC  Recent Labs Lab 09/07/16 1201  WBC 5.1  HGB 11.6*  HCT 34.3*  PLT 159  MCV 98.0  MCH 33.1  MCHC 33.8  RDW 12.9  LYMPHSABS 1.1  MONOABS  0.5  EOSABS 0.1  BASOSABS 0.0   ------------------------------------------------------------------------------------------------------------------  Chemistries   Recent Labs Lab 09/07/16 1201  NA 139  K 4.2  CL 110  CO2 24  GLUCOSE 103*  BUN 24*   CREATININE 1.33*  CALCIUM 8.6*  AST 27  ALT 19  ALKPHOS 54  BILITOT 1.2   ------------------------------------------------------------------------------------------------------------------ estimated creatinine clearance is 37.9 mL/min (by C-G formula based on SCr of 1.33 mg/dL (H)). ------------------------------------------------------------------------------------------------------------------ No results for input(s): TSH, T4TOTAL, T3FREE, THYROIDAB in the last 72 hours.  Invalid input(s): FREET3  Coagulation profile  Recent Labs Lab 09/07/16 1201  INR 1.15   ------------------------------------------------------------------------------------------------------------------- No results for input(s): DDIMER in the last 72 hours. -------------------------------------------------------------------------------------------------------------------  Cardiac Enzymes No results for input(s): CKMB, TROPONINI, MYOGLOBIN in the last 168 hours.  Invalid input(s): CK ------------------------------------------------------------------------------------------------------------------ No results found for: BNP   ---------------------------------------------------------------------------------------------------------------  Urinalysis    Component Value Date/Time   COLORURINE YELLOW 09/07/2016 [place] 09/07/2016 1143   LABSPEC 1.010 09/07/2016 1143   PHURINE 6.0 09/07/2016 1143   GLUCOSEU NEGATIVE 09/07/2016 1143   HGBUR NEGATIVE 09/07/2016 1143   BILIRUBINUR NEGATIVE 09/07/2016 1143   KETONESUR NEGATIVE 09/07/2016 1143   PROTEINUR NEGATIVE 09/07/2016 1143   NITRITE NEGATIVE 09/07/2016 1143   LEUKOCYTESUR NEGATIVE 09/07/2016 1143    ----------------------------------------------------------------------------------------------------------------   Imaging Results:    Ct Head Wo Contrast  Result Date: 09/07/2016 CLINICAL DATA:  Visual changes. EXAM: CT HEAD  WITHOUT CONTRAST TECHNIQUE: Contiguous axial images were obtained from the base of the skull through the vertex without intravenous contrast. COMPARISON:  None. FINDINGS: Brain: No evidence of parenchymal hemorrhage or extra-axial fluid collection. Symmetric mild prominence of the CSF space in the bilateral frontal convexities is favored to be due to involutional change. No mass lesion, mass effect, or midline shift. No CT evidence of acute infarction. Intracranial atherosclerosis. Nonspecific moderate subcortical and periventricular white matter hypodensity, most in keeping with chronic small vessel ischemic change. Cerebral volume is age appropriate. No ventriculomegaly. Vascular: No hyperdense vessel or unexpected calcification. Skull: No evidence of calvarial fracture. Sinuses/Orbits: The visualized paranasal sinuses are essentially clear. Other:  The mastoid air cells are unopacified. IMPRESSION: 1.  No evidence of acute intracranial abnormality. 2. Involutional change and moderate chronic small vessel ischemia. Electronically Signed   By: Ilona Sorrel M.D.   On: 09/07/2016 13:10    My personal review of EKG: Rhythm S.Bardy, Rate  52 /min,   no Acute ST changes   Assessment & Plan:     1. Right homonymous hemianopsia, with mild left arm dysdiadochokinesis. Visual symptoms have completely resolved, head CT nonacute, he has history of atrial fibrillation but compliant on Eliquis. At this time he will be transferred to [facility] for a head MRI and neuro eval. Full stroke workup will be conducted.  2. Paroxysmal atrial fibrillation. [place] vasc 2 score of at least 4. Continue amiodarone and Eliquis to be dosed by pharmacy. Currently in sinus. His cardiologist is Dr. Claiborne Billings.  3. Hypertension. For now only as needed IV hydralazine per parameters due to #1 above.  4. Hypothyroidism. Continue home dose Synthroid check TSH.  5. History of sinus bradycardia. At baseline.   DVT Prophylaxis  Eliquis  AM Labs Ordered, also please review Full Orders  Family Communication: Admission, patients condition and plan of care including tests being ordered have been discussed with the patient and wife who indicate understanding and agree with the plan and Code Status.  Code Status Full  Likely DC to  Home 1-2 days  Condition GUARDED    Consults called: Neuro    Admission status: Inpt    Time spent in minutes : 30   Lala Lund K M.D on 09/07/2016 at 2:20 PM  Between 7am to 7pm - Pager - [phone]. After 7pm go to [email or web address] - password [facility]  [facility] - Office  [phone]

## 2016-09-07 NOTE — ED Provider Notes (Signed)
[place] DEPT Provider Note   CSN: [id number] Arrival date & time: 09/07/16  1045  By signing my name below, I, Dolores Hoose, attest that this documentation has been prepared under the direction and in the presence of Julianne Rice, MD . Electronically Signed: Dolores Hoose, Scribe. 09/07/2016. 11:22 AM.  History   Chief Complaint Chief Complaint  Patient presents with  . Vision Changes   The history is provided by the patient. No language interpreter was used.    HPI Comments:  Dylan York is a 82 y.o. male with pmhx of HTN who presents to the Emergency Department complaining of vision changes That he noticed this morning upon waking. Last last normal was at 11 PM yesterday evening before going to sleep. Pt describes his vision loss in his right field of view. Denies double vision or blurred vision. He denies any nausea, vomiting, abdominal pain, or chest pain. He also denies any recent falls or head trauma. States that he was unsteady this morning while walking but that this is improved. He's had no focal weakness or numbness. Recently patient was placed on the pelvic was for atrial fibrillation. Also had blood pressure medication changes at that time. Past Medical History:  Diagnosis Date  . Atrial tachycardia ([place])   . BPH (benign prostatic hyperplasia)   . GERD (gastroesophageal reflux disease)   . Glaucoma   . Hypertension   . Pneumonia   . Sinus bradycardia, with HR in the 40s BB stopped. 08/08/2013  . Thyroid disease   . Tricuspid regurgitation     Patient Active Problem List   Diagnosis Date Noted  . Right homonymous hemianopsia 09/07/2016  . CVA (cerebral vascular accident) ([place]) 09/07/2016  . Hypothyroidism 09/26/2014  . Bilateral renal cysts 01/17/2014  . Lower extremity edema 08/23/2013  . PAF (paroxysmal atrial fibrillation) ([place]) 08/08/2013  . Sinus bradycardia, with HR in the 40s BB stopped. 08/08/2013  . Labile hypertension 08/07/2013  .  Hypertensive crisis 08/05/2013  . Anticoagulated 05/05/2013  . AF (atrial fibrillation) ([place]) 04/04/2013  . Hypertensive heart disease 04/04/2013  . BPH (benign prostatic hyperplasia) 04/04/2013  . GERD (gastroesophageal reflux disease) 04/04/2013  . Sick sinus syndrome ([place]) 04/04/2013    Past Surgical History:  Procedure Laterality Date  . APPENDECTOMY    . BACTERIAL OVERGROWTH TEST N/A 01/14/2016   Procedure: BACTERIAL OVERGROWTH TEST;  Surgeon: Rogene Houston, MD;  Location: [facility];  Service: Endoscopy;  Laterality: N/A;  730  . HERNIA REPAIR    . PROSTATE SURGERY         Home Medications    Prior to Admission medications   Medication Sig Start Date End Date Taking? Authorizing Provider  amiodarone (PACERONE) 100 MG tablet Take 1 tablet (100 mg total) by mouth daily. 08/21/16  Yes Troy Sine, MD  apixaban (ELIQUIS) 2.5 MG TABS tablet Take 1 tablet (2.5 mg total) by mouth 2 (two) times daily. 12/24/15  Yes Troy Sine, MD  COMBIGAN 0.2-0.5 % ophthalmic solution Place 1 drop into both eyes 2 (two) times daily. 05/13/14  Yes Historical Provider, MD  doxazosin (CARDURA) 4 MG tablet TAKE 1 TABLET BY MOUTH EVERY DAY 04/14/16  Yes Troy Sine, MD  finasteride (PROSCAR) 5 MG tablet Take 5 mg by mouth daily.   Yes Historical Provider, MD  hydrALAZINE (APRESOLINE) 50 MG tablet TAKE 1 TABLET BY MOUTH EVERY 8 HOURS 08/11/16  Yes Troy Sine, MD  levothyroxine (SYNTHROID, LEVOTHROID) 50 MCG tablet Take 50  mcg by mouth daily. 01/13/14  Yes Historical Provider, MD  LORazepam (ATIVAN) 1 MG tablet TAKE 1/2 TABLET OR 1 TABLET BY MOUTH AT BEDTIME AS NEEDED 01/18/16  Yes Historical Provider, MD  spironolactone (ALDACTONE) 25 MG tablet Take 1/2 tablet daily for for one week. Then increase to 1/2 tablet twice a day. 05/16/16  Yes Troy Sine, MD    Family History Family History  Problem Relation Age of Onset  . Cancer Mother   . Heart Problems Father     atherosclerosis  .  Cancer - Prostate Paternal Grandfather     Social History Social History  Substance Use Topics  . Smoking status: Former Smoker    Quit date: 11/03/1976  . Smokeless tobacco: Never Used  . Alcohol use No     Allergies   Multaq [dronedarone]   Review of Systems Review of Systems  Constitutional: Negative for chills and fever.  HENT: Negative for facial swelling.   Eyes: Positive for visual disturbance. Negative for pain and redness.  Respiratory: Negative for shortness of breath.   Cardiovascular: Negative for chest pain and palpitations.  Gastrointestinal: Negative for abdominal pain, diarrhea, nausea and vomiting.  Musculoskeletal: Positive for gait problem. Negative for back pain, myalgias, neck pain and neck stiffness.  Skin: Negative for rash and wound.  Neurological: Negative for dizziness, speech difficulty, weakness, light-headedness, numbness and headaches.  All other systems reviewed and are negative.    Physical Exam Updated Vital Signs BP 153/76   Pulse (!) 48   Temp 97.6 F (36.4 C) (Oral)   Resp 11   Ht 5\' 8"  (1.727 m)   Wt 155 lb (70.3 kg)   SpO2 94%   BMI 23.57 kg/m   Physical Exam  Constitutional: He is oriented to person, place, and time. He appears well-developed and well-nourished. No distress.  HENT:  Head: Normocephalic and atraumatic.  Mouth/Throat: Oropharynx is clear and moist. No oropharyngeal exudate.  Eyes: EOM are normal. Pupils are equal, round, and reactive to light.  No nystagmus. Patient appears to have right sided visual field deficit in both eyes. No erythema or discharge. Full range of motion with conjugate gaze.  Neck: Normal range of motion. Neck supple.  No meningismus  Cardiovascular: Regular rhythm.  Exam reveals no gallop and no friction rub.   No murmur heard. Bradycardia.  Pulmonary/Chest: Effort normal and breath sounds normal. No respiratory distress. He has no wheezes. He has no rales. He exhibits no tenderness.    Abdominal: Soft. Bowel sounds are normal. There is no tenderness. There is no rebound and no guarding.  Musculoskeletal: Normal range of motion. He exhibits no edema or tenderness.  Distal pulses in all extremities.  Neurological: He is alert and oriented to person, place, and time.  Patient is alert and oriented x3 with clear, goal oriented speech. Patient has 5/5 motor in all extremities. Sensation is intact to light touch. Bilateral finger-to-nose in the left arm is normal. Slight past pointing with the right arm. Patient mildly unsteady while walking.  Skin: Skin is warm and dry. No rash noted. No erythema.  Psychiatric: He has a normal mood and affect. His behavior is normal.  Nursing note and vitals reviewed.    ED Treatments / Results  DIAGNOSTIC STUDIES:  Oxygen Saturation is 99% on Ra, normal by my interpretation.    COORDINATION OF CARE:  11:43 AM Discussed treatment plan with pt at bedside which includes imaging and lab work and pt agreed to plan.  Labs (all labs ordered are listed, but only abnormal results are displayed) Labs Reviewed  CBC - Abnormal; Notable for the following:       Result Value   RBC 3.50 (*)    Hemoglobin 11.6 (*)    HCT 34.3 (*)    All other components within normal limits  COMPREHENSIVE METABOLIC PANEL - Abnormal; Notable for the following:    Glucose, Bld 103 (*)    BUN 24 (*)    Creatinine, Ser 1.33 (*)    Calcium 8.6 (*)    Total Protein 6.2 (*)    GFR calc non Af Amer 46 (*)    GFR calc Af Amer 54 (*)    All other components within normal limits  PROTIME-INR  APTT  DIFFERENTIAL  URINALYSIS, ROUTINE W REFLEX MICROSCOPIC (NOT AT [facility])  HEMOGLOBIN A1C  TSH  I-STAT TROPOININ, ED    EKG  EKG Interpretation  Date/Time:  Sunday September 07 2016 11:10:30 EST Ventricular Rate:  47 PR Interval:    QRS Duration: 122 QT Interval:  522 QTC Calculation: 462 R Axis:   -13 Text Interpretation:  Sinus bradycardia Nonspecific  intraventricular conduction delay Borderline repolarization abnormality Baseline wander in lead(s) V5 Confirmed by Lita Mains  MD, Naila Elizondo ([id number]) on 09/07/2016 11:17:34 AM       Radiology Ct Head Wo Contrast  Result Date: 09/07/2016 CLINICAL DATA:  Visual changes. EXAM: CT HEAD WITHOUT CONTRAST TECHNIQUE: Contiguous axial images were obtained from the base of the skull through the vertex without intravenous contrast. COMPARISON:  None. FINDINGS: Brain: No evidence of parenchymal hemorrhage or extra-axial fluid collection. Symmetric mild prominence of the CSF space in the bilateral frontal convexities is favored to be due to involutional change. No mass lesion, mass effect, or midline shift. No CT evidence of acute infarction. Intracranial atherosclerosis. Nonspecific moderate subcortical and periventricular white matter hypodensity, most in keeping with chronic small vessel ischemic change. Cerebral volume is age appropriate. No ventriculomegaly. Vascular: No hyperdense vessel or unexpected calcification. Skull: No evidence of calvarial fracture. Sinuses/Orbits: The visualized paranasal sinuses are essentially clear. Other:  The mastoid air cells are unopacified. IMPRESSION: 1.  No evidence of acute intracranial abnormality. 2. Involutional change and moderate chronic small vessel ischemia. Electronically Signed   By: Ilona Sorrel M.D.   On: 09/07/2016 13:10    Procedures Procedures (including critical care time)  Medications Ordered in ED Medications  finasteride (PROSCAR) tablet 5 mg (not administered)  levothyroxine (SYNTHROID, LEVOTHROID) tablet 50 mcg (not administered)  brimonidine-timolol (COMBIGAN) 0.2-0.5 % ophthalmic solution 1 drop (not administered)  LORazepam (ATIVAN) tablet 0.5 mg (not administered)  doxazosin (CARDURA) tablet 4 mg (not administered)  amiodarone (PACERONE) tablet 100 mg (not administered)  hydrALAZINE (APRESOLINE) injection 10 mg (not administered)     Initial  Impression / Assessment and Plan / ED Course  I have reviewed the triage vital signs and the nursing notes.  Pertinent labs & imaging results that were available during my care of the patient were reviewed by me and considered in my medical decision making (see chart for details).  Clinical Course    Concern for acute infarct. Because the patient was last seen normal at 11 PM, code stroke was not initiated. Discussed with Hospitalist and will see and transfer to [facility] for neuro eval.  Final Clinical Impressions(s) / ED Diagnoses   Final diagnoses:  Visual field defect    New Prescriptions New Prescriptions   No medications on file  I personally performed the  services described in this documentation, which was scribed in my presence. The recorded information has been reviewed and is accurate.       Julianne Rice, MD 09/07/16 [phone]

## 2016-09-07 NOTE — Progress Notes (Signed)
Patient admitted from [facility], patient alert and oriented x 4. Patient oriented to room and made comfortable. Tele placed and was verified.

## 2016-09-07 NOTE — ED Notes (Signed)
Pt reports changes to his vision and gait this am. LKW last night at 2300. Pt woke with symptoms this am.

## 2016-09-07 NOTE — ED Triage Notes (Signed)
Pt reports having vision changes that he noticed upon waking this am. Pt reports his vision was normal when he went to bed last night. Pt states he is having "some difficulty walking this am.

## 2016-09-07 NOTE — Progress Notes (Signed)
ANTICOAGULATION CONSULT NOTE - Initial Consult  Pharmacy Consult for Eliquis (resume home dose) Indication: atrial fibrillation  Allergies  Allergen Reactions  . Multaq [Dronedarone] Shortness Of Breath   Patient Measurements: Height: 5\' 8"  (172.7 cm) Weight: 155 lb (70.3 kg) IBW/kg (Calculated) : 68.4  Vital Signs: Temp: 97.6 F (36.4 C) (11/05 1049) Temp Source: Oral (11/05 1049) BP: 153/76 (11/05 1230) Pulse Rate: 48 (11/05 1230)  Labs:  Recent Labs  09/07/16 1201  HGB 11.6*  HCT 34.3*  PLT 159  APTT 34  LABPROT 14.8  INR 1.15  CREATININE 1.33*   Estimated Creatinine Clearance: 37.9 mL/min (by C-G formula based on SCr of 1.33 mg/dL (H)).  Medical History: Past Medical History:  Diagnosis Date  . Atrial tachycardia ([place])   . BPH (benign prostatic hyperplasia)   . GERD (gastroesophageal reflux disease)   . Glaucoma   . Hypertension   . Pneumonia   . Sinus bradycardia, with HR in the 40s BB stopped. 08/08/2013  . Thyroid disease   . Tricuspid regurgitation    Medications:   (Not in a hospital admission)  Assessment: 82yo male on Apixaban at home for h/o PAF.  Plan to resume home dose while in hospital.  Goal of Therapy:  Resume home anticoagulation w/ apixaban Monitor platelets by anticoagulation protocol: Yes   Plan:  Apixaban 2.5mg  po bid (home dose) Monitor for s/sx bleeding complications.    Hart Robinsons A 09/07/2016,2:18 PM

## 2016-09-08 ENCOUNTER — Inpatient Hospital Stay (HOSPITAL_COMMUNITY): Payer: Medicare Other

## 2016-09-08 DIAGNOSIS — I639 Cerebral infarction, unspecified: Secondary | ICD-10-CM

## 2016-09-08 DIAGNOSIS — R001 Bradycardia, unspecified: Secondary | ICD-10-CM

## 2016-09-08 DIAGNOSIS — I4891 Unspecified atrial fibrillation: Secondary | ICD-10-CM

## 2016-09-08 DIAGNOSIS — E039 Hypothyroidism, unspecified: Secondary | ICD-10-CM

## 2016-09-08 LAB — LIPID PANEL
CHOL/HDL RATIO: 3.1 ratio
Cholesterol: 159 mg/dL (ref 0–200)
HDL: 52 mg/dL (ref >40)
LDL CALC: 89 mg/dL (ref 0–99)
TRIGLYCERIDES: 92 mg/dL (ref <150)
VLDL: 18 mg/dL (ref 0–40)

## 2016-09-08 LAB — BASIC METABOLIC PANEL
ANION GAP: 6 (ref 5–15)
BUN: 20 mg/dL (ref 6–20)
CO2: 24 mmol/L (ref 22–32)
Calcium: 9 mg/dL (ref 8.9–10.3)
Chloride: 108 mmol/L (ref 101–111)
Creatinine, Ser: 1.44 mg/dL — ABNORMAL HIGH (ref 0.61–1.24)
GFR, EST AFRICAN AMERICAN: 49 mL/min — AB (ref >60)
GFR, EST NON AFRICAN AMERICAN: 42 mL/min — AB (ref >60)
Glucose, Bld: 118 mg/dL — ABNORMAL HIGH (ref 65–99)
POTASSIUM: 4.3 mmol/L (ref 3.5–5.1)
SODIUM: 138 mmol/L (ref 135–145)

## 2016-09-08 LAB — CREATININE, SERUM
CREATININE: 1.54 mg/dL — AB (ref 0.61–1.24)
GFR calc Af Amer: 45 mL/min — ABNORMAL LOW (ref >60)
GFR, EST NON AFRICAN AMERICAN: 39 mL/min — AB (ref >60)

## 2016-09-08 LAB — ECHOCARDIOGRAM COMPLETE
Height: 68 in
WEIGHTICAEL: 2480 [oz_av]

## 2016-09-08 LAB — HEMOGLOBIN A1C
HEMOGLOBIN A1C: 5.6 % (ref 4.8–5.6)
Mean Plasma Glucose: 114 mg/dL

## 2016-09-08 MED ORDER — ATORVASTATIN CALCIUM 20 MG PO TABS: 20.0000 mg | ORAL_TABLET | Freq: Every day | ORAL | 0 refills | Status: DC

## 2016-09-08 MED ORDER — ATORVASTATIN CALCIUM 40 MG PO TABS: 40.0000 mg | ORAL_TABLET | Freq: Every day | ORAL | Status: DC

## 2016-09-08 MED ORDER — ASPIRIN EC 325 MG PO TBEC
325.0000 mg | DELAYED_RELEASE_TABLET | Freq: Every day | ORAL | Status: DC
  Administered 2016-09-09: 325 mg via ORAL
  Filled 2016-09-08: qty 1

## 2016-09-08 MED ORDER — SODIUM CHLORIDE 0.9 % IV SOLN: INTRAVENOUS | Status: DC

## 2016-09-08 MED ORDER — SODIUM CHLORIDE 0.9 % IV SOLN
INTRAVENOUS | Status: DC
  Administered 2016-09-08 – 2016-09-09 (×2): via INTRAVENOUS

## 2016-09-08 MED ORDER — SODIUM CHLORIDE 0.9 % IV BOLUS (SEPSIS)
500.0000 mL | Freq: Once | INTRAVENOUS | Status: AC
  Administered 2016-09-08: 500 mL via INTRAVENOUS

## 2016-09-08 MED ORDER — ATORVASTATIN CALCIUM 10 MG PO TABS
20.0000 mg | ORAL_TABLET | Freq: Every day | ORAL | Status: DC
  Administered 2016-09-08: 20 mg via ORAL
  Filled 2016-09-08: qty 2

## 2016-09-08 NOTE — Progress Notes (Signed)
Occupational Therapy Evaluation Patient Details Name: Dylan York MRN: [id number] DOB: 1925-12-16 Today's Date: 09/08/2016    History of Present Illness 82 y.o. male, Right-handed male with history of atrial fibrillation on Eliquis and compliant, sinus bradycardia, hypertension, hypothyroidism, glaucoma who lives at home and is in very good health. Admitt complaints of R visual field deficits.  MRI +small volume acute cortical and subcortical L parieto-occipital ischemic infarcts   Clinical Impression   PTA, pt independent with all ADL and mobility. Pt presents with complaints of R visual field loss. Pt states that he could only see "1 eye on someone's face, or 1 hand on his watch". States his vision started to return while at [facility]. Pt with history of Glaucoma and states he does not feel his vision has returned 100% "after this episode". At this time, recommended that pt have a full field visual test completed by his eye doctor to objectively assess his visual fields to facilitate his safe return to driving. Pt verbalized understanding.     Follow Up Recommendations  Supervision - Intermittent    Equipment Recommendations  None recommended by OT    Recommendations for Other Services Other (comment)Full field visual test before returning to driving.      Precautions / Restrictions Precautions Precautions: None Precaution Comments: reported vision loss but has resovled Restrictions Weight Bearing Restrictions: No      Mobility Bed Mobility               General bed mobility comments: pt up in chair   Transfers Overall transfer level: Modified independent Equipment used: None Transfers: Sit to/from Stand          General transfer comment: good use of hands, no instability    Balance Overall balance assessment: No apparent balance deficits (not formally assessed)                                    ADL Overall ADL's : At baseline                                             Vision Vision Assessment?: Yes Eye Alignment: Within Functional Limits Ocular Range of Motion: Within Functional Limits Alignment/Gaze Preference: Within Defined Limits Tracking/Visual Pursuits: Able to track stimulus in all quads without difficulty Saccades: Additional eye shifts occurred during testing Convergence: Within functional limits Visual Fields: Other (comment) Additional Comments: Pt reports that when this first happened, he could only see 1 eye on a person's face, or 1 hand on his watch. States it has improved but he does not feel like it is 100% normal   Perception Perception Comments: appears intact   Praxis Praxis Praxis tested?: Within functional limits    Pertinent Vitals/Pain Pain Assessment: No/denies pain     Hand Dominance Right   Extremity/Trunk Assessment Upper Extremity Assessment Upper Extremity Assessment: Overall WFL for tasks assessed LUE Deficits / Details: grossly 4/5   Lower Extremity Assessment Lower Extremity Assessment: Overall WFL for tasks assessed   Cervical / Trunk Assessment Cervical / Trunk Assessment: Normal   Communication Communication Communication: No difficulties   Cognition Arousal/Alertness: Awake/alert Behavior During Therapy: WFL for tasks assessed/performed Overall Cognitive Status: Within Functional Limits for tasks assessed  General Comments       Exercises       Shoulder Instructions      Home Living Family/patient expects to be discharged to:: Private residence Living Arrangements: Spouse/significant other Available Help at Discharge: Family;Available 24 hours/day Type of Home: House Home Access: Level entry     Home Layout: One level     Bathroom Shower/Tub: Teacher, early years/pre:  (elevated) Bathroom Accessibility: Yes   Home Equipment: None          Prior Functioning/Environment Level of  Independence: Independent     drives           OT Problem List: Impaired vision/perception   OT Treatment/Interventions:      OT Goals(Current goals can be found in the care plan section) Acute Rehab OT Goals Patient Stated Goal: home today for a banquet this evening OT Goal Formulation: All assessment and education complete, DC therapy  OT Frequency:     Barriers to D/C:            Co-evaluation              End of Session Nurse Communication: Mobility status  Activity Tolerance: Patient tolerated treatment well Patient left: in chair;with call bell/phone within reach;with family/visitor present   Time: [id number] OT Time Calculation (min): 24 min Charges:  OT General Charges $OT Visit: 1 Procedure OT Evaluation $OT Eval Moderate Complexity: 1 Procedure OT Treatments $Therapeutic Activity: 8-22 mins G-Codes:    Vanassa Penniman,HILLARY 09-09-16, 10:36 AM   Maurie Boettcher, OTR/L  [phone] 09-Sep-2016

## 2016-09-08 NOTE — Progress Notes (Signed)
VASCULAR LAB PRELIMINARY  PRELIMINARY  PRELIMINARY  PRELIMINARY  Carotid duplex completed.    Preliminary report:  1-39% ICA plaquing.  Vertebral artery flow is antegrade.   Marrion Finan, RVT 09/08/2016, 9:35 AM

## 2016-09-08 NOTE — Progress Notes (Signed)
OT Cancellation Note  Patient Details Name: Dylan York MRN: [id number] DOB: 1926-07-11   Cancelled Treatment:    Reason Eval/Treat Not Completed: Patient at procedure or test/ unavailable  [place], OTR/L  [id number] 09/08/2016 09/08/2016, 8:57 AM

## 2016-09-08 NOTE — Evaluation (Signed)
Physical Therapy Evaluation Patient Details Name: Dylan York MRN: [id number] DOB: 08-17-1926 Today's Date: 09/08/2016   History of Present Illness  WilliamMikeis a 82 y.o.male,Right-handed male with history of atrial fibrillation, sinus bradycardia, hypertension, hypothyroidism, glaucoma who lives at home and is in very good health comes in after this morning he noticed that his right-sided visual field was blurry in both eyes, this lasted for about 5-6 hours and self resolved, he did not notice any other symptoms.   Clinical Impression  Pt admitted with above. Pt at increased falls risk as demo'd by score of 16/24 on DGI. Pt aware of balance deficits and agrees that the cane helps him and is agreeable to use it. Discussed benefit of outpt PT to address weakness and balance to improve stability and decrease falls risk. Pt strongly desires to d/c today due to a "large banquet" they have tonight.  Acute PT to con't to follow.    Follow Up Recommendations Outpatient PT;Supervision/Assistance - 24 hour    Equipment Recommendations  Cane    Recommendations for Other Services       Precautions / Restrictions Precautions Precautions: Fall Precaution Comments: reported vision loss but has resovled Restrictions Weight Bearing Restrictions: No      Mobility  Bed Mobility               General bed mobility comments: pt up in chair upon PT arrival  Transfers Overall transfer level: Needs assistance Equipment used: None Transfers: Sit to/from Stand Sit to Stand: Supervision         General transfer comment: good use of hands, no instability  Ambulation/Gait Ambulation/Gait assistance: Min guard Ambulation Distance (Feet): 300 Feet Assistive device: Straight cane;None Gait Pattern/deviations: Staggering left;Staggering right Gait velocity: fair for age   General Gait Details: pt initially ambulated without cane and drifted L/R with report "i tell my wife i feel  like a drunk sometimes" pt with occasional crossover gait pattern  Pt given cane, pt with improved stability and no drifting. pt reports "i put more pressure throught the cane than I thought I would, I believe it helps me alot"  Stairs Stairs: Yes Stairs assistance: Min guard Stair Management: One rail Right;Alternating pattern Number of Stairs: 4    Wheelchair Mobility    Modified Rankin (Stroke Patients Only)       Balance                                 Standardized Balance Assessment Standardized Balance Assessment : Dynamic Gait Index   Dynamic Gait Index Level Surface: Mild Impairment Change in Gait Speed: Mild Impairment Gait with Horizontal Head Turns: Mild Impairment Gait with Vertical Head Turns: Mild Impairment Gait and Pivot Turn: Mild Impairment Step Over Obstacle: Mild Impairment Step Around Obstacles: Mild Impairment Steps: Mild Impairment Total Score: 16       Pertinent Vitals/Pain Pain Assessment: No/denies pain    Home Living Family/patient expects to be discharged to:: Private residence Living Arrangements: Spouse/significant other Available Help at Discharge: Family;Available 24 hours/day Type of Home: House Home Access: Level entry     Home Layout: One level Home Equipment: None      Prior Function Level of Independence: Independent         Comments: pt reports "ive had some close calls, where i've had to catch my self" in regard to falls. pt reports he does all the yard work  Hand Dominance   Dominant Hand: Right    Extremity/Trunk Assessment   Upper Extremity Assessment: LUE deficits/detail       LUE Deficits / Details: grossly 4/5   Lower Extremity Assessment: Overall WFL for tasks assessed      Cervical / Trunk Assessment: Normal  Communication   Communication: No difficulties  Cognition Arousal/Alertness: Awake/alert Behavior During Therapy: WFL for tasks assessed/performed Overall Cognitive  Status: Within Functional Limits for tasks assessed                      General Comments      Exercises     Assessment/Plan    PT Assessment Patient needs continued PT services  PT Problem List Decreased strength;Decreased activity tolerance;Decreased balance;Decreased mobility;Decreased coordination;Decreased knowledge of use of DME          PT Treatment Interventions DME instruction;Gait training;Stair training;Functional mobility training;Therapeutic activities;Balance training;Therapeutic exercise    PT Goals (Current goals can be found in the Care Plan section)  Acute Rehab PT Goals Patient Stated Goal: home today for a banquet this evening PT Goal Formulation: With patient Time For Goal Achievement: 09/15/16 Potential to Achieve Goals: Good Additional Goals Additional Goal #1: Pt to score >19 on DGI to indicate minimal falls risk.    Frequency Min 3X/week   Barriers to discharge        Co-evaluation               End of Session Equipment Utilized During Treatment: Gait belt Activity Tolerance: Patient tolerated treatment well Patient left: in chair;with call bell/phone within reach;with chair alarm set;with family/visitor present Nurse Communication: Mobility status         Time: [id number] PT Time Calculation (min) (ACUTE ONLY): 30 min   Charges:   PT Evaluation $PT Eval Moderate Complexity: 1 Procedure PT Treatments $Gait Training: 8-22 mins   PT G CodesKingsley Callander 09/08/2016, 8:22 AM   Kittie Plater, PT, DPT Pager #: [phone] Office #: [phone]

## 2016-09-08 NOTE — Progress Notes (Signed)
  Echocardiogram 2D Echocardiogram has been performed.  Diamond Nickel 09/08/2016, 9:33 AM

## 2016-09-08 NOTE — Consult Note (Signed)
Requesting Physician: Dr. Teryl Lucy    Chief Complaint: Stroke  History obtained from:  Patient    HPI:                                                                                                                                         Dylan York is an 82 y.o. male with history of atrial fibrillation on Eliquis. Patient was doing well until yesterday morning when he noticed that his right visual field was blurry in both eyes. This lasted for about 5-6 hours and then self resolved. Patient had no other symptoms. Patient came to the the ER to be further evaluated. Currently patient is asymptomatic. Results from hospitalization stroke workup are as below. Patient states he has not missed any doses of Eliquis.        MRI brain: Small volume acute cortical and subcortical left parieto-occipital ischemic infarcts as above. Associated petechial hemorrhage without frank hemorrhagic transformation.   MRA Brain: No significant stenosis  Carotid Doppler:  Impressions:  - Compared to the prior study, there has been no significant   interval change.  LDL-- 89  A1c-- 5.6        Date last known well: Date: 09/07/2016 Time last known well: Unable to determine tPA Given: No: out of window and resolved symptoms N Past Medical History:  Diagnosis Date  . Atrial tachycardia ([place])   . BPH (benign prostatic hyperplasia)   . GERD (gastroesophageal reflux disease)   . Glaucoma   . Hypertension   . Pneumonia   . Sinus bradycardia, with HR in the 40s BB stopped. 08/08/2013  . Thyroid disease   . Tricuspid regurgitation     Past Surgical History:  Procedure Laterality Date  . APPENDECTOMY    . BACTERIAL OVERGROWTH TEST N/A 01/14/2016   Procedure: BACTERIAL OVERGROWTH TEST;  Surgeon: Rogene Houston, MD;  Location: [facility];  Service: Endoscopy;  Laterality: N/A;  730  . HERNIA REPAIR    . PROSTATE SURGERY      Family History  Problem Relation Age of Onset  . Cancer Mother    . Heart Problems Father     atherosclerosis  . Cancer - Prostate Paternal Grandfather    Social History:  reports that he quit smoking about 39 years ago. He has never used smokeless tobacco. He reports that he does not drink alcohol or use drugs.  Allergies:  Allergies  Allergen Reactions  . Multaq [Dronedarone] Shortness Of Breath    Medications:  Prior to Admission:  Prescriptions Prior to Admission  Medication Sig Dispense Refill Last Dose  . amiodarone (PACERONE) 100 MG tablet Take 1 tablet (100 mg total) by mouth daily. 30 tablet 5 09/07/2016 at Unknown time  . apixaban (ELIQUIS) 2.5 MG TABS tablet Take 1 tablet (2.5 mg total) by mouth 2 (two) times daily. 60 tablet 9 09/07/2016 at 0830  . COMBIGAN 0.2-0.5 % ophthalmic solution Place 1 drop into both eyes 2 (two) times daily.   09/07/2016 at Unknown time  . doxazosin (CARDURA) 4 MG tablet TAKE 1 TABLET BY MOUTH EVERY DAY 30 tablet 10 09/06/2016 at Unknown time  . finasteride (PROSCAR) 5 MG tablet Take 5 mg by mouth daily.   09/06/2016 at Unknown time  . hydrALAZINE (APRESOLINE) 50 MG tablet TAKE 1 TABLET BY MOUTH EVERY 8 HOURS 270 tablet 0 09/07/2016 at Unknown time  . levothyroxine (SYNTHROID, LEVOTHROID) 50 MCG tablet Take 50 mcg by mouth daily.   09/07/2016 at Unknown time  . LORazepam (ATIVAN) 1 MG tablet TAKE 1/2 TABLET OR 1 TABLET BY MOUTH AT BEDTIME AS NEEDED  2 09/06/2016 at Unknown time  . spironolactone (ALDACTONE) 25 MG tablet Take 1/2 tablet daily for for one week. Then increase to 1/2 tablet twice a day. 30 tablet 6 09/07/2016 at Unknown time   Scheduled: .  stroke: mapping our early stages of recovery book   Does not apply Once  . amiodarone  100 mg Oral Daily  . apixaban  2.5 mg Oral BID  . brimonidine  1 drop Both Eyes BID  . doxazosin  4 mg Oral Daily  . finasteride  5 mg Oral Daily  .  levothyroxine  50 mcg Oral QAC breakfast  . LORazepam  0.5 mg Oral QHS  . timolol  1 drop Both Eyes BID    ROS:                                                                                                                                       History obtained from the patient  General ROS: negative for - chills, fatigue, fever, night sweats, weight gain or weight loss Psychological ROS: negative for - behavioral disorder, hallucinations, memory difficulties, mood swings or suicidal ideation Ophthalmic ROS: negative for - blurry vision, double vision, eye pain or loss of vision ENT ROS: negative for - epistaxis, nasal discharge, oral lesions, sore throat, tinnitus or vertigo Allergy and Immunology ROS: negative for - hives or itchy/watery eyes Hematological and Lymphatic ROS: negative for - bleeding problems, bruising or swollen lymph nodes Endocrine ROS: negative for - galactorrhea, hair pattern changes, polydipsia/polyuria or temperature intolerance Respiratory ROS: negative for - cough, hemoptysis, shortness of breath or wheezing Cardiovascular ROS: negative for - chest pain, dyspnea on exertion, edema or irregular heartbeat Gastrointestinal ROS: negative for - abdominal pain, diarrhea, hematemesis, nausea/vomiting or stool incontinence Genito-Urinary ROS: negative for - dysuria, hematuria, incontinence  or urinary frequency/urgency Musculoskeletal ROS: negative for - joint swelling or muscular weakness Neurological ROS: as noted in HPI Dermatological ROS: negative for rash and skin lesion changes  Neurologic Examination:                                                                                                      Blood pressure 110/64, pulse (!) 59, temperature 98 F (36.7 C), temperature source Oral, resp. rate 20, height 5\' 8"  (1.727 m), weight 70.3 kg (155 lb), SpO2 94 %.  HEENT-  Normocephalic, no lesions, without obvious abnormality.  Normal external eye and conjunctiva.   Normal TM's bilaterally.  Normal auditory canals and external ears. Normal external nose, mucus membranes and septum.  Normal pharynx. Cardiovascular- S1, S2 normal, pulses palpable throughout   Lungs- chest clear, no wheezing, rales, normal symmetric air entry Abdomen- normal findings: bowel sounds normal Extremities- no edema Lymph-no adenopathy palpable Musculoskeletal-no joint tenderness, deformity or swelling Skin-warm and dry, no hyperpigmentation, vitiligo, or suspicious lesions  Neurological Examination Mental Status: Alert, oriented, thought content appropriate.  Speech fluent without evidence of aphasia.  Able to follow 3 step commands without difficulty. Cranial Nerves: II: Discs flat bilaterally; Visual fields grossly normal, pupils equal, round, reactive to light and accommodation III,IV, VI: ptosis not present, extra-ocular motions intact bilaterally V,VII: smile symmetric, facial light touch sensation normal bilaterally VIII: hearing normal bilaterally IX,X: uvula rises symmetrically XI: bilateral shoulder shrug XII: midline tongue extension Motor: Right : Upper extremity   5/5    Left:     Upper extremity   5/5  Lower extremity   5/5     Lower extremity   5/5 Tone and bulk:normal tone throughout; no atrophy noted Sensory: Pinprick and light touch intact throughout, bilaterally Deep Tendon Reflexes: 2+ and symmetric throughout Plantars: Right: downgoing   Left: downgoing Cerebellar: normal finger-to-nose,  and normal heel-to-shin test Gait: normal gait and station       Lab Results: Basic Metabolic Panel:  Recent Labs Lab 09/07/16 1201 09/08/16 1022  NA 139 138  K 4.2 4.3  CL 110 108  CO2 24 24  GLUCOSE 103* 118*  BUN 24* 20  CREATININE 1.33* 1.44*  CALCIUM 8.6* 9.0    Liver Function Tests:  Recent Labs Lab 09/07/16 1201  AST 27  ALT 19  ALKPHOS 54  BILITOT 1.2  PROT 6.2*  ALBUMIN 3.7   No results for input(s): LIPASE, AMYLASE in the  last 168 hours. No results for input(s): AMMONIA in the last 168 hours.  CBC:  Recent Labs Lab 09/07/16 1201  WBC 5.1  NEUTROABS 3.4  HGB 11.6*  HCT 34.3*  MCV 98.0  PLT 159    Cardiac Enzymes: No results for input(s): CKTOTAL, CKMB, CKMBINDEX, TROPONINI in the last 168 hours.  Lipid Panel:  Recent Labs Lab 09/08/16 0507  CHOL 159  TRIG 92  HDL 52  CHOLHDL 3.1  VLDL 18  LDLCALC 89    CBG: No results for input(s): GLUCAP in the last 168 hours.  Microbiology: Results for orders placed or performed  during the hospital encounter of 08/05/13  MRSA PCR Screening     Status: None   Collection Time: 08/05/13  6:38 PM  Result Value Ref Range Status   MRSA by PCR NEGATIVE NEGATIVE Final    Comment:        The GeneXpert MRSA Assay (FDA approved for NASAL specimens only), is one component of a comprehensive MRSA colonization surveillance program. It is not intended to diagnose MRSA infection nor to guide or monitor treatment for MRSA infections.    Coagulation Studies:  Recent Labs  09/07/16 1201 09/07/16 2025  LABPROT 14.8 14.6  INR 1.15 1.14    Imaging: Ct Head Wo Contrast  Result Date: 09/07/2016 CLINICAL DATA:  Visual changes. EXAM: CT HEAD WITHOUT CONTRAST TECHNIQUE: Contiguous axial images were obtained from the base of the skull through the vertex without intravenous contrast. COMPARISON:  None. FINDINGS: Brain: No evidence of parenchymal hemorrhage or extra-axial fluid collection. Symmetric mild prominence of the CSF space in the bilateral frontal convexities is favored to be due to involutional change. No mass lesion, mass effect, or midline shift. No CT evidence of acute infarction. Intracranial atherosclerosis. Nonspecific moderate subcortical and periventricular white matter hypodensity, most in keeping with chronic small vessel ischemic change. Cerebral volume is age appropriate. No ventriculomegaly. Vascular: No hyperdense vessel or unexpected  calcification. Skull: No evidence of calvarial fracture. Sinuses/Orbits: The visualized paranasal sinuses are essentially clear. Other:  The mastoid air cells are unopacified. IMPRESSION: 1.  No evidence of acute intracranial abnormality. 2. Involutional change and moderate chronic small vessel ischemia. Electronically Signed   By: Ilona Sorrel M.D.   On: 09/07/2016 13:10   Mr Brain Wo Contrast  Result Date: 09/07/2016 CLINICAL DATA:  Initial evaluation for acute right-sided visual defect (homonymous hemianopia), with left upper extremity dysdiadochokinesis. EXAM: MRI HEAD WITHOUT CONTRAST MRA HEAD WITHOUT CONTRAST TECHNIQUE: Multiplanar, multiecho pulse sequences of the brain and surrounding structures were obtained without intravenous contrast. Angiographic images of the head were obtained using MRA technique without contrast. COMPARISON:  Comparison made with prior CT from earlier the same day. FINDINGS: MRI HEAD FINDINGS Brain: Diffuse prominence of the CSF containing spaces is compatible with generalized cerebral atrophy. The patchy and confluent T2/FLAIR hyperintensity within the periventricular and deep white matter both cerebral hemispheres most compatible with moderate chronic microvascular ischemic disease. Chronic small vessel ischemic changes present within the pons as well. There is a small area of patchy restricted diffusion within the cortical gray matter of the left parietal lobe, compatible with acute ischemic infarct (series 4, image 31). Mild linear patchy diffusion abnormality within the subjacent left periatrial white matter (series 4, image 27). No significant mass effect. Associated petechial hemorrhage on SWI sequence without frank hemorrhagic transformation (series 9, image 64). No other areas of acute or subacute infarct. Gray-white matter differentiation otherwise maintained. No other evidence for acute or chronic intracranial hemorrhage. No other areas of chronic infarction  identified. No mass lesion, midline shift, or mass effect. No hydrocephalus. No extra-axial fluid collection. Major dural sinuses are grossly patent. Pituitary gland within normal limits. No sellar expansion. Suprasellar cistern unremarkable. Vascular: Major intracranial vascular flow voids are maintained. Skull and upper cervical spine: Craniocervical junction within normal limits. Multilevel degenerative spondylolysis noted within the visualized upper cervical spine without significant stenosis. Degenerative thickening of the tectorial membrane noted. Bone marrow signal intensity within normal limits. No scalp soft tissue abnormality. Sinuses/Orbits: Globes and orbital soft tissues within normal limits. Patient is status post cataract extraction  bilaterally. Mild scattered mucosal thickening within the ethmoidal air cells right maxillary retention cyst noted. Paranasal sinuses are otherwise clear. No mastoid effusion. Inner ear structures within normal limits. MRA HEAD FINDINGS ANTERIOR CIRCULATION: Distal cervical segments of the internal carotid arteries are patent with antegrade flow. Petrous, cavernous, and supraclinoid segments are widely patent without flow-limiting stenosis. A1 segments widely patent. Anterior communicating normal. Anterior cerebral arteries are widely patent to their distal aspects. M1 segments patent without stenosis or occlusion. No proximal M2 occlusion. Distal MCA branches well opacified and symmetric. POSTERIOR CIRCULATION: Left vertebral artery dominant and widely patent. Right vertebral artery diminutive but patent as well. Partially visualized posterior inferior cerebral arteries patent bilaterally. Basilar artery tortuous but well opacified to its distal aspect. No basilar stenosis. Superior cerebral arteries patent bilaterally. Both of the posterior cerebral artery supplied via the basilar artery and are well opacified to their distal aspects. No aneurysm or vascular  malformation. IMPRESSION: MRI HEAD IMPRESSION: 1. Small volume acute cortical and subcortical left parieto-occipital ischemic infarcts as above. Associated petechial hemorrhage without frank hemorrhagic transformation. No associated mass effect. 2. Moderate chronic microvascular ischemic disease. MRA HEAD IMPRESSION: Normal intracranial MRA for patient age. No large or proximal arterial branch occlusion. No high-grade or correctable stenosis. Electronically Signed   By: Jeannine Boga M.D.   On: 09/07/2016 20:25   Mr Jodene Nam Head/brain [id number] Cm  Result Date: 09/07/2016 CLINICAL DATA:  Initial evaluation for acute right-sided visual defect (homonymous hemianopia), with left upper extremity dysdiadochokinesis. EXAM: MRI HEAD WITHOUT CONTRAST MRA HEAD WITHOUT CONTRAST TECHNIQUE: Multiplanar, multiecho pulse sequences of the brain and surrounding structures were obtained without intravenous contrast. Angiographic images of the head were obtained using MRA technique without contrast. COMPARISON:  Comparison made with prior CT from earlier the same day. FINDINGS: MRI HEAD FINDINGS Brain: Diffuse prominence of the CSF containing spaces is compatible with generalized cerebral atrophy. The patchy and confluent T2/FLAIR hyperintensity within the periventricular and deep white matter both cerebral hemispheres most compatible with moderate chronic microvascular ischemic disease. Chronic small vessel ischemic changes present within the pons as well. There is a small area of patchy restricted diffusion within the cortical gray matter of the left parietal lobe, compatible with acute ischemic infarct (series 4, image 31). Mild linear patchy diffusion abnormality within the subjacent left periatrial white matter (series 4, image 27). No significant mass effect. Associated petechial hemorrhage on SWI sequence without frank hemorrhagic transformation (series 9, image 64). No other areas of acute or subacute infarct. Gray-white  matter differentiation otherwise maintained. No other evidence for acute or chronic intracranial hemorrhage. No other areas of chronic infarction identified. No mass lesion, midline shift, or mass effect. No hydrocephalus. No extra-axial fluid collection. Major dural sinuses are grossly patent. Pituitary gland within normal limits. No sellar expansion. Suprasellar cistern unremarkable. Vascular: Major intracranial vascular flow voids are maintained. Skull and upper cervical spine: Craniocervical junction within normal limits. Multilevel degenerative spondylolysis noted within the visualized upper cervical spine without significant stenosis. Degenerative thickening of the tectorial membrane noted. Bone marrow signal intensity within normal limits. No scalp soft tissue abnormality. Sinuses/Orbits: Globes and orbital soft tissues within normal limits. Patient is status post cataract extraction bilaterally. Mild scattered mucosal thickening within the ethmoidal air cells right maxillary retention cyst noted. Paranasal sinuses are otherwise clear. No mastoid effusion. Inner ear structures within normal limits. MRA HEAD FINDINGS ANTERIOR CIRCULATION: Distal cervical segments of the internal carotid arteries are patent with antegrade flow. Petrous, cavernous, and supraclinoid segments  are widely patent without flow-limiting stenosis. A1 segments widely patent. Anterior communicating normal. Anterior cerebral arteries are widely patent to their distal aspects. M1 segments patent without stenosis or occlusion. No proximal M2 occlusion. Distal MCA branches well opacified and symmetric. POSTERIOR CIRCULATION: Left vertebral artery dominant and widely patent. Right vertebral artery diminutive but patent as well. Partially visualized posterior inferior cerebral arteries patent bilaterally. Basilar artery tortuous but well opacified to its distal aspect. No basilar stenosis. Superior cerebral arteries patent bilaterally. Both of  the posterior cerebral artery supplied via the basilar artery and are well opacified to their distal aspects. No aneurysm or vascular malformation. IMPRESSION: MRI HEAD IMPRESSION: 1. Small volume acute cortical and subcortical left parieto-occipital ischemic infarcts as above. Associated petechial hemorrhage without frank hemorrhagic transformation. No associated mass effect. 2. Moderate chronic microvascular ischemic disease. MRA HEAD IMPRESSION: Normal intracranial MRA for patient age. No large or proximal arterial branch occlusion. No high-grade or correctable stenosis. Electronically Signed   By: Jeannine Boga M.D.   On: 09/07/2016 20:25       Assessment and plan discussed with with attending physician and they are in agreement.    Etta Quill PA-C [facility] [id number]  09/08/2016, 3:06 PM   Assessment: 82 y.o. male presenting to the emergency department with a subacute left parietal/occipital ischemic infarct. There is association of petechial hemorrhage without frank hemorrhagic transformation. Currently patient has been on low dose of Eliquis.   Stroke Risk Factors - hypertension

## 2016-09-08 NOTE — Progress Notes (Signed)
PROGRESS NOTE    Dylan York  [id number] DOB: Dec 25, 1925 DOA: 09/07/2016 PCP: Glenda Chroman, MD   Brief Narrative: ACHERON WALLACH is a 82 y.o. male, Right-handed male with history of atrial fibrillation on Eliquis and compliant, sinus bradycardia, hypertension, hypothyroidism, glaucoma. He presented with symptoms concerning for stroke found to have a right parietoccipital ischemic infarct   Assessment & Plan:   Principal Problem:   Right homonymous hemianopsia Active Problems:   AF (atrial fibrillation) (HCC)   Hypertensive heart disease   BPH (benign prostatic hyperplasia)   Anticoagulated   Sinus bradycardia, with HR in the 40s BB stopped.   Hypothyroidism   CVA (cerebral vascular accident) ([place])   Right parietoocciptal ischemic infarct Petechial hemorrhage without frank hemorrhagic transformation Symptoms improved -neurology recommendations: aspirin x7 days followed by Eliquis (proper dosing) -atorvastatin -aspirin 325mg   Acute kidney injury Creatinine rising. Likely secondary to dehydration. Patient found to have Grade 1 diastolic dysfunction on echocardiogram -IVF -BMP in AM  Paroxysmal atrial fibrillation Currently sinus rhythm. Recommended to hold for 7 days per neurology secondary to petechial hemorrhage -hold Eliquis x7 days -continue amiodarone  Hypertension -hydralazine and spironolactone held  Hypothyroidism -continue synthroid  Sinus bradycardia Slightly bradycardic   DVT prophylaxis: SCDs Code Status: Full code Family Communication: Wife at bedside Disposition Plan: Anticipate discharge home tomorrow   Consultants:   Neurology  Procedures:  Echocardiogram  Carotid dopplers  Antimicrobials:   None    Subjective: Patient reports feeling almost back to baseline  Objective: Vitals:   09/08/16 0020 09/08/16 0220 09/08/16 1010 09/08/16 1415  BP: (!) 91/51 (!) 103/52 (!) 151/60 110/64  Pulse: (!) 55 (!) 51 (!) 56 (!) 59    Resp: 16 15 20 20   Temp: 98.3 F (36.8 C) 97.8 F (36.6 C) 97.8 F (36.6 C) 98 F (36.7 C)  TempSrc: Oral Oral Oral Oral  SpO2: 95% 94% 97% 94%  Weight:      Height:        Intake/Output Summary (Last 24 hours) at 09/08/16 1658 Last data filed at 09/08/16 1417  Gross per 24 hour  Intake              240 ml  Output                0 ml  Net              240 ml   Filed Weights   09/07/16 1052  Weight: 70.3 kg (155 lb)    Examination:  General exam: Appears calm and comfortable  Respiratory system: Clear to auscultation. Respiratory effort normal. Cardiovascular system: S1 & S2 heard, RRR. No murmurs, rubs, gallops or clicks. Gastrointestinal system: Abdomen is nondistended, soft and nontender. Normal bowel sounds heard. Central nervous system: Alert and oriented. No focal neurological deficits. Mild deficits of right upper vision field. Reflexes equal bilaterally Extremities: No edema. No calf tenderness Skin: No cyanosis. No rashes Psychiatry: Judgement and insight appear normal. Mood & affect appropriate.     Data Reviewed: I have personally reviewed following labs and imaging studies  CBC:  Recent Labs Lab 09/07/16 1201  WBC 5.1  NEUTROABS 3.4  HGB 11.6*  HCT 34.3*  MCV 98.0  PLT [id number]   Basic Metabolic Panel:  Recent Labs Lab 09/07/16 1201 09/08/16 1022 09/08/16 1607  NA 139 138  --   K 4.2 4.3  --   CL 110 108  --   CO2 24 24  --  GLUCOSE 103* 118*  --   BUN 24* 20  --   CREATININE 1.33* 1.44* 1.54*  CALCIUM 8.6* 9.0  --    GFR: Estimated Creatinine Clearance: 32.7 mL/min (by C-G formula based on SCr of 1.54 mg/dL (H)). Liver Function Tests:  Recent Labs Lab 09/07/16 1201  AST 27  ALT 19  ALKPHOS 54  BILITOT 1.2  PROT 6.2*  ALBUMIN 3.7   No results for input(s): LIPASE, AMYLASE in the last 168 hours. No results for input(s): AMMONIA in the last 168 hours. Coagulation Profile:  Recent Labs Lab 09/07/16 1201 09/07/16 2025  INR  1.15 1.14   Cardiac Enzymes: No results for input(s): CKTOTAL, CKMB, CKMBINDEX, TROPONINI in the last 168 hours. BNP (last 3 results) No results for input(s): PROBNP in the last 8760 hours. HbA1C:  Recent Labs  09/07/16 1201  HGBA1C 5.6   CBG: No results for input(s): GLUCAP in the last 168 hours. Lipid Profile:  Recent Labs  09/08/16 0507  CHOL 159  HDL 52  LDLCALC 89  TRIG 92  CHOLHDL 3.1   Thyroid Function Tests:  Recent Labs  09/07/16 1201  TSH 2.173   Anemia Panel: No results for input(s): VITAMINB12, FOLATE, FERRITIN, TIBC, IRON, RETICCTPCT in the last 72 hours. Sepsis Labs: No results for input(s): PROCALCITON, LATICACIDVEN in the last 168 hours.  No results found for this or any previous visit (from the past 240 hour(s)).       Radiology Studies: Ct Head Wo Contrast  Result Date: 09/07/2016 CLINICAL DATA:  Visual changes. EXAM: CT HEAD WITHOUT CONTRAST TECHNIQUE: Contiguous axial images were obtained from the base of the skull through the vertex without intravenous contrast. COMPARISON:  None. FINDINGS: Brain: No evidence of parenchymal hemorrhage or extra-axial fluid collection. Symmetric mild prominence of the CSF space in the bilateral frontal convexities is favored to be due to involutional change. No mass lesion, mass effect, or midline shift. No CT evidence of acute infarction. Intracranial atherosclerosis. Nonspecific moderate subcortical and periventricular white matter hypodensity, most in keeping with chronic small vessel ischemic change. Cerebral volume is age appropriate. No ventriculomegaly. Vascular: No hyperdense vessel or unexpected calcification. Skull: No evidence of calvarial fracture. Sinuses/Orbits: The visualized paranasal sinuses are essentially clear. Other:  The mastoid air cells are unopacified. IMPRESSION: 1.  No evidence of acute intracranial abnormality. 2. Involutional change and moderate chronic small vessel ischemia.  Electronically Signed   By: Ilona Sorrel M.D.   On: 09/07/2016 13:10   Mr Brain Wo Contrast  Result Date: 09/07/2016 CLINICAL DATA:  Initial evaluation for acute right-sided visual defect (homonymous hemianopia), with left upper extremity dysdiadochokinesis. EXAM: MRI HEAD WITHOUT CONTRAST MRA HEAD WITHOUT CONTRAST TECHNIQUE: Multiplanar, multiecho pulse sequences of the brain and surrounding structures were obtained without intravenous contrast. Angiographic images of the head were obtained using MRA technique without contrast. COMPARISON:  Comparison made with prior CT from earlier the same day. FINDINGS: MRI HEAD FINDINGS Brain: Diffuse prominence of the CSF containing spaces is compatible with generalized cerebral atrophy. The patchy and confluent T2/FLAIR hyperintensity within the periventricular and deep white matter both cerebral hemispheres most compatible with moderate chronic microvascular ischemic disease. Chronic small vessel ischemic changes present within the pons as well. There is a small area of patchy restricted diffusion within the cortical gray matter of the left parietal lobe, compatible with acute ischemic infarct (series 4, image 31). Mild linear patchy diffusion abnormality within the subjacent left periatrial white matter (series 4, image 27). No  significant mass effect. Associated petechial hemorrhage on SWI sequence without frank hemorrhagic transformation (series 9, image 64). No other areas of acute or subacute infarct. Gray-white matter differentiation otherwise maintained. No other evidence for acute or chronic intracranial hemorrhage. No other areas of chronic infarction identified. No mass lesion, midline shift, or mass effect. No hydrocephalus. No extra-axial fluid collection. Major dural sinuses are grossly patent. Pituitary gland within normal limits. No sellar expansion. Suprasellar cistern unremarkable. Vascular: Major intracranial vascular flow voids are maintained. Skull  and upper cervical spine: Craniocervical junction within normal limits. Multilevel degenerative spondylolysis noted within the visualized upper cervical spine without significant stenosis. Degenerative thickening of the tectorial membrane noted. Bone marrow signal intensity within normal limits. No scalp soft tissue abnormality. Sinuses/Orbits: Globes and orbital soft tissues within normal limits. Patient is status post cataract extraction bilaterally. Mild scattered mucosal thickening within the ethmoidal air cells right maxillary retention cyst noted. Paranasal sinuses are otherwise clear. No mastoid effusion. Inner ear structures within normal limits. MRA HEAD FINDINGS ANTERIOR CIRCULATION: Distal cervical segments of the internal carotid arteries are patent with antegrade flow. Petrous, cavernous, and supraclinoid segments are widely patent without flow-limiting stenosis. A1 segments widely patent. Anterior communicating normal. Anterior cerebral arteries are widely patent to their distal aspects. M1 segments patent without stenosis or occlusion. No proximal M2 occlusion. Distal MCA branches well opacified and symmetric. POSTERIOR CIRCULATION: Left vertebral artery dominant and widely patent. Right vertebral artery diminutive but patent as well. Partially visualized posterior inferior cerebral arteries patent bilaterally. Basilar artery tortuous but well opacified to its distal aspect. No basilar stenosis. Superior cerebral arteries patent bilaterally. Both of the posterior cerebral artery supplied via the basilar artery and are well opacified to their distal aspects. No aneurysm or vascular malformation. IMPRESSION: MRI HEAD IMPRESSION: 1. Small volume acute cortical and subcortical left parieto-occipital ischemic infarcts as above. Associated petechial hemorrhage without frank hemorrhagic transformation. No associated mass effect. 2. Moderate chronic microvascular ischemic disease. MRA HEAD IMPRESSION: Normal  intracranial MRA for patient age. No large or proximal arterial branch occlusion. No high-grade or correctable stenosis. Electronically Signed   By: Jeannine Boga M.D.   On: 09/07/2016 20:25   Mr Jodene Nam Head/brain [id number] Cm  Result Date: 09/07/2016 CLINICAL DATA:  Initial evaluation for acute right-sided visual defect (homonymous hemianopia), with left upper extremity dysdiadochokinesis. EXAM: MRI HEAD WITHOUT CONTRAST MRA HEAD WITHOUT CONTRAST TECHNIQUE: Multiplanar, multiecho pulse sequences of the brain and surrounding structures were obtained without intravenous contrast. Angiographic images of the head were obtained using MRA technique without contrast. COMPARISON:  Comparison made with prior CT from earlier the same day. FINDINGS: MRI HEAD FINDINGS Brain: Diffuse prominence of the CSF containing spaces is compatible with generalized cerebral atrophy. The patchy and confluent T2/FLAIR hyperintensity within the periventricular and deep white matter both cerebral hemispheres most compatible with moderate chronic microvascular ischemic disease. Chronic small vessel ischemic changes present within the pons as well. There is a small area of patchy restricted diffusion within the cortical gray matter of the left parietal lobe, compatible with acute ischemic infarct (series 4, image 31). Mild linear patchy diffusion abnormality within the subjacent left periatrial white matter (series 4, image 27). No significant mass effect. Associated petechial hemorrhage on SWI sequence without frank hemorrhagic transformation (series 9, image 64). No other areas of acute or subacute infarct. Gray-white matter differentiation otherwise maintained. No other evidence for acute or chronic intracranial hemorrhage. No other areas of chronic infarction identified. No mass lesion, midline shift, or  mass effect. No hydrocephalus. No extra-axial fluid collection. Major dural sinuses are grossly patent. Pituitary gland within normal  limits. No sellar expansion. Suprasellar cistern unremarkable. Vascular: Major intracranial vascular flow voids are maintained. Skull and upper cervical spine: Craniocervical junction within normal limits. Multilevel degenerative spondylolysis noted within the visualized upper cervical spine without significant stenosis. Degenerative thickening of the tectorial membrane noted. Bone marrow signal intensity within normal limits. No scalp soft tissue abnormality. Sinuses/Orbits: Globes and orbital soft tissues within normal limits. Patient is status post cataract extraction bilaterally. Mild scattered mucosal thickening within the ethmoidal air cells right maxillary retention cyst noted. Paranasal sinuses are otherwise clear. No mastoid effusion. Inner ear structures within normal limits. MRA HEAD FINDINGS ANTERIOR CIRCULATION: Distal cervical segments of the internal carotid arteries are patent with antegrade flow. Petrous, cavernous, and supraclinoid segments are widely patent without flow-limiting stenosis. A1 segments widely patent. Anterior communicating normal. Anterior cerebral arteries are widely patent to their distal aspects. M1 segments patent without stenosis or occlusion. No proximal M2 occlusion. Distal MCA branches well opacified and symmetric. POSTERIOR CIRCULATION: Left vertebral artery dominant and widely patent. Right vertebral artery diminutive but patent as well. Partially visualized posterior inferior cerebral arteries patent bilaterally. Basilar artery tortuous but well opacified to its distal aspect. No basilar stenosis. Superior cerebral arteries patent bilaterally. Both of the posterior cerebral artery supplied via the basilar artery and are well opacified to their distal aspects. No aneurysm or vascular malformation. IMPRESSION: MRI HEAD IMPRESSION: 1. Small volume acute cortical and subcortical left parieto-occipital ischemic infarcts as above. Associated petechial hemorrhage without frank  hemorrhagic transformation. No associated mass effect. 2. Moderate chronic microvascular ischemic disease. MRA HEAD IMPRESSION: Normal intracranial MRA for patient age. No large or proximal arterial branch occlusion. No high-grade or correctable stenosis. Electronically Signed   By: Jeannine Boga M.D.   On: 09/07/2016 20:25        Scheduled Meds: .  stroke: mapping our early stages of recovery book   Does not apply Once  . amiodarone  100 mg Oral Daily  . [START ON 09/09/2016] aspirin EC  325 mg Oral Daily  . atorvastatin  20 mg Oral q1800  . brimonidine  1 drop Both Eyes BID  . doxazosin  4 mg Oral Daily  . finasteride  5 mg Oral Daily  . levothyroxine  50 mcg Oral QAC breakfast  . LORazepam  0.5 mg Oral QHS  . timolol  1 drop Both Eyes BID   Continuous Infusions:   LOS: 1 day     Cordelia Poche [facility] 09/08/2016, 4:58 PM Pager: [phone]  If 7PM-7AM, please contact night-coverage [email or web address] Password [email or web address] 09/08/2016, 4:58 PM

## 2016-09-09 DIAGNOSIS — I5032 Chronic diastolic (congestive) heart failure: Secondary | ICD-10-CM

## 2016-09-09 DIAGNOSIS — I63412 Cerebral infarction due to embolism of left middle cerebral artery: Secondary | ICD-10-CM

## 2016-09-09 DIAGNOSIS — E7800 Pure hypercholesterolemia, unspecified: Secondary | ICD-10-CM

## 2016-09-09 DIAGNOSIS — I480 Paroxysmal atrial fibrillation: Secondary | ICD-10-CM

## 2016-09-09 DIAGNOSIS — I10 Essential (primary) hypertension: Secondary | ICD-10-CM

## 2016-09-09 DIAGNOSIS — Z7901 Long term (current) use of anticoagulants: Secondary | ICD-10-CM

## 2016-09-09 LAB — VAS US CAROTID
LCCADSYS: -86 cm/s
LEFT ECA DIAS: -13 cm/s
LEFT VERTEBRAL DIAS: -20 cm/s
LICAPDIAS: -17 cm/s
Left CCA dist dias: -19 cm/s
Left CCA prox dias: -20 cm/s
Left CCA prox sys: -112 cm/s
Left ICA dist dias: -19 cm/s
Left ICA dist sys: -65 cm/s
Left ICA prox sys: -73 cm/s
RCCAPDIAS: 18 cm/s
RIGHT ECA DIAS: -12 cm/s
RIGHT VERTEBRAL DIAS: -11 cm/s
Right CCA prox sys: 67 cm/s
Right cca dist sys: -78 cm/s

## 2016-09-09 LAB — BASIC METABOLIC PANEL
Anion gap: 4 — ABNORMAL LOW (ref 5–15)
BUN: 21 mg/dL — AB (ref 6–20)
CO2: 23 mmol/L (ref 22–32)
CREATININE: 1.33 mg/dL — AB (ref 0.61–1.24)
Calcium: 8.4 mg/dL — ABNORMAL LOW (ref 8.9–10.3)
Chloride: 111 mmol/L (ref 101–111)
GFR, EST AFRICAN AMERICAN: 54 mL/min — AB (ref >60)
GFR, EST NON AFRICAN AMERICAN: 46 mL/min — AB (ref >60)
Glucose, Bld: 95 mg/dL (ref 65–99)
POTASSIUM: 4.3 mmol/L (ref 3.5–5.1)
SODIUM: 138 mmol/L (ref 135–145)

## 2016-09-09 MED ORDER — ASPIRIN 325 MG PO TBEC: 325.0000 mg | DELAYED_RELEASE_TABLET | Freq: Every day | ORAL | 0 refills | Status: AC

## 2016-09-09 MED ORDER — APIXABAN 5 MG PO TABS: 5.0000 mg | ORAL_TABLET | Freq: Two times a day (BID) | ORAL | 0 refills | Status: DC

## 2016-09-09 NOTE — Progress Notes (Signed)
Discharge instructions reviewed with patient and spouse. Emphasis is placed on follow up appointments, and adhering to medication regimen.

## 2016-09-09 NOTE — Discharge Summary (Signed)
Physician Discharge Summary  Dylan York [id number] DOB: 11/06/1925 DOA: 09/07/2016  PCP: Glenda Chroman, MD  Admit date: 09/07/2016 Discharge date: 09/09/2016  Admitted From: Home Disposition:  Home  Recommendations for Outpatient Follow-up:  1. Follow up with PCP in 1 week 2. Recheck BMP in 2-3 days. If trending up, advise increase fluid intake. May also need to readjust Eliquis dosing 3. Eliquis dosing increased to 5mg  twice daily (he does not meet all three criteria for lower dosing) 4. Asprin x7 days followed by restarting Eliquis 5. Neurology/stroke team follow-up  Discharge Condition: Stable CODE STATUS: Full Diet recommendation: Heart healthy   Brief/Interim Summary:  HPI written by Dr. Lala Lund on 09/07/2016   Dylan York  is a 82 y.o. male, Right-handed male with history of atrial fibrillation on Eliquis and compliant, sinus bradycardia, hypertension, hypothyroidism, glaucoma who lives at home and is in very good health comes in after this morning he noticed that his right-sided visual field was blurry in both eyes, this lasted for about 5-6 hours and self resolved, he did not notice any other symptoms. Came to the ER.  In ER he was symptom-free, initial lab work including head CT were nonacute, I was called to admit the patient for stroke/TIA workup. His review of systems currently is entirely negative.  Hospital course:  Right parietoocciptal ischemic infarct Petechial hemorrhage without frank hemorrhagic transformation Symptoms improved. Carotid dopplers 1-39%. Echocardiogram significant for diastolic heart failure (EF of 55-60% and grade 1 diastolic dysfunction). Neurology recommendations: aspirin x7 days followed by Eliquis 5mg  twice daily. Started on atorvastatin. (See below for MRI read)  Acute kidney injury Improved with fluids  Paroxysmal atrial fibrillation Currently sinus rhythm. Recommended to hold Eliquis for 7 days per neurology secondary  to petechial hemorrhage. Continued amiodarone.  Hypertension Hydralazine and spironolactone held to allow for permissive hypertension  Hypothyroidism Continued synthroid  Sinus bradycardia Slightly bradycardic. Asymptomatic  Chronic diastolic heart failure EF 55-60%. Grade 1. Dehydrated.   Discharge Diagnoses:  Principal Problem:   Right homonymous hemianopsia Active Problems:   AF (atrial fibrillation) (HCC)   Hypertensive heart disease   BPH (benign prostatic hyperplasia)   Anticoagulated   Sinus bradycardia, with HR in the 40s BB stopped.   Hypothyroidism   CVA (cerebral vascular accident) [employer])    Discharge Instructions     Medication List    TAKE these medications   amiodarone 100 MG tablet Commonly known as:  PACERONE Take 1 tablet (100 mg total) by mouth daily.   apixaban 5 MG Tabs tablet Commonly known as:  ELIQUIS Take 1 tablet (5 mg total) by mouth 2 (two) times daily. Start taking on:  09/16/2016 What changed:  medication strength  how much to take   aspirin 325 MG EC tablet Take 1 tablet (325 mg total) by mouth daily. Start taking on:  09/10/2016   atorvastatin 20 MG tablet Commonly known as:  LIPITOR Take 1 tablet (20 mg total) by mouth daily at 6 PM.   COMBIGAN 0.2-0.5 % ophthalmic solution Generic drug:  brimonidine-timolol Place 1 drop into both eyes 2 (two) times daily.   doxazosin 4 MG tablet Commonly known as:  CARDURA TAKE 1 TABLET BY MOUTH EVERY DAY   finasteride 5 MG tablet Commonly known as:  PROSCAR Take 5 mg by mouth daily.   hydrALAZINE 50 MG tablet Commonly known as:  APRESOLINE TAKE 1 TABLET BY MOUTH EVERY 8 HOURS   levothyroxine 50 MCG tablet Commonly known as:  SYNTHROID,  LEVOTHROID Take 50 mcg by mouth daily.   LORazepam 1 MG tablet Commonly known as:  ATIVAN TAKE 1/2 TABLET OR 1 TABLET BY MOUTH AT BEDTIME AS NEEDED   spironolactone 25 MG tablet Commonly known as:  ALDACTONE Take 1/2 tablet daily for  for one week. Then increase to 1/2 tablet twice a day.      Follow-up Information    Glenda Chroman, MD. Schedule an appointment as soon as possible for a visit in 2 day(s).   Specialty:  Internal Medicine Why:  Hospital follow-up. Need metabolic panel to recheck kidney function. Contact information: [place] [address] [phone]        SETHI,PRAMOD, MD. Schedule an appointment as soon as possible for a visit in 6 week(s).   Specialties:  Neurology, Radiology Contact information: [address] [place] [address] [phone]          Allergies  Allergen Reactions  . Multaq [Dronedarone] Shortness Of Breath    Consultations:  Neurology   Procedures/Studies: Ct Head Wo Contrast  Result Date: 09/07/2016 CLINICAL DATA:  Visual changes. EXAM: CT HEAD WITHOUT CONTRAST TECHNIQUE: Contiguous axial images were obtained from the base of the skull through the vertex without intravenous contrast. COMPARISON:  None. FINDINGS: Brain: No evidence of parenchymal hemorrhage or extra-axial fluid collection. Symmetric mild prominence of the CSF space in the bilateral frontal convexities is favored to be due to involutional change. No mass lesion, mass effect, or midline shift. No CT evidence of acute infarction. Intracranial atherosclerosis. Nonspecific moderate subcortical and periventricular white matter hypodensity, most in keeping with chronic small vessel ischemic change. Cerebral volume is age appropriate. No ventriculomegaly. Vascular: No hyperdense vessel or unexpected calcification. Skull: No evidence of calvarial fracture. Sinuses/Orbits: The visualized paranasal sinuses are essentially clear. Other:  The mastoid air cells are unopacified. IMPRESSION: 1.  No evidence of acute intracranial abnormality. 2. Involutional change and moderate chronic small vessel ischemia. Electronically Signed   By: Ilona Sorrel M.D.   On: 09/07/2016 13:10   Mr Brain Wo  Contrast  Result Date: 09/07/2016 CLINICAL DATA:  Initial evaluation for acute right-sided visual defect (homonymous hemianopia), with left upper extremity dysdiadochokinesis. EXAM: MRI HEAD WITHOUT CONTRAST MRA HEAD WITHOUT CONTRAST TECHNIQUE: Multiplanar, multiecho pulse sequences of the brain and surrounding structures were obtained without intravenous contrast. Angiographic images of the head were obtained using MRA technique without contrast. COMPARISON:  Comparison made with prior CT from earlier the same day. FINDINGS: MRI HEAD FINDINGS Brain: Diffuse prominence of the CSF containing spaces is compatible with generalized cerebral atrophy. The patchy and confluent T2/FLAIR hyperintensity within the periventricular and deep white matter both cerebral hemispheres most compatible with moderate chronic microvascular ischemic disease. Chronic small vessel ischemic changes present within the pons as well. There is a small area of patchy restricted diffusion within the cortical gray matter of the left parietal lobe, compatible with acute ischemic infarct (series 4, image 31). Mild linear patchy diffusion abnormality within the subjacent left periatrial white matter (series 4, image 27). No significant mass effect. Associated petechial hemorrhage on SWI sequence without frank hemorrhagic transformation (series 9, image 64). No other areas of acute or subacute infarct. Gray-white matter differentiation otherwise maintained. No other evidence for acute or chronic intracranial hemorrhage. No other areas of chronic infarction identified. No mass lesion, midline shift, or mass effect. No hydrocephalus. No extra-axial fluid collection. Major dural sinuses are grossly patent. Pituitary gland within normal limits. No sellar expansion.  Suprasellar cistern unremarkable. Vascular: Major intracranial vascular flow voids are maintained. Skull and upper cervical spine: Craniocervical junction within normal limits. Multilevel  degenerative spondylolysis noted within the visualized upper cervical spine without significant stenosis. Degenerative thickening of the tectorial membrane noted. Bone marrow signal intensity within normal limits. No scalp soft tissue abnormality. Sinuses/Orbits: Globes and orbital soft tissues within normal limits. Patient is status post cataract extraction bilaterally. Mild scattered mucosal thickening within the ethmoidal air cells right maxillary retention cyst noted. Paranasal sinuses are otherwise clear. No mastoid effusion. Inner ear structures within normal limits. MRA HEAD FINDINGS ANTERIOR CIRCULATION: Distal cervical segments of the internal carotid arteries are patent with antegrade flow. Petrous, cavernous, and supraclinoid segments are widely patent without flow-limiting stenosis. A1 segments widely patent. Anterior communicating normal. Anterior cerebral arteries are widely patent to their distal aspects. M1 segments patent without stenosis or occlusion. No proximal M2 occlusion. Distal MCA branches well opacified and symmetric. POSTERIOR CIRCULATION: Left vertebral artery dominant and widely patent. Right vertebral artery diminutive but patent as well. Partially visualized posterior inferior cerebral arteries patent bilaterally. Basilar artery tortuous but well opacified to its distal aspect. No basilar stenosis. Superior cerebral arteries patent bilaterally. Both of the posterior cerebral artery supplied via the basilar artery and are well opacified to their distal aspects. No aneurysm or vascular malformation. IMPRESSION: MRI HEAD IMPRESSION: 1. Small volume acute cortical and subcortical left parieto-occipital ischemic infarcts as above. Associated petechial hemorrhage without frank hemorrhagic transformation. No associated mass effect. 2. Moderate chronic microvascular ischemic disease. MRA HEAD IMPRESSION: Normal intracranial MRA for patient age. No large or proximal arterial branch occlusion.  No high-grade or correctable stenosis. Electronically Signed   By: Jeannine Boga M.D.   On: 09/07/2016 20:25   Mr Jodene Nam Head/brain [id number] Cm  Result Date: 09/07/2016 CLINICAL DATA:  Initial evaluation for acute right-sided visual defect (homonymous hemianopia), with left upper extremity dysdiadochokinesis. EXAM: MRI HEAD WITHOUT CONTRAST MRA HEAD WITHOUT CONTRAST TECHNIQUE: Multiplanar, multiecho pulse sequences of the brain and surrounding structures were obtained without intravenous contrast. Angiographic images of the head were obtained using MRA technique without contrast. COMPARISON:  Comparison made with prior CT from earlier the same day. FINDINGS: MRI HEAD FINDINGS Brain: Diffuse prominence of the CSF containing spaces is compatible with generalized cerebral atrophy. The patchy and confluent T2/FLAIR hyperintensity within the periventricular and deep white matter both cerebral hemispheres most compatible with moderate chronic microvascular ischemic disease. Chronic small vessel ischemic changes present within the pons as well. There is a small area of patchy restricted diffusion within the cortical gray matter of the left parietal lobe, compatible with acute ischemic infarct (series 4, image 31). Mild linear patchy diffusion abnormality within the subjacent left periatrial white matter (series 4, image 27). No significant mass effect. Associated petechial hemorrhage on SWI sequence without frank hemorrhagic transformation (series 9, image 64). No other areas of acute or subacute infarct. Gray-white matter differentiation otherwise maintained. No other evidence for acute or chronic intracranial hemorrhage. No other areas of chronic infarction identified. No mass lesion, midline shift, or mass effect. No hydrocephalus. No extra-axial fluid collection. Major dural sinuses are grossly patent. Pituitary gland within normal limits. No sellar expansion. Suprasellar cistern unremarkable. Vascular: Major  intracranial vascular flow voids are maintained. Skull and upper cervical spine: Craniocervical junction within normal limits. Multilevel degenerative spondylolysis noted within the visualized upper cervical spine without significant stenosis. Degenerative thickening of the tectorial membrane noted. Bone marrow signal intensity within normal limits. No scalp soft  tissue abnormality. Sinuses/Orbits: Globes and orbital soft tissues within normal limits. Patient is status post cataract extraction bilaterally. Mild scattered mucosal thickening within the ethmoidal air cells right maxillary retention cyst noted. Paranasal sinuses are otherwise clear. No mastoid effusion. Inner ear structures within normal limits. MRA HEAD FINDINGS ANTERIOR CIRCULATION: Distal cervical segments of the internal carotid arteries are patent with antegrade flow. Petrous, cavernous, and supraclinoid segments are widely patent without flow-limiting stenosis. A1 segments widely patent. Anterior communicating normal. Anterior cerebral arteries are widely patent to their distal aspects. M1 segments patent without stenosis or occlusion. No proximal M2 occlusion. Distal MCA branches well opacified and symmetric. POSTERIOR CIRCULATION: Left vertebral artery dominant and widely patent. Right vertebral artery diminutive but patent as well. Partially visualized posterior inferior cerebral arteries patent bilaterally. Basilar artery tortuous but well opacified to its distal aspect. No basilar stenosis. Superior cerebral arteries patent bilaterally. Both of the posterior cerebral artery supplied via the basilar artery and are well opacified to their distal aspects. No aneurysm or vascular malformation. IMPRESSION: MRI HEAD IMPRESSION: 1. Small volume acute cortical and subcortical left parieto-occipital ischemic infarcts as above. Associated petechial hemorrhage without frank hemorrhagic transformation. No associated mass effect. 2. Moderate chronic  microvascular ischemic disease. MRA HEAD IMPRESSION: Normal intracranial MRA for patient age. No large or proximal arterial branch occlusion. No high-grade or correctable stenosis. Electronically Signed   By: Jeannine Boga M.D.   On: 09/07/2016 20:25     Echocardiogram (09/08/2016)  Study Conclusions  - Left ventricle: The cavity size was normal. There was mild focal   basal hypertrophy of the septum. Systolic function was normal.   The estimated ejection fraction was in the range of 55% to 60%.   Wall motion was normal; there were no regional wall motion   abnormalities. Doppler parameters are consistent with abnormal   left ventricular relaxation (grade 1 diastolic dysfunction). - Aortic valve: Trileaflet; mildly thickened, mildly calcified   leaflets. There was mild regurgitation. - Mitral valve: Calcified annulus. - Left atrium: The atrium was moderately dilated. Volume/bsa, S:   40.4 ml/m^2. - Right ventricle: The cavity size was mildly dilated. Wall   thickness was normal. Systolic function was mildly reduced. - Right atrium: The atrium was moderately dilated. - Tricuspid valve: There was moderate regurgitation. - Pulmonary arteries: Systolic pressure was moderately increased.   PA peak pressure: 55 mm Hg (S).  Impressions:  - Compared to the prior study, there has been no significant   interval change.   Subjective: Patient reports no issues overnight. Urine is clearer.  Discharge Exam: Vitals:   09/09/16 1000 09/09/16 1329  BP: 123/64 125/70  Pulse: (!) 49 (!) 58  Resp: 20 18  Temp: 97.6 F (36.4 C) 97.9 F (36.6 C)   Vitals:   09/09/16 0051 09/09/16 0523 09/09/16 1000 09/09/16 1329  BP: 120/62 122/63 123/64 125/70  Pulse: (!) 51 (!) 53 (!) 49 (!) 58  Resp: 18 18 20 18   Temp: 97.5 F (36.4 C) 98 F (36.7 C) 97.6 F (36.4 C) 97.9 F (36.6 C)  TempSrc: Oral Oral Oral Oral  SpO2: 98% 96% 97% 96%  Weight:      Height:        General exam:  Appears calm and comfortable  Respiratory system: Clear to auscultation. Respiratory effort normal. Cardiovascular system: S1 & S2 heard, RRR. No murmurs, rubs, gallops or clicks. Gastrointestinal system: Abdomen is nondistended, soft and nontender. Normal bowel sounds heard. Central nervous system: Alert and oriented.  No focal neurological deficits. Mild deficits of right upper vision field. Reflexes equal bilaterally Extremities: No edema. No calf tenderness Skin: No cyanosis. No rashes Psychiatry: Judgement and insight appear normal. Mood & affect appropriate.    The results of significant diagnostics from this hospitalization (including imaging, microbiology, ancillary and laboratory) are listed below for reference.     Microbiology: No results found for this or any previous visit (from the past 240 hour(s)).   Labs: BNP (last 3 results) No results for input(s): BNP in the last 8760 hours. Basic Metabolic Panel:  Recent Labs Lab 09/07/16 1201 09/08/16 1022 09/08/16 1607 09/09/16 0430  NA 139 138  --  138  K 4.2 4.3  --  4.3  CL 110 108  --  111  CO2 24 24  --  23  GLUCOSE 103* 118*  --  95  BUN 24* 20  --  21*  CREATININE 1.33* 1.44* 1.54* 1.33*  CALCIUM 8.6* 9.0  --  8.4*   Liver Function Tests:  Recent Labs Lab 09/07/16 1201  AST 27  ALT 19  ALKPHOS 54  BILITOT 1.2  PROT 6.2*  ALBUMIN 3.7   No results for input(s): LIPASE, AMYLASE in the last 168 hours. No results for input(s): AMMONIA in the last 168 hours. CBC:  Recent Labs Lab 09/07/16 1201  WBC 5.1  NEUTROABS 3.4  HGB 11.6*  HCT 34.3*  MCV 98.0  PLT 159   Cardiac Enzymes: No results for input(s): CKTOTAL, CKMB, CKMBINDEX, TROPONINI in the last 168 hours. BNP: Invalid input(s): POCBNP CBG: No results for input(s): GLUCAP in the last 168 hours. D-Dimer No results for input(s): DDIMER in the last 72 hours. Hgb A1c  Recent Labs  09/07/16 1201  HGBA1C 5.6   Lipid Profile  Recent  Labs  09/08/16 0507  CHOL 159  HDL 52  LDLCALC 89  TRIG 92  CHOLHDL 3.1   Thyroid function studies  Recent Labs  09/07/16 1201  TSH 2.173   Anemia work up No results for input(s): VITAMINB12, FOLATE, FERRITIN, TIBC, IRON, RETICCTPCT in the last 72 hours. Urinalysis    Component Value Date/Time   COLORURINE YELLOW 09/07/2016 1143   APPEARANCEUR CLEAR 09/07/2016 1143   LABSPEC 1.010 09/07/2016 1143   PHURINE 6.0 09/07/2016 1143   GLUCOSEU NEGATIVE 09/07/2016 1143   HGBUR NEGATIVE 09/07/2016 1143   BILIRUBINUR NEGATIVE 09/07/2016 1143   KETONESUR NEGATIVE 09/07/2016 1143   PROTEINUR NEGATIVE 09/07/2016 1143   NITRITE NEGATIVE 09/07/2016 1143   LEUKOCYTESUR NEGATIVE 09/07/2016 1143   Sepsis Labs Invalid input(s): PROCALCITONIN,  WBC,  LACTICIDVEN Microbiology No results found for this or any previous visit (from the past 240 hour(s)).   Time coordinating discharge: Over 30 minutes  SIGNED:   Cordelia Poche, MD [facility] 09/09/2016, 3:02 PM Pager [phone]  If 7PM-7AM, please contact night-coverage [email or web address] Password [email or web address]

## 2016-09-09 NOTE — Care Management Note (Signed)
Case Management Note  Patient Details  Name: Dylan York MRN: [id number] Date of Birth: 07/08/1926  Subjective/Objective:                    Action/Plan: Pt discharging home with wife. PT recommending outpatient PT. MD provided the patient with a prescription for the outpatient therapy. PT's wife already in outpatient therapy and pt wants to attend the same facility.   Expected Discharge Date:                  Expected Discharge Plan:  Home/Self Care  In-House Referral:     Discharge planning Services  CM Consult  Post Acute Care Choice:    Choice offered to:     DME Arranged:    DME Agency:     HH Arranged:    [place] Agency:     Status of Service:  Completed, signed off  If discussed at [employer] of Stay Meetings, dates discussed:    Additional Comments:  Pollie Friar, RN 09/09/2016, 4:03 PM

## 2016-09-09 NOTE — Discharge Instructions (Signed)
Dylan York, you were in the hospital because of a stroke. We have adjusted your medications and you should follow-up with our stroke team as an outpatient. Please follow-up with your primary care physician. You have been changed to aspirin for 7 days before being switched to Eliquis 5mg  twice daily. Your kidney function worsened slightly but improved overnight with hydration. Please follow-up with your primary care physician to get this rechecked.

## 2016-09-09 NOTE — Progress Notes (Signed)
STROKE TEAM PROGRESS NOTE   HISTORY OF PRESENT ILLNESS (per record) Dylan York is an 82 y.o. male with history of atrial fibrillation on Eliquis 2.5 bid. Patient was doing well until yesterday morning 09/07/2016 when he noticed that his right visual field was blurry in both eyes. This lasted for about 5-6 hours and then self resolved. Patient had no other symptoms. Patient came to the the ER to be further evaluated. Currently patient is asymptomatic. Results from hospitalization stroke workup are as below. Patient states he has not missed any doses of Eliquis.  Patient was not administered IV t-PA secondary to resolved symptoms. He was admitted for further evaluation and treatment.   SUBJECTIVE (INTERVAL HISTORY) Wife at bedside. Pt back to his baseline. He did have right hemianopia for 5-6 hours. His eliquis at 2.5mg  bid is under dosed. Recommend ASA for 7 days due to hemorrhagic transformation and then eliquis 5 mg bid.    OBJECTIVE Temp:  [97.5 F (36.4 C)-98 F (36.7 C)] 98 F (36.7 C) (11/07 0523) Pulse Rate:  [49-59] 53 (11/07 0523) Cardiac Rhythm: Heart block (11/07 0700) Resp:  [18-20] 18 (11/07 0523) BP: (110-151)/(60-74) 122/63 (11/07 0523) SpO2:  [94 %-98 %] 96 % (11/07 0523)  CBC:  Recent Labs Lab 09/07/16 1201  WBC 5.1  NEUTROABS 3.4  HGB 11.6*  HCT 34.3*  MCV 98.0  PLT [id number]    Basic Metabolic Panel:  Recent Labs Lab 09/08/16 1022 09/08/16 1607 09/09/16 0430  NA 138  --  138  K 4.3  --  4.3  CL 108  --  111  CO2 24  --  23  GLUCOSE 118*  --  95  BUN 20  --  21*  CREATININE 1.44* 1.54* 1.33*  CALCIUM 9.0  --  8.4*    Lipid Panel:    Component Value Date/Time   CHOL 159 09/08/2016 0507   CHOL 161 06/05/2016 0807   TRIG 92 09/08/2016 0507   HDL 52 09/08/2016 0507   HDL 68 06/05/2016 0807   CHOLHDL 3.1 09/08/2016 0507   VLDL 18 09/08/2016 0507   LDLCALC 89 09/08/2016 0507   LDLCALC 77 06/05/2016 0807   HgbA1c:  Lab Results  Component Value  Date   HGBA1C 5.6 09/07/2016   Urine Drug Screen: No results found for: LABOPIA, COCAINSCRNUR, LABBENZ, AMPHETMU, THCU, LABBARB    IMAGING I have personally reviewed the radiological images below and agree with the radiology interpretations.  Ct Head Wo Contrast 09/07/2016 1.  No evidence of acute intracranial abnormality. 2. Involutional change and moderate chronic small vessel ischemia.   MRI HEAD  09/07/2016 1. Small volume acute cortical and subcortical left parieto-occipital ischemic infarcts as above. Associated petechial hemorrhage without frank hemorrhagic transformation. No associated mass effect. 2. Moderate chronic microvascular ischemic disease.   MRA HEAD  09/07/2016 Normal intracranial MRA for patient age. No large or proximal arterial branch occlusion. No high-grade or correctable stenosis.   Carotid Doppler   There is 1-39% bilateral ICA stenosis. Vertebral artery flow is antegrade.   2D Echocardiogram  - Left ventricle: The cavity size was normal. There was mild focal basal hypertrophy of the septum. Systolic function was normal. The estimated ejection fraction was in the range of 55% to 60%. Wall motion was normal; there were no regional wall motion abnormalities. Doppler parameters are consistent with abnormal left ventricular relaxation (grade 1 diastolic dysfunction). - Aortic valve: Trileaflet; mildly thickened, mildly calcified leaflets. There was mild regurgitation. - Mitral valve:  Calcified annulus. - Left atrium: The atrium was moderately dilated. Volume/bsa, S: 40.4 ml/m^2. - Right ventricle: The cavity size was mildly dilated. Wall thickness was normal. Systolic function was mildly reduced. - Right atrium: The atrium was moderately dilated. - Tricuspid valve: There was moderate regurgitation. - Pulmonary arteries: Systolic pressure was moderately increased. PA peak pressure: 55 mm Hg (S). Impressions:  Compared to the prior study, there has been no  significant interval change.   PHYSICAL EXAM  Temp:  [97.5 F (36.4 C)-98 F (36.7 C)] 97.9 F (36.6 C) (11/07 1329) Pulse Rate:  [49-58] 58 (11/07 1329) Resp:  [18-20] 18 (11/07 1329) BP: (120-125)/(62-70) 125/70 (11/07 1329) SpO2:  [96 %-98 %] 96 % (11/07 1329)  General - Well nourished, well developed, in no apparent distress.  Ophthalmologic - Sharp disc margins OU.   Cardiovascular - Regular rate and rhythm.  Mental Status -  Level of arousal and orientation to time, place, and person were intact. Language including expression, naming, repetition, comprehension was assessed and found intact. Attention span and concentration were normal. Fund of Knowledge was assessed and was intact.  Cranial Nerves II - XII - II - Visual field intact OU. III, IV, VI - Extraocular movements intact. V - Facial sensation intact bilaterally. VII - Facial movement intact bilaterally. VIII - Hearing & vestibular intact bilaterally. X - Palate elevates symmetrically. XI - Chin turning & shoulder shrug intact bilaterally. XII - Tongue protrusion intact.  Motor Strength - The patient's strength was normal in all extremities and pronator drift was absent.  Bulk was normal and fasciculations were absent.   Motor Tone - Muscle tone was assessed at the neck and appendages and was normal.  Reflexes - The patient's reflexes were 1+ in all extremities and he had no pathological reflexes.  Sensory - Light touch, temperature/pinprick were assessed and were symmetrical.    Coordination - The patient had normal movements in the hands with no ataxia or dysmetria.  Tremor was absent.  Gait and Station - The patient's transfers, posture, gait, station, and turns were observed as normal.   ASSESSMENT/PLAN Dylan York is a 82 y.o. male with history of AF on eliquis, sinus bradycardia, HTN, hypothyroidism and glaucoma presenting with transient blurry vision. He did not receive IV t-PA due to  resolved symptoms on Eliquis.   Stroke:  Small right cortical and subcortical L parieto-occipital infarcts, embolic secondary to known atrial fibrillation with under dosed eliquis  MRI  Small L parieto-occipital cortical and subcortical ischemic infarcts with petechiae hemorrhagic transformation  MRA  Unremarkable   Carotid Doppler  No significant stenosis   2D Echo  EF 55-60%. No source of embolus   LDL 89  HgbA1c 5.6  SCDs for VTE prophylaxis  Diet Heart Room service appropriate? Yes; Fluid consistency: Thin Eliquis (apixaban) daily prior to admission, now on aspirin 325 mg daily. Recommend ASA for 7 days due to hemorrhagic transformation and then eliquis 5 mg bid.   Patient counseled to be compliant with his antithrombotic medications  Ongoing aggressive stroke risk factor management  Therapy recommendations:  OP PT, no OT, supervision  Disposition:  pending   Atrial Fibrillation  Home anticoagulation:  Eliquis (apixaban) daily 2.5 mg bid  Based on dosing, could increase eliquis to 5 mg daily in 7 days.   Follow up with PCP for Cre monitoring, if Cre constantly more than 1.5, may consider dosing change or change to other DOACs.     Hypertension  Stable  Permissive hypertension (OK if < 220/120) but gradually normalize in 5-7 days  Long-term BP goal normotensive  Hyperlipidemia  Home meds:  No statin  LDL 89, goal < 70  Add lipitor 20mg   Continue statin at discharge  Other Stroke Risk Factors  Advanced age  Other Active Problems  BPH  GERD  Glaucoma  Kidney cyst  Hospital day # 2  Neurology will sign off. Please call with questions. Pt will follow up with Dr. Erlinda Hong at [facility] in about 6 weeks. Thanks for the consult.  Rosalin Hawking, MD PhD Stroke Neurology 09/10/2016 12:02 AM   To contact Stroke Continuity provider, please refer to [email or web address] After hours, contact General Neurology

## 2016-09-09 NOTE — Evaluation (Signed)
Speech Language Pathology Evaluation Patient Details Name: Dylan York MRN: [id number] DOB: May 22, 1926 Today's Date: 09/09/2016 Time: [id number] SLP Time Calculation (min) (ACUTE ONLY): 25 min  Problem List:  Patient Active Problem List   Diagnosis Date Noted  . Right homonymous hemianopsia 09/07/2016  . CVA (cerebral vascular accident) ([place]) 09/07/2016  . Hypothyroidism 09/26/2014  . Bilateral renal cysts 01/17/2014  . Lower extremity edema 08/23/2013  . PAF (paroxysmal atrial fibrillation) ([place]) 08/08/2013  . Sinus bradycardia, with HR in the 40s BB stopped. 08/08/2013  . Labile hypertension 08/07/2013  . Hypertensive crisis 08/05/2013  . Anticoagulated 05/05/2013  . AF (atrial fibrillation) ([place]) 04/04/2013  . Hypertensive heart disease 04/04/2013  . BPH (benign prostatic hyperplasia) 04/04/2013  . GERD (gastroesophageal reflux disease) 04/04/2013  . Sick sinus syndrome ([place]) 04/04/2013   Past Medical History:  Past Medical History:  Diagnosis Date  . Atrial tachycardia ([place])   . BPH (benign prostatic hyperplasia)   . GERD (gastroesophageal reflux disease)   . Glaucoma   . Hypertension   . Pneumonia   . Sinus bradycardia, with HR in the 40s BB stopped. 08/08/2013  . Thyroid disease   . Tricuspid regurgitation    Past Surgical History:  Past Surgical History:  Procedure Laterality Date  . APPENDECTOMY    . BACTERIAL OVERGROWTH TEST N/A 01/14/2016   Procedure: BACTERIAL OVERGROWTH TEST;  Surgeon: Rogene Houston, MD;  Location: [facility];  Service: Endoscopy;  Laterality: N/A;  730  . HERNIA REPAIR    . PROSTATE SURGERY     HPI:  Patient is a 82 y.o. male,Right-handed male with history of atrial fibrillation on Eliquis and compliant, sinus bradycardia, hypertension, hypothyroidism, glaucoma. He presented with symptoms concerning for stroke found to have a left parietoccipital ischemic infarct   Assessment / Plan / Recommendation Clinical Impression  Patient  assessed for cognitive-linguistic skills using the [facility] Cognitive Assessment 8.1. Patient scored 25 points out of 30, with 26 points or above considered normal. Other than mild problem-solving impairment for visuospatial reasoning, patinet's cognition appeared [facility], including short term recall, language, and abstract reasoning. Though the patient scored slightly below the cutoff of normal, both patient and wife report that his thinking skills are at previous baseline of function. Follow up with SLP not warranted at this time. Educated both patient and wife of findings and recommendations with no questions at this time.     SLP Assessment  Patient does not need any further Speech Lanaguage Pathology Services    Follow Up Recommendations  None    Frequency and Duration  N/A         SLP Evaluation Cognition  Overall Cognitive Status: Within Functional Limits for tasks assessed       Comprehension  Auditory Comprehension Overall Auditory Comprehension: Appears within functional limits for tasks assessed Visual Recognition/Discrimination Discrimination: Within Function Limits Reading Comprehension Reading Status: Not tested    Expression Expression Primary Mode of Expression: Verbal Verbal Expression Overall Verbal Expression: Appears within functional limits for tasks assessed Written Expression Dominant Hand: Right Written Expression: Within Functional Limits   Oral / Motor  Oral Motor/Sensory Function Overall Oral Motor/Sensory Function: Within functional limits   GO                    Thornton Papas 09/09/2016, 11:53 AM

## 2016-09-10 ENCOUNTER — Other Ambulatory Visit: Payer: Self-pay | Admitting: Neurology

## 2016-09-10 DIAGNOSIS — I63412 Cerebral infarction due to embolism of left middle cerebral artery: Secondary | ICD-10-CM

## 2016-09-11 DIAGNOSIS — N401 Enlarged prostate with lower urinary tract symptoms: Secondary | ICD-10-CM | POA: Diagnosis not present

## 2016-09-11 DIAGNOSIS — I639 Cerebral infarction, unspecified: Secondary | ICD-10-CM | POA: Diagnosis not present

## 2016-09-11 DIAGNOSIS — D649 Anemia, unspecified: Secondary | ICD-10-CM | POA: Diagnosis not present

## 2016-09-11 DIAGNOSIS — N289 Disorder of kidney and ureter, unspecified: Secondary | ICD-10-CM | POA: Diagnosis not present

## 2016-09-11 DIAGNOSIS — Z299 Encounter for prophylactic measures, unspecified: Secondary | ICD-10-CM | POA: Diagnosis not present

## 2016-09-11 DIAGNOSIS — I5032 Chronic diastolic (congestive) heart failure: Secondary | ICD-10-CM

## 2016-09-11 NOTE — Progress Notes (Signed)
LATE ENTRY The writer of this note took care of Mr Dylan York on Sunday night (09/07/16). This patient was scheduled to have 0.5mg  ativan but when the writer pulled the medication from the pyxis to be given to the patient, he refused to take and hence the documentation "not given" was charted.The Probation officer (RN who took care of this patient) left 0300am and so forgot to return the medication back to the pyxis.  RN did not actually work until 09/11/16 when found out that the 0.5mg  ativan is still in the pocket but was actually working on [employer] hospital that night.  [facility] to let a pharmacist know about the situation. RN was directed what to do. Therefore the 0.5mg  ativan is intact but patient has been discharged so had to waste with a witness....Marland KitchenMarland KitchenThe night supervisor on [facility] ( Jenetta Downer, RN).

## 2016-09-15 ENCOUNTER — Other Ambulatory Visit: Payer: Self-pay | Admitting: Cardiovascular Disease

## 2016-09-16 ENCOUNTER — Telehealth: Payer: Self-pay | Admitting: *Deleted

## 2016-09-16 DIAGNOSIS — G8190 Hemiplegia, unspecified affecting unspecified side: Secondary | ICD-10-CM | POA: Diagnosis not present

## 2016-09-16 NOTE — Telephone Encounter (Signed)
Prior authorization for patient's amiodarone has been sent to [employer] via covermy meds.

## 2016-09-17 DIAGNOSIS — G8190 Hemiplegia, unspecified affecting unspecified side: Secondary | ICD-10-CM | POA: Diagnosis not present

## 2016-09-18 ENCOUNTER — Other Ambulatory Visit: Payer: Self-pay | Admitting: Cardiovascular Disease

## 2016-09-18 ENCOUNTER — Telehealth: Payer: Self-pay | Admitting: Cardiovascular Disease

## 2016-09-18 NOTE — Telephone Encounter (Signed)
Pt is calling have questions about his medication

## 2016-09-18 NOTE — Telephone Encounter (Signed)
Spoke with Dylan York states that he just wanted to let Dr Claiborne Billings know that he had a stroke and was in [facility] hospital 09-07-16 thru 09-09-16. He states that he just wants to keep you updated, and that they changed his medications. 1-Eliquis up to 5mg  twice daily.  2-stopped Lipitor and 3- Amiodaron is decreased from 200mg  to 100mg . He states that this is just FYI and that he is doing great!

## 2016-09-19 NOTE — Telephone Encounter (Signed)
Rx(s) sent to pharmacy electronically.  

## 2016-09-22 DIAGNOSIS — G8190 Hemiplegia, unspecified affecting unspecified side: Secondary | ICD-10-CM | POA: Diagnosis not present

## 2016-09-28 NOTE — Telephone Encounter (Signed)
thx for letting me know 

## 2016-09-30 NOTE — Addendum Note (Signed)
Addended by: Waylan Rocher on: 09/30/2016 07:41 AM   Modules accepted: Orders

## 2016-10-01 DIAGNOSIS — G8190 Hemiplegia, unspecified affecting unspecified side: Secondary | ICD-10-CM | POA: Diagnosis not present

## 2016-10-02 ENCOUNTER — Ambulatory Visit (INDEPENDENT_AMBULATORY_CARE_PROVIDER_SITE_OTHER): Payer: Medicare Other | Admitting: Neurology

## 2016-10-02 ENCOUNTER — Encounter: Payer: Self-pay | Admitting: Neurology

## 2016-10-02 VITALS — BP 117/67 | HR 58 | Ht 68.0 in | Wt 155.0 lb

## 2016-10-02 DIAGNOSIS — I63412 Cerebral infarction due to embolism of left middle cerebral artery: Secondary | ICD-10-CM | POA: Diagnosis not present

## 2016-10-02 DIAGNOSIS — I639 Cerebral infarction, unspecified: Secondary | ICD-10-CM

## 2016-10-02 DIAGNOSIS — I480 Paroxysmal atrial fibrillation: Secondary | ICD-10-CM | POA: Diagnosis not present

## 2016-10-02 DIAGNOSIS — E785 Hyperlipidemia, unspecified: Secondary | ICD-10-CM

## 2016-10-02 DIAGNOSIS — I10 Essential (primary) hypertension: Secondary | ICD-10-CM

## 2016-10-02 DIAGNOSIS — Z7901 Long term (current) use of anticoagulants: Secondary | ICD-10-CM | POA: Diagnosis not present

## 2016-10-02 MED ORDER — ATORVASTATIN CALCIUM 20 MG PO TABS: 20.0000 mg | ORAL_TABLET | Freq: Every day | ORAL | Status: DC

## 2016-10-02 NOTE — Progress Notes (Signed)
STROKE NEUROLOGY FOLLOW UP NOTE  NAME: JERMINE EADIE DOB: 08/27/1926  REASON FOR VISIT: stroke follow up HISTORY FROM: pt and chart  Today we had the pleasure of seeing TREVONN CARDULLO in follow-up at our Neurology Clinic. Pt was accompanied by wife.   History Summary Mr. TRYSTEN PRESUTTI is a 82 y.o. male with history of AF on eliquis 2.5mg  bid, sinus bradycardia, HTN, hypothyroidism and glaucoma admitted on 09/07/16 for transient right hemianopia for 5-6 hours. MRI showed small right cortical and subcortical left parieto-occipital infarcts with petechial hemorrhagic transformation. MRA, CUS and TTE unremarkable. LEL 89 and A1C 5.6. His Cre 1.33, and his eliquis was underdosed. Due to petechial hemorrhage, he was put on ASA for 7 days before starting eliquis 5mg  bid. He was also discharged with lipitor 20mg .   Interval History During the interval time, the patient has been doing well. He is on eliquis 5mg  bid and lipitor without side effect. Followed with PCP and recent renal function check was told normal. BP today 117/61, well controlled at home. No complains. Currently is ready to be discharged from PT/OT.   REVIEW OF SYSTEMS: Full 14 system review of systems performed and notable only for those listed below and in HPI above, all others are negative:  Constitutional:   Cardiovascular:  Ear/Nose/Throat:   Skin:  Eyes:   Respiratory:   Gastroitestinal:  constipation Genitourinary:  Hematology/Lymphatic:   Endocrine:  Musculoskeletal:   Allergy/Immunology:   Neurological:   Psychiatric:  Sleep: frequent waking  The following represents the patient's updated allergies and side effects list: Allergies  Allergen Reactions  . Multaq [Dronedarone] Shortness Of Breath    The neurologically relevant items on the patient's problem list were reviewed on today's visit.  Neurologic Examination  A problem focused neurological exam (12 or more points of the single system neurologic  examination, vital signs counts as 1 point, cranial nerves count for 8 points) was performed.  Blood pressure 117/67, pulse (!) 58, height 5\' 8"  (1.727 m), weight 155 lb (70.3 kg).  General - Well nourished, well developed, in no apparent distress.  Ophthalmologic - Sharp disc margins OU.   Cardiovascular - Regular rate and rhythm.  Mental Status -  Level of arousal and orientation to time, place, and person were intact. Language including expression, naming, repetition, comprehension was assessed and found intact. Attention span and concentration were normal. Fund of Knowledge was assessed and was intact.  Cranial Nerves II - XII - II - Visual field intact OU. III, IV, VI - Extraocular movements intact. V - Facial sensation intact bilaterally. VII - Facial movement intact bilaterally. VIII - Hearing & vestibular intact bilaterally. X - Palate elevates symmetrically. XI - Chin turning & shoulder shrug intact bilaterally. XII - Tongue protrusion intact.  Motor Strength - The patient's strength was normal in all extremities and pronator drift was absent.  Bulk was normal and fasciculations were absent.   Motor Tone - Muscle tone was assessed at the neck and appendages and was normal.  Reflexes - The patient's reflexes were 1+ in all extremities and he had no pathological reflexes.  Sensory - Light touch, temperature/pinprick were assessed and were symmetrical.    Coordination - The patient had normal movements in the hands with no ataxia or dysmetria.  Tremor was absent.  Gait and Station - The patient's transfers, posture, gait, station, and turns were observed as normal.   Functional score  mRS = 0   0 - No  symptoms.   1 - No significant disability. Able to carry out all usual activities, despite some symptoms.   2 - Slight disability. Able to look after own affairs without assistance, but unable to carry out all previous activities.   3 - Moderate  disability. Requires some help, but able to walk unassisted.   4 - Moderately severe disability. Unable to attend to own bodily needs without assistance, and unable to walk unassisted.   5 - Severe disability. Requires constant nursing care and attention, bedridden, incontinent.   6 - Dead.   NIH Stroke Scale = 0   Data reviewed: I personally reviewed the images and agree with the radiology interpretations.  Ct Head Wo Contrast 09/07/2016 1.  No evidence of acute intracranial abnormality. 2. Involutional change and moderate chronic small vessel ischemia.   MRI HEAD  09/07/2016 1. Small volume acute cortical and subcortical left parieto-occipital ischemic infarcts as above. Associated petechial hemorrhage without frank hemorrhagic transformation. No associated mass effect. 2. Moderate chronic microvascular ischemic disease.   MRA HEAD  09/07/2016 Normal intracranial MRA for patient age. No large or proximal arterial branch occlusion. No high-grade or correctable stenosis.   Carotid Doppler   There is 1-39% bilateral ICA stenosis. Vertebral artery flow is antegrade.   2D Echocardiogram  - Left ventricle: The cavity size was normal. There was mild focalbasal hypertrophy of the septum. Systolic function was normal.The estimated ejection fraction was in the range of 55% to 60%.Wall motion was normal; there were no regional wall motionabnormalities. Doppler parameters are consistent with abnormalleft ventricular relaxation (grade 1 diastolic dysfunction). - Aortic valve: Trileaflet; mildly thickened, mildly calcifiedleaflets. There was mild regurgitation. - Mitral valve: Calcified annulus. - Left atrium: The atrium was moderately dilated. Volume/bsa, S:40.4 ml/m^2. - Right ventricle: The cavity size was mildly dilated. Wallthickness was normal. Systolic function was mildly reduced. - Right atrium: The atrium was moderately dilated. - Tricuspid valve: There was moderate  regurgitation. - Pulmonary arteries: Systolic pressure was moderately increased.PA peak pressure: 55 mm Hg (S). Impressions:  Compared to the prior study, there has been no significantinterval change.  Component     Latest Ref Rng & Units 09/07/2016 09/08/2016  Cholesterol     0 - 200 mg/dL  159  Triglycerides     <150 mg/dL  92  HDL Cholesterol     >40 mg/dL  52  Total CHOL/HDL Ratio     RATIO  3.1  VLDL     0 - 40 mg/dL  18  LDL (calc)     0 - 99 mg/dL  89  Hemoglobin A1C     4.8 - 5.6 % 5.6   Mean Plasma Glucose     mg/dL 114   TSH     0.350 - 4.500 uIU/mL 2.173     Assessment: As you may recall, he is a 82 y.o. Caucasian male with PMH of history of AF on eliquis 2.5mg  bid, sinus bradycardia, HTN, hypothyroidism and glaucoma admitted on 09/07/16 for transient right hemianopia for 5-6 hours. MRI showed small right cortical and subcortical left parieto-occipital infarcts with petechial hemorrhagic transformation. MRA, CUS and TTE unremarkable. LEL 89 and A1C 5.6. His Cre 1.33, and his eliquis was underdosed. Due to petechial hemorrhage, he was put on ASA for 7 days before starting eliquis 5mg  bid. He was also discharged with lipitor 20mg . Followed with PCP for renal function monitoring. During the interval time, he is doing well.  Plan:  - continue eliquis 5mg  twice  a day. Follow up with PCP for renal function monitroing - continue lipitor for stroke prevention - Follow up with your primary care physician for stroke risk factor modification. Recommend maintain blood pressure goal <130/80, diabetes with hemoglobin A1c goal below 7.0% and lipids with LDL cholesterol goal below 70 mg/dL.  - check BP at home and record - follow up with cardiology as scheduled - continue home exercise after PT/OT - follow up in 3 months   I spent more than 25 minutes of face to face time with the patient. Greater than 50% of time was spent in counseling and coordination of care. We discussed about  eliquis doing, cardiology follow up and home exercise.   No orders of the defined types were placed in this encounter.   Meds ordered this encounter  Medications  . atorvastatin (LIPITOR) 20 MG tablet    Sig: Take 1 tablet (20 mg total) by mouth daily.    Patient Instructions  - continue eliquis 5mg  twice a day. Follow up with PCP for renal function monitroing - continue lipitor for stroke prevention - Follow up with your primary care physician for stroke risk factor modification. Recommend maintain blood pressure goal <130/80, diabetes with hemoglobin A1c goal below 7.0% and lipids with LDL cholesterol goal below 70 mg/dL.  - check BP at home and record - follow up with cardiology as scheduled - continue home exercise after PT/OT - follow up in 3 months    Rosalin Hawking, MD PhD [facility] [address], [place], [place] [address] [phone]

## 2016-10-02 NOTE — Patient Instructions (Addendum)
-   continue eliquis 5mg  twice a day. Follow up with PCP for renal function monitroing - continue lipitor for stroke prevention - Follow up with your primary care physician for stroke risk factor modification. Recommend maintain blood pressure goal <130/80, diabetes with hemoglobin A1c goal below 7.0% and lipids with LDL cholesterol goal below 70 mg/dL.  - check BP at home and record - follow up with cardiology as scheduled - continue home exercise after PT/OT - follow up in 3 months

## 2016-10-16 ENCOUNTER — Other Ambulatory Visit: Payer: Self-pay | Admitting: Cardiovascular Disease

## 2016-10-22 DIAGNOSIS — Z7189 Other specified counseling: Secondary | ICD-10-CM | POA: Diagnosis not present

## 2016-10-22 DIAGNOSIS — Z0000 Encounter for general adult medical examination without abnormal findings: Secondary | ICD-10-CM | POA: Diagnosis not present

## 2016-10-22 DIAGNOSIS — R5383 Other fatigue: Secondary | ICD-10-CM | POA: Diagnosis not present

## 2016-10-22 DIAGNOSIS — Z1211 Encounter for screening for malignant neoplasm of colon: Secondary | ICD-10-CM | POA: Diagnosis not present

## 2016-10-22 DIAGNOSIS — Z125 Encounter for screening for malignant neoplasm of prostate: Secondary | ICD-10-CM | POA: Diagnosis not present

## 2016-10-22 DIAGNOSIS — Z1389 Encounter for screening for other disorder: Secondary | ICD-10-CM | POA: Diagnosis not present

## 2016-10-22 DIAGNOSIS — Z299 Encounter for prophylactic measures, unspecified: Secondary | ICD-10-CM | POA: Diagnosis not present

## 2016-10-22 DIAGNOSIS — Z79899 Other long term (current) drug therapy: Secondary | ICD-10-CM | POA: Diagnosis not present

## 2016-10-23 DIAGNOSIS — Z85828 Personal history of other malignant neoplasm of skin: Secondary | ICD-10-CM | POA: Diagnosis not present

## 2016-10-23 DIAGNOSIS — L570 Actinic keratosis: Secondary | ICD-10-CM | POA: Diagnosis not present

## 2016-10-23 DIAGNOSIS — D1801 Hemangioma of skin and subcutaneous tissue: Secondary | ICD-10-CM | POA: Diagnosis not present

## 2016-10-23 DIAGNOSIS — L821 Other seborrheic keratosis: Secondary | ICD-10-CM | POA: Diagnosis not present

## 2016-11-05 DIAGNOSIS — H5213 Myopia, bilateral: Secondary | ICD-10-CM | POA: Diagnosis not present

## 2016-11-05 DIAGNOSIS — H401121 Primary open-angle glaucoma, left eye, mild stage: Secondary | ICD-10-CM | POA: Diagnosis not present

## 2016-11-05 DIAGNOSIS — H52223 Regular astigmatism, bilateral: Secondary | ICD-10-CM | POA: Diagnosis not present

## 2016-11-05 DIAGNOSIS — Z961 Presence of intraocular lens: Secondary | ICD-10-CM | POA: Diagnosis not present

## 2016-11-10 ENCOUNTER — Other Ambulatory Visit: Payer: Self-pay | Admitting: Cardiovascular Disease

## 2016-11-21 ENCOUNTER — Telehealth: Payer: Self-pay | Admitting: Cardiovascular Disease

## 2016-11-21 NOTE — Telephone Encounter (Signed)
He could come in for an EKG at his convenience with results to Dr. Claiborne Billings.

## 2016-11-21 NOTE — Telephone Encounter (Signed)
New Message  Pt call requesting to speak with RN. Pt states pulse for many months has been constantly ranging from 55-60. Pt states the pass couple of days his pulse has been ranging from 95-100. Pt would like further instructions. please call back to discuss

## 2016-11-21 NOTE — Telephone Encounter (Signed)
Spoke to patient. Hx of A Fib - on rate control and anticoagulation.  Pt of Dr. Claiborne Billings, was last seen in October w recommendation for 6 mo f/u. At that time his amiodarone was adjusted from 200mg  daily to 100mg  daily. As patient recalls, this was due to Dr. Claiborne Billings being concerned about bradycardia.  Pt notes his HR has since then been 55-60, but in the last 8 days he noted it was 95-100. He checks his HR and BP regularly - usually BID. He identifies no changes to his BP  - still usually 115-120/65-70  Pt states he's experiencing no other concerns. Neg for fatigue, dizziness, SOB, etc.  Informed him I would get recommendation from provider on whether clinic visit or med adjustment recommended. Pt expressed understanding and agreement w plan.

## 2016-11-21 NOTE — Telephone Encounter (Signed)
Spoke w patient, communicated recommendations - he voiced he cannot come in today but is agreeable to come in next week for check. Sched for Monday afternoon at patient's preference.

## 2016-11-24 ENCOUNTER — Ambulatory Visit (INDEPENDENT_AMBULATORY_CARE_PROVIDER_SITE_OTHER): Payer: Medicare Other | Admitting: *Deleted

## 2016-11-24 VITALS — BP 138/82 | HR 82

## 2016-11-24 DIAGNOSIS — I4891 Unspecified atrial fibrillation: Secondary | ICD-10-CM

## 2016-11-24 NOTE — Patient Instructions (Signed)
Follow up as advised. I will relay further instructions after Dr. Claiborne Billings reviews.

## 2016-11-24 NOTE — Progress Notes (Signed)
This patient came for EKG visit today. Spoke w him regarding concerns. As noted in triage encounter last week, he voiced concern that in last 2 weeks his HR has averagely been faster than in the 1-2 months prior. He brought me readings that he tracks showing his BP trends and HR trends as well as his notes for when he has changes to medications. Pt has history of A Fib for which he is rate controlled. EKG done today Dr. Debara Pickett noted Atrial flutter w 2 to 1 conduction. EKG signed. As pt asymptomatic and HR within normal range, Dr. Debara Pickett recommended no changes to meds for now. Advised patient to let us know if his baseline HR runs faster than 100-110 or if he has symptoms such as fatigue, SOB at rest/exertion, etc.  I've put EKG in to be scanned by medical records for review. If Dr. Claiborne Billings has further advice for patient such as sooner f/u (sees next in 3 months) I will communicate this to patient.

## 2016-11-24 NOTE — Telephone Encounter (Signed)
Have reviewed and seen patient for visit today.

## 2016-12-09 ENCOUNTER — Encounter (INDEPENDENT_AMBULATORY_CARE_PROVIDER_SITE_OTHER): Payer: Self-pay | Admitting: Internal Medicine

## 2016-12-09 ENCOUNTER — Ambulatory Visit (INDEPENDENT_AMBULATORY_CARE_PROVIDER_SITE_OTHER): Payer: Medicare Other | Admitting: Internal Medicine

## 2016-12-09 VITALS — BP 130/84 | HR 87 | Temp 97.5°F | Resp 18 | Ht 68.0 in | Wt 152.6 lb

## 2016-12-09 DIAGNOSIS — K5900 Constipation, unspecified: Secondary | ICD-10-CM | POA: Diagnosis not present

## 2016-12-09 DIAGNOSIS — R143 Flatulence: Secondary | ICD-10-CM

## 2016-12-09 DIAGNOSIS — D649 Anemia, unspecified: Secondary | ICD-10-CM

## 2016-12-09 MED ORDER — SIMETHICONE 180 MG PO CAPS: 180.0000 mg | ORAL_CAPSULE | Freq: Two times a day (BID) | ORAL | 0 refills | Status: DC

## 2016-12-09 MED ORDER — DOCUSATE SODIUM 100 MG PO CAPS: 100.0000 mg | ORAL_CAPSULE | Freq: Every day | ORAL | 0 refills | Status: DC

## 2016-12-09 NOTE — Patient Instructions (Signed)
Call office with progress report and 3-4 weeks. Kegel exercise 2-3 times a day as discussed. If Phazyme does not help with bloating will try other remedies.

## 2016-12-09 NOTE — Progress Notes (Signed)
Presenting complaint;  Bloating and excessive flatus  Subjective:  Patient is 83 year old Caucasian male who is here for scheduled visit. He was initially seen 1 year ago for flatulence and confirmed to have small intestinal bacterial overgrowth and responded to antibiotic therapy. More recently he was seen in August 2017 for mild anemia. It was decided to monitor his H&H. He is here for scheduled visit. He complains of bloating and he also complains of excessive flatus. He states he has no control. At times he passes gas 20 stands up from sitting position. He states that his very embarrassing. He occasionally has fecal seepage when his stool is loose. He is prone to constipation. He has been using Colace on a when necessary basis which is not listed below. His appetite is good but he seemed to get filled up quickly. However he is not losing weight. He also complains of abdominal rumbling. In November 2017 he developed transient visual loss and was admitted to [facility] and diagnosed with acute CVA. Eliquis once by mouth twice a day. He denies melena or rectal bleeding. He states visual loss recovered in matter of 5-6 hours. He was also begun on statin.   Current Medications: Outpatient Encounter Prescriptions as of 12/09/2016  Medication Sig  . amiodarone (PACERONE) 100 MG tablet Take 1 tablet (100 mg total) by mouth daily.  Marland Kitchen atorvastatin (LIPITOR) 20 MG tablet Take 1 tablet (20 mg total) by mouth daily.  . COMBIGAN 0.2-0.5 % ophthalmic solution Place 1 drop into both eyes 2 (two) times daily.  Marland Kitchen doxazosin (CARDURA) 4 MG tablet TAKE 1 TABLET BY MOUTH EVERY DAY  . ELIQUIS 5 MG TABS tablet TAKE 1 TABLET BY MOUTH TWICE DAILY  . finasteride (PROSCAR) 5 MG tablet Take 5 mg by mouth daily.  . hydrALAZINE (APRESOLINE) 50 MG tablet TAKE 1 TABLET BY MOUTH EVERY 8 HOURS  . levothyroxine (SYNTHROID, LEVOTHROID) 50 MCG tablet Take 50 mcg by mouth daily.  Marland Kitchen LORazepam (ATIVAN) 1 MG tablet TAKE 1/2 TABLET OR 1  TABLET BY MOUTH AT BEDTIME AS NEEDED  . spironolactone (ALDACTONE) 25 MG tablet Take 1/2 tablet daily for for one week. Then increase to 1/2 tablet twice a day.   No facility-administered encounter medications on file as of 12/09/2016.      Objective: Blood pressure 130/84, pulse 87, temperature 97.5 F (36.4 C), temperature source Oral, resp. rate 18, height 5\' 8"  (1.727 m), weight 152 lb 9.6 oz (69.2 kg). Patient is alert and in no acute distress. Conjunctiva is pink. Sclera is nonicteric Oropharyngeal mucosa is normal. No neck masses or thyromegaly noted. Cardiac exam with regular rhythm normal S1 and S2. No murmur or gallop noted. Lungs are clear to auscultation. Abdomen is symmetrical. Bowel sounds are hyperactive. On palpation abdomen is soft and nontender. Percussion note is somewhat tympanitic.  No LE edema or clubbing noted.    Assessment:  #1. History of mild anemia. Hemoglobin of 11.6 may be normal for him. At any rate no evidence of GI bleed. #2. Bloating and excessive flatus. He does not have weight loss. If he does not respond to symptomatic therapy will retreated with antibiotics since he was confirmed to have small intestinal bacterial overgrowth last year. #3. Constipation.   Plan:  Kegel exercise 2-3 times a day as explained to the patient. Take Colace 100 mg by mouth daily at bedtime. Phazyme 180 mg by mouth twice a day. If Phazyme does not help him will consider probiotic prior to using antibiotic. Patient  will call office with progress report in 3-4 weeks. Office visit in 6 months.

## 2016-12-13 ENCOUNTER — Other Ambulatory Visit: Payer: Self-pay | Admitting: Cardiovascular Disease

## 2016-12-15 ENCOUNTER — Other Ambulatory Visit: Payer: Self-pay | Admitting: Cardiovascular Disease

## 2016-12-16 ENCOUNTER — Other Ambulatory Visit: Payer: Self-pay

## 2016-12-16 MED ORDER — SPIRONOLACTONE 25 MG PO TABS: 12.5000 mg | ORAL_TABLET | Freq: Two times a day (BID) | ORAL | 6 refills | Status: DC

## 2016-12-21 DIAGNOSIS — S66911A Strain of unspecified muscle, fascia and tendon at wrist and hand level, right hand, initial encounter: Secondary | ICD-10-CM | POA: Diagnosis not present

## 2016-12-21 DIAGNOSIS — Z7901 Long term (current) use of anticoagulants: Secondary | ICD-10-CM | POA: Diagnosis not present

## 2016-12-21 DIAGNOSIS — Z8673 Personal history of transient ischemic attack (TIA), and cerebral infarction without residual deficits: Secondary | ICD-10-CM | POA: Diagnosis not present

## 2016-12-21 DIAGNOSIS — M19031 Primary osteoarthritis, right wrist: Secondary | ICD-10-CM | POA: Diagnosis not present

## 2016-12-21 DIAGNOSIS — S61512A Laceration without foreign body of left wrist, initial encounter: Secondary | ICD-10-CM | POA: Diagnosis not present

## 2016-12-21 DIAGNOSIS — S0083XA Contusion of other part of head, initial encounter: Secondary | ICD-10-CM | POA: Diagnosis not present

## 2016-12-21 DIAGNOSIS — Z79899 Other long term (current) drug therapy: Secondary | ICD-10-CM | POA: Diagnosis not present

## 2016-12-21 DIAGNOSIS — W0110XA Fall on same level from slipping, tripping and stumbling with subsequent striking against unspecified object, initial encounter: Secondary | ICD-10-CM | POA: Diagnosis not present

## 2016-12-21 DIAGNOSIS — S199XXA Unspecified injury of neck, initial encounter: Secondary | ICD-10-CM | POA: Diagnosis not present

## 2016-12-21 DIAGNOSIS — H409 Unspecified glaucoma: Secondary | ICD-10-CM | POA: Diagnosis not present

## 2016-12-21 DIAGNOSIS — S66912A Strain of unspecified muscle, fascia and tendon at wrist and hand level, left hand, initial encounter: Secondary | ICD-10-CM | POA: Diagnosis not present

## 2016-12-21 DIAGNOSIS — S0990XA Unspecified injury of head, initial encounter: Secondary | ICD-10-CM | POA: Diagnosis not present

## 2016-12-21 DIAGNOSIS — I10 Essential (primary) hypertension: Secondary | ICD-10-CM | POA: Diagnosis not present

## 2016-12-21 DIAGNOSIS — M19032 Primary osteoarthritis, left wrist: Secondary | ICD-10-CM | POA: Diagnosis not present

## 2016-12-23 DIAGNOSIS — Z713 Dietary counseling and surveillance: Secondary | ICD-10-CM | POA: Diagnosis not present

## 2016-12-23 DIAGNOSIS — M25539 Pain in unspecified wrist: Secondary | ICD-10-CM | POA: Diagnosis not present

## 2016-12-23 DIAGNOSIS — Z299 Encounter for prophylactic measures, unspecified: Secondary | ICD-10-CM | POA: Diagnosis not present

## 2016-12-23 DIAGNOSIS — E663 Overweight: Secondary | ICD-10-CM | POA: Diagnosis not present

## 2016-12-26 DIAGNOSIS — Z789 Other specified health status: Secondary | ICD-10-CM | POA: Diagnosis not present

## 2016-12-26 DIAGNOSIS — I639 Cerebral infarction, unspecified: Secondary | ICD-10-CM | POA: Diagnosis not present

## 2016-12-26 DIAGNOSIS — I471 Supraventricular tachycardia: Secondary | ICD-10-CM | POA: Diagnosis not present

## 2016-12-26 DIAGNOSIS — M79642 Pain in left hand: Secondary | ICD-10-CM | POA: Diagnosis not present

## 2016-12-26 DIAGNOSIS — Z299 Encounter for prophylactic measures, unspecified: Secondary | ICD-10-CM | POA: Diagnosis not present

## 2016-12-26 DIAGNOSIS — M19042 Primary osteoarthritis, left hand: Secondary | ICD-10-CM | POA: Diagnosis not present

## 2016-12-26 DIAGNOSIS — Z713 Dietary counseling and surveillance: Secondary | ICD-10-CM | POA: Diagnosis not present

## 2016-12-26 DIAGNOSIS — D709 Neutropenia, unspecified: Secondary | ICD-10-CM | POA: Diagnosis not present

## 2016-12-26 DIAGNOSIS — Z6825 Body mass index (BMI) 25.0-25.9, adult: Secondary | ICD-10-CM | POA: Diagnosis not present

## 2016-12-30 ENCOUNTER — Other Ambulatory Visit: Payer: Self-pay | Admitting: Cardiovascular Disease

## 2016-12-30 NOTE — Telephone Encounter (Signed)
Lasix d/c'ed 05/16/16

## 2017-01-05 DIAGNOSIS — Z713 Dietary counseling and surveillance: Secondary | ICD-10-CM | POA: Diagnosis not present

## 2017-01-05 DIAGNOSIS — I10 Essential (primary) hypertension: Secondary | ICD-10-CM | POA: Diagnosis not present

## 2017-01-05 DIAGNOSIS — Z6824 Body mass index (BMI) 24.0-24.9, adult: Secondary | ICD-10-CM | POA: Diagnosis not present

## 2017-01-05 DIAGNOSIS — Z299 Encounter for prophylactic measures, unspecified: Secondary | ICD-10-CM | POA: Diagnosis not present

## 2017-01-06 ENCOUNTER — Ambulatory Visit (INDEPENDENT_AMBULATORY_CARE_PROVIDER_SITE_OTHER): Payer: Medicare Other | Admitting: Neurology

## 2017-01-06 ENCOUNTER — Encounter: Payer: Self-pay | Admitting: Neurology

## 2017-01-06 VITALS — BP 120/77 | HR 86 | Wt 154.0 lb

## 2017-01-06 DIAGNOSIS — E785 Hyperlipidemia, unspecified: Secondary | ICD-10-CM | POA: Diagnosis not present

## 2017-01-06 DIAGNOSIS — Z7901 Long term (current) use of anticoagulants: Secondary | ICD-10-CM | POA: Diagnosis not present

## 2017-01-06 DIAGNOSIS — I63412 Cerebral infarction due to embolism of left middle cerebral artery: Secondary | ICD-10-CM

## 2017-01-06 DIAGNOSIS — I10 Essential (primary) hypertension: Secondary | ICD-10-CM | POA: Diagnosis not present

## 2017-01-06 DIAGNOSIS — I480 Paroxysmal atrial fibrillation: Secondary | ICD-10-CM

## 2017-01-06 NOTE — Patient Instructions (Addendum)
-   continue eliquis and lipitor for stroke prevention. Follow up with PCP for renal function monitoring. - Follow up with your primary care physician for stroke risk factor modification. Recommend maintain blood pressure goal <130/80, diabetes with hemoglobin A1c goal below 7.0% and lipids with LDL cholesterol goal below 70 mg/dL.  - check BP at home and record - follow up with cardiology as scheduled. - will observe the right hand numbness tingling, if not improve over time, will consider nerve conduction study.  - home exercise and avoid fall. - follow up in 4 months.

## 2017-01-06 NOTE — Progress Notes (Signed)
STROKE NEUROLOGY FOLLOW UP NOTE  NAME: Dylan York DOB: 08/01/1926  REASON FOR VISIT: stroke follow up HISTORY FROM: pt and chart  Today we had the pleasure of seeing Dylan York in follow-up at our Neurology Clinic. Pt was accompanied by wife.   History Summary [name] is a 83 y.o. male with history of AF on eliquis 2.5mg  bid, sinus bradycardia, HTN, hypothyroidism and glaucoma admitted on 09/07/16 for transient right hemianopia for 5-6 hours. MRI showed small right cortical and subcortical left parieto-occipital infarcts with petechial hemorrhagic transformation. MRA, CUS and TTE unremarkable. LEL 89 and A1C 5.6. His Cre 1.33, and his eliquis was underdosed. Due to petechial hemorrhage, he was put on ASA for 7 days before starting eliquis 5mg  bid. He was also discharged with lipitor 20mg .   10/02/16 follow up - the patient has been doing well. He is on eliquis 5mg  bid and lipitor without side effect. Followed with PCP and recent renal function check was told normal. BP today 117/61, well controlled at home. No complains. Currently is ready to be discharged from PT/OT.   Interval History During the interval time, the pt has been doing well except had a mechanical fall on 12/21/16. He hit her left forehead, both wrist and left flank. Went to [facility], had head CT negative. He was put on b/l wrist splint which were taken off yesterday after following with PCP. He still complain of intermittent right finger tingling and burning sensation especially at night. BP today 120/77. On eliquis 5mg  bid, no side effect.   REVIEW OF SYSTEMS: Full 14 system review of systems performed and notable only for those listed below and in HPI above, all others are negative:  Constitutional:   Cardiovascular:  Ear/Nose/Throat:   Skin:  Eyes:   Respiratory:  SOB Gastroitestinal:  constipation Genitourinary:  Hematology/Lymphatic:   Endocrine:  Musculoskeletal:  Back pain, aching  muscles Allergy/Immunology:   Neurological:   Psychiatric:  Sleep: frequent waking  The following represents the patient's updated allergies and side effects list: Allergies  Allergen Reactions  . Multaq [Dronedarone] Shortness Of Breath    The neurologically relevant items on the patient's problem list were reviewed on today's visit.  Neurologic Examination  A problem focused neurological exam (12 or more points of the single system neurologic examination, vital signs counts as 1 point, cranial nerves count for 8 points) was performed.  Blood pressure 120/77, pulse 86, weight 154 lb (69.9 kg).  General - Well nourished, well developed, in no apparent distress.  Ophthalmologic - Sharp disc margins OU.   Cardiovascular - Regular rate and rhythm.  Mental Status -  Level of arousal and orientation to time, place, and person were intact. Language including expression, naming, repetition, comprehension was assessed and found intact. Attention span and concentration were normal. Fund of Knowledge was assessed and was intact.  Cranial Nerves II - XII - II - Visual field intact OU. III, IV, VI - Extraocular movements intact. V - Facial sensation intact bilaterally. VII - Facial movement intact bilaterally. VIII - Hearing & vestibular intact bilaterally. X - Palate elevates symmetrically. XI - Chin turning & shoulder shrug intact bilaterally. XII - Tongue protrusion intact.  Motor Strength - The patient's strength was normal in all extremities and pronator drift was absent.  Bulk was normal and fasciculations were absent.   Motor Tone - Muscle tone was assessed at the neck and appendages and was normal.  Reflexes - The patient's reflexes  were 1+ in all extremities and he had no pathological reflexes.  Sensory - Light touch, temperature/pinprick were assessed and were symmetrical.    Coordination - The patient had normal movements in the hands with no ataxia or  dysmetria.  Tremor was absent.  Gait and Station - The patient's transfers, posture, gait, station, and turns were observed as normal.   Data reviewed: I personally reviewed the images and agree with the radiology interpretations.  Ct Head Wo Contrast 09/07/2016 1.  No evidence of acute intracranial abnormality. 2. Involutional change and moderate chronic small vessel ischemia.   MRI HEAD  09/07/2016 1. Small volume acute cortical and subcortical left parieto-occipital ischemic infarcts as above. Associated petechial hemorrhage without frank hemorrhagic transformation. No associated mass effect. 2. Moderate chronic microvascular ischemic disease.   MRA HEAD  09/07/2016 Normal intracranial MRA for patient age. No large or proximal arterial branch occlusion. No high-grade or correctable stenosis.   Carotid Doppler   There is 1-39% bilateral ICA stenosis. Vertebral artery flow is antegrade.   2D Echocardiogram  - Left ventricle: The cavity size was normal. There was mild focalbasal hypertrophy of the septum. Systolic function was normal.The estimated ejection fraction was in the range of 55% to 60%.Wall motion was normal; there were no regional wall motionabnormalities. Doppler parameters are consistent with abnormalleft ventricular relaxation (grade 1 diastolic dysfunction). - Aortic valve: Trileaflet; mildly thickened, mildly calcifiedleaflets. There was mild regurgitation. - Mitral valve: Calcified annulus. - Left atrium: The atrium was moderately dilated. Volume/bsa, S:40.4 ml/m^2. - Right ventricle: The cavity size was mildly dilated. Wallthickness was normal. Systolic function was mildly reduced. - Right atrium: The atrium was moderately dilated. - Tricuspid valve: There was moderate regurgitation. - Pulmonary arteries: Systolic pressure was moderately increased.PA peak pressure: 55 mm Hg (S). Impressions:  Compared to the prior study, there has been no  significantinterval change.  Component     Latest Ref Rng & Units 09/07/2016 09/08/2016  Cholesterol     0 - 200 mg/dL  159  Triglycerides     <150 mg/dL  92  HDL Cholesterol     >40 mg/dL  52  Total CHOL/HDL Ratio     RATIO  3.1  VLDL     0 - 40 mg/dL  18  LDL (calc)     0 - 99 mg/dL  89  Hemoglobin A1C     4.8 - 5.6 % 5.6   Mean Plasma Glucose     mg/dL 114   TSH     0.350 - 4.500 uIU/mL 2.173     Assessment: As you may recall, he is a 83 y.o. Caucasian male with PMH of history of AF on eliquis 2.5mg  bid, sinus bradycardia, HTN, hypothyroidism and glaucoma admitted on 09/07/16 for transient right hemianopia for 5-6 hours. MRI showed small right cortical and subcortical left parieto-occipital infarcts with petechial hemorrhagic transformation. MRA, CUS and TTE unremarkable. LDL 89 and A1C 5.6. His Cre 1.33, and his eliquis was underdosed. Due to petechial hemorrhage, he was put on ASA for 7 days before starting eliquis 5mg  bid. He was also discharged with lipitor 20mg . Followed with PCP for renal function monitoring. During the interval time, he is doing well. Had recent mechanical fall and CT head negative. Still has right finger tingling burning, will consider EMG/NMCS if not improving.   Plan:  - continue eliquis and lipitor for stroke prevention. Follow up with PCP for renal function monitoring. - Follow up with your primary care physician  for stroke risk factor modification. Recommend maintain blood pressure goal <130/80, diabetes with hemoglobin A1c goal below 7.0% and lipids with LDL cholesterol goal below 70 mg/dL.  - check BP at home and record - follow up with cardiology as scheduled. - continue to observe the right hand numbness/burning, if not improve over time, will consider EMG/NCS.  - home exercise and avoid fall. - follow up in 4 months.  I spent more than 25 minutes of face to face time with the patient. Greater than 50% of time was spent in counseling and  coordination of care. We discussed about renal function monitoring, right finger observation and avoid falls.   No orders of the defined types were placed in this encounter.   Meds ordered this encounter  Medications  . HYDROcodone-acetaminophen (NORCO/VICODIN) 5-325 MG tablet  . DISCONTD: amiodarone (PACERONE) 200 MG tablet  . furosemide (LASIX) 20 MG tablet  . DISCONTD: potassium chloride (K-DUR,KLOR-CON) 10 MEQ tablet    Patient Instructions  - continue eliquis and lipitor for stroke prevention. Follow up with PCP for renal function monitoring. - Follow up with your primary care physician for stroke risk factor modification. Recommend maintain blood pressure goal <130/80, diabetes with hemoglobin A1c goal below 7.0% and lipids with LDL cholesterol goal below 70 mg/dL.  - check BP at home and record - follow up with cardiology as scheduled. - will observe the right hand numbness tingling, if not improve over time, will consider nerve conduction study.  - home exercise and avoid fall. - follow up in 4 months.   Rosalin Hawking, MD PhD [facility] [address], [place], [place] [address] [phone]

## 2017-01-30 ENCOUNTER — Telehealth: Payer: Self-pay | Admitting: Cardiovascular Disease

## 2017-01-30 NOTE — Telephone Encounter (Signed)
Samples at front desk, informed patient via VM.

## 2017-01-30 NOTE — Telephone Encounter (Signed)
Patient calling to see if he can pick up some Eliquis 5mg   Samples on Monday 02-02-17 at his appointment. Please call to verify,thanks.

## 2017-02-02 ENCOUNTER — Encounter: Payer: Self-pay | Admitting: Cardiovascular Disease

## 2017-02-02 ENCOUNTER — Ambulatory Visit (INDEPENDENT_AMBULATORY_CARE_PROVIDER_SITE_OTHER): Payer: Medicare Other | Admitting: Cardiovascular Disease

## 2017-02-02 VITALS — BP 126/80 | HR 86 | Ht 68.0 in | Wt 150.0 lb

## 2017-02-02 DIAGNOSIS — I63412 Cerebral infarction due to embolism of left middle cerebral artery: Secondary | ICD-10-CM | POA: Diagnosis not present

## 2017-02-02 DIAGNOSIS — I480 Paroxysmal atrial fibrillation: Secondary | ICD-10-CM | POA: Diagnosis not present

## 2017-02-02 DIAGNOSIS — I10 Essential (primary) hypertension: Secondary | ICD-10-CM | POA: Diagnosis not present

## 2017-02-02 DIAGNOSIS — I483 Typical atrial flutter: Secondary | ICD-10-CM

## 2017-02-02 DIAGNOSIS — H53469 Homonymous bilateral field defects, unspecified side: Secondary | ICD-10-CM

## 2017-02-02 DIAGNOSIS — Z7901 Long term (current) use of anticoagulants: Secondary | ICD-10-CM | POA: Diagnosis not present

## 2017-02-02 DIAGNOSIS — I69398 Other sequelae of cerebral infarction: Secondary | ICD-10-CM | POA: Diagnosis not present

## 2017-02-02 DIAGNOSIS — E039 Hypothyroidism, unspecified: Secondary | ICD-10-CM | POA: Diagnosis not present

## 2017-02-02 MED ORDER — AMIODARONE HCL 200 MG PO TABS: ORAL_TABLET | ORAL | 3 refills | Status: DC

## 2017-02-02 NOTE — Progress Notes (Addendum)
Patient ID: Dylan York, male   DOB: [birth year], 83 y.o.   MRN: [id number]     HPI: Dylan York is a 83 y.o. male who presents to the office for 6 month  followup cardiology evaluation.  Dylan York has a history of sick sinus syndrome with atrial tachycardia as well as sinus bradycardia, ventricular bigeminy, PVCs, PAF and difficult to control hypertension  Dylan York had stabilized with Bystolic but when Dylan York was seen on 03/08/2013 Dylan York was in atrial fibrillation of questionable duration. At that time I increased his Bystolic and his started anticoagulation with Eliquis 2.5 mg twice a day. An echo Doppler study on 03/30/2013 revealed normal systolic function with an ejection fraction of 60-65%, aortic valve sclerosis, biatrial enlargement, as well as mild pulmonary hypertension with a PA pressure of 41 mm. On 04/04/2013 AF 77 rate was beats per minute. Dylan York was initially started  on Multaq 400 mg twice a day but did not tolerate this and ultimately discontinue its use.  When  l saw him on 04/26/2013 I started him on amiodarone 200 mg daily. Dylan York has tolerated this well. 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  On 05/27/2013 I further titrated his amiodarone to 200 mg twice a day. I also recommended that Dylan York reduce his Bystolic from 10 to 5 mg with a plan to do a cardioversion but as part of his preoperative assessment Dylan York was found to have pneumonia. His  pneumonia was treated with antibiotic therapy by Dr. Woody Seller in [place]. On 08/05/2013 Dylan York was seen in the office as an add-on at which time Dylan York had significant accelerated hypertension with a blood pressure of 220/100. Dylan York was seen by [name] and admitted to [facility]. Dylan York  was treated with IV Cardene and Dylan York converted to sinus rhythm. Ultimately nicardipine was discontinued and amlodipine was started as well as hydralazine. Dylan York was discharged home on 08/09/2013.  His heart rhythm has been fairly stable. Dylan York has noticed some mild ankle swelling right greater than left. Lower extremityDoppler studies were negative for DVT. On 08/23/2013  amlodipine was reduced from 10 to7.5 mg and I reduced his amiodarone from 400 mg to 300 mg. Subsequently, Dylan York now is on a further reduce dose of amlodipine at just 2.5 mg daily. Dylan York does note continued increasing lower extremity edema, right leg greater than left. Dylan York has not been taking his furosemide  20 mg regularly.  On 10/31/2013 I discontinued his amlodipine and recommended furosemide 20 mg daily due to his significant  pretibial and ankle edema. We also discussed sodium restriction. Since that time Dylan York has been using 20 -30 mm  support stockings below the knee with marked benefit.  A renal duplex scan in February 2015 demonstrated right renal artery narrowing proximally in the range of 1-59% with velocities at 213, suggesting the upper end of the range.  His left renal artery had normal pain. Dylan York  Dylan York has numerous large anechoic cysts in both kidneys with the largest measuring 8.9 x 7.6 x 8.0.  Dylan York is also followed by [name] urologically.  On 01/12/2015.  Dylan York underwent a follow-up renal duplex scan.  Dylan York again had mild bilateral renal artery narrowing at the right renal artery.  Increased velocity proximally at 185 in the left renal artery.  A 206 consistent with a 159% range diameter reduction.  The left kidney again showed numerous  an echo.  It cysts.  The 2 largest measured 7.08.3 x a 8.0 cm and [id number] cm.  Left kidney could not accurately be measured in length.  Due to large cyst within the lower pole of the kidney.  [name] has had problems with significant blood pressure lability and when seen in the past was taking multiple medications at different times throughout the day.  At that time Dylan York was taking Cardura 4 mg daily, hydralazine 50 mg twice a day , and an another dose at 25 mg, furosemide 20 mg daily, valsartan 320  mg.  Dylan York also is on levothyroxine 50 g for hypothyroidism.  Dylan York takes clonidine 0.1 mg if his blood pressure gets above 962 systolically.  Dylan York is also continuing to take amiodarone at 100 mg alternating with 200 mg and denies recurrent AF.  Dylan York is anticoagulated with eliquis.  Dylan York is very analytical and over the past 6 months Dylan York states that Dylan York has taken 607 blood pressure readings.  32 readings or 5% revealed blood pressure elevation 229 systolically for which Dylan York is took clonidine 5 times or 0.8%.    Dylan York has seen Dylan York on several occasions with significant blood pressure lability with blood pressure readings in excess of 798 systolically and diastolically over 90.  Dylan York also states at some times  his blood pressure may get low.  She had tried him on chlorthalidone, but a subsequent be met showed low potassium and sodium and it was discontinued.  At times Dylan York has noticed a sensation where Dylan York may have had transient atrial fibrillation.   When I last saw him, I tried to simplify his medications and recently Dylan York has been taking hydralazine 50 mg every 8 hours, valsartan 320 mg in the morning, Cardura 4 mg, amiodarone 100 mg and if Dylan York notes his blood pressure getting in excess of 200 Dylan York takes  0.1 mg clonidine PRN. At that time, it sounded like Dylan York may have had an episode of breakthrough atrial fibrillation and I slightly adjusted his amiodarone to 100 mg alternating with 200 mg every other day.  Dylan York brought with him his blood pressure data over the last several months including means and averages his blood pressure had been running in the 140-150 range, but there have been instances where his blood pressure is in excess of 200.  At his last office visit, I suggested the initiation of spironolactone which Dylan York took 12.5 mg for one week and then increase this to 12.5 mg twice a day.  This has resulted in significant stabilization of his blood pressure lability.    Since I last saw him, Dylan York apparently developed a small ischemic  CVA in 09/07/2017 and was hospitalized for 3 days was evaluated by Dr. Rigoberto Noel.  Dylan York presented with transient right hemianopia for 5-6 hours.  An MRI showed small cortical and subcortical left parieto-occipital infarct with petechial hemorrhagic transformation.  2.  The petechial hemorrhage is eloquent's was discontinued and Dylan York was started on aspirin for 7 days before plans to restart eliquis at 5 mg twice a day.  Dylan York was started on Lipitor 20 mg.  His neurologic symptoms completely resolved.  Dylan York has continued to have daily blood pressure readings as well as heart rate readings are charted out.  Dylan York did notice on January 11 that his mean pulse had risen to 7 wears previously was 62.  Several days later.  Dylan York apparently called the office and came 14 ECG.  At that time Dylan York was told that  his ECG did not show atrial fibrillation.  I was out of town, and ECG was apparently shown to another physician.  Since that time, Dylan York has noticed some mild irregularity to his pulse rate.  Dylan York believes his blood pressure has been fairly well controlled since spironolactone was added to his regimen.  Dylan York denies chest pain.  Dylan York did trip and fall on one occasion and landed on his wrists and has been wearing wrist support for stabilization.  Dylan York presents for evaluation.  Allergies  Allergen Reactions  . Multaq [Dronedarone] Shortness Of Breath    Current Outpatient Prescriptions  Medication Sig Dispense Refill  . amiodarone (PACERONE) 200 MG tablet Take 221m (1 tablet) twice daily for 1 week, then 3076m(1.5 tablets) daily 135 tablet 3  . atorvastatin (LIPITOR) 20 MG tablet Take 1 tablet (20 mg total) by mouth daily.    . COMBIGAN 0.2-0.5 % ophthalmic solution Place 1 drop into both eyes 2 (two) times daily.    . Marland Kitchenocusate sodium (COLACE) 100 MG capsule Take 1 capsule (100 mg total) by mouth daily. 30 capsule 0  . doxazosin (CARDURA) 4 MG tablet TAKE 1 TABLET BY MOUTH EVERY DAY 30 tablet 10  . ELIQUIS 5 MG TABS tablet TAKE 1 TABLET BY  MOUTH TWICE DAILY 60 tablet 10  . finasteride (PROSCAR) 5 MG tablet Take 5 mg by mouth daily.    . furosemide (LASIX) 20 MG tablet     . hydrALAZINE (APRESOLINE) 50 MG tablet TAKE 1 TABLET BY MOUTH EVERY 8 HOURS 270 tablet 1  . levothyroxine (SYNTHROID, LEVOTHROID) 50 MCG tablet Take 50 mcg by mouth daily.    . Marland KitchenORazepam (ATIVAN) 1 MG tablet TAKE 1/2 TABLET OR 1 TABLET BY MOUTH AT BEDTIME AS NEEDED  2  . Simethicone 180 MG CAPS Take 1 capsule (180 mg total) by mouth 2 (two) times daily.  0  . spironolactone (ALDACTONE) 25 MG tablet Take 0.5 tablets (12.5 mg total) by mouth 2 (two) times daily. 30 tablet 6   No current facility-administered medications for this visit.     Socially Dylan York is married and has 3 children Dylan York quit tobacco in 1978. 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 Dylan York tries to play golf once a week.  ROS General: Negative; No fevers, chills, or night sweats;  HEENT: Positive for history of glaucoma No changes in vision or hearing, sinus congestion, difficulty swallowing Pulmonary: Negative; No cough, wheezing, shortness of breath, hemoptysis Cardiovascular: Negative; No chest pain, presyncope, syncope, palpitations Positive for intermittent leg swelling GI: Negative; No nausea, vomiting, diarrhea, or abdominal pain GU: Positive for renal cysts; No dysuria, hematuria, or difficulty voiding Musculoskeletal: Negative; no myalgias, joint pain, or weakness Hematologic/Oncology: Negative; no easy bruising, bleeding Endocrine: Negative; no heat/cold intolerance; no diabetes Neuro: Negative; no changes in balance, headaches Skin: Negative; No rashes or skin lesions Psychiatric: Negative; No behavioral problems, depression Sleep: Negative; No snoring, daytime sleepiness, hypersomnolence, bruxism, restless legs, hypnogognic hallucinations, no cataplexy Other comprehensive 14 point system review is negative.    PE BP 126/80   Pulse 86   Ht _0  (1.727 m)   Wt 150 lb  (68 kg)   BMI 22.81 kg/m    Repeat blood pressure by me was 152/60  Wt Readings from Last 3 Encounters:  02/02/17 150 lb (68 kg)  01/06/17 154 lb (69.9 kg)  12/09/16 152 lb 9.6 oz (69.2 kg)   General: Alert, oriented, no distress.  Skin: normal turgor, no  rashes HEENT: Normocephalic, atraumatic. Pupils round and reactive; sclera anicteric;no lid lag.  Nose without nasal septal hypertrophy Mouth/Parynx benign; Mallinpatti scale 3 Neck: No JVD, no carotid bruits with normal carotid upstroke. Chest wall: No tenderness to palpate Lungs: clear to ausculatation and percussion; no wheezing or rales Heart:  Rhythm with mild irregularity with a ventricular rate in the upper 80s.; no s3. 1/6 sem; no diastolic murmur. No rubs thrills or heaves. Abdomen: soft, nontender; no hepatosplenomehaly, BS+; abdominal aorta nontender and not dilated by palpation. No definitive renal artery bruit Back: No CVA tenderness Pulses 2+ Extremities: Trace edema above the sock line bilaterally,  ;no clubbing cyanosis, Homan's sign negative  Neurologic: grossly nonfocal; cranial nerves grossly normal Psychologic: Normal affect and mood  ECG (independently read by me): Atrial flutter with variable block with ventricular rate in the 80s.  Nonspecific ST-T changes.  QTc interval 493 ms.  October 2017 ECG (independently read by me): Sinus bradycardia at 43 bpm with a meal.  First-degree block.  PAC.  QTc interval normal.  July 2017 ECG (independently read by me): Sinus rhythm but bradycardic in the 40s with sinus arrhythmia.  Nonspecific ST changes.  April 2017 ECG (independently read by me): August bradycardia at 53 bpm.  Sinus arrhythmia.  Nonspecific ST changes.  09/24/2015 ECG (independently read by me): Sinus bradycardia, first-degree AV block.  Ventricular rate 51.  PR interval 218 ms.  Nondiagnostic ST changes  May 2016 ECG (independently read by me): Sinus bradycardia 52 bpm.  PR interval 198 ms, QTc  interval 451 ms.  Nonspecific interventricular conduction delay.  February 2016 ECG (independently read by me, and (: Sinus bradycardia 48 bpm.  Nonspecific conduction delay.  No significant ST abnormalities.  QTc interval 434 ms.  November 2015 ECG (independently read by me):  Sinus bradycardia 52 bpm.  PR interval 216 ms.  No ectopy.  July 2015ECG (independently read by me): Sinus bradycardia with first degree AV block.  PR interval 240 ms.  Nonspecific ST changes.  PAC.  Prior 01/10/2014 ECG (independently read by me) sinus bradycardia 53 beats per minute. QTc interval 457 ms. No ectopy.  Prior 12/12/2013 ECG  (independently read by me): Sinus rhythm at 56 beats per minute. PR interval 206 ms. QTc interval 470 ms   Prior ECG of 10/31/2013: Normal sinus rhythm at 61 beats per minute. Isolated  PVC.  Non-specific intra-ventricular conduction delay. Nonspecific ST-T changes. QTc interval 461 ms.  LABS:  BMP Latest Ref Rng & Units 09/09/2016 09/08/2016 09/08/2016  Glucose 65 - 99 mg/dL 95 - 118(H)  BUN 6 - 20 mg/dL 21(H) - 20  Creatinine 0.61 - 1.24 mg/dL 1.33(H) 1.54(H) 1.44(H)  BUN/Creat Ratio 10 - 24 - - -  Sodium 135 - 145 mmol/L 138 - 138  Potassium 3.5 - 5.1 mmol/L 4.3 - 4.3  Chloride 101 - 111 mmol/L 111 - 108  CO2 22 - 32 mmol/L 23 - 24  Calcium 8.9 - 10.3 mg/dL 8.4(L) - 9.0    Hepatic Function Latest Ref Rng & Units 09/07/2016 06/05/2016 08/05/2013  Total Protein 6.5 - 8.1 g/dL 6.2(L) 5.9(L) 6.0  Albumin 3.5 - 5.0 g/dL 3.7 3.9 2.9(L)  AST 15 - 41 U/L _0 ALT 17 - 63 U/L 19 16 33  Alk Phosphatase 38 - 126 U/L 54 58 74  Total Bilirubin 0.3 - 1.2 mg/dL 1.2 1.1 1.0    CBC Latest Ref Rng & Units 09/07/2016 06/05/2016 08/06/2013  WBC 4.0 - 10.5  K/uL 5.1 3.9 6.6  Hemoglobin 13.0 - 17.0 g/dL 11.6(L) - 13.3  Hematocrit 39.0 - 52.0 % 34.3(L) 37.1(L) 36.9(L)  Platelets 150 - 400 K/uL 159 167 229   Lab Results  Component Value Date   MCV 98.0 09/07/2016   MCV 98 (H) 06/05/2016    MCV 90.0 08/06/2013   Lab Results  Component Value Date   TSH 2.173 09/07/2016   Lab Results  Component Value Date   HGBA1C 5.6 09/07/2016     Lipid Panel     Component Value Date/Time   CHOL 159 09/08/2016 0507   CHOL 161 06/05/2016 0807   TRIG 92 09/08/2016 0507   HDL 52 09/08/2016 0507   HDL 68 06/05/2016 0807   CHOLHDL 3.1 09/08/2016 0507   VLDL 18 09/08/2016 0507   LDLCALC 89 09/08/2016 0507   LDLCALC 77 06/05/2016 0807      RADIOLOGY: No results found.  IMPRESSION:  1. Typical atrial flutter ([place])   2. PAF (paroxysmal atrial fibrillation) ([place])   3. Essential hypertension   4. Anticoagulated   5. Hypothyroidism, unspecified type   6. Homonymous hemianopsia due to recent cerebrovascular accident (CVA)     ASSESSMENT AND PLAN: Mr. Brickley is an 83 year old white male who has a history of PAF and had been maintaining normal sinus rhythm on amiodarone.  When I last saw him 6 months ago, Dylan York was significantly bradycardic and I further reduced his amiodarone from 100/200 on every other day to 100 mg daily.  I reviewed his hospital records and office evaluation.  Since his last office evaluation.  It appears that Dylan York developed transient right hemianopsia 5-6 hours and on MRI imaging.  Dylan York was found to have small right cortical and subcortical left parieto-occipital infarct with petechial hemorrhage.  His eliquis was held and Dylan York was treated with full dose aspirin for 1 week and ultimately eliquis was resumed by the neurologist at 5 mg twice a day.  In January, Dylan York notices pulse rate had increased.  I reviewed his ECG from January 22 and by my review, it appears that the patient was in atrial flutter at that time.  His ECG today shows atrial flutter with variable block with a ventricular rate at 86.  Dylan York continues to be on anticoagulation and is without bleeding.  I recommended an initial titration of amiodarone to 200 mg twice a day for 1 week and then Dylan York will reduce this to 300 mg  daily.  Hopefully this may result in successful pharmacologic cardioversion back to sinus rhythm.  Dylan York will monitor his resting pulse and does need to be reduced if significant bradycardia develops.  I reviewed his blood pressure chart in detail.  His blood pressure today is controlled at 126/80 on doxacosin 4 mg at bedtime, drowsing 50 mg every 8 hours, and spironolactone 12.5 mg daily.  Dylan York continues to be on levothyroxine 50 g for hypothyroidism.  Dylan York is tolerating atorvastatin.  Lipid studies in November 2017 showed an LDL of 89. And in December.  Total cholesterol was 131, triglycerides 77, HDL 66, LDL 50 on laboratory done by Dr. Woody Seller. Fasting laboratory will be obtained including CMET, CBC, TSH, magnesium, and lipid studies.  I will see him back in the office in 2-3 weeks for reevaluation.    Time spent: 25 minutes  Troy Sine, MD, [facility]  02/02/2017 6:11 PM

## 2017-02-02 NOTE — Patient Instructions (Addendum)
Medication Instructions:  Increase your amiodarone to 200mg  (1 tablet) TWICE A DAY FOR 1 WEEK After 1 week, decrease the dose to 300mg  DAILY (1 and 1/2 tablets) (A new prescription for amiodarone has been called in for larger dose tablets.)  Labwork: CBC, CMET, TSH, Magnesium, Lipid   Testing/Procedures: none   Follow-Up: in 3 weeks after labwork with Dr. Claiborne Billings    If you need a refill on your cardiac medications before your next appointment, please call your pharmacy.

## 2017-02-19 DIAGNOSIS — Z299 Encounter for prophylactic measures, unspecified: Secondary | ICD-10-CM | POA: Diagnosis not present

## 2017-02-19 DIAGNOSIS — G5600 Carpal tunnel syndrome, unspecified upper limb: Secondary | ICD-10-CM | POA: Diagnosis not present

## 2017-02-19 DIAGNOSIS — Z6824 Body mass index (BMI) 24.0-24.9, adult: Secondary | ICD-10-CM | POA: Diagnosis not present

## 2017-02-19 DIAGNOSIS — M25539 Pain in unspecified wrist: Secondary | ICD-10-CM | POA: Diagnosis not present

## 2017-02-19 DIAGNOSIS — I480 Paroxysmal atrial fibrillation: Secondary | ICD-10-CM | POA: Diagnosis not present

## 2017-02-19 DIAGNOSIS — I10 Essential (primary) hypertension: Secondary | ICD-10-CM | POA: Diagnosis not present

## 2017-03-09 ENCOUNTER — Other Ambulatory Visit: Payer: Self-pay | Admitting: Cardiovascular Disease

## 2017-03-09 DIAGNOSIS — I10 Essential (primary) hypertension: Secondary | ICD-10-CM | POA: Diagnosis not present

## 2017-03-09 DIAGNOSIS — H401121 Primary open-angle glaucoma, left eye, mild stage: Secondary | ICD-10-CM | POA: Diagnosis not present

## 2017-03-09 DIAGNOSIS — H40021 Open angle with borderline findings, high risk, right eye: Secondary | ICD-10-CM | POA: Diagnosis not present

## 2017-03-09 DIAGNOSIS — I480 Paroxysmal atrial fibrillation: Secondary | ICD-10-CM | POA: Diagnosis not present

## 2017-03-10 LAB — COMPREHENSIVE METABOLIC PANEL
A/G RATIO: 1.9 (ref 1.2–2.2)
ALK PHOS: 61 IU/L (ref 39–117)
ALT: 21 IU/L (ref 0–44)
AST: 24 IU/L (ref 0–40)
Albumin: 3.8 g/dL (ref 3.5–4.7)
BUN/Creatinine Ratio: 15 (ref 10–24)
BUN: 20 mg/dL (ref 8–27)
Bilirubin Total: 1.3 mg/dL — ABNORMAL HIGH (ref 0.0–1.2)
CO2: 20 mmol/L (ref 18–29)
Calcium: 8.6 mg/dL (ref 8.6–10.2)
Chloride: 106 mmol/L (ref 96–106)
Creatinine, Ser: 1.33 mg/dL — ABNORMAL HIGH (ref 0.76–1.27)
GFR calc Af Amer: 55 mL/min/{1.73_m2} — ABNORMAL LOW (ref >59)
GFR calc non Af Amer: 47 mL/min/{1.73_m2} — ABNORMAL LOW (ref >59)
Globulin, Total: 2 g/dL (ref 1.5–4.5)
Glucose: 88 mg/dL (ref 65–99)
POTASSIUM: 4.4 mmol/L (ref 3.5–5.2)
SODIUM: 141 mmol/L (ref 134–144)
Total Protein: 5.8 g/dL — ABNORMAL LOW (ref 6.0–8.5)

## 2017-03-10 LAB — CBC WITH DIFFERENTIAL/PLATELET
Basophils Absolute: 0.1 10*3/uL (ref 0.0–0.2)
Basos: 1 %
EOS (ABSOLUTE): 0.1 10*3/uL (ref 0.0–0.4)
EOS: 2 %
HEMATOCRIT: 35.7 % — AB (ref 37.5–51.0)
Hemoglobin: 11.9 g/dL — ABNORMAL LOW (ref 13.0–17.7)
Immature Grans (Abs): 0 10*3/uL (ref 0.0–0.1)
Immature Granulocytes: 0 %
LYMPHS ABS: 1.3 10*3/uL (ref 0.7–3.1)
Lymphs: 25 %
MCH: 32.2 pg (ref 26.6–33.0)
MCHC: 33.3 g/dL (ref 31.5–35.7)
MCV: 97 fL (ref 79–97)
MONOS ABS: 0.4 10*3/uL (ref 0.1–0.9)
Monocytes: 8 %
Neutrophils Absolute: 3.4 10*3/uL (ref 1.4–7.0)
Neutrophils: 64 %
PLATELETS: 165 10*3/uL (ref 150–379)
RBC: 3.69 x10E6/uL — AB (ref 4.14–5.80)
RDW: 14.7 % (ref 12.3–15.4)
WBC: 5.2 10*3/uL (ref 3.4–10.8)

## 2017-03-10 LAB — LIPID PANEL W/O CHOL/HDL RATIO
CHOLESTEROL TOTAL: 122 mg/dL (ref 100–199)
HDL: 64 mg/dL (ref >39)
LDL Calculated: 43 mg/dL (ref 0–99)
TRIGLYCERIDES: 75 mg/dL (ref 0–149)
VLDL Cholesterol Cal: 15 mg/dL (ref 5–40)

## 2017-03-10 LAB — MAGNESIUM: Magnesium: 2.2 mg/dL (ref 1.6–2.3)

## 2017-03-10 LAB — TSH: TSH: 5.79 u[IU]/mL — ABNORMAL HIGH (ref 0.450–4.500)

## 2017-03-11 ENCOUNTER — Telehealth: Payer: Self-pay | Admitting: Cardiovascular Disease

## 2017-03-11 NOTE — Telephone Encounter (Signed)
Medication Samples have been provided to the patient.  Drug name:  Eliquis 5mg Qty: 56LOT: [id number] Exp.Date: 01/2019  The patient has been instructed regarding the correct time, dose, and frequency of taking this medication, including desired effects and most common side effects.   Pt aware samples available for pickup.

## 2017-03-11 NOTE — Telephone Encounter (Signed)
New message        Patient calling the office for samples of medication:   1.  What medication and dosage are you requesting samples for?eliquis 5mg   2.  Are you currently out of this medication? Almost out.  Want to pick up samples when he comes in for his 03-17-17 office visit

## 2017-03-12 ENCOUNTER — Other Ambulatory Visit: Payer: Self-pay | Admitting: Cardiovascular Disease

## 2017-03-12 DIAGNOSIS — M47812 Spondylosis without myelopathy or radiculopathy, cervical region: Secondary | ICD-10-CM | POA: Diagnosis not present

## 2017-03-12 DIAGNOSIS — M47816 Spondylosis without myelopathy or radiculopathy, lumbar region: Secondary | ICD-10-CM | POA: Diagnosis not present

## 2017-03-12 DIAGNOSIS — M9903 Segmental and somatic dysfunction of lumbar region: Secondary | ICD-10-CM | POA: Diagnosis not present

## 2017-03-12 DIAGNOSIS — M9901 Segmental and somatic dysfunction of cervical region: Secondary | ICD-10-CM | POA: Diagnosis not present

## 2017-03-12 DIAGNOSIS — M9902 Segmental and somatic dysfunction of thoracic region: Secondary | ICD-10-CM | POA: Diagnosis not present

## 2017-03-12 DIAGNOSIS — M546 Pain in thoracic spine: Secondary | ICD-10-CM | POA: Diagnosis not present

## 2017-03-12 NOTE — Telephone Encounter (Signed)
REFILL 

## 2017-03-16 ENCOUNTER — Ambulatory Visit: Payer: Medicare Other | Admitting: Cardiovascular Disease

## 2017-03-16 DIAGNOSIS — M9901 Segmental and somatic dysfunction of cervical region: Secondary | ICD-10-CM | POA: Diagnosis not present

## 2017-03-16 DIAGNOSIS — M9903 Segmental and somatic dysfunction of lumbar region: Secondary | ICD-10-CM | POA: Diagnosis not present

## 2017-03-16 DIAGNOSIS — M47816 Spondylosis without myelopathy or radiculopathy, lumbar region: Secondary | ICD-10-CM | POA: Diagnosis not present

## 2017-03-16 DIAGNOSIS — M546 Pain in thoracic spine: Secondary | ICD-10-CM | POA: Diagnosis not present

## 2017-03-16 DIAGNOSIS — M9902 Segmental and somatic dysfunction of thoracic region: Secondary | ICD-10-CM | POA: Diagnosis not present

## 2017-03-16 DIAGNOSIS — M47812 Spondylosis without myelopathy or radiculopathy, cervical region: Secondary | ICD-10-CM | POA: Diagnosis not present

## 2017-03-17 ENCOUNTER — Encounter: Payer: Self-pay | Admitting: Cardiovascular Disease

## 2017-03-17 ENCOUNTER — Ambulatory Visit (INDEPENDENT_AMBULATORY_CARE_PROVIDER_SITE_OTHER): Payer: Medicare Other | Admitting: Cardiovascular Disease

## 2017-03-17 VITALS — BP 134/73 | HR 57 | Ht 67.5 in | Wt 154.0 lb

## 2017-03-17 DIAGNOSIS — I483 Typical atrial flutter: Secondary | ICD-10-CM | POA: Diagnosis not present

## 2017-03-17 DIAGNOSIS — I495 Sick sinus syndrome: Secondary | ICD-10-CM | POA: Diagnosis not present

## 2017-03-17 DIAGNOSIS — I10 Essential (primary) hypertension: Secondary | ICD-10-CM | POA: Diagnosis not present

## 2017-03-17 DIAGNOSIS — I480 Paroxysmal atrial fibrillation: Secondary | ICD-10-CM | POA: Diagnosis not present

## 2017-03-17 DIAGNOSIS — E7800 Pure hypercholesterolemia, unspecified: Secondary | ICD-10-CM | POA: Diagnosis not present

## 2017-03-17 DIAGNOSIS — Z7901 Long term (current) use of anticoagulants: Secondary | ICD-10-CM | POA: Diagnosis not present

## 2017-03-17 DIAGNOSIS — I63412 Cerebral infarction due to embolism of left middle cerebral artery: Secondary | ICD-10-CM | POA: Diagnosis not present

## 2017-03-17 NOTE — Patient Instructions (Signed)
NO CHANGES WITH CURRENT MEDICATIONS       Your physician recommends that you schedule a follow-up appointment in Jun 16 2017 AT 9 :20 AM   If you need a refill on your cardiac medications before your next appointment, please call your pharmacy.

## 2017-03-17 NOTE — Progress Notes (Signed)
Patient ID: Dylan York, male   DOB: [birth year], 83 y.o.   MRN: [id number]     HPI: Dylan York is a 83 y.o. male who presents to the office for 6 month  followup cardiology evaluation.  [name] has a history of sick sinus syndrome with atrial tachycardia as well as sinus bradycardia, ventricular bigeminy, PVCs, PAF and difficult to control hypertension  He had stabilized with Bystolic but when he was seen on 03/08/2013 he was in atrial fibrillation of questionable duration. At that time I increased his Bystolic and his started anticoagulation with Eliquis 2.5 mg twice a day. An echo Doppler study on 03/30/2013 revealed normal systolic function with an ejection fraction of 60-65%, aortic valve sclerosis, biatrial enlargement, as well as mild pulmonary hypertension with a PA pressure of 41 mm. On 04/04/2013 AF 77 rate was beats per minute. He was initially started  on Multaq 400 mg twice a day but did not tolerate this and ultimately discontinue its use.  When  l saw him on 04/26/2013 I started him on amiodarone 200 mg daily. He has tolerated this well. 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  On 05/27/2013 I further titrated his amiodarone to 200 mg twice a day. I also recommended that he reduce his Bystolic from 10 to 5 mg with a plan to do a cardioversion but as part of his preoperative assessment he was found to have pneumonia. His  pneumonia was treated with antibiotic therapy by Dr. Woody Seller in [place]. On 08/05/2013 he was seen in the office as an add-on at which time he had significant accelerated hypertension with a blood pressure of 220/100. He was seen by [name] and admitted to [facility]. He  was treated with IV Cardene and he converted to sinus rhythm. Ultimately nicardipine was discontinued and amlodipine was started as well as hydralazine. He was discharged home on 08/09/2013.  His heart rhythm has been fairly stable. He has noticed some mild ankle swelling right greater than left. Lower extremityDoppler studies were negative for DVT. On 08/23/2013  amlodipine was reduced from 10 to7.5 mg and I reduced his amiodarone from 400 mg to 300 mg. Subsequently, he now is on a further reduce dose of amlodipine at just 2.5 mg daily. He does note continued increasing lower extremity edema, right leg greater than left. He has not been taking his furosemide  20 mg regularly.  On 10/31/2013 I discontinued his amlodipine and recommended furosemide 20 mg daily due to his significant  pretibial and ankle edema. We also discussed sodium restriction. Since that time he has been using 20 -30 mm  support stockings below the knee with marked benefit.  A renal duplex scan in February 2015 demonstrated right renal artery narrowing proximally in the range of 1-59% with velocities at 213, suggesting the upper end of the range.  His left renal artery had normal pain. He  He has numerous large anechoic cysts in both kidneys with the largest measuring 8.9 x 7.6 x 8.0.  He is also followed by [name] urologically.  On 01/12/2015.  He underwent a follow-up renal duplex scan.  He again had mild bilateral renal artery narrowing at the right renal artery.  Increased velocity proximally at 185 in the left renal artery.  A 206 consistent with a 159% range diameter reduction.  The left kidney again showed numerous  an echo.  It cysts.  The 2 largest measured 7.08.3 x a 8.0 cm and 005.005.005.005 cm.  Left kidney could not accurately be measured in length.  Due to large cyst within the lower pole of the kidney.  [name] has had problems with significant blood pressure lability and when seen in the past was taking multiple medications at different times throughout the day.  At that time he was taking Cardura 4 mg daily, hydralazine 50 mg twice a day , and an another dose at 25 mg, furosemide 20 mg daily, valsartan 320  mg.  He also is on levothyroxine 50 g for hypothyroidism.  He takes clonidine 0.1 mg if his blood pressure gets above 962 systolically.  He is also continuing to take amiodarone at 100 mg alternating with 200 mg and denies recurrent AF.  He is anticoagulated with eliquis.  He is very analytical and over the past 6 months he states that he has taken 607 blood pressure readings.  32 readings or 5% revealed blood pressure elevation 229 systolically for which he is took clonidine 5 times or 0.8%.    He has seen [name] on several occasions with significant blood pressure lability with blood pressure readings in excess of 798 systolically and diastolically over 90.  He also states at some times  his blood pressure may get low.  She had tried him on chlorthalidone, but a subsequent be met showed low potassium and sodium and it was discontinued.  At times he has noticed a sensation where he may have had transient atrial fibrillation.   When I last saw him, I tried to simplify his medications and recently he has been taking hydralazine 50 mg every 8 hours, valsartan 320 mg in the morning, Cardura 4 mg, amiodarone 100 mg and if he notes his blood pressure getting in excess of 200 he takes  0.1 mg clonidine PRN. At that time, it sounded like he may have had an episode of breakthrough atrial fibrillation and I slightly adjusted his amiodarone to 100 mg alternating with 200 mg every other day.  He brought with him his blood pressure data over the last several months including means and averages his blood pressure had been running in the 140-150 range, but there have been instances where his blood pressure is in excess of 200.  At his last office visit, I suggested the initiation of spironolactone which he took 12.5 mg for one week and then increase this to 12.5 mg twice a day.  This has resulted in significant stabilization of his blood pressure lability.    Since I last saw him, he apparently developed a small ischemic  CVA in 09/07/2017 and was hospitalized for 3 days was evaluated by Dr. Rigoberto Noel.  He presented with transient right hemianopia for 5-6 hours.  An MRI showed small cortical and subcortical left parieto-occipital infarct with petechial hemorrhagic transformation.  2.  The petechial hemorrhage is eloquent's was discontinued and he was started on aspirin for 7 days before plans to restart eliquis at 5 mg twice a day.  He was started on Lipitor 20 mg.  His neurologic symptoms completely resolved.  He has continued to have daily blood pressure readings as well as heart rate readings are charted out.  He did notice on January 11 that his mean pulse had risen to 7 wears previously was 62.  Several days later.  He apparently called the office and came 14 ECG.  At that time he was told that  his ECG did not show atrial fibrillation.  I was out of town, and ECG was apparently shown to another physician.  Since that time, he has noticed some mild irregularity to his pulse rate.  He believes his blood pressure has been fairly well controlled since spironolactone was added to his regimen.  He denies chest pain.  He did trip and fall on one occasion and landed on his wrists and has been wearing wrist support for stabilization.    When I saw him on 02/02/2017, his ECG demonstrated atrial flutter with variable block and a ventricular rate in the 80s.  There are nonspecific ST-T changes.  At that time, he had been taking amiodarone at 100 mg daily.  He was on anticoagulation with eliquis 5 mg bid.  At that time, I recommended initial titration of amiodarone back to 2 mg twice a day for 1 week and with his history of bradycardia in the past.  I recommended that after one week he reduce this back to 300 mg daily.  He has continued to monitor his heart rate of blood pressure recordings at home.  Laboratory was done on 03/09/2017.  Hemoglobin and hematocrit were stable, but he remain mildly anemic.  Creatinine was 1.33 which was stable.   LFTs were normal.  TSH was borderline increased at 5.7.  Lipid studies were excellent with a total cholesterol 122, and LDL 43.  He presents for reevaluation.  Allergies  Allergen Reactions  . Multaq [Dronedarone] Shortness Of Breath    Current Outpatient Prescriptions  Medication Sig Dispense Refill  . amiodarone (PACERONE) 200 MG tablet Take '200mg'$  (1 tablet) twice daily for 1 week, then '300mg'$  (1.5 tablets) daily 135 tablet 3  . atorvastatin (LIPITOR) 20 MG tablet Take 1 tablet (20 mg total) by mouth daily.    . COMBIGAN 0.2-0.5 % ophthalmic solution Place 1 drop into both eyes 2 (two) times daily.    Marland Kitchen docusate sodium (COLACE) 100 MG capsule Take 1 capsule (100 mg total) by mouth daily. 30 capsule 0  . doxazosin (CARDURA) 4 MG tablet TAKE 1 TABLET BY MOUTH EVERY DAY 30 tablet 11  . ELIQUIS 5 MG TABS tablet TAKE 1 TABLET BY MOUTH TWICE DAILY 60 tablet 10  . finasteride (PROSCAR) 5 MG tablet Take 5 mg by mouth daily.    . furosemide (LASIX) 20 MG tablet     . hydrALAZINE (APRESOLINE) 50 MG tablet TAKE 1 TABLET BY MOUTH EVERY 8 HOURS 270 tablet 1  . levothyroxine (SYNTHROID, LEVOTHROID) 50 MCG tablet Take 50 mcg by mouth daily.    Marland Kitchen LORazepam (ATIVAN) 1 MG tablet TAKE 1/2 TABLET OR 1 TABLET BY MOUTH AT BEDTIME AS NEEDED  2  . spironolactone (ALDACTONE) 25 MG tablet Take 0.5 tablets (12.5 mg total) by mouth 2 (two) times daily. 30 tablet 6   No current facility-administered medications for this visit.     Socially he is married and has 3 children he quit tobacco in 1978. 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  ROS General: Negative; No fevers, chills, or night sweats;  HEENT: Positive for history of glaucoma No changes in vision or hearing, sinus congestion, difficulty swallowing Pulmonary: Negative; No cough, wheezing, shortness of breath, hemoptysis Cardiovascular: Negative; No chest pain, presyncope, syncope, palpitations Positive for  intermittent leg swelling GI: Negative; No nausea, vomiting, diarrhea, or abdominal pain GU: Positive for renal cysts; No dysuria, hematuria, or difficulty voiding Musculoskeletal:  Negative; no myalgias, joint pain, or weakness Hematologic/Oncology: Negative; no easy bruising, bleeding Endocrine: Negative; no heat/cold intolerance; no diabetes Neuro: Negative; no changes in balance, headaches Skin: Negative; No rashes or skin lesions Psychiatric: Negative; No behavioral problems, depression Sleep: Negative; No snoring, daytime sleepiness, hypersomnolence, bruxism, restless legs, hypnogognic hallucinations, no cataplexy Other comprehensive 14 point system review is negative.    PE BP 134/73   Pulse (!) 57   Ht 5' 7.5" (1.715 m)   Wt 154 lb (69.9 kg)   BMI 23.76 kg/m    Repeat blood pressure by me was 130/70  Wt Readings from Last 3 Encounters:  03/17/17 154 lb (69.9 kg)  02/02/17 150 lb (68 kg)  01/06/17 154 lb (69.9 kg)   General: Alert, oriented, no distress.  Skin: normal turgor, no rashes HEENT: Normocephalic, atraumatic. Pupils round and reactive; sclera anicteric;no lid lag.  Nose without nasal septal hypertrophy Mouth/Parynx benign; Mallinpatti scale 3 Neck: No JVD, no carotid bruits with normal carotid upstroke. Chest wall: No tenderness to palpate Lungs: clear to ausculatation and percussion; no wheezing or rales Heart:  Bradycardic with regular rhythm at 57.  No ectopy.;  S1-S2 normal.  No S3 gallop. 1/6 sem; no diastolic murmur. No rubs thrills or heaves. Abdomen: soft, nontender; no hepatosplenomehaly, BS+; abdominal aorta nontender and not dilated by palpation. No definitive renal artery bruit Back: No CVA tenderness Pulses 2+ Extremities: no edema,  ;no clubbing cyanosis, Homan's sign negative  Neurologic: grossly nonfocal; cranial nerves grossly normal Psychologic: Normal affect and mood  ECG (independently read by me): Sinus bradycardia at 57 bpm with first  degree AV block with a PR interval of 256 ms.  No significant ST-T changes.  02/02/2017 ECG (independently read by me): Atrial flutter with variable block with ventricular rate in the 80s.  Nonspecific ST-T changes.  QTc interval 493 ms.  October 2017 ECG (independently read by me): Sinus bradycardia at 43 bpm with a meal.  First-degree block.  PAC.  QTc interval normal.  July 2017 ECG (independently read by me): Sinus rhythm but bradycardic in the 40s with sinus arrhythmia.  Nonspecific ST changes.  April 2017 ECG (independently read by me): August bradycardia at 53 bpm.  Sinus arrhythmia.  Nonspecific ST changes.  09/24/2015 ECG (independently read by me): Sinus bradycardia, first-degree AV block.  Ventricular rate 51.  PR interval 218 ms.  Nondiagnostic ST changes  May 2016 ECG (independently read by me): Sinus bradycardia 52 bpm.  PR interval 198 ms, QTc interval 451 ms.  Nonspecific interventricular conduction delay.  February 2016 ECG (independently read by me, and (: Sinus bradycardia 48 bpm.  Nonspecific conduction delay.  No significant ST abnormalities.  QTc interval 434 ms.  November 2015 ECG (independently read by me):  Sinus bradycardia 52 bpm.  PR interval 216 ms.  No ectopy.  July 2015ECG (independently read by me): Sinus bradycardia with first degree AV block.  PR interval 240 ms.  Nonspecific ST changes.  PAC.  Prior 01/10/2014 ECG (independently read by me) sinus bradycardia 53 beats per minute. QTc interval 457 ms. No ectopy.  Prior 12/12/2013 ECG  (independently read by me): Sinus rhythm at 56 beats per minute. PR interval 206 ms. QTc interval 470 ms   Prior ECG of 10/31/2013: Normal sinus rhythm at 61 beats per minute. Isolated  PVC.  Non-specific intra-ventricular conduction delay. Nonspecific ST-T changes. QTc interval 461 ms.  LABS:  BMP Latest Ref Rng & Units 03/09/2017 09/09/2016 09/08/2016  Glucose 65 -  99 mg/dL 88 95 -  BUN 8 - 27 mg/dL 20 21(H) -  Creatinine  0.76 - 1.27 mg/dL 1.33(H) 1.33(H) 1.54(H)  BUN/Creat Ratio 10 - 24 15 - -  Sodium 134 - 144 mmol/L 141 138 -  Potassium 3.5 - 5.2 mmol/L 4.4 4.3 -  Chloride 96 - 106 mmol/L 106 111 -  CO2 18 - 29 mmol/L 20 23 -  Calcium 8.6 - 10.2 mg/dL 8.6 8.4(L) -    Hepatic Function Latest Ref Rng & Units 03/09/2017 09/07/2016 06/05/2016  Total Protein 6.0 - 8.5 g/dL 5.8(L) 6.2(L) 5.9(L)  Albumin 3.5 - 4.7 g/dL 3.8 3.7 3.9  AST 0 - 40 IU/L '24 27 25  '$ ALT 0 - 44 IU/L '21 19 16  '$ Alk Phosphatase 39 - 117 IU/L 61 54 58  Total Bilirubin 0.0 - 1.2 mg/dL 1.3(H) 1.2 1.1    CBC Latest Ref Rng & Units 03/09/2017 09/07/2016 06/05/2016  WBC 3.4 - 10.8 x10E3/uL 5.2 5.1 3.9  Hemoglobin 13.0 - 17.0 g/dL - 11.6(L) -  Hematocrit 37.5 - 51.0 % 35.7(L) 34.3(L) 37.1(L)  Platelets 150 - 379 x10E3/uL 165 159 167   Lab Results  Component Value Date   MCV 97 03/09/2017   MCV 98.0 09/07/2016   MCV 98 (H) 06/05/2016   Lab Results  Component Value Date   TSH 5.790 (H) 03/09/2017   Lab Results  Component Value Date   HGBA1C 5.6 09/07/2016     Lipid Panel     Component Value Date/Time   CHOL 122 03/09/2017 0831   TRIG 75 03/09/2017 0831   HDL 64 03/09/2017 0831   CHOLHDL 3.1 09/08/2016 0507   VLDL 18 09/08/2016 0507   LDLCALC 43 03/09/2017 0831      RADIOLOGY: No results found.  IMPRESSION:  1. PAF (paroxysmal atrial fibrillation) ([place])   2. Typical atrial flutter ([place])   3. Sick sinus syndrome ([place])   4. Anticoagulated   5. Essential hypertension   6. Pure hypercholesterolemia     ASSESSMENT AND PLAN: Mr. Arnaud is an 83 year old white male who has a history of PAF and had been maintaining normal sinus rhythm on amiodarone.  When I last saw him 6 months ago, he was significantly bradycardic and I further reduced his amiodarone from 100/200 on every other day to 100 mg daily.  I reviewed his hospital records and office evaluation.  He developed transient right hemianopsia for 5-6 hours and on MRI imaging  and was found to have small right cortical and subcortical left parieto-occipital infarct with petechial hemorrhage.  His eliquis was held and he was treated with full dose aspirin for 1 week and ultimately eliquis was resumed by the neurologist at 5 mg twice a day.  In January, he notices pulse rate had increased.  I reviewed his ECG from January 22 and by my review, it appears that the patient was in atrial flutter at that time.  When I saw him on 02/02/2017, his ECG confirms atrial flutter with variable block.  I further titrated amiodarone to 40 mg a day for 1 week and since then he has been on 300 mg daily.  His ECG today confirms that he is back in sinus rhythm with a ventricular rate at 57.  His LFTs are normal.  TSH is minimally increased.  His blood pressure today continues to be controlled and on repeat by me was 130/70 on doxacosin 4 mg at bedtime, hydralazine 50 mg every 8 hours, and spironolactone 12.5 mg  daily.  He continues to be on levothyroxine 50 g for hypothyroidism.  He is tolerating atorvastatin and his most recent lipid studies are excellent with an LDL cholesterol at 43.  He will continue to monitor his blood pressure and heart rate at home.  I plan to keep him on the 3 mg dose for several months but ultimately we'll try to reduce him down to 200 mg daily.  As long as he remains stable, I will see him in August for follow-up evaluation.   Time spent: 25 minutes  Troy Sine, MD, [facility]  03/17/2017 5:56 PM

## 2017-03-23 DIAGNOSIS — M9902 Segmental and somatic dysfunction of thoracic region: Secondary | ICD-10-CM | POA: Diagnosis not present

## 2017-03-23 DIAGNOSIS — M9901 Segmental and somatic dysfunction of cervical region: Secondary | ICD-10-CM | POA: Diagnosis not present

## 2017-03-23 DIAGNOSIS — M47816 Spondylosis without myelopathy or radiculopathy, lumbar region: Secondary | ICD-10-CM | POA: Diagnosis not present

## 2017-03-23 DIAGNOSIS — M9903 Segmental and somatic dysfunction of lumbar region: Secondary | ICD-10-CM | POA: Diagnosis not present

## 2017-03-23 DIAGNOSIS — M546 Pain in thoracic spine: Secondary | ICD-10-CM | POA: Diagnosis not present

## 2017-03-23 DIAGNOSIS — M47812 Spondylosis without myelopathy or radiculopathy, cervical region: Secondary | ICD-10-CM | POA: Diagnosis not present

## 2017-03-26 DIAGNOSIS — M9902 Segmental and somatic dysfunction of thoracic region: Secondary | ICD-10-CM | POA: Diagnosis not present

## 2017-03-26 DIAGNOSIS — M546 Pain in thoracic spine: Secondary | ICD-10-CM | POA: Diagnosis not present

## 2017-03-26 DIAGNOSIS — M9903 Segmental and somatic dysfunction of lumbar region: Secondary | ICD-10-CM | POA: Diagnosis not present

## 2017-03-26 DIAGNOSIS — M9901 Segmental and somatic dysfunction of cervical region: Secondary | ICD-10-CM | POA: Diagnosis not present

## 2017-03-26 DIAGNOSIS — M47816 Spondylosis without myelopathy or radiculopathy, lumbar region: Secondary | ICD-10-CM | POA: Diagnosis not present

## 2017-03-26 DIAGNOSIS — M47812 Spondylosis without myelopathy or radiculopathy, cervical region: Secondary | ICD-10-CM | POA: Diagnosis not present

## 2017-03-31 DIAGNOSIS — M9901 Segmental and somatic dysfunction of cervical region: Secondary | ICD-10-CM | POA: Diagnosis not present

## 2017-03-31 DIAGNOSIS — M47816 Spondylosis without myelopathy or radiculopathy, lumbar region: Secondary | ICD-10-CM | POA: Diagnosis not present

## 2017-03-31 DIAGNOSIS — M47812 Spondylosis without myelopathy or radiculopathy, cervical region: Secondary | ICD-10-CM | POA: Diagnosis not present

## 2017-03-31 DIAGNOSIS — M546 Pain in thoracic spine: Secondary | ICD-10-CM | POA: Diagnosis not present

## 2017-03-31 DIAGNOSIS — M9903 Segmental and somatic dysfunction of lumbar region: Secondary | ICD-10-CM | POA: Diagnosis not present

## 2017-03-31 DIAGNOSIS — M9902 Segmental and somatic dysfunction of thoracic region: Secondary | ICD-10-CM | POA: Diagnosis not present

## 2017-04-08 DIAGNOSIS — M47816 Spondylosis without myelopathy or radiculopathy, lumbar region: Secondary | ICD-10-CM | POA: Diagnosis not present

## 2017-04-08 DIAGNOSIS — M9902 Segmental and somatic dysfunction of thoracic region: Secondary | ICD-10-CM | POA: Diagnosis not present

## 2017-04-08 DIAGNOSIS — M546 Pain in thoracic spine: Secondary | ICD-10-CM | POA: Diagnosis not present

## 2017-04-08 DIAGNOSIS — M9903 Segmental and somatic dysfunction of lumbar region: Secondary | ICD-10-CM | POA: Diagnosis not present

## 2017-04-08 DIAGNOSIS — M9901 Segmental and somatic dysfunction of cervical region: Secondary | ICD-10-CM | POA: Diagnosis not present

## 2017-04-08 DIAGNOSIS — M47812 Spondylosis without myelopathy or radiculopathy, cervical region: Secondary | ICD-10-CM | POA: Diagnosis not present

## 2017-04-15 DIAGNOSIS — M9903 Segmental and somatic dysfunction of lumbar region: Secondary | ICD-10-CM | POA: Diagnosis not present

## 2017-04-15 DIAGNOSIS — M47812 Spondylosis without myelopathy or radiculopathy, cervical region: Secondary | ICD-10-CM | POA: Diagnosis not present

## 2017-04-15 DIAGNOSIS — M47816 Spondylosis without myelopathy or radiculopathy, lumbar region: Secondary | ICD-10-CM | POA: Diagnosis not present

## 2017-04-15 DIAGNOSIS — M9901 Segmental and somatic dysfunction of cervical region: Secondary | ICD-10-CM | POA: Diagnosis not present

## 2017-04-15 DIAGNOSIS — M546 Pain in thoracic spine: Secondary | ICD-10-CM | POA: Diagnosis not present

## 2017-04-15 DIAGNOSIS — M9902 Segmental and somatic dysfunction of thoracic region: Secondary | ICD-10-CM | POA: Diagnosis not present

## 2017-04-29 DIAGNOSIS — M47812 Spondylosis without myelopathy or radiculopathy, cervical region: Secondary | ICD-10-CM | POA: Diagnosis not present

## 2017-04-29 DIAGNOSIS — M546 Pain in thoracic spine: Secondary | ICD-10-CM | POA: Diagnosis not present

## 2017-04-29 DIAGNOSIS — M47816 Spondylosis without myelopathy or radiculopathy, lumbar region: Secondary | ICD-10-CM | POA: Diagnosis not present

## 2017-04-29 DIAGNOSIS — M9901 Segmental and somatic dysfunction of cervical region: Secondary | ICD-10-CM | POA: Diagnosis not present

## 2017-04-29 DIAGNOSIS — M9902 Segmental and somatic dysfunction of thoracic region: Secondary | ICD-10-CM | POA: Diagnosis not present

## 2017-04-29 DIAGNOSIS — M9903 Segmental and somatic dysfunction of lumbar region: Secondary | ICD-10-CM | POA: Diagnosis not present

## 2017-04-30 ENCOUNTER — Other Ambulatory Visit (HOSPITAL_COMMUNITY): Payer: Self-pay | Admitting: Oncology

## 2017-05-12 ENCOUNTER — Ambulatory Visit (INDEPENDENT_AMBULATORY_CARE_PROVIDER_SITE_OTHER): Payer: Medicare Other | Admitting: Neurology

## 2017-05-12 ENCOUNTER — Other Ambulatory Visit (HOSPITAL_COMMUNITY): Payer: Self-pay | Admitting: Oncology

## 2017-05-12 ENCOUNTER — Encounter: Payer: Self-pay | Admitting: Neurology

## 2017-05-12 VITALS — BP 111/65 | HR 45 | Wt 155.0 lb

## 2017-05-12 DIAGNOSIS — I480 Paroxysmal atrial fibrillation: Secondary | ICD-10-CM | POA: Diagnosis not present

## 2017-05-12 DIAGNOSIS — I10 Essential (primary) hypertension: Secondary | ICD-10-CM

## 2017-05-12 DIAGNOSIS — I63412 Cerebral infarction due to embolism of left middle cerebral artery: Secondary | ICD-10-CM

## 2017-05-12 DIAGNOSIS — R200 Anesthesia of skin: Secondary | ICD-10-CM | POA: Diagnosis not present

## 2017-05-12 NOTE — Progress Notes (Signed)
STROKE NEUROLOGY FOLLOW UP NOTE  NAME: Dylan York DOB: 10-Apr-1926  REASON FOR VISIT: stroke follow up HISTORY FROM: pt and chart  Today we had the pleasure of seeing Dylan York in follow-up at our Neurology [facility]. Pt was accompanied by wife.   History Summary [name] is a 83 y.o. male with history of AF on eliquis 2.5mg  bid, sinus bradycardia, HTN, hypothyroidism and glaucoma admitted on 09/07/16 for transient right hemianopia for 5-6 hours. MRI showed small left cortical and subcortical parieto-occipital infarcts with petechial hemorrhagic transformation. MRA, CUS and TTE unremarkable. LEL 89 and A1C 5.6. His Cre 1.33, and his eliquis was underdosed. Due to petechial hemorrhage, he was put on ASA for 7 days before starting eliquis 5mg  bid. He was also discharged with lipitor 20mg .   10/02/16 follow up - the patient has been doing well. He is on eliquis 5mg  bid and lipitor without side effect. Followed with PCP and recent renal function check was told normal. BP today 117/61, well controlled at home. No complains. Currently is ready to be discharged from PT/OT.   01/06/17 follow up - the pt has been doing well except had a mechanical fall on 12/21/16. He hit her left forehead, both wrist and left flank. Went to [facility], had head CT negative. He was put on b/l wrist splint which were taken off yesterday after following with PCP. He still complain of intermittent right finger tingling and burning sensation especially at night. BP today 120/77. On eliquis 5mg  bid, no side effect.   Interval History During the interval time, pt has been doing well. Recent Cre 1.33 in 03/2017. He continued on regular dose of eliquis. Still complains of b/l wrist pain especially at night during sleep, fingertip numbness tingling. Saw chiropractor and doing laser rehab at b/l wrists. Complains some staggering on walking but no tendency to fall. BP today 111/65.   REVIEW OF SYSTEMS: Full 14  system review of systems performed and notable only for those listed below and in HPI above, all others are negative:  Constitutional:  fatigue Cardiovascular:  Ear/Nose/Throat:   Skin:  Eyes:   Respiratory:  Gastroitestinal:  Swollen abdomen Genitourinary: frequency of urination  Hematology/Lymphatic:  Bruise / bleed easily  Endocrine: cold intolerance  Musculoskeletal:  Walking difficulty Allergy/Immunology:   Neurological:  numbness Psychiatric:  Sleep: frequent waking  The following represents the patient's updated allergies and side effects list: Allergies  Allergen Reactions  . Multaq [Dronedarone] Shortness Of Breath    The neurologically relevant items on the patient's problem list were reviewed on today's visit.  Neurologic Examination  A problem focused neurological exam (12 or more points of the single system neurologic examination, vital signs counts as 1 point, cranial nerves count for 8 points) was performed.  Blood pressure 111/65, pulse (!) 45, weight 155 lb (70.3 kg).  General - Well nourished, well developed, in no apparent distress.  Ophthalmologic - Sharp disc margins OU.   Cardiovascular - Regular rate and rhythm.  Mental Status -  Level of arousal and orientation to time, place, and person were intact. Language including expression, naming, repetition, comprehension was assessed and found intact. Attention span and concentration were normal. Fund of Knowledge was assessed and was intact.  Cranial Nerves II - XII - II - Visual field intact OU. III, IV, VI - Extraocular movements intact. V - Facial sensation intact bilaterally. VII - Facial movement intact bilaterally. VIII - Hearing & vestibular intact bilaterally. X -  Palate elevates symmetrically. XI - Chin turning & shoulder shrug intact bilaterally. XII - Tongue protrusion intact.  Motor Strength - The patient's strength was normal in all extremities and pronator drift was absent.   Bulk was normal and fasciculations were absent.   Motor Tone - Muscle tone was assessed at the neck and appendages and was normal.  Reflexes - The patient's reflexes were 1+ in all extremities and he had no pathological reflexes.  Sensory - Light touch, temperature/pinprick were assessed and were symmetrical.    Coordination - The patient had normal movements in the hands with no ataxia or dysmetria.  Tremor was absent.  Gait and Station - The patient's transfers, posture, gait, station, and turns were observed as normal.   Data reviewed: I personally reviewed the images and agree with the radiology interpretations.  Ct Head Wo Contrast 09/07/2016 1.  No evidence of acute intracranial abnormality. 2. Involutional change and moderate chronic small vessel ischemia.   MRI HEAD  09/07/2016 1. Small volume acute cortical and subcortical left parieto-occipital ischemic infarcts as above. Associated petechial hemorrhage without frank hemorrhagic transformation. No associated mass effect. 2. Moderate chronic microvascular ischemic disease.   MRA HEAD  09/07/2016 Normal intracranial MRA for patient age. No large or proximal arterial branch occlusion. No high-grade or correctable stenosis.   Carotid Doppler   There is 1-39% bilateral ICA stenosis. Vertebral artery flow is antegrade.   2D Echocardiogram  - Left ventricle: The cavity size was normal. There was mild focalbasal hypertrophy of the septum. Systolic function was normal.The estimated ejection fraction was in the range of 55% to 60%.Wall motion was normal; there were no regional wall motionabnormalities. Doppler parameters are consistent with abnormalleft ventricular relaxation (grade 1 diastolic dysfunction). - Aortic valve: Trileaflet; mildly thickened, mildly calcifiedleaflets. There was mild regurgitation. - Mitral valve: Calcified annulus. - Left atrium: The atrium was moderately dilated. Volume/bsa, S:40.4 ml/m^2. -  Right ventricle: The cavity size was mildly dilated. Wallthickness was normal. Systolic function was mildly reduced. - Right atrium: The atrium was moderately dilated. - Tricuspid valve: There was moderate regurgitation. - Pulmonary arteries: Systolic pressure was moderately increased.PA peak pressure: 55 mm Hg (S). Impressions:  Compared to the prior study, there has been no significantinterval change.  Component     Latest Ref Rng & Units 09/07/2016 09/08/2016  Cholesterol     0 - 200 mg/dL  159  Triglycerides     <150 mg/dL  92  HDL Cholesterol     >40 mg/dL  52  Total CHOL/HDL Ratio     RATIO  3.1  VLDL     0 - 40 mg/dL  18  LDL (calc)     0 - 99 mg/dL  89  Hemoglobin A1C     4.8 - 5.6 % 5.6   Mean Plasma Glucose     mg/dL 114   TSH     0.350 - 4.500 uIU/mL 2.173     Assessment: As you may recall, he is a 83 y.o. Caucasian male with PMH of history of AF on eliquis 2.5mg  bid, sinus bradycardia, HTN, hypothyroidism and glaucoma admitted on 09/07/16 for transient right hemianopia for 5-6 hours. MRI showed small right cortical and subcortical parieto-occipital infarcts with petechial hemorrhagic transformation. MRA, CUS and TTE unremarkable. LDL 89 and A1C 5.6. His Cre 1.33, and his eliquis was underdosed. Due to petechial hemorrhage, he was put on ASA for 7 days before starting eliquis 5mg  bid. He was also discharged with lipitor  20mg . Followed with PCP for renal function monitoring. During the interval time, he is doing well. Has bilateral wrist pain and finger tingling burning, as well as staggering on walking, will do EMG/NMCS.   Plan:  - continue eliquis 5mg  twice a day and lipitor for stroke prevention. Follow up with PCP for renal function monitoring. - Follow up with your primary care physician for stroke risk factor modification. Recommend maintain blood pressure goal <130/80, diabetes with hemoglobin A1c goal below 7.0% and lipids with LDL cholesterol goal below 70 mg/dL.    - check BP at home and record - follow up with cardiology as scheduled. - will do EMG/NCS for b/l wrist pain and fingertip numbness.  - home exercise and avoid fall. - follow up in 6 months  I spent more than 25 minutes of face to face time with the patient. Greater than 50% of time was spent in counseling and coordination of care. We discussed about renal function monitoring, EMG/NCS and avoid falls.   Orders Placed This Encounter  Procedures  . NCV with EMG(electromyography)    B/l wrist pain and finger tip numbness.    Standing Status:   Future    Standing Expiration Date:   05/12/2018    Order Specific Question:   Where should this test be performed?    Answer:   [facility]    No orders of the defined types were placed in this encounter.   Patient Instructions  - continue eliquis 5mg  twice a day and lipitor for stroke prevention. Follow up with PCP for renal function monitoring. - Follow up with your primary care physician for stroke risk factor modification. Recommend maintain blood pressure goal <130/80, diabetes with hemoglobin A1c goal below 7.0% and lipids with LDL cholesterol goal below 70 mg/dL.  - check BP at home and record - follow up with cardiology as scheduled. - will do EMG/NCS for b/l wrist pain and fingertip numbness.  - home exercise and avoid fall. - follow up in 6 months with me   Rosalin Hawking, MD PhD [facility] [address], [place], [place] [address] [phone]

## 2017-05-12 NOTE — Patient Instructions (Addendum)
-   continue eliquis 5mg  twice a day and lipitor for stroke prevention. Follow up with PCP for renal function monitoring. - Follow up with your primary care physician for stroke risk factor modification. Recommend maintain blood pressure goal <130/80, diabetes with hemoglobin A1c goal below 7.0% and lipids with LDL cholesterol goal below 70 mg/dL.  - check BP at home and record - follow up with cardiology as scheduled. - will do EMG/NCS for b/l wrist pain and fingertip numbness.  - home exercise and avoid fall. - follow up in 6 months with me

## 2017-05-13 ENCOUNTER — Other Ambulatory Visit (HOSPITAL_COMMUNITY): Payer: Self-pay | Admitting: Oncology

## 2017-05-13 ENCOUNTER — Other Ambulatory Visit: Payer: Self-pay | Admitting: Cardiovascular Disease

## 2017-05-13 DIAGNOSIS — M9902 Segmental and somatic dysfunction of thoracic region: Secondary | ICD-10-CM | POA: Diagnosis not present

## 2017-05-13 DIAGNOSIS — M9901 Segmental and somatic dysfunction of cervical region: Secondary | ICD-10-CM | POA: Diagnosis not present

## 2017-05-13 DIAGNOSIS — M546 Pain in thoracic spine: Secondary | ICD-10-CM | POA: Diagnosis not present

## 2017-05-13 DIAGNOSIS — M47812 Spondylosis without myelopathy or radiculopathy, cervical region: Secondary | ICD-10-CM | POA: Diagnosis not present

## 2017-05-13 DIAGNOSIS — M9903 Segmental and somatic dysfunction of lumbar region: Secondary | ICD-10-CM | POA: Diagnosis not present

## 2017-05-13 DIAGNOSIS — M47816 Spondylosis without myelopathy or radiculopathy, lumbar region: Secondary | ICD-10-CM | POA: Diagnosis not present

## 2017-05-21 ENCOUNTER — Encounter (INDEPENDENT_AMBULATORY_CARE_PROVIDER_SITE_OTHER): Payer: Self-pay | Admitting: Diagnostic Neuroimaging

## 2017-05-21 ENCOUNTER — Ambulatory Visit (INDEPENDENT_AMBULATORY_CARE_PROVIDER_SITE_OTHER): Payer: Medicare Other | Admitting: Diagnostic Neuroimaging

## 2017-05-21 DIAGNOSIS — R200 Anesthesia of skin: Secondary | ICD-10-CM

## 2017-05-21 DIAGNOSIS — Z0289 Encounter for other administrative examinations: Secondary | ICD-10-CM

## 2017-05-22 DIAGNOSIS — G4700 Insomnia, unspecified: Secondary | ICD-10-CM | POA: Diagnosis not present

## 2017-05-22 DIAGNOSIS — I10 Essential (primary) hypertension: Secondary | ICD-10-CM | POA: Diagnosis not present

## 2017-05-22 DIAGNOSIS — E039 Hypothyroidism, unspecified: Secondary | ICD-10-CM | POA: Diagnosis not present

## 2017-05-22 DIAGNOSIS — I480 Paroxysmal atrial fibrillation: Secondary | ICD-10-CM | POA: Diagnosis not present

## 2017-05-22 DIAGNOSIS — I639 Cerebral infarction, unspecified: Secondary | ICD-10-CM | POA: Diagnosis not present

## 2017-05-22 DIAGNOSIS — Z299 Encounter for prophylactic measures, unspecified: Secondary | ICD-10-CM | POA: Diagnosis not present

## 2017-05-22 NOTE — Procedures (Signed)
[facility]  NCS (NERVE CONDUCTION STUDY) WITH EMG (ELECTROMYOGRAPHY) REPORT   STUDY DATE: 05/21/17 PATIENT NAME: INFANT ZINK DOB: 10/11/1926 MRN: [id number]  ORDERING CLINICIAN: Rosalin Hawking, MD PhD   TECHNOLOGIST: Oneita Jolly ELECTROMYOGRAPHER: Earlean Polka. Azaleah Usman, MD  CLINICAL INFORMATION: 83 year old male with bilateral hand numbness after falling down onto his hands.  FINDINGS: NERVE CONDUCTION STUDY: Right median motor response has prolonged distal latency (15.3 ms) decreased amplitude, borderline slow conduction velocity.  Left median motor response has prolonged distal latency (6.4 ms), decreased amplitude, borderline slow conduction velocity.  Bilateral ulnar motor responses have normal distal latencies, amplitudes, borderline slow conduction velocities.  Right median sensory response could not be obtained.  Left median sensory response has prolonged peak latency and decreased amplitude.  Bilateral ulnar sensory responses are normal.   NEEDLE ELECTROMYOGRAPHY:  Right deltoid, biceps, triceps, flexor carpi radialis, first dorsal interosseous muscles are normal.   IMPRESSION:  Abnormal study demonstrating: - Bilateral median neuropathies at the wrist consistent with bilateral carpal tunnel syndrome. Given the clinical context this may be posttraumatic. Electrodiagnostically this is more severe on the right than the left side.     INTERPRETING PHYSICIAN:  Penni Bombard, MD Certified in Neurology, Neurophysiology and Neuroimaging  [facility] [address], [place], [place] [address] [phone]   [facility]    Nerve / Sites Muscle Latency Ref. Amplitude Ref. Rel Amp Segments Distance Velocity Ref. Area    ms ms mV mV %  cm m/s m/s mVms  R Median - APB     Wrist APB 15.3 ?4.4 0.5 ?4.0 100 Wrist - APB 7   2.5     Upper arm APB 20.9  0.6  121 Upper arm - Wrist 26 46 ?49 3.7  L Median - APB     Wrist APB  6.4 ?4.4 3.9 ?4.0 100 Wrist - APB 7   13.2     Upper arm APB 12.0  4.2  109 Upper arm - Wrist 26 46 ?49 14.6  R Ulnar - ADM     Wrist ADM 2.8 ?3.3 6.3 ?6.0 100 Wrist - ADM 7   19.0     B.Elbow ADM 7.1  5.8  91.2 B.Elbow - Wrist 19 44 ?49 17.9     A.Elbow ADM 9.6  5.6  96.9 A.Elbow - B.Elbow 12 47 ?49 17.8         A.Elbow - Wrist      L Ulnar - ADM     Wrist ADM 2.8 ?3.3 6.2 ?6.0 100 Wrist - ADM 7   18.1     B.Elbow ADM 6.9  5.7  92.8 B.Elbow - Wrist 18 44 ?49 17.5     A.Elbow ADM 9.5  5.3  92.2 A.Elbow - B.Elbow 12 46 ?49 16.2         A.Elbow - Wrist                 SNC    Nerve / Sites Rec. Site Peak Lat Ref.  Amp Ref. Segments Distance    ms ms V V  cm  R Median - Orthodromic (Dig II, Mid palm)     Dig II Wrist NR ?3.4 NR ?10 Dig II - Wrist 13  L Median - Orthodromic (Dig II, Mid palm)     Dig II Wrist 6.4 ?3.4 5 ?10 Dig II - Wrist 13  R Ulnar - Orthodromic, (Dig V, Mid palm)     Dig V  Wrist 2.9 ?3.1 6 ?5 Dig V - Wrist 11  L Ulnar - Orthodromic, (Dig V, Mid palm)     Dig V Wrist 3.0 ?3.1 6 ?5 Dig V - Wrist 39             F  Wave    Nerve F Lat Ref.   ms ms  R Ulnar - ADM 33.6 ?32.0  L Ulnar - ADM 32.1 ?32.0         EMG full       EMG Summary Table    Spontaneous MUAP Recruitment  Muscle IA Fib PSW Fasc Other Amp Dur. Poly Pattern  R. Deltoid Normal None None None _______ Normal Normal Normal Normal  R. Biceps brachii Normal None None None _______ Normal Normal Normal Normal  R. Triceps brachii Normal None None None _______ Normal Normal Normal Normal  R. Flexor carpi radialis Normal None None None _______ Normal Normal Normal Normal  R. First dorsal interosseous Normal None None None _______ Normal Normal Normal Normal

## 2017-05-25 ENCOUNTER — Telehealth: Payer: Self-pay

## 2017-05-25 NOTE — Telephone Encounter (Signed)
Rn call patient about his nerve conduction test. Rn stated per Dr. Erlinda Hong conduction test done recently in our office confirmed my suspicion that pt had b/l carpal tunnel syndrome. This is able to explain his symptoms. So far his hand strength is fine so no surgery needed at this time. continue the wrist brace that I have talked about during the last visit and to see whether it helps. Pt stated he does wear splints at night time, and during the day. Rn also recommend he does not lift anything heavy until he heals. PT stated the carpal tunnel is getting better. Pt will call back if he has any concerns. Pt verbalized understanding.

## 2017-05-25 NOTE — Telephone Encounter (Signed)
-----   Message from Rosalin Hawking, MD sent at 05/22/2017  1:41 PM EDT ----- Could you please let the patient know that the nerve conduction test done recently in our office confirmed my suspicion that pt had b/l carpal tunnel syndrome. This is able to explain his symptoms. So far his hand strength is fine so no surgery needed at this time. continue the wrist brace that I have talked about during the last visit and to see whether it helps. Please give [place] a call if there is any questions. Thanks.  Rosalin Hawking, MD PhD Stroke Neurology 05/22/2017 1:41 PM

## 2017-05-26 DIAGNOSIS — E039 Hypothyroidism, unspecified: Secondary | ICD-10-CM | POA: Diagnosis not present

## 2017-05-26 DIAGNOSIS — H6121 Impacted cerumen, right ear: Secondary | ICD-10-CM | POA: Diagnosis not present

## 2017-05-26 DIAGNOSIS — H6980 Other specified disorders of Eustachian tube, unspecified ear: Secondary | ICD-10-CM | POA: Diagnosis not present

## 2017-05-26 DIAGNOSIS — Z6824 Body mass index (BMI) 24.0-24.9, adult: Secondary | ICD-10-CM | POA: Diagnosis not present

## 2017-05-26 DIAGNOSIS — Z299 Encounter for prophylactic measures, unspecified: Secondary | ICD-10-CM | POA: Diagnosis not present

## 2017-05-26 DIAGNOSIS — I480 Paroxysmal atrial fibrillation: Secondary | ICD-10-CM | POA: Diagnosis not present

## 2017-05-26 DIAGNOSIS — D709 Neutropenia, unspecified: Secondary | ICD-10-CM | POA: Diagnosis not present

## 2017-05-26 DIAGNOSIS — I10 Essential (primary) hypertension: Secondary | ICD-10-CM | POA: Diagnosis not present

## 2017-05-26 DIAGNOSIS — I471 Supraventricular tachycardia: Secondary | ICD-10-CM | POA: Diagnosis not present

## 2017-05-27 DIAGNOSIS — M9902 Segmental and somatic dysfunction of thoracic region: Secondary | ICD-10-CM | POA: Diagnosis not present

## 2017-05-27 DIAGNOSIS — M47816 Spondylosis without myelopathy or radiculopathy, lumbar region: Secondary | ICD-10-CM | POA: Diagnosis not present

## 2017-05-27 DIAGNOSIS — M9903 Segmental and somatic dysfunction of lumbar region: Secondary | ICD-10-CM | POA: Diagnosis not present

## 2017-05-27 DIAGNOSIS — M47812 Spondylosis without myelopathy or radiculopathy, cervical region: Secondary | ICD-10-CM | POA: Diagnosis not present

## 2017-05-27 DIAGNOSIS — M546 Pain in thoracic spine: Secondary | ICD-10-CM | POA: Diagnosis not present

## 2017-05-27 DIAGNOSIS — M9901 Segmental and somatic dysfunction of cervical region: Secondary | ICD-10-CM | POA: Diagnosis not present

## 2017-06-01 DIAGNOSIS — Z6824 Body mass index (BMI) 24.0-24.9, adult: Secondary | ICD-10-CM | POA: Diagnosis not present

## 2017-06-01 DIAGNOSIS — I10 Essential (primary) hypertension: Secondary | ICD-10-CM | POA: Diagnosis not present

## 2017-06-01 DIAGNOSIS — Z299 Encounter for prophylactic measures, unspecified: Secondary | ICD-10-CM | POA: Diagnosis not present

## 2017-06-01 DIAGNOSIS — K921 Melena: Secondary | ICD-10-CM | POA: Diagnosis not present

## 2017-06-01 DIAGNOSIS — N400 Enlarged prostate without lower urinary tract symptoms: Secondary | ICD-10-CM | POA: Diagnosis not present

## 2017-06-01 DIAGNOSIS — H6120 Impacted cerumen, unspecified ear: Secondary | ICD-10-CM | POA: Diagnosis not present

## 2017-06-01 DIAGNOSIS — D649 Anemia, unspecified: Secondary | ICD-10-CM | POA: Diagnosis not present

## 2017-06-02 DIAGNOSIS — D649 Anemia, unspecified: Secondary | ICD-10-CM | POA: Diagnosis not present

## 2017-06-09 ENCOUNTER — Ambulatory Visit (INDEPENDENT_AMBULATORY_CARE_PROVIDER_SITE_OTHER): Payer: Medicare Other | Admitting: Internal Medicine

## 2017-06-09 ENCOUNTER — Encounter (INDEPENDENT_AMBULATORY_CARE_PROVIDER_SITE_OTHER): Payer: Self-pay | Admitting: Internal Medicine

## 2017-06-09 VITALS — BP 140/94 | HR 60 | Temp 98.3°F | Resp 18 | Ht 68.0 in | Wt 150.4 lb

## 2017-06-09 DIAGNOSIS — K5900 Constipation, unspecified: Secondary | ICD-10-CM | POA: Diagnosis not present

## 2017-06-09 DIAGNOSIS — I63412 Cerebral infarction due to embolism of left middle cerebral artery: Secondary | ICD-10-CM

## 2017-06-09 DIAGNOSIS — R143 Flatulence: Secondary | ICD-10-CM

## 2017-06-09 MED ORDER — DOCUSATE SODIUM 100 MG PO CAPS: 100.0000 mg | ORAL_CAPSULE | Freq: Two times a day (BID) | ORAL | 0 refills | Status: DC

## 2017-06-09 NOTE — Progress Notes (Signed)
Presenting complaint;  Follow-up for flatulence and constipation.  Subjective:  Patient is 83 year old Caucasian male was here for scheduled visit. He was last seen in for every 2018. He has history of small intestinal bacterial overgrowth diagnosed in March 2017 and he has been treated with antibiotic on 2 different occasions with symptomatic improvement. He continues to complain of flatulence. However it is not as bad as was before. He has no control. He feels Phazyme is helping. He continues to experience constipation. Last week he went 6 days without a bowel movement. He says he passed dark fecal ball and he took stool sample to Dr. Woody Seller in stool was guaiac negative. Now he has gone 3 days without a BM. He had CVA in November 2017 and he feels he has fully recovered. He fell in February and injured both wrists. He has been diagnosed with bilateral carpal tunnel syndrome. He is using Colace on as-needed basis. He states he has seepage usually when he passes normal stool. He denies melena or rectal bleeding. He is also worried about anemia but his last hemoglobin was 12.2 g. He also complains of if occult see with his gait. He states he staggers. Patient will bring it up to his neurologist on his next visit. His appetite is fair and he has lost 2 pounds since his last visit. He tells me he has several pills of omeprazole and wonders if he should go back on it.  Current Medications: Outpatient Encounter Prescriptions as of 06/09/2017  Medication Sig  . amiodarone (PACERONE) 200 MG tablet Take 200mg  (1 tablet) twice daily for 1 week, then 300mg  (1.5 tablets) daily  . atorvastatin (LIPITOR) 20 MG tablet Take 1 tablet (20 mg total) by mouth daily.  . COMBIGAN 0.2-0.5 % ophthalmic solution Place 1 drop into both eyes 2 (two) times daily.  Marland Kitchen docusate sodium (COLACE) 100 MG capsule Take 1 capsule (100 mg total) by mouth daily.  Marland Kitchen doxazosin (CARDURA) 4 MG tablet TAKE 1 TABLET BY MOUTH EVERY DAY  . ELIQUIS 5  MG TABS tablet TAKE 1 TABLET BY MOUTH TWICE DAILY  . finasteride (PROSCAR) 5 MG tablet Take 5 mg by mouth daily.  . hydrALAZINE (APRESOLINE) 50 MG tablet TAKE 1 TABLET BY MOUTH EVERY 8 HOURS  . levothyroxine (SYNTHROID, LEVOTHROID) 50 MCG tablet Take 50 mcg by mouth daily.  Marland Kitchen LORazepam (ATIVAN) 1 MG tablet TAKE 1/2 TABLET OR 1 TABLET BY MOUTH AT BEDTIME AS NEEDED  . Multiple Vitamins-Minerals (MULTIVITAMIN PO) Take by mouth daily.  Marland Kitchen NASACORT ALLERGY 24HR 55 MCG/ACT AERO nasal inhaler USE ONE SPRAY TWICE DAILY  . Simethicone (PHAZYME) 180 MG CAPS Take by mouth daily.  Marland Kitchen spironolactone (ALDACTONE) 25 MG tablet Take 0.5 tablets (12.5 mg total) by mouth 2 (two) times daily.   No facility-administered encounter medications on file as of 06/09/2017.      Objective: Blood pressure (!) 140/94, pulse 60, temperature 98.3 F (36.8 C), temperature source Oral, resp. rate 18, height 5\' 8"  (1.727 m), weight 150 lb 6.4 oz (68.2 kg). Patient is alert and in no acute distress. Conjunctiva is pink. Sclera is nonicteric Oropharyngeal mucosa is normal. No neck masses or thyromegaly noted. Cardiac exam with regular rhythm normal S1 and S2. No murmur or gallop noted. Lungs are clear to auscultation. Abdomen is full. He has reversed T-shaped scar in mid abdomen. He has hernia. Linea alba. Bowel sounds are normal. No bruits noted. On palpation abdomen is soft and nontender without organomegaly or masses.  No LE edema or clubbing noted. He has bilateral wrist Ace wrap.   Assessment:  #1. Flatulence. He is better with symptomatic therapy. If flatulence becomes intractable will treat him with antibiotic given history of small intestinal bacterial overgrowth.  #2. Chronic constipation. He needs to be taking stool softener every day. He should have a bowel movement at least every other day.   Plan:  Patient advised to take Colace 100 g by mouth twice a day. He should continue high fiber diet. He should use  Dulcolax suppository if he goes 2 days without a bowel movement so that he would not develop fecal impaction. If dietary measures and Colace ineffective he can try polyethylene glycol at a low dose. He will return for office visit in one year or earlier if needed.

## 2017-06-09 NOTE — Patient Instructions (Signed)
Can use Dulcolax suppository on as-needed basis.

## 2017-06-15 ENCOUNTER — Telehealth: Payer: Self-pay | Admitting: Cardiovascular Disease

## 2017-06-15 NOTE — Telephone Encounter (Signed)
Returned call to patient. He has enough Eliquis for 2 weeks. He has a prescription that he can get filled but it will be expensive. He has an appt tomorrow 06/16/17 with Dr. Claiborne Billings - advised to inquire about samples then as a rep may have come by.

## 2017-06-15 NOTE — Telephone Encounter (Signed)
New message   Patient calling the office for samples of medication:   1.  What medication and dosage are you requesting samples for?ELIQUIS 5 MG TABS tablet  2.  Are you currently out of this medication? yes   

## 2017-06-16 ENCOUNTER — Ambulatory Visit (INDEPENDENT_AMBULATORY_CARE_PROVIDER_SITE_OTHER): Payer: Medicare Other | Admitting: Cardiovascular Disease

## 2017-06-16 ENCOUNTER — Encounter: Payer: Self-pay | Admitting: Cardiovascular Disease

## 2017-06-16 VITALS — BP 140/74 | HR 47 | Ht 68.0 in | Wt 151.0 lb

## 2017-06-16 DIAGNOSIS — Z7901 Long term (current) use of anticoagulants: Secondary | ICD-10-CM | POA: Diagnosis not present

## 2017-06-16 DIAGNOSIS — I483 Typical atrial flutter: Secondary | ICD-10-CM | POA: Diagnosis not present

## 2017-06-16 DIAGNOSIS — I480 Paroxysmal atrial fibrillation: Secondary | ICD-10-CM | POA: Diagnosis not present

## 2017-06-16 DIAGNOSIS — I63412 Cerebral infarction due to embolism of left middle cerebral artery: Secondary | ICD-10-CM

## 2017-06-16 DIAGNOSIS — I10 Essential (primary) hypertension: Secondary | ICD-10-CM | POA: Diagnosis not present

## 2017-06-16 DIAGNOSIS — I495 Sick sinus syndrome: Secondary | ICD-10-CM | POA: Diagnosis not present

## 2017-06-16 DIAGNOSIS — E039 Hypothyroidism, unspecified: Secondary | ICD-10-CM | POA: Diagnosis not present

## 2017-06-16 MED ORDER — AMIODARONE HCL 200 MG PO TABS: ORAL_TABLET | ORAL | 3 refills | Status: DC

## 2017-06-16 NOTE — Patient Instructions (Signed)
Your physician has recommended you make the following change in your medication:   1.) the amiodarone has been changed to 200 mg on even days and 300 mg on odd days. A new prescription has been sent to your pharmacy to reflect this change.  Your physician wants you to follow-up in: 6 months or sooner if needed. You will receive a reminder letter in the mail two months in advance. If you don't receive a letter, please call our office to schedule the follow-up appointment.  If you need a refill on your cardiac medications before your next appointment, please call your pharmacy.

## 2017-06-16 NOTE — Progress Notes (Signed)
Patient ID: Dylan York, male   DOB: [birth year], 83 y.o.   MRN: [id number]     HPI: Dylan York is a 83 y.o. male who presents to the office for 3 month  followup cardiology evaluation.  Dylan York has a history of sick sinus syndrome with atrial tachycardia as well as sinus bradycardia, ventricular bigeminy, PVCs, PAF and difficult to control hypertension  He had stabilized with Bystolic but when he was seen on 03/08/2013 he was in atrial fibrillation of questionable duration. At that time I increased his Bystolic and his started anticoagulation with Eliquis 2.5 mg twice a day. An echo Doppler study on 03/30/2013 revealed normal systolic function with an ejection fraction of 60-65%, aortic valve sclerosis, biatrial enlargement, as well as mild pulmonary hypertension with a PA pressure of 41 mm. On 04/04/2013 AF 77 rate was beats per minute. He was initially started  on Multaq 400 mg twice a day but did not tolerate this and ultimately discontinue its use.  When  l saw him on 04/26/2013 I started him on amiodarone 200 mg daily. He has tolerated this well. 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  On 05/27/2013 I further titrated his amiodarone to 200 mg twice a day. I also recommended that he reduce his Bystolic from 10 to 5 mg with a plan to do a cardioversion but as part of his preoperative assessment he was found to have pneumonia. His  pneumonia was treated with antibiotic therapy by Dr. Woody Seller in [place]. On 08/05/2013 he was seen in the office as an add-on at which time he had significant accelerated hypertension with a blood pressure of 220/100. He was seen by [name] and admitted to [facility] hospital. He  was treated with IV Cardene and he converted to sinus rhythm. Ultimately nicardipine was discontinued and amlodipine was started as well as hydralazine. He was discharged home on 08/09/2013.  His heart rhythm has been fairly stable. He has noticed some mild ankle swelling right greater than left. Lower extremityDoppler studies were negative for DVT. On 08/23/2013  amlodipine was reduced from 10 to7.5 mg and I reduced his amiodarone from 400 mg to 300 mg. Subsequently, he now is on a further reduce dose of amlodipine at just 2.5 mg daily. He does note continued increasing lower extremity edema, right leg greater than left. He has not been taking his furosemide  20 mg regularly.  On 10/31/2013 I discontinued his amlodipine and recommended furosemide 20 mg daily due to his significant  pretibial and ankle edema. We also discussed sodium restriction. Since that time he has been using 20 -30 mm  support stockings below the knee with marked benefit.  A renal duplex scan in February 2015 demonstrated right renal artery narrowing proximally in the range of 1-59% with velocities at 213, suggesting the upper end of the range.  His left renal artery had normal pain. He  He has numerous large anechoic cysts in both kidneys with the largest measuring 8.9 x 7.6 x 8.0.  He is also followed by [name] urologically.  On 01/12/2015.  He underwent a follow-up renal duplex scan.  He again had mild bilateral renal artery narrowing at the right renal artery.  Increased velocity proximally at 185 in the left renal artery.  A 206 consistent with a 159% range diameter reduction.  The left kidney again showed numerous  an echo.  It cysts.  The 2 largest measured 7.08.3 x a 8.0 cm and [id number] cm.  Left kidney could not accurately be measured in length.  Due to large cyst within the lower pole of the kidney.  Dylan York has had problems with significant blood pressure lability and when seen in the past was taking multiple medications at different times throughout the day.  At that time he was taking Cardura 4 mg daily, hydralazine 50 mg twice a day , and an another dose at 25 mg, furosemide 20 mg daily, valsartan 320  mg.  He also is on levothyroxine 50 g for hypothyroidism.  He takes clonidine 0.1 mg if his blood pressure gets above 962 systolically.  He is also continuing to take amiodarone at 100 mg alternating with 200 mg and denies recurrent AF.  He is anticoagulated with eliquis.  He is very analytical and over the past 6 months he states that he has taken 607 blood pressure readings.  32 readings or 5% revealed blood pressure elevation 229 systolically for which he is took clonidine 5 times or 0.8%.    He has seen [name] on several occasions with significant blood pressure lability with blood pressure readings in excess of 798 systolically and diastolically over 90.  He also states at some times  his blood pressure may get low.  She had tried him on chlorthalidone, but a subsequent be met showed low potassium and sodium and it was discontinued.  At times he has noticed a sensation where he may have had transient atrial fibrillation.   When I last saw him, I tried to simplify his medications and recently he has been taking hydralazine 50 mg every 8 hours, valsartan 320 mg in the morning, Cardura 4 mg, amiodarone 100 mg and if he notes his blood pressure getting in excess of 200 he takes  0.1 mg clonidine PRN. At that time, it sounded like he may have had an episode of breakthrough atrial fibrillation and I slightly adjusted his amiodarone to 100 mg alternating with 200 mg every other day.  He brought with him his blood pressure data over the last several months including means and averages his blood pressure had been running in the 140-150 range, but there have been instances where his blood pressure is in excess of 200.  At his last office visit, I suggested the initiation of spironolactone which he took 12.5 mg for one week and then increase this to 12.5 mg twice a day.  This has resulted in significant stabilization of his blood pressure lability.    Since I last saw him, he apparently developed a small ischemic  CVA in 09/07/2017 and was hospitalized for 3 days was evaluated by Dr. Rigoberto Noel.  He presented with transient right hemianopia for 5-6 hours.  An MRI showed small cortical and subcortical left parieto-occipital infarct with petechial hemorrhagic transformation.  2.  The petechial hemorrhage is eloquent's was discontinued and he was started on aspirin for 7 days before plans to restart eliquis at 5 mg twice a day.  He was started on Lipitor 20 mg.  His neurologic symptoms completely resolved.  He has continued to have daily blood pressure readings as well as heart rate readings are charted out.  He did notice on January 11 that his mean pulse had risen to 7 wears previously was 62.  Several days later.  He apparently called the office and came 14 ECG.  At that time he was told that  his ECG did not show atrial fibrillation.  I was out of town, and ECG was apparently shown to another physician.  Since that time, he has noticed some mild irregularity to his pulse rate.  He believes his blood pressure has been fairly well controlled since spironolactone was added to his regimen.  He denies chest pain.  He did trip and fall on one occasion and landed on his wrists and has been wearing wrist support for stabilization.    When I saw him on 02/02/2017, his ECG demonstrated atrial flutter with variable block and a ventricular rate in the 80s.  There are nonspecific ST-T changes.  At that time, he had been taking amiodarone at 100 mg daily.  He was on anticoagulation with eliquis 5 mg bid.  At that time, I recommended initial titration of amiodarone back to 2 mg twice a day for 1 week and with his history of bradycardia in the past.  I recommended that after one week he reduce this back to 300 mg daily.  He has continued to monitor his heart rate of blood pressure recordings at home.  Laboratory was done on 03/09/2017.  Hemoglobin and hematocrit were stable, but he remain mildly anemic.  Creatinine was 1.33 which was stable.   LFTs were normal.  TSH was borderline increased at 5.7.  Lipid studies were excellent with a total cholesterol 122, and LDL 43.   Since I last saw him, he has felt well.  He has continued to monitor his blood pressure and heart rate at home and as usual.  He brought with him.  Grafts and data points from over the last several months.  His mean blood pressures have been in the 110-120 range and pulse rate typically at 56.  He denies awareness of recurrent atrial fibrillation.  He denies palpitations.  He recently had noted dark stools but on evaluation, this was heme-negative.  He presents for evaluation.  Allergies  Allergen Reactions  . Multaq [Dronedarone] Shortness Of Breath    Current Outpatient Prescriptions  Medication Sig Dispense Refill  . amiodarone (PACERONE) 200 MG tablet Take '200mg'$  (1 tablet) on even days and 300 mg on odd days ( 1.5 tablet) 135 tablet 3  . atorvastatin (LIPITOR) 20 MG tablet Take 1 tablet (20 mg total) by mouth daily.    . COMBIGAN 0.2-0.5 % ophthalmic solution Place 1 drop into both eyes 2 (two) times daily.    Marland Kitchen docusate sodium (COLACE) 100 MG capsule Take 1 capsule (100 mg total) by mouth 2 (two) times daily. 60 capsule 0  . doxazosin (CARDURA) 4 MG tablet TAKE 1 TABLET BY MOUTH EVERY DAY 30 tablet 11  . ELIQUIS 5 MG TABS tablet TAKE 1 TABLET BY MOUTH TWICE DAILY 60 tablet 10  . finasteride (PROSCAR) 5 MG tablet Take 5 mg by mouth daily.    . hydrALAZINE (APRESOLINE) 50 MG tablet TAKE 1 TABLET BY MOUTH EVERY 8 HOURS 270 tablet 0  . levothyroxine (SYNTHROID, LEVOTHROID) 50 MCG tablet Take 50 mcg by mouth daily.    Marland Kitchen LORazepam (ATIVAN) 1 MG tablet TAKE 1/2 TABLET OR 1 TABLET BY MOUTH AT BEDTIME AS NEEDED  2  . Multiple Vitamins-Minerals (MULTIVITAMIN PO) Take by mouth daily.    Marland Kitchen NASACORT ALLERGY 24HR 55 MCG/ACT AERO nasal inhaler USE ONE SPRAY TWICE DAILY  3  . Simethicone (PHAZYME) 180 MG CAPS Take by mouth daily.    Marland Kitchen spironolactone (ALDACTONE) 25 MG tablet  Take 0.5 tablets (12.5 mg  total) by mouth 2 (two) times daily. 30 tablet 6   No current facility-administered medications for this visit.     Socially he is married and has 3 children he quit tobacco in 1978. 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  ROS General: Negative; No fevers, chills, or night sweats;  HEENT: Positive for history of glaucoma No changes in vision or hearing, sinus congestion, difficulty swallowing Pulmonary: Negative; No cough, wheezing, shortness of breath, hemoptysis Cardiovascular: Negative; No chest pain, presyncope, syncope, palpitations Positive for intermittent leg swelling GI: Negative; No nausea, vomiting, diarrhea, or abdominal pain GU: Positive for renal cysts; No dysuria, hematuria, or difficulty voiding Musculoskeletal: Negative; no myalgias, joint pain, or weakness Hematologic/Oncology: Negative; no easy bruising, bleeding Endocrine: Negative; no heat/cold intolerance; no diabetes Neuro: Negative; no changes in balance, headaches Skin: Negative; No rashes or skin lesions Psychiatric: Negative; No behavioral problems, depression Sleep: Negative; No snoring, daytime sleepiness, hypersomnolence, bruxism, restless legs, hypnogognic hallucinations, no cataplexy Other comprehensive 14 point system review is negative.    PE BP 140/74   Pulse (!) 47   Ht '5\' 8"'$  (1.727 m)   Wt 151 lb (68.5 kg)   BMI 22.96 kg/m    Repeat blood pressure by me was 140/70  Wt Readings from Last 3 Encounters:  06/16/17 151 lb (68.5 kg)  06/09/17 150 lb 6.4 oz (68.2 kg)  05/12/17 155 lb (70.3 kg)      Physical Exam BP 140/74   Pulse (!) 47   Ht '5\' 8"'$  (1.727 m)   Wt 151 lb (68.5 kg)   BMI 22.96 kg/m  General: Alert, oriented, no distress.  Skin: normal turgor, no rashes, warm and dry HEENT: Normocephalic, atraumatic. Pupils equal round and reactive to light; sclera anicteric; extraocular muscles intact;  Nose without  nasal septal hypertrophy Mouth/Parynx benign; Mallinpatti scale 3 Neck: No JVD, no carotid bruits; normal carotid upstroke Lungs: clear to ausculatation and percussion; no wheezing or rales Chest wall: without tenderness to palpitation Heart: PMI not displaced, rhythm regular, but bradycardic, s1 s2 normal, 1/6 systolic murmur, no diastolic murmur, no rubs, gallops, thrills, or heaves Abdomen: soft, nontender; no hepatosplenomehaly, BS+; abdominal aorta nontender and not dilated by palpation. Back: no CVA tenderness Pulses 2+ Musculoskeletal: full range of motion, normal strength, no joint deformities Extremities: no clubbing cyanosis or edema, Homan's sign negative  Neurologic: grossly nonfocal; Cranial nerves grossly wnl Psychologic: Normal mood and affect   ECG (independently read by me): Sinus bradycardia at 47 bpm with first-degree AV block with a PR interval at 232 ms.  No significant ST-T changes.  May 2018 ECG (independently read by me): Sinus bradycardia at 57 bpm with first degree AV block with a PR interval of 256 ms.  No significant ST-T changes.  02/02/2017 ECG (independently read by me): Atrial flutter with variable block with ventricular rate in the 80s.  Nonspecific ST-T changes.  QTc interval 493 ms.  October 2017 ECG (independently read by me): Sinus bradycardia at 43 bpm with a meal.  First-degree block.  PAC.  QTc interval normal.  July 2017 ECG (independently read by me): Sinus rhythm but bradycardic in the 40s with sinus arrhythmia.  Nonspecific ST changes.  April 2017 ECG (independently read by me): August bradycardia at 53 bpm.  Sinus arrhythmia.  Nonspecific ST changes.  09/24/2015 ECG (independently read by me): Sinus bradycardia, first-degree AV block.  Ventricular rate 51.  PR interval 218 ms.  Nondiagnostic ST changes  May 2016 ECG (independently read by me): Sinus bradycardia 52 bpm.  PR interval 198 ms, QTc interval 451 ms.  Nonspecific interventricular  conduction delay.  February 2016 ECG (independently read by me, and (: Sinus bradycardia 48 bpm.  Nonspecific conduction delay.  No significant ST abnormalities.  QTc interval 434 ms.  November 2015 ECG (independently read by me):  Sinus bradycardia 52 bpm.  PR interval 216 ms.  No ectopy.  July 2015ECG (independently read by me): Sinus bradycardia with first degree AV block.  PR interval 240 ms.  Nonspecific ST changes.  PAC.  Prior 01/10/2014 ECG (independently read by me) sinus bradycardia 53 beats per minute. QTc interval 457 ms. No ectopy.  Prior 12/12/2013 ECG  (independently read by me): Sinus rhythm at 56 beats per minute. PR interval 206 ms. QTc interval 470 ms   Prior ECG of 10/31/2013: Normal sinus rhythm at 61 beats per minute. Isolated  PVC.  Non-specific intra-ventricular conduction delay. Nonspecific ST-T changes. QTc interval 461 ms.  LABS:  BMP Latest Ref Rng & Units 03/09/2017 09/09/2016 09/08/2016  Glucose 65 - 99 mg/dL 88 95 -  BUN 8 - 27 mg/dL 20 21(H) -  Creatinine 0.76 - 1.27 mg/dL 1.33(H) 1.33(H) 1.54(H)  BUN/Creat Ratio 10 - 24 15 - -  Sodium 134 - 144 mmol/L 141 138 -  Potassium 3.5 - 5.2 mmol/L 4.4 4.3 -  Chloride 96 - 106 mmol/L 106 111 -  CO2 18 - 29 mmol/L 20 23 -  Calcium 8.6 - 10.2 mg/dL 8.6 8.4(L) -    Hepatic Function Latest Ref Rng & Units 03/09/2017 09/07/2016 06/05/2016  Total Protein 6.0 - 8.5 g/dL 5.8(L) 6.2(L) 5.9(L)  Albumin 3.5 - 4.7 g/dL 3.8 3.7 3.9  AST 0 - 40 IU/L '24 27 25  '$ ALT 0 - 44 IU/L '21 19 16  '$ Alk Phosphatase 39 - 117 IU/L 61 54 58  Total Bilirubin 0.0 - 1.2 mg/dL 1.3(H) 1.2 1.1    CBC Latest Ref Rng & Units 03/09/2017 09/07/2016 06/05/2016  WBC 3.4 - 10.8 x10E3/uL 5.2 5.1 3.9  Hemoglobin 13.0 - 17.7 g/dL 11.9(L) 11.6(L) 12.2(L)  Hematocrit 37.5 - 51.0 % 35.7(L) 34.3(L) 37.1(L)  Platelets 150 - 379 x10E3/uL 165 159 167   Lab Results  Component Value Date   MCV 97 03/09/2017   MCV 98.0 09/07/2016   MCV 98 (H) 06/05/2016   Lab  Results  Component Value Date   TSH 5.790 (H) 03/09/2017   Lab Results  Component Value Date   HGBA1C 5.6 09/07/2016     Lipid Panel     Component Value Date/Time   CHOL 122 03/09/2017 0831   TRIG 75 03/09/2017 0831   HDL 64 03/09/2017 0831   CHOLHDL 3.1 09/08/2016 0507   VLDL 18 09/08/2016 0507   LDLCALC 43 03/09/2017 0831      RADIOLOGY: No results found.  IMPRESSION:  1. PAF (paroxysmal atrial fibrillation) ([place])   2. Typical atrial flutter ([place])   3. Essential hypertension   4. Sick sinus syndrome ([place])   5. Hypothyroidism, unspecified type   6. Anticoagulated     ASSESSMENT AND PLAN: Mr. Hornbaker is a Young appearing 83 year old white male who has a history of PAF and had been maintaining normal sinus rhythm on amiodarone.  Remotely, he had been on amiodarone and his dose had been tapered down to 100 alternating with 200 every other day due to bradycardia.  In November 2017 he developed transient right  hemianopsia for 5-6 hours and on MRI imaging and was found to have small right cortical and subcortical left parieto-occipital infarct with petechial hemorrhage.  His eliquis was held and he was treated with full dose aspirin for 1 week and ultimately eliquis was resumed by the neurologist at 5 mg twice a day.  In January 2018 , he notices pulse rate had increased.  I reviewed his ECG from January 22 and by my review, it appears that the patient was in atrial flutter at that time.  When I saw him on 02/02/2017, his ECG confirmed atrial flutter with variable block.  I further titrated amiodarone to 400 mg a day for 1 week and since then he has been on 300 mg daily.  When I last saw him, he was maintaining sinus rhythm with ventricular rate at 57.  LFTs were normal.  TSH was minimally increased.  Over the last several months, he has continued to be stable.  He brought his records with him.  His blood pressure seems to be well controlled and he states his mean heart rate has been  proximally 56 bpm.  His ECG today shows sinus bradycardia at 47 bpm for which she is asymptomatic.  I have removed recommended a very slight reduction of his amiodarone, such that he will take 200 mg on even days, but continue to take 300 mg on odd days.  He is asymptomatic with reference to presyncope or syncope.  He's not having any chest pain.  He recently had noted dark stool but heme was negative.  He continues to be on eliquis anticoagulation.  Follow-up laboratory will be checked with Dr. Woody Seller.  I will see him in 6 months for reevaluation.    Time spent: 25 minutes  Troy Sine, MD, [facility]  06/18/2017 10:39 PM

## 2017-06-17 DIAGNOSIS — M9902 Segmental and somatic dysfunction of thoracic region: Secondary | ICD-10-CM | POA: Diagnosis not present

## 2017-06-17 DIAGNOSIS — M546 Pain in thoracic spine: Secondary | ICD-10-CM | POA: Diagnosis not present

## 2017-06-17 DIAGNOSIS — M9901 Segmental and somatic dysfunction of cervical region: Secondary | ICD-10-CM | POA: Diagnosis not present

## 2017-06-17 DIAGNOSIS — M9903 Segmental and somatic dysfunction of lumbar region: Secondary | ICD-10-CM | POA: Diagnosis not present

## 2017-06-17 DIAGNOSIS — M47812 Spondylosis without myelopathy or radiculopathy, cervical region: Secondary | ICD-10-CM | POA: Diagnosis not present

## 2017-06-17 DIAGNOSIS — M47816 Spondylosis without myelopathy or radiculopathy, lumbar region: Secondary | ICD-10-CM | POA: Diagnosis not present

## 2017-06-29 DIAGNOSIS — D649 Anemia, unspecified: Secondary | ICD-10-CM | POA: Diagnosis not present

## 2017-06-30 ENCOUNTER — Other Ambulatory Visit: Payer: Self-pay | Admitting: Cardiovascular Disease

## 2017-07-08 DIAGNOSIS — M9902 Segmental and somatic dysfunction of thoracic region: Secondary | ICD-10-CM | POA: Diagnosis not present

## 2017-07-08 DIAGNOSIS — M47816 Spondylosis without myelopathy or radiculopathy, lumbar region: Secondary | ICD-10-CM | POA: Diagnosis not present

## 2017-07-08 DIAGNOSIS — M9903 Segmental and somatic dysfunction of lumbar region: Secondary | ICD-10-CM | POA: Diagnosis not present

## 2017-07-08 DIAGNOSIS — M546 Pain in thoracic spine: Secondary | ICD-10-CM | POA: Diagnosis not present

## 2017-07-08 DIAGNOSIS — M9901 Segmental and somatic dysfunction of cervical region: Secondary | ICD-10-CM | POA: Diagnosis not present

## 2017-07-08 DIAGNOSIS — M47812 Spondylosis without myelopathy or radiculopathy, cervical region: Secondary | ICD-10-CM | POA: Diagnosis not present

## 2017-07-10 ENCOUNTER — Telehealth: Payer: Self-pay | Admitting: Cardiovascular Disease

## 2017-07-10 NOTE — Telephone Encounter (Signed)
New message       Patient calling the office for samples of medication:   1.  What medication and dosage are you requesting samples for?  eliquis 5mg   2.  Are you currently out of this medication?  Almost out of medication

## 2017-07-10 NOTE — Telephone Encounter (Signed)
Pt notified-no samples available hel will cb next week, informed pt that he cannot stop this medication and he states that he still has a few weeks left and will call back to check inventory

## 2017-07-13 DIAGNOSIS — Z299 Encounter for prophylactic measures, unspecified: Secondary | ICD-10-CM | POA: Diagnosis not present

## 2017-07-13 DIAGNOSIS — S50812A Abrasion of left forearm, initial encounter: Secondary | ICD-10-CM | POA: Diagnosis not present

## 2017-07-13 DIAGNOSIS — Z713 Dietary counseling and surveillance: Secondary | ICD-10-CM | POA: Diagnosis not present

## 2017-07-13 DIAGNOSIS — T148XXA Other injury of unspecified body region, initial encounter: Secondary | ICD-10-CM | POA: Diagnosis not present

## 2017-07-13 DIAGNOSIS — Z6824 Body mass index (BMI) 24.0-24.9, adult: Secondary | ICD-10-CM | POA: Diagnosis not present

## 2017-07-20 DIAGNOSIS — H401121 Primary open-angle glaucoma, left eye, mild stage: Secondary | ICD-10-CM | POA: Diagnosis not present

## 2017-07-20 DIAGNOSIS — H04123 Dry eye syndrome of bilateral lacrimal glands: Secondary | ICD-10-CM | POA: Diagnosis not present

## 2017-07-20 DIAGNOSIS — H40021 Open angle with borderline findings, high risk, right eye: Secondary | ICD-10-CM | POA: Diagnosis not present

## 2017-07-22 ENCOUNTER — Telehealth: Payer: Self-pay | Admitting: Cardiovascular Disease

## 2017-07-22 NOTE — Telephone Encounter (Signed)
New message   Pt is calling because he called last week to see if we have eliquis. He is calling again.   Patient calling the office for samples of medication:   1.  What medication and dosage are you requesting samples for? ELIQUIS 5 MG TABS tablet  2.  Are you currently out of this medication? Not out yet but in doughnut hole

## 2017-07-22 NOTE — Telephone Encounter (Signed)
Please provide Eliquis samples today and initiate Eliquis patient assistant paperwork \

## 2017-07-22 NOTE — Telephone Encounter (Signed)
Patient called and made aware that samples are available at the front for him. He verbalized his understanding.  Medication Samples have been provided to the patient.  Drug name: Eliquis Strength: 5 mg         Qty: 3 boxes     LOT: [id number]   Exp.Date: 12/20

## 2017-07-30 ENCOUNTER — Other Ambulatory Visit: Payer: Self-pay | Admitting: Cardiovascular Disease

## 2017-07-30 DIAGNOSIS — E785 Hyperlipidemia, unspecified: Secondary | ICD-10-CM

## 2017-08-05 DIAGNOSIS — M9902 Segmental and somatic dysfunction of thoracic region: Secondary | ICD-10-CM | POA: Diagnosis not present

## 2017-08-05 DIAGNOSIS — M9903 Segmental and somatic dysfunction of lumbar region: Secondary | ICD-10-CM | POA: Diagnosis not present

## 2017-08-05 DIAGNOSIS — M546 Pain in thoracic spine: Secondary | ICD-10-CM | POA: Diagnosis not present

## 2017-08-05 DIAGNOSIS — M9901 Segmental and somatic dysfunction of cervical region: Secondary | ICD-10-CM | POA: Diagnosis not present

## 2017-08-05 DIAGNOSIS — M47812 Spondylosis without myelopathy or radiculopathy, cervical region: Secondary | ICD-10-CM | POA: Diagnosis not present

## 2017-08-05 DIAGNOSIS — M47816 Spondylosis without myelopathy or radiculopathy, lumbar region: Secondary | ICD-10-CM | POA: Diagnosis not present

## 2017-08-07 ENCOUNTER — Other Ambulatory Visit: Payer: Self-pay | Admitting: Cardiovascular Disease

## 2017-08-07 NOTE — Telephone Encounter (Signed)
Rx(s) sent to pharmacy electronically.  

## 2017-08-26 DIAGNOSIS — Z87891 Personal history of nicotine dependence: Secondary | ICD-10-CM | POA: Diagnosis not present

## 2017-08-26 DIAGNOSIS — Z6824 Body mass index (BMI) 24.0-24.9, adult: Secondary | ICD-10-CM | POA: Diagnosis not present

## 2017-08-26 DIAGNOSIS — I480 Paroxysmal atrial fibrillation: Secondary | ICD-10-CM | POA: Diagnosis not present

## 2017-08-26 DIAGNOSIS — I10 Essential (primary) hypertension: Secondary | ICD-10-CM | POA: Diagnosis not present

## 2017-08-26 DIAGNOSIS — J320 Chronic maxillary sinusitis: Secondary | ICD-10-CM | POA: Diagnosis not present

## 2017-08-26 DIAGNOSIS — I471 Supraventricular tachycardia: Secondary | ICD-10-CM | POA: Diagnosis not present

## 2017-08-26 DIAGNOSIS — Z299 Encounter for prophylactic measures, unspecified: Secondary | ICD-10-CM | POA: Diagnosis not present

## 2017-08-26 DIAGNOSIS — E039 Hypothyroidism, unspecified: Secondary | ICD-10-CM | POA: Diagnosis not present

## 2017-09-10 ENCOUNTER — Telehealth: Payer: Self-pay | Admitting: Cardiovascular Disease

## 2017-09-10 NOTE — Telephone Encounter (Signed)
Medication samples have been provided to the patient.  Drug name: Eliquis 5mg   Qty: 42 tablets  LOT: [id number]  Exp.Date: 3/21  Spoke to wife and she is aware and verbalized understanding.

## 2017-09-10 NOTE — Telephone Encounter (Signed)
New message      Patient calling the office for samples of medication:   1.  What medication and dosage are you requesting samples for?ELIQUIS 5 MG TABS tablet  2.  Are you currently out of this medication? No, 1 week remaining

## 2017-10-05 ENCOUNTER — Telehealth: Payer: Self-pay | Admitting: Cardiovascular Disease

## 2017-10-05 NOTE — Telephone Encounter (Signed)
Patient calling the office for samples of medication:   1.  What medication and dosage are you requesting samples for? Eliquis  2.  Are you currently out of this medication? Would like 4 boxes if possible please

## 2017-10-05 NOTE — Telephone Encounter (Signed)
Pt made aware no samples currently available, recommended he call back later in the week.  He voiced thanks and understanding.

## 2017-10-09 ENCOUNTER — Telehealth: Payer: Self-pay | Admitting: Cardiovascular Disease

## 2017-10-09 NOTE — Telephone Encounter (Signed)
°  Patient calling the office for samples of medication:   1.  What medication and dosage are you requesting samples for?Eliquis-3 boxes if possible please 3.Are you currently out of this medication? yes

## 2017-10-09 NOTE — Telephone Encounter (Signed)
Left message for patient, no samples available at this time. 

## 2017-10-19 ENCOUNTER — Other Ambulatory Visit: Payer: Self-pay | Admitting: Cardiovascular Disease

## 2017-10-22 DIAGNOSIS — L821 Other seborrheic keratosis: Secondary | ICD-10-CM | POA: Diagnosis not present

## 2017-10-22 DIAGNOSIS — Z85828 Personal history of other malignant neoplasm of skin: Secondary | ICD-10-CM | POA: Diagnosis not present

## 2017-10-22 DIAGNOSIS — D485 Neoplasm of uncertain behavior of skin: Secondary | ICD-10-CM | POA: Diagnosis not present

## 2017-10-22 DIAGNOSIS — D1801 Hemangioma of skin and subcutaneous tissue: Secondary | ICD-10-CM | POA: Diagnosis not present

## 2017-10-22 DIAGNOSIS — D0422 Carcinoma in situ of skin of left ear and external auricular canal: Secondary | ICD-10-CM | POA: Diagnosis not present

## 2017-10-22 DIAGNOSIS — L570 Actinic keratosis: Secondary | ICD-10-CM | POA: Diagnosis not present

## 2017-10-23 DIAGNOSIS — Z23 Encounter for immunization: Secondary | ICD-10-CM | POA: Diagnosis not present

## 2017-10-30 DIAGNOSIS — Z0000 Encounter for general adult medical examination without abnormal findings: Secondary | ICD-10-CM | POA: Diagnosis not present

## 2017-10-30 DIAGNOSIS — R5383 Other fatigue: Secondary | ICD-10-CM | POA: Diagnosis not present

## 2017-10-30 DIAGNOSIS — Z7189 Other specified counseling: Secondary | ICD-10-CM | POA: Diagnosis not present

## 2017-10-30 DIAGNOSIS — R279 Unspecified lack of coordination: Secondary | ICD-10-CM | POA: Diagnosis not present

## 2017-10-30 DIAGNOSIS — Z79899 Other long term (current) drug therapy: Secondary | ICD-10-CM | POA: Diagnosis not present

## 2017-10-30 DIAGNOSIS — Z1339 Encounter for screening examination for other mental health and behavioral disorders: Secondary | ICD-10-CM | POA: Diagnosis not present

## 2017-10-30 DIAGNOSIS — I10 Essential (primary) hypertension: Secondary | ICD-10-CM | POA: Diagnosis not present

## 2017-10-30 DIAGNOSIS — Z1211 Encounter for screening for malignant neoplasm of colon: Secondary | ICD-10-CM | POA: Diagnosis not present

## 2017-10-30 DIAGNOSIS — Z1331 Encounter for screening for depression: Secondary | ICD-10-CM | POA: Diagnosis not present

## 2017-10-30 DIAGNOSIS — Z299 Encounter for prophylactic measures, unspecified: Secondary | ICD-10-CM | POA: Diagnosis not present

## 2017-10-30 DIAGNOSIS — Z125 Encounter for screening for malignant neoplasm of prostate: Secondary | ICD-10-CM | POA: Diagnosis not present

## 2017-10-30 DIAGNOSIS — E039 Hypothyroidism, unspecified: Secondary | ICD-10-CM | POA: Diagnosis not present

## 2017-10-30 DIAGNOSIS — C4490 Unspecified malignant neoplasm of skin, unspecified: Secondary | ICD-10-CM | POA: Diagnosis not present

## 2017-11-09 DIAGNOSIS — H40021 Open angle with borderline findings, high risk, right eye: Secondary | ICD-10-CM | POA: Diagnosis not present

## 2017-11-09 DIAGNOSIS — H04123 Dry eye syndrome of bilateral lacrimal glands: Secondary | ICD-10-CM | POA: Diagnosis not present

## 2017-11-09 DIAGNOSIS — H401121 Primary open-angle glaucoma, left eye, mild stage: Secondary | ICD-10-CM | POA: Diagnosis not present

## 2017-11-09 DIAGNOSIS — Z961 Presence of intraocular lens: Secondary | ICD-10-CM | POA: Diagnosis not present

## 2017-11-23 ENCOUNTER — Encounter: Payer: Self-pay | Admitting: Neurology

## 2017-11-23 ENCOUNTER — Ambulatory Visit (INDEPENDENT_AMBULATORY_CARE_PROVIDER_SITE_OTHER): Payer: Medicare Other | Admitting: Neurology

## 2017-11-23 VITALS — BP 153/75 | HR 53 | Ht 68.0 in | Wt 155.2 lb

## 2017-11-23 DIAGNOSIS — I63412 Cerebral infarction due to embolism of left middle cerebral artery: Secondary | ICD-10-CM | POA: Diagnosis not present

## 2017-11-23 DIAGNOSIS — I480 Paroxysmal atrial fibrillation: Secondary | ICD-10-CM | POA: Diagnosis not present

## 2017-11-23 DIAGNOSIS — Z7901 Long term (current) use of anticoagulants: Secondary | ICD-10-CM | POA: Diagnosis not present

## 2017-11-23 DIAGNOSIS — I10 Essential (primary) hypertension: Secondary | ICD-10-CM

## 2017-11-23 NOTE — Progress Notes (Addendum)
STROKE NEUROLOGY FOLLOW UP NOTE  NAME: Dylan York DOB: 04/14/1926  REASON FOR VISIT: stroke follow up HISTORY FROM: pt and chart  Today we had the pleasure of seeing Dylan York in follow-up at our Neurology Clinic. Pt was accompanied by wife.   History Summary Dylan York is a 84 y.o. male with history of AF on eliquis 2.5mg  bid, sinus bradycardia, HTN, hypothyroidism and glaucoma admitted on 09/07/16 for transient right hemianopia for 5-6 hours. MRI showed small left cortical and subcortical parieto-occipital infarcts with petechial hemorrhagic transformation. MRA, CUS and TTE unremarkable. LEL 89 and A1C 5.6. His Cre 1.33, and his eliquis was underdosed. Due to petechial hemorrhage, he was put on ASA for 7 days before starting eliquis 5mg  bid. He was also discharged with lipitor 20mg .   10/02/16 follow up - the patient has been doing well. He is on eliquis 5mg  bid and lipitor without side effect. Followed with PCP and recent renal function check was told normal. BP today 117/61, well controlled at home. No complains. Currently is ready to be discharged from PT/OT.   01/06/17 follow up - the pt has been doing well except had a mechanical fall on 12/21/16. He hit her left forehead, both wrist and left flank. Went to [facility], had head CT negative. He was put on b/l wrist splint which were taken off yesterday after following with PCP. He still complain of intermittent right finger tingling and burning sensation especially at night. BP today 120/77. On eliquis 5mg  bid, no side effect.   05/12/17 follow up - pt has been doing well. Recent Cre 1.33 in 03/2017. He continued on regular dose of eliquis. Still complains of b/l wrist pain especially at night during sleep, fingertip numbness tingling. Saw chiropractor and doing laser rehab at b/l wrists. Complains some staggering on walking but no tendency to fall. BP today 111/65.   Interval History During the interval time, pt has  been doing well.  No recurrent stroke like symptoms.  Had EMG/NCS in July, confirmed bilateral carpal tunnel syndrome, right severe than left.  Patient started to have wrist splint at night, and currently all symptoms resolved.  He still complains of mild fingertip numbness and right hand, however, not bothering him much.  He had a couple of mechanical falls since last visit, one including go to mailbox during heavy snow.  He was educated on fall precautions.  His blood pressure at home around 120/65-70 and heart rate 55-60.  Today in clinic BP 153/75, and he admitted that he was anxious in the clinic every time.  Still on Eliquis and Lipitor without side effects.  REVIEW OF SYSTEMS: Full 14 system review of systems performed and notable only for those listed below and in HPI above, all others are negative:  Constitutional:   Cardiovascular:  Ear/Nose/Throat:   Skin:  Eyes:   Respiratory:  Gastroitestinal:  Swollen abdomen Genitourinary: frequency of urination  Hematology/Lymphatic:  Bruise / bleed easily  Endocrine: cold intolerance  Musculoskeletal:  Allergy/Immunology:   Neurological:   Psychiatric:  Sleep: Daytime sleepiness  The following represents the patient's updated allergies and side effects list: Allergies  Allergen Reactions  . Multaq [Dronedarone] Shortness Of Breath    The neurologically relevant items on the patient's problem list were reviewed on today's visit.  Neurologic Examination  A problem focused neurological exam (12 or more points of the single system neurologic examination, vital signs counts as 1 point, cranial nerves count for 8  points) was performed.  Blood pressure (!) 153/75, pulse (!) 53, height 5\' 8"  (1.727 m), weight 155 lb 3.2 oz (70.4 kg).  General - Well nourished, well developed, in no apparent distress.  Ophthalmologic - Sharp disc margins OU.   Cardiovascular - Regular rate and rhythm.  Mental Status -  Level of arousal and  orientation to time, place, and person were intact. Language including expression, naming, repetition, comprehension was assessed and found intact. Attention span and concentration were normal. Fund of Knowledge was assessed and was intact.  Cranial Nerves II - XII - II - Visual field intact OU. III, IV, VI - Extraocular movements intact. V - Facial sensation intact bilaterally. VII - Facial movement intact bilaterally. VIII - Hearing & vestibular intact bilaterally. X - Palate elevates symmetrically. XI - Chin turning & shoulder shrug intact bilaterally. XII - Tongue protrusion intact.  Motor Strength - The patient's strength was normal in all extremities and pronator drift was absent.  Bulk was normal and fasciculations were absent.   Motor Tone - Muscle tone was assessed at the neck and appendages and was normal.  Reflexes - The patient's reflexes were 1+ in all extremities and he had no pathological reflexes.  Sensory - Light touch, temperature/pinprick were assessed and were symmetrical.    Coordination - The patient had normal movements in the hands with no ataxia or dysmetria.  Tremor was absent.  Gait and Station - The patient's transfers, posture, gait, station, and turns were observed as normal.   Data reviewed: I personally reviewed the images and agree with the radiology interpretations.  Ct Head Wo Contrast 09/07/2016 1.  No evidence of acute intracranial abnormality. 2. Involutional change and moderate chronic small vessel ischemia.   MRI HEAD  09/07/2016 1. Small volume acute cortical and subcortical left parieto-occipital ischemic infarcts as above. Associated petechial hemorrhage without frank hemorrhagic transformation. No associated mass effect. 2. Moderate chronic microvascular ischemic disease.   MRA HEAD  09/07/2016 Normal intracranial MRA for patient age. No large or proximal arterial branch occlusion. No high-grade or correctable stenosis.    Carotid Doppler   There is 1-39% bilateral ICA stenosis. Vertebral artery flow is antegrade.   2D Echocardiogram  - Left ventricle: The cavity size was normal. There was mild focalbasal hypertrophy of the septum. Systolic function was normal.The estimated ejection fraction was in the range of 55% to 60%.Wall motion was normal; there were no regional wall motionabnormalities. Doppler parameters are consistent with abnormalleft ventricular relaxation (grade 1 diastolic dysfunction). - Aortic valve: Trileaflet; mildly thickened, mildly calcifiedleaflets. There was mild regurgitation. - Mitral valve: Calcified annulus. - Left atrium: The atrium was moderately dilated. Volume/bsa, S:40.4 ml/m^2. - Right ventricle: The cavity size was mildly dilated. Wallthickness was normal. Systolic function was mildly reduced. - Right atrium: The atrium was moderately dilated. - Tricuspid valve: There was moderate regurgitation. - Pulmonary arteries: Systolic pressure was moderately increased.PA peak pressure: 55 mm Hg (S). Impressions:  Compared to the prior study, there has been no significantinterval change.  EMG/NCS 05/2017 Abnormal study demonstrating: - Bilateral median neuropathies at the wrist consistent with bilateral carpal tunnel syndrome. Given the clinical context this may be posttraumatic. Electrodiagnostically this is more severe on the right than the left side.  Component     Latest Ref Rng & Units 09/07/2016 09/08/2016  Cholesterol     0 - 200 mg/dL  159  Triglycerides     <150 mg/dL  92  HDL Cholesterol     >  40 mg/dL  52  Total CHOL/HDL Ratio     RATIO  3.1  VLDL     0 - 40 mg/dL  18  LDL (calc)     0 - 99 mg/dL  89  Hemoglobin A1C     4.8 - 5.6 % 5.6   Mean Plasma Glucose     mg/dL 114   TSH     0.350 - 4.500 uIU/mL 2.173     Assessment: As you may recall, he is a 83 y.o. Caucasian male with PMH of history of AF on eliquis 2.5mg  bid, sinus bradycardia, HTN,  hypothyroidism and glaucoma admitted on 09/07/16 for transient right hemianopia for 5-6 hours. MRI showed small right cortical and subcortical parieto-occipital infarcts with petechial hemorrhagic transformation. MRA, CUS and TTE unremarkable. LDL 89 and A1C 5.6. His Cre 1.33, and his eliquis was underdosed. Due to petechial hemorrhage, he was put on ASA for 7 days before starting eliquis 5mg  bid. He was also discharged with lipitor 20mg . Followed with PCP for renal function monitoring. During the interval time, he is doing well. Has bilateral wrist pain and finger tingling burning, had EMG/NMCS confirmed bilateral carpal tunnel syndrome, right severe than left.  Treated with bilateral wrist splint and symptom resolved. BP stable at home. Renal function stable.  Plan:  - continue eliquis 5mg  and lipitor for stroke prevention. Follow up with PCP for renal function monitoring. - Follow up with your primary care physician for stroke risk factor modification. Recommend maintain blood pressure goal <130/80, diabetes with hemoglobin A1c goal below 7.0% and lipids with LDL cholesterol goal below 70 mg/dL.  - check BP at home and record - follow up with cardiology as scheduled. - be careful with walking and avoid fall - self exercise and healthy diet.  - follow up as needed.   I spent more than 25 minutes of face to face time with the patient. Greater than 50% of time was spent in counseling and coordination of care. We discussed about renal function monitoring, compliant with medication and avoid falls.   No orders of the defined types were placed in this encounter.   No orders of the defined types were placed in this encounter.   Patient Instructions  - continue eliquis 5mg  and lipitor for stroke prevention. Follow up with PCP for renal function monitoring. - Follow up with your primary care physician for stroke risk factor modification. Recommend maintain blood pressure goal <130/80, diabetes with  hemoglobin A1c goal below 7.0% and lipids with LDL cholesterol goal below 70 mg/dL.  - check BP at home and record - follow up with cardiology as scheduled. - be careful with walking and avoid fall - self exercise and healthy diet.  - follow up as needed.    Rosalin Hawking, MD PhD [facility] [address], [place], [place] [address] [phone]

## 2017-11-23 NOTE — Patient Instructions (Addendum)
-   continue eliquis 5mg  and lipitor for stroke prevention. Follow up with PCP for renal function monitoring. - Follow up with your primary care physician for stroke risk factor modification. Recommend maintain blood pressure goal <130/80, diabetes with hemoglobin A1c goal below 7.0% and lipids with LDL cholesterol goal below 70 mg/dL.  - check BP at home and record - follow up with cardiology as scheduled. - be careful with walking and avoid fall - self exercise and healthy diet.  - follow up as needed.

## 2017-12-18 DIAGNOSIS — Z6824 Body mass index (BMI) 24.0-24.9, adult: Secondary | ICD-10-CM | POA: Diagnosis not present

## 2017-12-18 DIAGNOSIS — Z299 Encounter for prophylactic measures, unspecified: Secondary | ICD-10-CM | POA: Diagnosis not present

## 2017-12-18 DIAGNOSIS — Z789 Other specified health status: Secondary | ICD-10-CM | POA: Diagnosis not present

## 2017-12-18 DIAGNOSIS — H00019 Hordeolum externum unspecified eye, unspecified eyelid: Secondary | ICD-10-CM | POA: Diagnosis not present

## 2017-12-18 DIAGNOSIS — I10 Essential (primary) hypertension: Secondary | ICD-10-CM | POA: Diagnosis not present

## 2018-01-28 DIAGNOSIS — K7689 Other specified diseases of liver: Secondary | ICD-10-CM | POA: Diagnosis not present

## 2018-01-28 DIAGNOSIS — I10 Essential (primary) hypertension: Secondary | ICD-10-CM | POA: Diagnosis not present

## 2018-01-28 DIAGNOSIS — Z299 Encounter for prophylactic measures, unspecified: Secondary | ICD-10-CM | POA: Diagnosis not present

## 2018-01-28 DIAGNOSIS — Z6824 Body mass index (BMI) 24.0-24.9, adult: Secondary | ICD-10-CM | POA: Diagnosis not present

## 2018-01-28 DIAGNOSIS — E039 Hypothyroidism, unspecified: Secondary | ICD-10-CM | POA: Diagnosis not present

## 2018-01-28 DIAGNOSIS — G4700 Insomnia, unspecified: Secondary | ICD-10-CM | POA: Diagnosis not present

## 2018-01-28 DIAGNOSIS — I480 Paroxysmal atrial fibrillation: Secondary | ICD-10-CM | POA: Diagnosis not present

## 2018-02-01 DIAGNOSIS — D485 Neoplasm of uncertain behavior of skin: Secondary | ICD-10-CM | POA: Diagnosis not present

## 2018-02-01 DIAGNOSIS — L821 Other seborrheic keratosis: Secondary | ICD-10-CM | POA: Diagnosis not present

## 2018-02-01 DIAGNOSIS — L570 Actinic keratosis: Secondary | ICD-10-CM | POA: Diagnosis not present

## 2018-02-01 DIAGNOSIS — D0439 Carcinoma in situ of skin of other parts of face: Secondary | ICD-10-CM | POA: Diagnosis not present

## 2018-02-01 DIAGNOSIS — D044 Carcinoma in situ of skin of scalp and neck: Secondary | ICD-10-CM | POA: Diagnosis not present

## 2018-02-01 DIAGNOSIS — Z85828 Personal history of other malignant neoplasm of skin: Secondary | ICD-10-CM | POA: Diagnosis not present

## 2018-02-24 ENCOUNTER — Other Ambulatory Visit: Payer: Self-pay | Admitting: Cardiovascular Disease

## 2018-02-24 NOTE — Telephone Encounter (Signed)
Rx sent to pharmacy   

## 2018-03-26 ENCOUNTER — Telehealth: Payer: Self-pay | Admitting: Cardiovascular Disease

## 2018-03-26 ENCOUNTER — Other Ambulatory Visit: Payer: Self-pay | Admitting: Cardiovascular Disease

## 2018-03-26 NOTE — Telephone Encounter (Signed)
Rx request sent to pharmacy.  

## 2018-03-26 NOTE — Telephone Encounter (Signed)
Called pt and left message that samples are available for pick up.  eliquis 5 mg Qty: 2 boxes Lot# [id number] exp 6/21

## 2018-03-26 NOTE — Telephone Encounter (Signed)
Patient calling the office for samples of medication:   1.  What medication and dosage are you requesting samples for? Eliqius 5 mg  2.  Are you currently out of this medication? No

## 2018-03-30 ENCOUNTER — Telehealth: Payer: Self-pay | Admitting: Cardiovascular Disease

## 2018-03-30 NOTE — Telephone Encounter (Signed)
New Message   Pt states he can not come pick up the samples of Eliquis until Thursday 5/30

## 2018-03-30 NOTE — Telephone Encounter (Signed)
Patient called and stated that he will come get his samples of Eliquis tomorrow.

## 2018-04-01 DIAGNOSIS — H40021 Open angle with borderline findings, high risk, right eye: Secondary | ICD-10-CM | POA: Diagnosis not present

## 2018-04-01 DIAGNOSIS — H401121 Primary open-angle glaucoma, left eye, mild stage: Secondary | ICD-10-CM | POA: Diagnosis not present

## 2018-04-07 ENCOUNTER — Other Ambulatory Visit: Payer: Self-pay | Admitting: Cardiovascular Disease

## 2018-04-07 NOTE — Telephone Encounter (Signed)
Rx sent to pharmacy   

## 2018-04-13 ENCOUNTER — Other Ambulatory Visit (INDEPENDENT_AMBULATORY_CARE_PROVIDER_SITE_OTHER): Payer: Self-pay | Admitting: *Deleted

## 2018-04-13 ENCOUNTER — Encounter (INDEPENDENT_AMBULATORY_CARE_PROVIDER_SITE_OTHER): Payer: Self-pay | Admitting: Internal Medicine

## 2018-04-13 ENCOUNTER — Ambulatory Visit (INDEPENDENT_AMBULATORY_CARE_PROVIDER_SITE_OTHER): Payer: Medicare Other | Admitting: Internal Medicine

## 2018-04-13 VITALS — BP 122/64 | HR 60 | Temp 97.5°F | Resp 18 | Ht 68.0 in | Wt 147.0 lb

## 2018-04-13 DIAGNOSIS — D649 Anemia, unspecified: Secondary | ICD-10-CM | POA: Diagnosis not present

## 2018-04-13 DIAGNOSIS — K5900 Constipation, unspecified: Secondary | ICD-10-CM

## 2018-04-13 DIAGNOSIS — M6281 Muscle weakness (generalized): Secondary | ICD-10-CM

## 2018-04-13 DIAGNOSIS — R1031 Right lower quadrant pain: Secondary | ICD-10-CM

## 2018-04-13 DIAGNOSIS — I63412 Cerebral infarction due to embolism of left middle cerebral artery: Secondary | ICD-10-CM | POA: Diagnosis not present

## 2018-04-13 DIAGNOSIS — R1032 Left lower quadrant pain: Secondary | ICD-10-CM

## 2018-04-13 DIAGNOSIS — K4090 Unilateral inguinal hernia, without obstruction or gangrene, not specified as recurrent: Secondary | ICD-10-CM | POA: Diagnosis not present

## 2018-04-13 DIAGNOSIS — R634 Abnormal weight loss: Secondary | ICD-10-CM | POA: Diagnosis not present

## 2018-04-13 DIAGNOSIS — R143 Flatulence: Secondary | ICD-10-CM | POA: Diagnosis not present

## 2018-04-13 MED ORDER — DOCUSATE SODIUM 100 MG PO CAPS: 200.0000 mg | ORAL_CAPSULE | Freq: Every day | ORAL | 0 refills | Status: AC

## 2018-04-13 NOTE — Patient Instructions (Signed)
Gradually increase physical activity as tolerated. Consider going back to [facility] for regular exercise. When you see Dr. Georgina Peer next time ask if hydralazine dose could be reduced.

## 2018-04-13 NOTE — Progress Notes (Signed)
Presenting complaint;  Multiple complaints.  Database and subjective:  Patient is 84 year old Caucasian male who has history of small intestinal bacterial overgrowth as well as constipation who is here for scheduled visit accompanied by his wife Alice. Patient was last seen on 06/09/2017. Patient has multiple complaints and his wife is concerned that something bad is going on. He does not have a good appetite.  He is losing weight gradually.  He has lost 3 pounds since his last visit.  He states his usual weight he was to be around 162 pounds.  He does not have nausea or vomiting.  He continues to complain of bloating.  He is having intermittent pain across his lower abdomen.  He had an episode 2 days ago pain was sharp and lasted for couple hours.  He did not experience fever chills melena or rectal bleeding diarrhea hematuria or dysuria.  He reports that the pain got worse when he coughed.  He also has noted lump in his right groin. He says his daughter who lives out of state has arranged for food delivery every week for a company called [employer]. His wife reminds me that his mother died of stomach cancer when she was in her 55s. Patient also complains of weakness.  He says if he squats he is not able to stand up without using his hands.  He used to go to [facility] to exercise but he has not done that this year.  He feels weak and tired all the time.  He states he had his thyroid checked recently and he is on appropriate dose.   Current Medications: Outpatient Encounter Medications as of 04/13/2018  Medication Sig  . amiodarone (PACERONE) 200 MG tablet Take 200mg  (1 tablet) on even days and 300 mg on odd days ( 1.5 tablet)  . apixaban (ELIQUIS) 5 MG TABS tablet Take 1 tablet (5 mg total) by mouth 2 (two) times daily. Keep OV  . atorvastatin (LIPITOR) 20 MG tablet TAKE 1 TABLET BY MOUTH DAILY AT 6 PM.  . COMBIGAN 0.2-0.5 % ophthalmic solution Place 1 drop into both eyes 2 (two) times daily.  Marland Kitchen  doxazosin (CARDURA) 4 MG tablet Take 1 tablet (4 mg total) by mouth daily.  . finasteride (PROSCAR) 5 MG tablet Take 5 mg by mouth daily.  . hydrALAZINE (APRESOLINE) 50 MG tablet TAKE ONE TABLET BY MOUTH EVERY 8 HOURS  . levothyroxine (SYNTHROID, LEVOTHROID) 50 MCG tablet Take 50 mcg by mouth daily.  Marland Kitchen LORazepam (ATIVAN) 1 MG tablet TAKE 1/2 TABLET OR 1 TABLET BY MOUTH AT BEDTIME AS NEEDED  . Multiple Vitamins-Minerals (MULTIVITAMIN PO) Take by mouth daily.  Marland Kitchen NASACORT ALLERGY 24HR 55 MCG/ACT AERO nasal inhaler USE ONE SPRAY TWICE DAILY  . Simethicone (PHAZYME) 180 MG CAPS Take by mouth daily.  Marland Kitchen spironolactone (ALDACTONE) 25 MG tablet Take 0.5 tablets (12.5 mg total) by mouth 2 (two) times daily.  . [DISCONTINUED] docusate sodium (COLACE) 100 MG capsule Take 1 capsule (100 mg total) by mouth 2 (two) times daily. (Patient not taking: Reported on 04/13/2018)   No facility-administered encounter medications on file as of 04/13/2018.      Objective: Blood pressure 122/64, pulse 60, temperature (!) 97.5 F (36.4 C), temperature source Oral, resp. rate 18, height 5\' 8"  (1.727 m), weight 147 lb (66.7 kg). Patient is alert and in no acute distress. Conjunctiva is pink. Sclera is nonicteric Oropharyngeal mucosa is normal. No neck masses or thyromegaly noted. Cardiac exam with regular rhythm normal S1 and  S2. No murmur or gallop noted. Lungs are clear to auscultation. Abdomen is symmetrical.  He has a vertical scar in right lower quadrant from prior appendectomy.  He also has midline scar in upper abdomen reaching midline horizontal scar and mid abdomen.  Bowel sounds are normal.  Percussion note is somewhat tympanitic but not very pronounced.  He has cough impulse and right inguinal region  Consistent with inguinal hernia.  It is partially reducible.  It is not tender.  He has mild tenderness in fullness and right lower quadrant but no definite mass palpable.  No hepatosplenomegaly. No LE edema or  clubbing noted.  Labs/studies Results:  H&H was 11.9 35.7 on 03/09/2017.  Assessment:  #1.  Flatulence.  He has a history of small intestinal bacterial overgrowth.  He has been treated with antibiotics in the past.  He is using Phazyme on as-needed basis he can use it on schedule rather than as needed.  He may need another course of antibiotic but first will rule out other conditions.  #2.  Chronic constipation.  He needs to take Colace on a regular basis.  If this does not help we will consider other treatment options.  Last colonoscopy was about there is indication for colonoscopy at this time.  1415 years ago.  I do not feel  #3.  Lower abdominal pain.  This is a new symptom since he was last seen.  He does have right inguinal hernia which is reducible.  He has fullness in right lower quadrant of abdomen.  Source of his pain is not clear.  Given his anorexia and weight loss need to proceed with further testing.  #4.  History of anemia.  He has progressive weakness.  Therefore will check CBC to make sure his anemia is not worse.  #5.  Weight loss.  Weight loss appears to be due to diminished oral intake.  Remains to be seen if it is due to occult process.  #6.  Muscle weakness.  He is on statin.  Will check CPK.  He is not physically very active and he therefore may be deconditioned.  He also has generalized weakness which may be due to his medication such as hydralazine. He may want to check with Dr. Georgina Peer on his next visit if dose could be reduced.  #7.  Right inguinal hernia.  This is a new diagnosis.  Hernia is reducible.  He may consider surgery even at his age because of history of obstruction or incarceration.  He will talk with Dr. about the referral.Vyas  Plan:  Colace 200 mg p.o. Nightly. Patient will take Phazyme 1 tablet twice daily. Patient encouraged to increase physical activity gradually.  He may want to go back to [facility] and could be supervised by her trainer. Patient will  go to the lab for CBC and comprehensive chemistry panel as well as CPK.   Abdominopelvic CT with contrast if renal function permits.   Further recommendations to follow.   Office visit in 3 months.

## 2018-04-14 DIAGNOSIS — R634 Abnormal weight loss: Secondary | ICD-10-CM | POA: Diagnosis not present

## 2018-04-14 DIAGNOSIS — M6281 Muscle weakness (generalized): Secondary | ICD-10-CM | POA: Diagnosis not present

## 2018-04-14 DIAGNOSIS — D649 Anemia, unspecified: Secondary | ICD-10-CM | POA: Diagnosis not present

## 2018-04-14 DIAGNOSIS — R1031 Right lower quadrant pain: Secondary | ICD-10-CM | POA: Diagnosis not present

## 2018-04-15 ENCOUNTER — Other Ambulatory Visit (INDEPENDENT_AMBULATORY_CARE_PROVIDER_SITE_OTHER): Payer: Self-pay | Admitting: *Deleted

## 2018-04-15 DIAGNOSIS — R634 Abnormal weight loss: Secondary | ICD-10-CM

## 2018-04-15 LAB — COMPREHENSIVE METABOLIC PANEL
ALT: 43 IU/L (ref 0–44)
AST: 44 IU/L — ABNORMAL HIGH (ref 0–40)
Albumin/Globulin Ratio: 2.1 (ref 1.2–2.2)
Albumin: 3.6 g/dL (ref 3.5–4.7)
Alkaline Phosphatase: 74 IU/L (ref 39–117)
BUN/Creatinine Ratio: 21 (ref 10–24)
BUN: 34 mg/dL — ABNORMAL HIGH (ref 8–27)
Bilirubin Total: 0.9 mg/dL (ref 0.0–1.2)
CALCIUM: 8.5 mg/dL — AB (ref 8.6–10.2)
CO2: 19 mmol/L — AB (ref 20–29)
CREATININE: 1.64 mg/dL — AB (ref 0.76–1.27)
Chloride: 109 mmol/L — ABNORMAL HIGH (ref 96–106)
GFR calc Af Amer: 42 mL/min/{1.73_m2} — ABNORMAL LOW (ref >59)
GFR, EST NON AFRICAN AMERICAN: 37 mL/min/{1.73_m2} — AB (ref >59)
Globulin, Total: 1.7 g/dL (ref 1.5–4.5)
Glucose: 143 mg/dL — ABNORMAL HIGH (ref 65–99)
Potassium: 4.7 mmol/L (ref 3.5–5.2)
Sodium: 141 mmol/L (ref 134–144)
Total Protein: 5.3 g/dL — ABNORMAL LOW (ref 6.0–8.5)

## 2018-04-15 LAB — CBC
HEMOGLOBIN: 11.2 g/dL — AB (ref 13.0–17.7)
Hematocrit: 32.2 % — ABNORMAL LOW (ref 37.5–51.0)
MCH: 33.5 pg — AB (ref 26.6–33.0)
MCHC: 34.8 g/dL (ref 31.5–35.7)
MCV: 96 fL (ref 79–97)
Platelets: 148 10*3/uL — ABNORMAL LOW (ref 150–450)
RBC: 3.34 x10E6/uL — AB (ref 4.14–5.80)
RDW: 14 % (ref 12.3–15.4)
WBC: 4.3 10*3/uL (ref 3.4–10.8)

## 2018-04-15 LAB — CK: CK TOTAL: 96 U/L (ref 24–204)

## 2018-04-19 ENCOUNTER — Other Ambulatory Visit (INDEPENDENT_AMBULATORY_CARE_PROVIDER_SITE_OTHER): Payer: Self-pay | Admitting: *Deleted

## 2018-04-19 DIAGNOSIS — D649 Anemia, unspecified: Secondary | ICD-10-CM

## 2018-04-19 DIAGNOSIS — R748 Abnormal levels of other serum enzymes: Secondary | ICD-10-CM

## 2018-04-20 ENCOUNTER — Other Ambulatory Visit (INDEPENDENT_AMBULATORY_CARE_PROVIDER_SITE_OTHER): Payer: Self-pay | Admitting: *Deleted

## 2018-04-20 ENCOUNTER — Ambulatory Visit (HOSPITAL_COMMUNITY)
Admission: RE | Admit: 2018-04-20 | Discharge: 2018-04-20 | Disposition: A | Discharge status: Home / Self Care | Payer: Medicare Other | Source: Ambulatory Visit | Attending: Internal Medicine | Admitting: Internal Medicine

## 2018-04-20 ENCOUNTER — Encounter (HOSPITAL_COMMUNITY): Payer: Self-pay

## 2018-04-20 DIAGNOSIS — R634 Abnormal weight loss: Secondary | ICD-10-CM | POA: Diagnosis not present

## 2018-04-20 DIAGNOSIS — I700 Atherosclerosis of aorta: Secondary | ICD-10-CM | POA: Insufficient documentation

## 2018-04-20 DIAGNOSIS — R7989 Other specified abnormal findings of blood chemistry: Secondary | ICD-10-CM

## 2018-04-20 DIAGNOSIS — I2510 Atherosclerotic heart disease of native coronary artery without angina pectoris: Secondary | ICD-10-CM | POA: Diagnosis not present

## 2018-04-20 MED ORDER — IOPAMIDOL (ISOVUE-300) INJECTION 61%
80.0000 mL | Freq: Once | INTRAVENOUS | Status: AC | PRN
  Administered 2018-04-20: 80 mL via INTRAVENOUS

## 2018-04-21 ENCOUNTER — Telehealth (INDEPENDENT_AMBULATORY_CARE_PROVIDER_SITE_OTHER): Payer: Self-pay | Admitting: Internal Medicine

## 2018-04-21 DIAGNOSIS — R7989 Other specified abnormal findings of blood chemistry: Secondary | ICD-10-CM | POA: Diagnosis not present

## 2018-04-21 NOTE — Telephone Encounter (Signed)
Dr.Rehman will be made aware. 

## 2018-04-21 NOTE — Telephone Encounter (Signed)
Patient called stated he will be going out of town tomorrow, Thursday and will be gone until Tuesday the 25th.  He would like to know the results of his CT scan. Please call [phone]

## 2018-04-22 LAB — CREATININE, SERUM
Creatinine, Ser: 1.21 mg/dL (ref 0.76–1.27)
GFR calc Af Amer: 61 mL/min/{1.73_m2} (ref >59)
GFR, EST NON AFRICAN AMERICAN: 53 mL/min/{1.73_m2} — AB (ref >59)

## 2018-04-22 LAB — BUN: BUN: 20 mg/dL (ref 8–27)

## 2018-04-22 NOTE — Telephone Encounter (Signed)
Dr.Rehman was made aware. 

## 2018-04-26 ENCOUNTER — Encounter (INDEPENDENT_AMBULATORY_CARE_PROVIDER_SITE_OTHER): Payer: Self-pay | Admitting: *Deleted

## 2018-04-26 ENCOUNTER — Other Ambulatory Visit (INDEPENDENT_AMBULATORY_CARE_PROVIDER_SITE_OTHER): Payer: Self-pay | Admitting: *Deleted

## 2018-04-26 DIAGNOSIS — D649 Anemia, unspecified: Secondary | ICD-10-CM

## 2018-04-26 DIAGNOSIS — R748 Abnormal levels of other serum enzymes: Secondary | ICD-10-CM

## 2018-05-03 DIAGNOSIS — D649 Anemia, unspecified: Secondary | ICD-10-CM | POA: Diagnosis not present

## 2018-05-03 DIAGNOSIS — R748 Abnormal levels of other serum enzymes: Secondary | ICD-10-CM | POA: Diagnosis not present

## 2018-05-04 LAB — BASIC METABOLIC PANEL
BUN/Creatinine Ratio: 15 (ref 10–24)
BUN: 21 mg/dL (ref 8–27)
CO2: 19 mmol/L — ABNORMAL LOW (ref 20–29)
Calcium: 8.5 mg/dL — ABNORMAL LOW (ref 8.6–10.2)
Chloride: 106 mmol/L (ref 96–106)
Creatinine, Ser: 1.42 mg/dL — ABNORMAL HIGH (ref 0.76–1.27)
GFR calc Af Amer: 50 mL/min/{1.73_m2} — ABNORMAL LOW (ref >59)
GFR calc non Af Amer: 43 mL/min/{1.73_m2} — ABNORMAL LOW (ref >59)
Glucose: 99 mg/dL (ref 65–99)
Potassium: 4.4 mmol/L (ref 3.5–5.2)
Sodium: 136 mmol/L (ref 134–144)

## 2018-05-04 LAB — ALT: ALT: 72 IU/L — ABNORMAL HIGH (ref 0–44)

## 2018-05-04 LAB — AST: AST: 61 IU/L — ABNORMAL HIGH (ref 0–40)

## 2018-05-07 ENCOUNTER — Other Ambulatory Visit (INDEPENDENT_AMBULATORY_CARE_PROVIDER_SITE_OTHER): Payer: Self-pay | Admitting: *Deleted

## 2018-05-07 ENCOUNTER — Telehealth (INDEPENDENT_AMBULATORY_CARE_PROVIDER_SITE_OTHER): Payer: Self-pay | Admitting: *Deleted

## 2018-05-07 DIAGNOSIS — I471 Supraventricular tachycardia: Secondary | ICD-10-CM | POA: Diagnosis not present

## 2018-05-07 DIAGNOSIS — I10 Essential (primary) hypertension: Secondary | ICD-10-CM | POA: Diagnosis not present

## 2018-05-07 DIAGNOSIS — Z6824 Body mass index (BMI) 24.0-24.9, adult: Secondary | ICD-10-CM | POA: Diagnosis not present

## 2018-05-07 DIAGNOSIS — I639 Cerebral infarction, unspecified: Secondary | ICD-10-CM | POA: Diagnosis not present

## 2018-05-07 DIAGNOSIS — K4090 Unilateral inguinal hernia, without obstruction or gangrene, not specified as recurrent: Secondary | ICD-10-CM | POA: Diagnosis not present

## 2018-05-07 DIAGNOSIS — R748 Abnormal levels of other serum enzymes: Secondary | ICD-10-CM

## 2018-05-07 DIAGNOSIS — Z299 Encounter for prophylactic measures, unspecified: Secondary | ICD-10-CM | POA: Diagnosis not present

## 2018-05-07 DIAGNOSIS — D709 Neutropenia, unspecified: Secondary | ICD-10-CM | POA: Diagnosis not present

## 2018-05-07 NOTE — Telephone Encounter (Signed)
I talked with patient.  He was diagnosed with inguinal hernia on his last visit. He has  pain when he ambulates stays busy. He does not have any symptoms of nausea or vomiting. Patient is at risk for obstruction/strangulation. I advised patient to talk with Dr. Woody Seller about surgical referral.

## 2018-05-07 NOTE — Telephone Encounter (Signed)
Patient called and states that his hernia is really bothering him in his groin area. It is bulging and is hurting all the time. He is asking for a update from [facility]. Patient ask that we call his cell number with the recommendation.  [phone].  Dr.Rehman made aware.

## 2018-05-11 ENCOUNTER — Telehealth (INDEPENDENT_AMBULATORY_CARE_PROVIDER_SITE_OTHER): Payer: Self-pay | Admitting: Internal Medicine

## 2018-05-11 NOTE — Telephone Encounter (Signed)
Patient called would like you to send copies of his latest lab results - has cardiologist appointment

## 2018-05-11 NOTE — Telephone Encounter (Signed)
Labs printed and mailed to patient

## 2018-05-13 DIAGNOSIS — K4090 Unilateral inguinal hernia, without obstruction or gangrene, not specified as recurrent: Secondary | ICD-10-CM | POA: Diagnosis not present

## 2018-05-14 ENCOUNTER — Telehealth: Payer: Self-pay | Admitting: *Deleted

## 2018-05-14 DIAGNOSIS — I480 Paroxysmal atrial fibrillation: Secondary | ICD-10-CM

## 2018-05-14 DIAGNOSIS — Z79899 Other long term (current) drug therapy: Secondary | ICD-10-CM

## 2018-05-14 NOTE — Telephone Encounter (Signed)
   [facility] Pre-operative Risk Assessment    Request for surgical clearance:  1. What type of surgery is being performed? Hernia repair surgery   2. When is this surgery scheduled? 05/18/18   3. What type of clearance is required (medical clearance vs. Pharmacy clearance to hold med vs. Both)? both  4. Are there any medications that need to be held prior to surgery and how long? Eliquis 3 days  5. Practice name and name of physician performing surgery? [facility] Dr. Michael Boston   6. What is your office phone number [phone]    7.   What is your office fax number [phone]  8.   Anesthesia type (None, local, MAC, general) ?    Dylan York 05/14/2018, 3:35 PM  _________________________________________________________________   (provider comments below)

## 2018-05-14 NOTE — Telephone Encounter (Signed)
Follow Up:; ° ° °Returning your call. °

## 2018-05-14 NOTE — Telephone Encounter (Signed)
Dr. Claiborne Billings, this pt states that his GI doctor is wondering if his amiodarone can be reduced as he had mild increase in some of his liver labs and his renal function is worsening.   He has an overdue follow up with you on 9/25. Would you like him seen sooner?  I went ahead and cleared him for hernia surgery.   Thank you

## 2018-05-14 NOTE — Telephone Encounter (Signed)
Pt takes Eliquis for afib with CHADS2VASc score of 6 (age x2, HTN, CHF, CVA). SCr 1.42, appropriately on 5mg  BID dosing although CrCl is 41mL/min. Recommend holding Eliquis for 36 hours prior (have pt skip 3 doses prior to procedure) due to stroke history but with reduced renal function.

## 2018-05-14 NOTE — Telephone Encounter (Signed)
   Primary Cardiologist: Shelva Majestic, MD  Chart reviewed as part of pre-operative protocol coverage. Patient was contacted 05/14/2018 in reference to pre-operative risk assessment for pending surgery as outlined below.  Dylan York was last seen on 06/16/2017 by Dr. Claiborne Billings.  Since that day, Dylan York has done well from a cardiac standpoint. He denies chest discomfort or new or worsening dyspnea. He does have longstanding mild DOE with walking a longer distance or carrying something. He has had no palpitations, lightheadedness or syncope. His heart rate has been stable 55-65. He is in need of a cardiology follow up and we will arrange. If needed he can proceed prior to the appointment. He has no hx of MI or CHF. He does have hx of stroke. His renal function has been worsening and this adds to surgical risk.   Therefore, based on ACC/AHA guidelines, the patient would be at acceptable risk for the planned procedure without further cardiovascular testing.   According to our pharmacy protocol: Pt takes Eliquis for afib with CHADS2VASc score of 6 (age x2, HTN, CHF, CVA). SCr 1.42, appropriately on 5mg  BID dosing although CrCl is 12mL/min. Recommend holding Eliquis for 36 hours prior (have pt skip 3 doses prior to procedure) due to stroke history but with reduced renal function.  I will route this recommendation to the requesting party via Epic fax function and remove from pre-op pool.  Please call with questions.  Daune Perch, NP 05/14/2018, 4:05 PM

## 2018-05-17 DIAGNOSIS — Z79899 Other long term (current) drug therapy: Secondary | ICD-10-CM | POA: Diagnosis not present

## 2018-05-17 DIAGNOSIS — I10 Essential (primary) hypertension: Secondary | ICD-10-CM | POA: Diagnosis not present

## 2018-05-17 DIAGNOSIS — Z8673 Personal history of transient ischemic attack (TIA), and cerebral infarction without residual deficits: Secondary | ICD-10-CM | POA: Diagnosis not present

## 2018-05-17 DIAGNOSIS — Z7902 Long term (current) use of antithrombotics/antiplatelets: Secondary | ICD-10-CM | POA: Diagnosis not present

## 2018-05-17 DIAGNOSIS — E039 Hypothyroidism, unspecified: Secondary | ICD-10-CM | POA: Diagnosis not present

## 2018-05-17 DIAGNOSIS — D709 Neutropenia, unspecified: Secondary | ICD-10-CM | POA: Diagnosis not present

## 2018-05-17 DIAGNOSIS — K4090 Unilateral inguinal hernia, without obstruction or gangrene, not specified as recurrent: Secondary | ICD-10-CM | POA: Diagnosis not present

## 2018-05-17 DIAGNOSIS — I480 Paroxysmal atrial fibrillation: Secondary | ICD-10-CM | POA: Diagnosis not present

## 2018-05-17 DIAGNOSIS — Z8249 Family history of ischemic heart disease and other diseases of the circulatory system: Secondary | ICD-10-CM | POA: Diagnosis not present

## 2018-05-17 DIAGNOSIS — R2681 Unsteadiness on feet: Secondary | ICD-10-CM | POA: Diagnosis not present

## 2018-05-17 DIAGNOSIS — Z809 Family history of malignant neoplasm, unspecified: Secondary | ICD-10-CM | POA: Diagnosis not present

## 2018-05-17 NOTE — Telephone Encounter (Signed)
Okay to try to decrease amiodarone to 200 mg daily.  Follow-up LFTs in 4 weeks as well as TSH.

## 2018-05-18 DIAGNOSIS — I10 Essential (primary) hypertension: Secondary | ICD-10-CM | POA: Diagnosis not present

## 2018-05-18 DIAGNOSIS — R2681 Unsteadiness on feet: Secondary | ICD-10-CM | POA: Diagnosis not present

## 2018-05-18 DIAGNOSIS — D709 Neutropenia, unspecified: Secondary | ICD-10-CM | POA: Diagnosis not present

## 2018-05-18 DIAGNOSIS — K4090 Unilateral inguinal hernia, without obstruction or gangrene, not specified as recurrent: Secondary | ICD-10-CM | POA: Diagnosis not present

## 2018-05-18 DIAGNOSIS — I480 Paroxysmal atrial fibrillation: Secondary | ICD-10-CM | POA: Diagnosis not present

## 2018-05-18 DIAGNOSIS — E039 Hypothyroidism, unspecified: Secondary | ICD-10-CM | POA: Diagnosis not present

## 2018-05-18 MED ORDER — AMIODARONE HCL 200 MG PO TABS: 200.0000 mg | ORAL_TABLET | Freq: Every day | ORAL | 3 refills | Status: DC

## 2018-05-18 NOTE — Telephone Encounter (Signed)
Left detailed message with recommendations.  Repeat labs ordered and mailed to patient.  Med list updated.   Advised to call with questions or concerns.

## 2018-05-19 DIAGNOSIS — I10 Essential (primary) hypertension: Secondary | ICD-10-CM | POA: Diagnosis not present

## 2018-05-19 DIAGNOSIS — I480 Paroxysmal atrial fibrillation: Secondary | ICD-10-CM | POA: Diagnosis not present

## 2018-05-19 DIAGNOSIS — D709 Neutropenia, unspecified: Secondary | ICD-10-CM | POA: Diagnosis not present

## 2018-05-19 DIAGNOSIS — E039 Hypothyroidism, unspecified: Secondary | ICD-10-CM | POA: Diagnosis not present

## 2018-05-19 DIAGNOSIS — K4090 Unilateral inguinal hernia, without obstruction or gangrene, not specified as recurrent: Secondary | ICD-10-CM | POA: Diagnosis not present

## 2018-05-19 DIAGNOSIS — R2681 Unsteadiness on feet: Secondary | ICD-10-CM | POA: Diagnosis not present

## 2018-05-24 DIAGNOSIS — Z466 Encounter for fitting and adjustment of urinary device: Secondary | ICD-10-CM | POA: Diagnosis not present

## 2018-05-31 ENCOUNTER — Other Ambulatory Visit (INDEPENDENT_AMBULATORY_CARE_PROVIDER_SITE_OTHER): Payer: Self-pay | Admitting: *Deleted

## 2018-05-31 ENCOUNTER — Encounter (INDEPENDENT_AMBULATORY_CARE_PROVIDER_SITE_OTHER): Payer: Self-pay | Admitting: *Deleted

## 2018-05-31 DIAGNOSIS — R748 Abnormal levels of other serum enzymes: Secondary | ICD-10-CM

## 2018-06-04 DIAGNOSIS — N411 Chronic prostatitis: Secondary | ICD-10-CM | POA: Diagnosis not present

## 2018-06-04 DIAGNOSIS — B9689 Other specified bacterial agents as the cause of diseases classified elsewhere: Secondary | ICD-10-CM | POA: Diagnosis not present

## 2018-06-04 DIAGNOSIS — N401 Enlarged prostate with lower urinary tract symptoms: Secondary | ICD-10-CM | POA: Diagnosis not present

## 2018-06-04 DIAGNOSIS — N281 Cyst of kidney, acquired: Secondary | ICD-10-CM | POA: Diagnosis not present

## 2018-06-04 DIAGNOSIS — R748 Abnormal levels of other serum enzymes: Secondary | ICD-10-CM | POA: Diagnosis not present

## 2018-06-04 DIAGNOSIS — N138 Other obstructive and reflux uropathy: Secondary | ICD-10-CM | POA: Diagnosis not present

## 2018-06-04 DIAGNOSIS — R82998 Other abnormal findings in urine: Secondary | ICD-10-CM | POA: Diagnosis not present

## 2018-06-05 LAB — HEPATIC FUNCTION PANEL
ALK PHOS: 100 IU/L (ref 39–117)
ALT: 217 IU/L — AB (ref 0–44)
AST: 140 IU/L — ABNORMAL HIGH (ref 0–40)
Albumin: 3.2 g/dL — ABNORMAL LOW (ref 3.5–4.7)
Bilirubin Total: 0.9 mg/dL (ref 0.0–1.2)
Bilirubin, Direct: 0.43 mg/dL — ABNORMAL HIGH (ref 0.00–0.40)
TOTAL PROTEIN: 5 g/dL — AB (ref 6.0–8.5)

## 2018-06-07 ENCOUNTER — Other Ambulatory Visit: Payer: Self-pay | Admitting: Cardiovascular Disease

## 2018-06-07 DIAGNOSIS — E785 Hyperlipidemia, unspecified: Secondary | ICD-10-CM

## 2018-06-08 ENCOUNTER — Ambulatory Visit (INDEPENDENT_AMBULATORY_CARE_PROVIDER_SITE_OTHER): Payer: Medicare Other | Admitting: Internal Medicine

## 2018-06-08 ENCOUNTER — Telehealth: Payer: Self-pay | Admitting: Cardiovascular Disease

## 2018-06-08 NOTE — Telephone Encounter (Signed)
Routed to MD

## 2018-06-08 NOTE — Telephone Encounter (Signed)
New message    Dr Laural Golden at [facility] requesting to speak with Dr Claiborne Billings- non urgent issue   Dr Laural Golden at [phone]

## 2018-06-11 ENCOUNTER — Encounter (INDEPENDENT_AMBULATORY_CARE_PROVIDER_SITE_OTHER): Payer: Self-pay | Admitting: *Deleted

## 2018-06-11 ENCOUNTER — Other Ambulatory Visit (INDEPENDENT_AMBULATORY_CARE_PROVIDER_SITE_OTHER): Payer: Self-pay | Admitting: *Deleted

## 2018-06-11 ENCOUNTER — Telehealth: Payer: Self-pay | Admitting: Cardiovascular Disease

## 2018-06-11 DIAGNOSIS — R748 Abnormal levels of other serum enzymes: Secondary | ICD-10-CM

## 2018-06-11 NOTE — Telephone Encounter (Signed)
Left message for pt to call back  °

## 2018-06-11 NOTE — Telephone Encounter (Signed)
New Message        Per patient's his GI he has asked him to stop taking the (Amiodorone). He was told it is affecting his liver.Patient would like a call back. Pls advise

## 2018-06-11 NOTE — Progress Notes (Signed)
hepat

## 2018-06-14 NOTE — Telephone Encounter (Signed)
F/u    Pt stating he is retuning call to nurse from Friday. Please call pt.

## 2018-06-14 NOTE — Telephone Encounter (Signed)
Would hold amiodarone and lipitor for now and review with Dr Claiborne Billings for further instructions Kirk Ruths

## 2018-06-14 NOTE — Telephone Encounter (Signed)
SPOKE TO PATIENT-- AWARE TO HOLD IN TAKING  AMIODARONE AND LIPTIOR UNTIL FURTHER NOTICE FROM DR Claiborne Billings OR DR [facility].

## 2018-06-14 NOTE — Telephone Encounter (Signed)
Spoke with patient . Patient states Dr Laural Golden wanted to Dr Claiborne Billings recommendation on patient stopping amiodarone -  Per patient-- taking 200 mg alternating 300 mg every other day  patient states  lver enzymes have been  elevated and Dr Lucianne Muss have been monitoring the results. (see labs in [facility]- LAB TAB)  Patient saw Dr Lucianne Muss last week.   patient aware will defer to Dr Claiborne Billings and pHarmacist

## 2018-06-14 NOTE — Telephone Encounter (Signed)
Noted LFTs 4-5x the normal limit. Agree with holding Amiodarone for now but need input from cardiologist.   Will forward to DOD for assessment.

## 2018-06-14 NOTE — Telephone Encounter (Signed)
Returned call to patient. [name] RN spoke with patient 30 mins prior. Advised him that his message has been forwarded to Dr. Claiborne Billings & our clinical pharmacist and we are awaiting a response. Will call as soon as info is received

## 2018-06-15 DIAGNOSIS — Z6823 Body mass index (BMI) 23.0-23.9, adult: Secondary | ICD-10-CM | POA: Diagnosis not present

## 2018-06-15 DIAGNOSIS — R634 Abnormal weight loss: Secondary | ICD-10-CM | POA: Diagnosis not present

## 2018-06-15 DIAGNOSIS — I480 Paroxysmal atrial fibrillation: Secondary | ICD-10-CM | POA: Diagnosis not present

## 2018-06-15 DIAGNOSIS — I10 Essential (primary) hypertension: Secondary | ICD-10-CM | POA: Diagnosis not present

## 2018-06-15 DIAGNOSIS — R5383 Other fatigue: Secondary | ICD-10-CM | POA: Diagnosis not present

## 2018-06-15 DIAGNOSIS — Z299 Encounter for prophylactic measures, unspecified: Secondary | ICD-10-CM | POA: Diagnosis not present

## 2018-06-15 NOTE — Telephone Encounter (Signed)
With elevated LFTs, agree with holding amiodarone and atorvastatin.  If patient notes increasing heart rate or recurrent a F, will need further adjustment of medications with beta-blocker

## 2018-06-15 NOTE — Telephone Encounter (Signed)
Spoke to patient . Aware to continue with holding amiodarone and atorvastatin. Aware to contact office if  Increase heart rate or atrial fib reoccurring.

## 2018-06-21 DIAGNOSIS — E871 Hypo-osmolality and hyponatremia: Secondary | ICD-10-CM | POA: Diagnosis not present

## 2018-06-24 ENCOUNTER — Other Ambulatory Visit: Payer: Self-pay | Admitting: Cardiovascular Disease

## 2018-06-25 DIAGNOSIS — R748 Abnormal levels of other serum enzymes: Secondary | ICD-10-CM | POA: Diagnosis not present

## 2018-06-26 LAB — HEPATIC FUNCTION PANEL
ALT: 72 IU/L — AB (ref 0–44)
AST: 53 IU/L — ABNORMAL HIGH (ref 0–40)
Albumin: 3.5 g/dL (ref 3.5–4.7)
Alkaline Phosphatase: 84 IU/L (ref 39–117)
BILIRUBIN TOTAL: 0.9 mg/dL (ref 0.0–1.2)
Bilirubin, Direct: 0.29 mg/dL (ref 0.00–0.40)
Total Protein: 5.5 g/dL — ABNORMAL LOW (ref 6.0–8.5)

## 2018-06-28 ENCOUNTER — Other Ambulatory Visit: Payer: Self-pay | Admitting: Cardiovascular Disease

## 2018-06-30 ENCOUNTER — Other Ambulatory Visit (INDEPENDENT_AMBULATORY_CARE_PROVIDER_SITE_OTHER): Payer: Self-pay | Admitting: *Deleted

## 2018-06-30 ENCOUNTER — Encounter (INDEPENDENT_AMBULATORY_CARE_PROVIDER_SITE_OTHER): Payer: Self-pay | Admitting: *Deleted

## 2018-06-30 DIAGNOSIS — R748 Abnormal levels of other serum enzymes: Secondary | ICD-10-CM

## 2018-07-07 ENCOUNTER — Telehealth: Payer: Self-pay | Admitting: Cardiovascular Disease

## 2018-07-07 NOTE — Telephone Encounter (Signed)
Spoke with Dylan York from [facility] and advised that per telephone encounter on 8/13 pt was instructed to hold amiodarone and atorvastatin. Verbalized understanding.

## 2018-07-07 NOTE — Telephone Encounter (Signed)
New Message:    Pharmacist calling to find out if pt have stopped taking his Atorvastatin and Amiodarone?

## 2018-07-12 DIAGNOSIS — B9689 Other specified bacterial agents as the cause of diseases classified elsewhere: Secondary | ICD-10-CM | POA: Diagnosis not present

## 2018-07-12 DIAGNOSIS — R82998 Other abnormal findings in urine: Secondary | ICD-10-CM | POA: Diagnosis not present

## 2018-07-12 DIAGNOSIS — N138 Other obstructive and reflux uropathy: Secondary | ICD-10-CM | POA: Diagnosis not present

## 2018-07-12 DIAGNOSIS — N401 Enlarged prostate with lower urinary tract symptoms: Secondary | ICD-10-CM | POA: Diagnosis not present

## 2018-07-12 DIAGNOSIS — N281 Cyst of kidney, acquired: Secondary | ICD-10-CM | POA: Diagnosis not present

## 2018-07-19 DIAGNOSIS — H401121 Primary open-angle glaucoma, left eye, mild stage: Secondary | ICD-10-CM | POA: Diagnosis not present

## 2018-07-26 ENCOUNTER — Other Ambulatory Visit: Payer: Self-pay | Admitting: Cardiovascular Disease

## 2018-07-27 ENCOUNTER — Telehealth: Payer: Self-pay | Admitting: Cardiovascular Disease

## 2018-07-27 MED ORDER — APIXABAN 5 MG PO TABS: 5.0000 mg | ORAL_TABLET | Freq: Two times a day (BID) | ORAL | 0 refills | Status: DC

## 2018-07-27 NOTE — Telephone Encounter (Signed)
Patient calling the office for samples of medication: ° ° °1.  What medication and dosage are you requesting samples for? apixaban (ELIQUIS) 5 MG TABS tablet ° °2.  Are you currently out of this medication? no ° ° °

## 2018-07-27 NOTE — Telephone Encounter (Signed)
PATIENT AWARE SAMPLES ARE AVAILABLE FOR PICK UP

## 2018-07-28 ENCOUNTER — Ambulatory Visit (INDEPENDENT_AMBULATORY_CARE_PROVIDER_SITE_OTHER): Payer: Medicare Other | Admitting: Cardiovascular Disease

## 2018-07-28 ENCOUNTER — Encounter: Payer: Self-pay | Admitting: Cardiovascular Disease

## 2018-07-28 VITALS — BP 138/75 | HR 54 | Ht 68.0 in | Wt 148.6 lb

## 2018-07-28 DIAGNOSIS — R945 Abnormal results of liver function studies: Secondary | ICD-10-CM | POA: Diagnosis not present

## 2018-07-28 DIAGNOSIS — I495 Sick sinus syndrome: Secondary | ICD-10-CM

## 2018-07-28 DIAGNOSIS — N183 Chronic kidney disease, stage 3 unspecified: Secondary | ICD-10-CM

## 2018-07-28 DIAGNOSIS — I480 Paroxysmal atrial fibrillation: Secondary | ICD-10-CM

## 2018-07-28 DIAGNOSIS — I63412 Cerebral infarction due to embolism of left middle cerebral artery: Secondary | ICD-10-CM

## 2018-07-28 DIAGNOSIS — Z7901 Long term (current) use of anticoagulants: Secondary | ICD-10-CM | POA: Diagnosis not present

## 2018-07-28 DIAGNOSIS — R7989 Other specified abnormal findings of blood chemistry: Secondary | ICD-10-CM

## 2018-07-28 MED ORDER — HYDROCHLOROTHIAZIDE 12.5 MG PO CAPS: 12.5000 mg | ORAL_CAPSULE | ORAL | 3 refills | Status: DC | PRN

## 2018-07-28 NOTE — Patient Instructions (Signed)
Medication Instructions:  START HCTZ 12.5 mg as needed for swelling  Follow-Up: Your physician wants you to follow-up in: 6 months with Dr. Claiborne Billings.  You will receive a reminder letter in the mail two months in advance. If you don't receive a letter, please call our office to schedule the follow-up appointment.   Any Other Special Instructions Will Be Listed Below (If Applicable).   How to Use Compression Stockings Compression stockings are elastic socks that squeeze the legs. They help to increase blood flow to the legs, decrease swelling in the legs, and reduce the chance of developing blood clots in the lower legs. Compression stockings are often used by people who:  Are recovering from surgery.  Have poor circulation in their legs.  Are prone to getting blood clots in their legs.  Have varicose veins.  Sit or stay in bed for long periods of time.  How to use compression stockings Before you put on your compression stockings:  Make sure that they are the correct size. If you do not know your size, ask your health care provider.  Make sure that they are clean, dry, and in good condition.  Check them for rips and tears. Do not put them on if they are ripped or torn.  Put your stockings on first thing in the morning, before you get out of bed. Keep them on for as long as your health care provider advises. When you are wearing your stockings:  Keep them as smooth as possible. Do not allow them to bunch up. It is especially important to prevent the stockings from bunching up around your toes or behind your knees.  Do not roll the stockings downward and leave them rolled down. This can decrease blood flow to your leg.  Change them right away if they become wet or dirty.  When you take off your stockings, inspect your legs and feet. Anything that does not seem normal may require medical attention. Look for:  Open sores.  Red spots.  Swelling.  Information and tips  Do not  stop wearing your compression stockings without talking to your health care provider first.  Wash your stockings every day with mild detergent in cold or warm water. Do not use bleach. Air-dry your stockings or dry them in a clothes dryer on low heat.  Replace your stockings every 3-6 months.  If skin moisturizing is part of your treatment plan, apply lotion or cream at night so that your skin will be dry when you put on the stockings in the morning. It is harder to put the stockings on when you have lotion on your legs or feet. Contact a health care provider if: Remove your stockings and seek medical care if:  You have a feeling of pins and needles in your feet or legs.  You have any new changes in your skin.  You have skin lesions that are getting worse.  You have swelling or pain that is getting worse.  Get help right away if:  You have numbness or tingling in your lower legs that does not get better right after you take the stockings off.  Your toes or feet become cold and blue.  You develop open sores or red spots on your legs that do not go away.  You see or feel a warm spot on your leg.  You have new swelling or soreness in your leg.  You are short of breath or you have chest pain for no reason.  You  have a rapid or irregular heartbeat.  You feel light-headed or dizzy. This information is not intended to replace advice given to you by your health care provider. Make sure you discuss any questions you have with your health care provider. Document Released: 08/17/2009 Document Revised: 03/19/2016 Document Reviewed: 09/27/2014 [employer]vier Interactive Patient Education  [employer].    If you need a refill on your cardiac medications before your next appointment, please call your pharmacy.

## 2018-07-28 NOTE — Progress Notes (Signed)
Patient ID: Dylan York, male   DOB: 11-03-1926, 84 y.o.   MRN: [id number]     HPI: Dylan York is a 84 y.o. male who presents to the office for a 13 month  followup cardiology evaluation.  Dylan York has a history of sick sinus syndrome with atrial tachycardia as well as sinus bradycardia, ventricular bigeminy, PVCs, PAF and difficult to control hypertension  He had stabilized with Bystolic but when he was seen on 03/08/2013 he was in atrial fibrillation of questionable duration. At that time I increased his Bystolic and his started anticoagulation with Eliquis 2.5 mg twice a day. An echo Doppler study on 03/30/2013 revealed normal systolic function with an ejection fraction of 60-65%, aortic valve sclerosis, biatrial enlargement, as well as mild pulmonary hypertension with a PA pressure of 41 mm. On 04/04/2013 AF 77 rate was beats per minute. He was initially started  on Multaq 400 mg twice a day but did not tolerate this and ultimately discontinue its use.  When  l saw him on 04/26/2013 I started him on amiodarone 200 mg daily. He has tolerated this well. 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  On 05/27/2013 I further titrated his amiodarone to 200 mg twice a day. I also recommended that he reduce his Bystolic from 10 to 5 mg with a plan to do a cardioversion but as part of his preoperative assessment he was found to have pneumonia. His  pneumonia was treated with antibiotic therapy by Dylan York in [place]. On 08/05/2013 he was seen in the office as an add-on at which time he had significant accelerated hypertension with a blood pressure of 220/100. He was seen by Dylan York and admitted to [facility] hospital. He  was treated with IV Cardene and he converted to sinus rhythm. Ultimately nicardipine was discontinued and amlodipine was started as well as hydralazine. He was discharged home on 08/09/2013.  His heart rhythm has been fairly stable. He has noticed some mild ankle swelling right greater than left. Lower extremityDoppler studies were negative for DVT. On 08/23/2013  amlodipine was reduced from 10 to7.5 mg and I reduced his amiodarone from 400 mg to 300 mg. Subsequently, he now is on a further reduce dose of amlodipine at just 2.5 mg daily. He does note continued increasing lower extremity edema, right leg greater than left. He has not been taking his furosemide  20 mg regularly.  On 10/31/2013 I discontinued his amlodipine and recommended furosemide 20 mg daily due to his significant  pretibial and ankle edema. We also discussed sodium restriction. Since that time he has been using 20 -30 mm  support stockings below the knee with marked benefit.  A renal duplex scan in February 2015 demonstrated right renal artery narrowing proximally in the range of 1-59% with velocities at 213, suggesting the upper end of the range.  His left renal artery had normal pain. He  He has numerous large anechoic cysts in both kidneys with the largest measuring 8.9 x 7.6 x 8.0.  He is also followed by Dylan York urologically.  On 01/12/2015.  He underwent a follow-up renal duplex scan.  He again had mild bilateral renal artery narrowing at the right renal artery.  Increased velocity proximally at 185 in the left renal artery.  A 206 consistent with a 159% range diameter reduction.  The left kidney again showed  numerous an echo.  It cysts.  The 2 largest measured 7.08.3 x a 8.0 cm and [id number] cm.  Left kidney could not accurately be measured in length.  Due to large cyst within the lower pole of the kidney.  Dylan York has had problems with significant blood pressure lability and when seen in the past was taking multiple medications at different times throughout the day.  At that time he was taking Cardura 4 mg daily, hydralazine 50 mg twice a day , and an another dose at 25 mg, furosemide 20 mg daily, valsartan 320  mg.  He also is on levothyroxine 50 g for hypothyroidism.  He takes clonidine 0.1 mg if his blood pressure gets above 737 systolically.  He is also continuing to take amiodarone at 100 mg alternating with 200 mg and denies recurrent AF.  He is anticoagulated with eliquis.  He is very analytical and over the past 6 months he states that he has taken 607 blood pressure readings.  32 readings or 5% revealed blood pressure elevation 106 systolically for which he is took clonidine 5 times or 0.8%.    He has seen Dylan York on several occasions with significant blood pressure lability with blood pressure readings in excess of 269 systolically and diastolically over 90.  He also states at some times  his blood pressure may get low.  She had tried him on chlorthalidone, but a subsequent be met showed low potassium and sodium and it was discontinued.  At times he has noticed a sensation where he may have had transient atrial fibrillation.   When I last saw him, I tried to simplify his medications and recently he has been taking hydralazine 50 mg every 8 hours, valsartan 320 mg in the morning, Cardura 4 mg, amiodarone 100 mg and if he notes his blood pressure getting in excess of 200 he takes  0.1 mg clonidine PRN. At that time, it sounded like he may have had an episode of breakthrough atrial fibrillation and I slightly adjusted his amiodarone to 100 mg alternating with 200 mg every other day.  He brought with him his blood pressure data over the last several months including means and averages his blood pressure had been running in the 140-150 range, but there have been instances where his blood pressure is in excess of 200.  At his last office visit, I suggested the initiation of spironolactone which he took 12.5 mg for one week and then increase this to 12.5 mg twice a day.  This has resulted in significant stabilization of his blood pressure lability.    Since I last saw him, he apparently developed a small ischemic  CVA in 09/07/2017 and was hospitalized for 3 days was evaluated by Dr. Rigoberto Noel.  He presented with transient right hemianopia for 5-6 hours.  An MRI showed small cortical and subcortical left parieto-occipital infarct with petechial hemorrhagic transformation.  2.  The petechial hemorrhage is eloquent's was discontinued and he was started on aspirin for 7 days before plans to restart eliquis at 5 mg twice a day.  He was started on Lipitor 20 mg.  His neurologic symptoms completely resolved.  He has continued to have daily blood pressure readings as well as heart rate readings are charted out.  He did notice on January 11 that his mean pulse had risen to 62 wears previously was 62.  Several days later.  He apparently called the office and came 14 ECG.  At that time he was told  that his ECG did not show atrial fibrillation.  I was out of town, and ECG was apparently shown to another physician.  Since that time, he has noticed some mild irregularity to his pulse rate.  He believes his blood pressure has been fairly well controlled since spironolactone was added to his regimen.  He denies chest pain.  He did trip and fall on one occasion and landed on his wrists and has been wearing wrist support for stabilization.    When I saw him on 02/02/2017, his ECG demonstrated atrial flutter with variable block and a ventricular rate in the 80s.  There are nonspecific ST-T changes.  At that time, he had been taking amiodarone at 100 mg daily.  He was on anticoagulation with eliquis 5 mg bid.  At that time, I recommended initial titration of amiodarone back to 2 mg twice a day for 1 week and with his history of bradycardia in the past.  I recommended that after one week he reduce this back to 300 mg daily.  He has continued to monitor his heart rate of blood pressure recordings at home.  Laboratory was done on 03/09/2017.  Hemoglobin and hematocrit were stable, but he remain mildly anemic.  Creatinine was 1.33 which was stable.   LFTs were normal.  TSH was borderline increased at 5.7.  Lipid studies were excellent with a total cholesterol 122, and LDL 43.   I ast saw him in August 2018. Grafts and data points from over the last several months showed a mean blood pressure in the 110-120 range and pulse rate typically at 56.  He denies awareness of recurrent atrial fibrillation.  He denied palpitations.  He  had noted dark stools but on evaluation, this was heme-negative.    Since I last saw him he has been doing well from a cardiac standpoint.  He underwent right hernia surgery eating and tolerated this well.  He developed increasing liver function tests leading to discontinuance of amiodarone as well as atorvastatin.  He also developed a bladder infection and has had some issues with urinary incontinence.  He currently has been on antibiotic regimen of Cipro and is scheduled to see Dylan York in follow-up.  He continues to monitor his blood pressure.  His blood pressure over the past month has been well controlled with a mean at 114/64 and pulse at 67.  He feels improved.  He is noticed fluid accumulation anterior to his left knee.  He also admits to some occasional lower extremity edema.  He denies chest pain.  He is unaware of recurrent atrial fibrillation.  He presents for reevaluation.  Allergies  Allergen Reactions  . Multaq [Dronedarone] Shortness Of Breath    Current Outpatient Medications  Medication Sig Dispense Refill  . apixaban (ELIQUIS) 5 MG TABS tablet Take 1 tablet (5 mg total) by mouth 2 (two) times daily. 28 tablet 0  . COMBIGAN 0.2-0.5 % ophthalmic solution Place 1 drop into both eyes 2 (two) times daily.    Marland Kitchen docusate sodium (COLACE) 100 MG capsule Take 2 capsules (200 mg total) by mouth at bedtime. 60 capsule 0  . doxazosin (CARDURA) 4 MG tablet Take 1 tablet (4 mg total) by mouth daily. 30 tablet 6  . finasteride (PROSCAR) 5 MG tablet Take 5 mg by mouth daily.    . hydrALAZINE (APRESOLINE) 50 MG  tablet Take 1 tablet (50 mg total) by mouth every 8 (eight) hours. KEEP OV. 270 tablet 0  . levothyroxine (  SYNTHROID, LEVOTHROID) 50 MCG tablet Take 50 mcg by mouth daily.    Marland Kitchen LORazepam (ATIVAN) 1 MG tablet TAKE 1/2 TABLET OR 1 TABLET BY MOUTH AT BEDTIME AS NEEDED  2  . Multiple Vitamins-Minerals (MULTIVITAMIN PO) Take by mouth daily.    . Simethicone (PHAZYME) 180 MG CAPS Take by mouth daily.    Marland Kitchen spironolactone (ALDACTONE) 25 MG tablet Take 0.5 tablets (12.5 mg total) by mouth 2 (two) times daily. KEEP OV. 90 tablet 0  . hydrochlorothiazide (MICROZIDE) 12.5 MG capsule Take 1 capsule (12.5 mg total) by mouth as needed. 30 capsule 3   No current facility-administered medications for this visit.     Socially he is married and has 3 children he quit tobacco in 1978. 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  ROS General: Negative; No fevers, chills, or night sweats;  HEENT: Positive for history of glaucoma No changes in vision or hearing, sinus congestion, difficulty swallowing Pulmonary: Negative; No cough, wheezing, shortness of breath, hemoptysis Cardiovascular: Negative; No chest pain, presyncope, syncope, palpitations Positive for intermittent leg swelling GI: Negative; No nausea, vomiting, diarrhea, or abdominal pain GU: Positive for renal cysts; No dysuria, hematuria, or difficulty voiding Musculoskeletal: Negative; no myalgias, joint pain, or weakness Hematologic/Oncology: Negative; no easy bruising, bleeding Endocrine: Negative; no heat/cold intolerance; no diabetes Neuro: Negative; no changes in balance, headaches Skin: Negative; No rashes or skin lesions Psychiatric: Negative; No behavioral problems, depression Sleep: Negative; No snoring, daytime sleepiness, hypersomnolence, bruxism, restless legs, hypnogognic hallucinations, no cataplexy Other comprehensive 14 point system review is negative.    PE BP 138/75   Pulse (!) 54   Ht  '5\' 8"'$  (1.727 m)   Wt 148 lb 9.6 oz (67.4 kg)   BMI 22.59 kg/m    Repeat blood pressure by me was 140/70  Wt Readings from Last 3 Encounters:  07/28/18 148 lb 9.6 oz (67.4 kg)  04/13/18 147 lb (66.7 kg)  11/23/17 155 lb 3.2 oz (70.4 kg)   General: Alert, oriented, no distress.  Skin: normal turgor, no rashes, warm and dry HEENT: Normocephalic, atraumatic. Pupils equal round and reactive to light; sclera anicteric; extraocular muscles intact;  Nose without nasal septal hypertrophy Mouth/Parynx benign; Mallinpatti scale 3 Neck: No JVD, no carotid bruits; normal carotid upstroke Lungs: clear to ausculatation and percussion; no wheezing or rales Chest wall: without tenderness to palpitation Heart: PMI not displaced, RRR, s1 s2 normal, 1/6 systolic murmur, no diastolic murmur, no rubs, gallops, thrills, or heaves Abdomen: Probable mild incisional hernia.  Status post right inguinal hernia surgery.  Soft, nontender; no hepatosplenomehaly, BS+; abdominal aorta nontender and not dilated by palpation. Back: no CVA tenderness Pulses 2+ Musculoskeletal: Left knee superficial cyst/fluid collection; full range of motion, normal strength, no joint deformities Extremities: Trace to 1+ lower extremity edema ; no clubbing cyanosis, Homan's sign negative  Neurologic: grossly nonfocal; Cranial nerves grossly wnl Psychologic: Normal mood and affect   ECG (independently read by me): Sinus bradycardia 54 bpm.  Long QT interval at 41 ms.  No ST segment changes.  No ectopy.  August 2018 ECG (independently read by me): Sinus bradycardia at 47 bpm with first-degree AV block with a PR interval at 232 ms.  No significant ST-T changes.  May 2018 ECG (independently read by me): Sinus bradycardia at 57 bpm with first degree AV block with a PR interval of 256 ms.  No significant ST-T changes.  02/02/2017 ECG (independently  read by me): Atrial flutter with variable block with ventricular rate in the 80s.   Nonspecific ST-T changes.  QTc interval 493 ms.  October 2017 ECG (independently read by me): Sinus bradycardia at 43 bpm with a meal.  First-degree block.  PAC.  QTc interval normal.  July 2017 ECG (independently read by me): Sinus rhythm but bradycardic in the 40s with sinus arrhythmia.  Nonspecific ST changes.  April 2017 ECG (independently read by me): August bradycardia at 53 bpm.  Sinus arrhythmia.  Nonspecific ST changes.  09/24/2015 ECG (independently read by me): Sinus bradycardia, first-degree AV block.  Ventricular rate 51.  PR interval 218 ms.  Nondiagnostic ST changes  May 2016 ECG (independently read by me): Sinus bradycardia 52 bpm.  PR interval 198 ms, QTc interval 451 ms.  Nonspecific interventricular conduction delay.  February 2016 ECG (independently read by me, and (: Sinus bradycardia 48 bpm.  Nonspecific conduction delay.  No significant ST abnormalities.  QTc interval 434 ms.  November 2015 ECG (independently read by me):  Sinus bradycardia 52 bpm.  PR interval 216 ms.  No ectopy.  July 2015ECG (independently read by me): Sinus bradycardia with first degree AV block.  PR interval 240 ms.  Nonspecific ST changes.  PAC.  Prior 01/10/2014 ECG (independently read by me) sinus bradycardia 53 beats per minute. QTc interval 457 ms. No ectopy.  Prior 12/12/2013 ECG  (independently read by me): Sinus rhythm at 56 beats per minute. PR interval 206 ms. QTc interval 470 ms   Prior ECG of 10/31/2013: Normal sinus rhythm at 61 beats per minute. Isolated  PVC.  Non-specific intra-ventricular conduction delay. Nonspecific ST-T changes. QTc interval 461 ms.  LABS:  BMP Latest Ref Rng & Units 05/03/2018 04/21/2018 04/14/2018  Glucose 65 - 99 mg/dL 99 - 143(H)  BUN 8 - 27 mg/dL 21 20 34(H)  Creatinine 0.76 - 1.27 mg/dL 1.42(H) 1.21 1.64(H)  BUN/Creat Ratio 10 - 24 15 - 21  Sodium 134 - 144 mmol/L 136 - 141  Potassium 3.5 - 5.2 mmol/L 4.4 - 4.7  Chloride 96 - 106 mmol/L 106 - 109(H)    CO2 20 - 29 mmol/L 19(L) - 19(L)  Calcium 8.6 - 10.2 mg/dL 8.5(L) - 8.5(L)    Hepatic Function Latest Ref Rng & Units 06/25/2018 06/04/2018 05/03/2018  Total Protein 6.0 - 8.5 g/dL 5.5(L) 5.0(L) -  Albumin 3.5 - 4.7 g/dL 3.5 3.2(L) -  AST 0 - 40 IU/L 53(H) 140(H) 61(H)  ALT 0 - 44 IU/L 72(H) 217(H) 72(H)  Alk Phosphatase 39 - 117 IU/L 84 100 -  Total Bilirubin 0.0 - 1.2 mg/dL 0.9 0.9 -  Bilirubin, Direct 0.00 - 0.40 mg/dL 0.29 0.43(H) -    CBC Latest Ref Rng & Units 04/14/2018 03/09/2017 09/07/2016  WBC 3.4 - 10.8 x10E3/uL 4.3 5.2 5.1  Hemoglobin 13.0 - 17.7 g/dL 11.2(L) 11.9(L) 11.6(L)  Hematocrit 37.5 - 51.0 % 32.2(L) 35.7(L) 34.3(L)  Platelets 150 - 450 x10E3/uL 148(L) 165 159   Lab Results  Component Value Date   MCV 96 04/14/2018   MCV 97 03/09/2017   MCV 98.0 09/07/2016   Lab Results  Component Value Date   TSH 5.790 (H) 03/09/2017   Lab Results  Component Value Date   HGBA1C 5.6 09/07/2016     Lipid Panel     Component Value Date/Time   CHOL 122 03/09/2017 0831   TRIG 75 03/09/2017 0831   HDL 64 03/09/2017 0831   CHOLHDL 3.1 09/08/2016 0507  VLDL 18 09/08/2016 0507   LDLCALC 43 03/09/2017 0831      RADIOLOGY: No results found.  IMPRESSION:  1. Sick sinus syndrome ([place])   2. PAF (paroxysmal atrial fibrillation) ([place])   3. Anticoagulated   4. Abnormal LFTs   5. CKD (chronic kidney disease), stage III [facility])     ASSESSMENT AND PLAN: Mr. Delis is a Young appearing 84 year-old white male who has a history of PAF and had maintained sinus rhythm following initiation of amiodarone.  And had been maintaining normal sinus rhythm on amiodarone.  Remotely, he had been on amiodarone and his dose had been tapered down to 100 alternating with 200 every other day due to bradycardia.  In November 2017 he developed transient right hemianopsia for 5-6 hours and on MRI imaging and was found to have small right cortical and subcortical left parieto-occipital infarct with  petechial hemorrhage.  His eliquis was held and he was treated with full dose aspirin for 1 week and ultimately eliquis was resumed by the neurologist at 5 mg twice a day.  In January 2018 , he notices pulse rate had increased.  I reviewed his ECG from January 22 and by my review, it appears that the patient was in atrial flutter at that time.  When I saw him on 02/02/2017, his ECG confirmed atrial flutter with variable block.  I further titrated amiodarone to 400 mg a day for 1 week and since then he has been on 300 mg daily.  When I last saw him, he was maintaining sinus rhythm with ventricular rate at 57.  LFTs were normal.  TSH was minimally increased.   Over the past several months, his LFTs were found to be elevated and he has seen Dr. Jearld Adjutant in [place].  AST was 140 with an ALT of 217.  His amiodarone dose was discontinued as well as his atorvastatin.  He is unaware of any breakthrough atrial fibrillation being off amiodarone.  He continues to be on Eliquis for anticoagulation.  Most recently he has been on spironolactone 12.5 mg twice a day in addition to hydralazine 50 mill grams every 8 hours and doxazosin 4 mg daily for blood pressure control.  His blood pressure has been stable and he is continued to be meticulous with reference to charting and his mean blood pressure over the past month has been 114/64 with a pulse of 67.  His most recent LFTs off amiodarone and atorvastatin were improved although still remain mildly elevated at ALT 72 and AST 53.  He does have some mild swelling in his lower extremities.  I have suggested support stockings of 20-30 Miller meters to below the knee.  If he continues to experience episodes of edema I have given a prescription for HCTZ 12.5 mg to take on an as-needed basis.  He has been probable fluid containing cyst anterior to his left patella.  He will follow-up with his primary MD I suspect this may need to be drained and may need orthopedic evaluation.  He will be  following up with Dylan York for his recent bladder infection and urinary incontinence.  Will be seeing Dr. Laural Golden back and repeat lab work will be obtained.  At that time I would suggest a follow-up chemistry profile in addition to liver function studies and if his creatinine is greater than 1.5 with his age he will need dose reduction of Eliquis.  I will see him in 6 months for cardiology evaluation.  Time spent: 25  minutes  Troy Sine, MD, [facility]  07/28/2018 3:06 PM

## 2018-07-29 DIAGNOSIS — Z6824 Body mass index (BMI) 24.0-24.9, adult: Secondary | ICD-10-CM | POA: Diagnosis not present

## 2018-07-29 DIAGNOSIS — Z299 Encounter for prophylactic measures, unspecified: Secondary | ICD-10-CM | POA: Diagnosis not present

## 2018-07-29 DIAGNOSIS — I10 Essential (primary) hypertension: Secondary | ICD-10-CM | POA: Diagnosis not present

## 2018-07-29 DIAGNOSIS — M7040 Prepatellar bursitis, unspecified knee: Secondary | ICD-10-CM | POA: Diagnosis not present

## 2018-07-29 DIAGNOSIS — M7042 Prepatellar bursitis, left knee: Secondary | ICD-10-CM | POA: Diagnosis not present

## 2018-07-29 DIAGNOSIS — N390 Urinary tract infection, site not specified: Secondary | ICD-10-CM | POA: Diagnosis not present

## 2018-07-29 NOTE — Addendum Note (Signed)
Addended by: Leland Johns A on: 07/29/2018 03:52 PM   Modules accepted: Orders

## 2018-08-03 ENCOUNTER — Ambulatory Visit (INDEPENDENT_AMBULATORY_CARE_PROVIDER_SITE_OTHER): Payer: Medicare Other | Admitting: Internal Medicine

## 2018-08-03 DIAGNOSIS — R748 Abnormal levels of other serum enzymes: Secondary | ICD-10-CM | POA: Diagnosis not present

## 2018-08-04 LAB — HEPATIC FUNCTION PANEL
ALK PHOS: 87 IU/L (ref 39–117)
ALT: 26 IU/L (ref 0–44)
AST: 32 IU/L (ref 0–40)
Albumin: 3.5 g/dL (ref 3.5–4.7)
BILIRUBIN TOTAL: 0.7 mg/dL (ref 0.0–1.2)
Bilirubin, Direct: 0.22 mg/dL (ref 0.00–0.40)
TOTAL PROTEIN: 5.6 g/dL — AB (ref 6.0–8.5)

## 2018-08-05 DIAGNOSIS — Z6824 Body mass index (BMI) 24.0-24.9, adult: Secondary | ICD-10-CM | POA: Diagnosis not present

## 2018-08-05 DIAGNOSIS — Z713 Dietary counseling and surveillance: Secondary | ICD-10-CM | POA: Diagnosis not present

## 2018-08-05 DIAGNOSIS — M7040 Prepatellar bursitis, unspecified knee: Secondary | ICD-10-CM | POA: Diagnosis not present

## 2018-08-05 DIAGNOSIS — I10 Essential (primary) hypertension: Secondary | ICD-10-CM | POA: Diagnosis not present

## 2018-08-05 DIAGNOSIS — Z299 Encounter for prophylactic measures, unspecified: Secondary | ICD-10-CM | POA: Diagnosis not present

## 2018-08-09 ENCOUNTER — Ambulatory Visit (INDEPENDENT_AMBULATORY_CARE_PROVIDER_SITE_OTHER): Payer: Medicare Other | Admitting: Internal Medicine

## 2018-08-09 ENCOUNTER — Encounter (INDEPENDENT_AMBULATORY_CARE_PROVIDER_SITE_OTHER): Payer: Self-pay | Admitting: Internal Medicine

## 2018-08-09 VITALS — BP 120/70 | HR 60 | Temp 98.2°F | Resp 18 | Ht 68.0 in | Wt 147.2 lb

## 2018-08-09 DIAGNOSIS — R748 Abnormal levels of other serum enzymes: Secondary | ICD-10-CM | POA: Diagnosis not present

## 2018-08-09 DIAGNOSIS — K5900 Constipation, unspecified: Secondary | ICD-10-CM

## 2018-08-09 DIAGNOSIS — R143 Flatulence: Secondary | ICD-10-CM | POA: Diagnosis not present

## 2018-08-09 DIAGNOSIS — I63412 Cerebral infarction due to embolism of left middle cerebral artery: Secondary | ICD-10-CM | POA: Diagnosis not present

## 2018-08-10 ENCOUNTER — Other Ambulatory Visit: Payer: Self-pay | Admitting: Cardiovascular Disease

## 2018-08-10 NOTE — Progress Notes (Signed)
Presenting complaint;  History of elevated transaminases. Follow-up for constipation and bloating.  Database and subjective:  Patient is 84 year old Caucasian male who is here for scheduled visit accompanied by his wife.  He was last seen about 3 months ago.  He has a history of small intestinal bacterial overgrowth constipation and he was also found to have elevated transaminases earlier this year.  This abnormality was felt to be due to amiodarone.  Both the Lipitor and amiodarone were discontinued after approval by Dr. Claiborne Billings.  Ultrasound did not show any changes of cirrhosis.  On his last visit he was also noted to have right inguinal hernia and I recommended elective surgery.  Patient states he had surgery in July this year.  Postop he had problems with urinary retention and required Foley's catheter for 1 week and then self cath for 3 days.  He states he was also treated for UTI during this.  But he feels he is fully recovered. He states he is doing well as for his bowels are concerned.  He may skip a day here and there but he generally has formed stool daily.  Similarly Phazyme has helped him a great deal and he is not using it on daily basis.  His appetite is fair and he has not lost any weight.  Current Medications: Outpatient Encounter Medications as of 08/09/2018  Medication Sig  . apixaban (ELIQUIS) 5 MG TABS tablet Take 1 tablet (5 mg total) by mouth 2 (two) times daily.  . COMBIGAN 0.2-0.5 % ophthalmic solution Place 1 drop into both eyes 2 (two) times daily.  Marland Kitchen docusate sodium (COLACE) 100 MG capsule Take 2 capsules (200 mg total) by mouth at bedtime.  Marland Kitchen doxazosin (CARDURA) 4 MG tablet Take 1 tablet (4 mg total) by mouth daily.  . finasteride (PROSCAR) 5 MG tablet Take 5 mg by mouth daily.  . hydrALAZINE (APRESOLINE) 50 MG tablet Take 1 tablet (50 mg total) by mouth every 8 (eight) hours. KEEP OV.  . levothyroxine (SYNTHROID, LEVOTHROID) 50 MCG tablet Take 50 mcg by mouth daily.  Marland Kitchen  LORazepam (ATIVAN) 1 MG tablet TAKE 1/2 TABLET OR 1 TABLET BY MOUTH AT BEDTIME AS NEEDED  . Multiple Vitamins-Minerals (MULTIVITAMIN PO) Take by mouth daily.  . Simethicone (PHAZYME) 180 MG CAPS Take by mouth daily.  Marland Kitchen spironolactone (ALDACTONE) 25 MG tablet Take 0.5 tablets (12.5 mg total) by mouth 2 (two) times daily. KEEP OV.  . [DISCONTINUED] hydrochlorothiazide (MICROZIDE) 12.5 MG capsule Take 1 capsule (12.5 mg total) by mouth as needed. (Patient not taking: Reported on 08/09/2018)   No facility-administered encounter medications on file as of 08/09/2018.      Objective: Blood pressure 120/70, pulse 60, temperature 98.2 F (36.8 C), temperature source Oral, resp. rate 18, height 5\' 8"  (1.727 m), weight 147 lb 3.2 oz (66.8 kg). Patient is alert and in no acute distress. Conjunctiva is pink. Sclera is nonicteric Oropharyngeal mucosa is normal. No neck masses or thyromegaly noted. Cardiac exam with regular rhythm normal S1 and S2. No murmur or gallop noted. Lungs are clear to auscultation. Abdomen is symmetrical.  He has multiple scars.  He has a reverse T shaped scar in mid abdomen vertical appendectomy scar in right lower quadrant as well as right inguinal scar. No LE edema or clubbing noted.  Labs/studies Results: Lab data from 08/03/2018  Bilirubin 0.7, AP 87, AST 32, ALT 26 and albumin 3.5.   Assessment:  #1.  History of elevated transaminases.  His transaminases are  normal.  Etiology felt to be amiodarone.  He can go back on statin if Dr. Claiborne Billings feels he needs this medication.  Similarly if amiodarone is considered to be vital medication for him it can be restarted at a low dose but I would leave that up to Dr. Claiborne Billings.  If he has to go back on any of these medications LFTs will need to be checked in 4 weeks.  #2.  Constipation.  He is doing well with dietary measures and as needed stool softener.  #3.  Flatulence.  He has a history of small intestinal bacterial overgrowth and  has been treated with antibiotics in the past.  He is presently doing well on PRN Phazyme.   Plan:  Patient will continue Colace and Phazyme on as-needed basis. He will let us know if he goes back on statin or amiodarone. Unless symptoms change he will return for office visit in 1 year.

## 2018-08-10 NOTE — Patient Instructions (Signed)
Patient will call if he has to go back on Lipitor or amiodarone.

## 2018-08-10 NOTE — Telephone Encounter (Signed)
Rx request sent to pharmacy.  

## 2018-08-19 DIAGNOSIS — M25562 Pain in left knee: Secondary | ICD-10-CM | POA: Diagnosis not present

## 2018-08-19 DIAGNOSIS — Z299 Encounter for prophylactic measures, unspecified: Secondary | ICD-10-CM | POA: Diagnosis not present

## 2018-09-23 ENCOUNTER — Other Ambulatory Visit: Payer: Self-pay | Admitting: Cardiovascular Disease

## 2018-10-18 DIAGNOSIS — Z961 Presence of intraocular lens: Secondary | ICD-10-CM | POA: Diagnosis not present

## 2018-10-18 DIAGNOSIS — H40001 Preglaucoma, unspecified, right eye: Secondary | ICD-10-CM | POA: Diagnosis not present

## 2018-10-18 DIAGNOSIS — H401121 Primary open-angle glaucoma, left eye, mild stage: Secondary | ICD-10-CM | POA: Diagnosis not present

## 2018-10-19 DIAGNOSIS — Z85828 Personal history of other malignant neoplasm of skin: Secondary | ICD-10-CM | POA: Diagnosis not present

## 2018-10-19 DIAGNOSIS — L570 Actinic keratosis: Secondary | ICD-10-CM | POA: Diagnosis not present

## 2018-10-19 DIAGNOSIS — D485 Neoplasm of uncertain behavior of skin: Secondary | ICD-10-CM | POA: Diagnosis not present

## 2018-10-19 DIAGNOSIS — L821 Other seborrheic keratosis: Secondary | ICD-10-CM | POA: Diagnosis not present

## 2018-10-19 DIAGNOSIS — C4339 Malignant melanoma of other parts of face: Secondary | ICD-10-CM | POA: Diagnosis not present

## 2018-10-19 DIAGNOSIS — L309 Dermatitis, unspecified: Secondary | ICD-10-CM | POA: Diagnosis not present

## 2018-10-21 ENCOUNTER — Other Ambulatory Visit: Payer: Self-pay | Admitting: Cardiovascular Disease

## 2018-10-29 DIAGNOSIS — Z713 Dietary counseling and surveillance: Secondary | ICD-10-CM | POA: Diagnosis not present

## 2018-10-29 DIAGNOSIS — Z1211 Encounter for screening for malignant neoplasm of colon: Secondary | ICD-10-CM | POA: Diagnosis not present

## 2018-10-29 DIAGNOSIS — Z1331 Encounter for screening for depression: Secondary | ICD-10-CM | POA: Diagnosis not present

## 2018-10-29 DIAGNOSIS — Z7189 Other specified counseling: Secondary | ICD-10-CM | POA: Diagnosis not present

## 2018-10-29 DIAGNOSIS — Z0000 Encounter for general adult medical examination without abnormal findings: Secondary | ICD-10-CM | POA: Diagnosis not present

## 2018-10-29 DIAGNOSIS — Z1339 Encounter for screening examination for other mental health and behavioral disorders: Secondary | ICD-10-CM | POA: Diagnosis not present

## 2018-10-29 DIAGNOSIS — I10 Essential (primary) hypertension: Secondary | ICD-10-CM | POA: Diagnosis not present

## 2018-10-29 DIAGNOSIS — Z6823 Body mass index (BMI) 23.0-23.9, adult: Secondary | ICD-10-CM | POA: Diagnosis not present

## 2018-11-01 DIAGNOSIS — C4339 Malignant melanoma of other parts of face: Secondary | ICD-10-CM | POA: Diagnosis not present

## 2018-11-01 DIAGNOSIS — D0339 Melanoma in situ of other parts of face: Secondary | ICD-10-CM | POA: Diagnosis not present

## 2018-11-01 DIAGNOSIS — Z85820 Personal history of malignant melanoma of skin: Secondary | ICD-10-CM | POA: Diagnosis not present

## 2018-11-01 DIAGNOSIS — Z85828 Personal history of other malignant neoplasm of skin: Secondary | ICD-10-CM | POA: Diagnosis not present

## 2018-11-01 DIAGNOSIS — L7682 Other postprocedural complications of skin and subcutaneous tissue: Secondary | ICD-10-CM | POA: Diagnosis not present

## 2018-11-02 DIAGNOSIS — C4339 Malignant melanoma of other parts of face: Secondary | ICD-10-CM | POA: Diagnosis not present

## 2018-11-08 ENCOUNTER — Telehealth: Payer: Self-pay | Admitting: Cardiovascular Disease

## 2018-11-08 NOTE — Telephone Encounter (Signed)
New message     Pt c/o BP issue: STAT if pt c/o blurred vision, one-sided weakness or slurred speeh   1. What are your last 5 BP readings? 84/60 pulse  94, 106/69 pulse 102, 79/55 pulse 107,  79/55 p 107 worn out tired    2. Are you having any other symptoms (ex. Dizziness, headache, blurred vision, passed out)? worn out tired   3. What is your BP issue? Pt stated that his blood pressure has been low for a couple of weeks.

## 2018-11-09 NOTE — Telephone Encounter (Signed)
LMTCB

## 2018-11-09 NOTE — Progress Notes (Signed)
Cardiology Office Note   Date:  11/10/2018   ID:  Dylan York, DOB [birth year], MRN [id number]  PCP:  Glenda Chroman, MD  Cardiologist:  Dr. Claiborne Billings  Chief Complaint  Patient presents with  . Atrial Fibrillation  . Hypertension     History of Present Illness: Dylan York is a 85 y.o. male who presents for ongoing assessment and management of atrial tachycardia, PAF, and difficult to control HTN. He is on Eliquis for anticoagulation and Bystolic for HR control. He did not tolerate Multaq. He was also started on amiodarone. He is on multiple antihypertensives.   Called our office on 11/09/2018 for complaints of rapid HR and labile BP which was staying low and HR over 110 bpm. He states that he has been having some symptoms of fatigue and weakness. His BP at home has been very low. 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 He felt dizziness and felt pressure in the back of his neck. He had to sit down to fell better.   He is no longer on Bystolic, he is not able to take amiodarone due to elevated LFT's. He has a dressing on his right temple from removal of melanoma. He denies bleeding or excessive bruising.   Past Medical History:  Diagnosis Date  . Atrial tachycardia ([place])   . BPH (benign prostatic hyperplasia)   . GERD (gastroesophageal reflux disease)   . Glaucoma   . Hypertension   . Pneumonia   . Sinus bradycardia, with HR in the 40s BB stopped. 08/08/2013  . Stroke ([place])   . Thyroid disease   . Tricuspid regurgitation     Past Surgical History:  Procedure Laterality Date  . APPENDECTOMY    . BACTERIAL OVERGROWTH TEST N/A 01/14/2016   Procedure: BACTERIAL OVERGROWTH TEST;  Surgeon: Rogene Houston, MD;  Location[facility];  Service: Endoscopy;  Laterality: N/A;  730  . HERNIA REPAIR    . PROSTATE SURGERY       Current Outpatient Medications  Medication Sig Dispense Refill  . apixaban  (ELIQUIS) 5 MG TABS tablet Take 1 tablet (5 mg total) by mouth 2 (two) times daily. 28 tablet 0  . COMBIGAN 0.2-0.5 % ophthalmic solution Place 1 drop into both eyes 2 (two) times daily.    Marland Kitchen docusate sodium (COLACE) 100 MG capsule Take 2 capsules (200 mg total) by mouth at bedtime. 60 capsule 0  . doxazosin (CARDURA) 4 MG tablet TAKE 1 TABLET BY MOUTH DAILY 30 tablet 3  . ELIQUIS 5 MG TABS tablet TAKE 1 TABLET BY MOUTH TWICE DAILY 180 tablet 1  . finasteride (PROSCAR) 5 MG tablet Take 5 mg by mouth daily.    . hydrALAZINE (APRESOLINE) 50 MG tablet Take 1 tablet (50 mg total) by mouth every 8 (eight) hours. KEEP OV. 270 tablet 0  . levothyroxine (SYNTHROID, LEVOTHROID) 75 MCG tablet     . LORazepam (ATIVAN) 1 MG tablet TAKE 1/2 TABLET OR 1 TABLET BY MOUTH AT BEDTIME AS NEEDED  2  . Multiple Vitamins-Minerals (MULTIVITAMIN PO) Take by mouth daily.    . Simethicone (PHAZYME) 180 MG CAPS Take by mouth daily.     No current facility-administered medications for this visit.     Allergies:   Multaq [dronedarone]    Social History:  The patient  reports that he quit smoking about 42 years ago. He has never used  smokeless tobacco. He reports that he does not drink alcohol or use drugs.   Family History:  The patient's family history includes Cancer in his mother; Cancer - Prostate in his paternal grandfather; Heart Problems in his father.    ROS: All other systems are reviewed and negative. Unless otherwise mentioned in H&P    PHYSICAL EXAM: VS:  BP 108/70   Pulse (!) 110   Ht 5\' 8"  (1.727 m)   Wt 148 lb (67.1 kg)   BMI 22.50 kg/m  , BMI Body mass index is 22.5 kg/m. GEN: Well nourished, well developed, in no acute distress HEENT: normal. Dressing over right temple. Neck: no JVD, carotid bruits, or masses Cardiac: IRRR; no murmurs, rubs, or gallops,no edema  Respiratory:  Clear to auscultation bilaterally, normal work of breathing GI: soft, nontender, nondistended, + BS MS: no  deformity or atrophy Skin: warm and dry, no rash Neuro:  Strength and sensation are intact Psych: euthymic mood, full affect   EKG:  Atrial fib/flutter rate of 99 bpm.  Recent Labs: 04/14/2018: Hemoglobin 11.2; Platelets 148 05/03/2018: BUN 21; Creatinine, Ser 1.42; Potassium 4.4; Sodium 136 08/03/2018: ALT 26    Lipid Panel    Component Value Date/Time   CHOL 122 03/09/2017 0831   TRIG 75 03/09/2017 0831   HDL 64 03/09/2017 0831   CHOLHDL 3.1 08-Oct-2016 0507   VLDL 18 October 08, 2016 0507   LDLCALC 43 03/09/2017 0831      Wt Readings from Last 3 Encounters:  11/10/18 148 lb (67.1 kg)  08/09/18 147 lb 3.2 oz (66.8 kg)  07/28/18 148 lb 9.6 oz (67.4 kg)      Other studies Reviewed: Echocardiogram 10/08/16  Left ventricle: The cavity size was normal. There was mild focal   basal hypertrophy of the septum. Systolic function was normal.   The estimated ejection fraction was in the range of 55% to 60%.   Wall motion was normal; there were no regional wall motion   abnormalities. Doppler parameters are consistent with abnormal   left ventricular relaxation (grade 1 diastolic dysfunction). - Aortic valve: Trileaflet; mildly thickened, mildly calcified   leaflets. There was mild regurgitation. - Mitral valve: Calcified annulus. - Left atrium: The atrium was moderately dilated. Volume/bsa, S:   40.4 ml/m^2. - Right ventricle: The cavity size was mildly dilated. Wall   thickness was normal. Systolic function was mildly reduced. - Right atrium: The atrium was moderately dilated. - Tricuspid valve: There was moderate regurgitation. - Pulmonary arteries: Systolic pressure was moderately increased.   PA peak pressure: 55 mm Hg (S).  ASSESSMENT AND PLAN:  1.  Atrial fib: He is not currently on any HR medications. Uncertain of dizziness is related to rapid HR or hypotension. Will BP low, there may be in accurate readings with A fib. However, he is symptomatic with lower BP.   I will  stop the spironolactone to allow for higher BP to avoid dizziness and weakness. I will check a BMET and CBC to evaluate his kidney status and check him for anemia on Eliquis.  If BP returns to more normal range, consider adding low dose metoprolol for better HR control. Will take this a step at a time.   2. Hypertension: I think he is on too much medication as he feels to weak and dizzy lately. Stopping spironolactone will be the first step. Can consider decreasing hydralazine dose next. Would like to start low dose BB for HR control as discussed above.   3. Thyroid  disease: Will check TSH with blood draw.    Current medicines are reviewed at length with the patient today.    Labs/ tests ordered today include: BMET. CBC, and TSH.  Phill Myron. West Pugh, ANP, AACC   11/10/2018 1:21 PM    [facility] [address] Office [phone] Fax [phone]

## 2018-11-09 NOTE — Telephone Encounter (Signed)
Spoke with pt. Pt sts that over the 2-3 weeks his BP has been running low and his HR has been elevated. Pt sts that his BP has ran as low as 79/55 and HR as high as 110bpm. Pt sts that when his BP is low and HR is elevated he feels wiped out and fatigued. He is taking all of his medications as prescribed. He denies chest pain, sob, palpitations. Pt sts that he is currently asymptomatic. His BP today 1116/82 108bpm. Pt has a hx of PAF and is concerned about his elevated HR. appt scheduled with Jory Sims, DNP for 11/10/18 @ 11:30am. Adv pt to call the office if his BP systolic drops below 90 or his HR is above 120bpm for further instruction prior to his o/v. Pt voiced appreciation for the assistance and verbalized understanding.

## 2018-11-10 ENCOUNTER — Ambulatory Visit (INDEPENDENT_AMBULATORY_CARE_PROVIDER_SITE_OTHER): Payer: Medicare Other | Admitting: Adult Health

## 2018-11-10 ENCOUNTER — Encounter: Payer: Self-pay | Admitting: Adult Health

## 2018-11-10 VITALS — BP 108/70 | HR 110 | Ht 68.0 in | Wt 148.0 lb

## 2018-11-10 DIAGNOSIS — Z79899 Other long term (current) drug therapy: Secondary | ICD-10-CM

## 2018-11-10 DIAGNOSIS — E039 Hypothyroidism, unspecified: Secondary | ICD-10-CM

## 2018-11-10 DIAGNOSIS — Z4802 Encounter for removal of sutures: Secondary | ICD-10-CM | POA: Diagnosis not present

## 2018-11-10 DIAGNOSIS — I480 Paroxysmal atrial fibrillation: Secondary | ICD-10-CM | POA: Diagnosis not present

## 2018-11-10 DIAGNOSIS — I10 Essential (primary) hypertension: Secondary | ICD-10-CM

## 2018-11-10 NOTE — Telephone Encounter (Signed)
NOTED  >KL NOTIFIED ALSO

## 2018-11-10 NOTE — Patient Instructions (Signed)
Medication Instructions:  STOP SPIRONOLACTONE  If you need a refill on your cardiac medications before your next appointment, please call your pharmacy.  Labwork: CMET,CBC AND TSH TODAY HERE IN OUR OFFICE AT [facility]    Take the provided lab slips with you to the lab for your blood draw.  When you have your labs (blood work) drawn today and your tests are completely normal, you will receive your results only by MyChart Message (if you have MyChart) -OR-  A paper copy in the mail.  If you have any lab test that is abnormal or we need to change your treatment, we will call you to review these results.  Follow-Up: You will need a follow up appointment in 2 weeks.  You may see Shelva Majestic, MD  Jory Sims, DNP, AACC  or one of the following Advanced Practice Providers on your designated Care Team:  Almyra Deforest, [place], PA-C    At [facility], you and your health needs are our priority.  As part of our continuing mission to provide you with exceptional heart care, we have created designated Provider Care Teams.  These Care Teams include your primary Cardiologist (physician) and Advanced Practice Providers (APPs -  Physician Assistants and Nurse Practitioners) who all work together to provide you with the care you need, when you need it.  Thank you for choosing [facility] at [facility]!!

## 2018-11-11 LAB — COMPREHENSIVE METABOLIC PANEL
ALBUMIN: 3.8 g/dL (ref 3.2–4.6)
ALK PHOS: 89 IU/L (ref 39–117)
ALT: 19 IU/L (ref 0–44)
AST: 29 IU/L (ref 0–40)
Albumin/Globulin Ratio: 1.9 (ref 1.2–2.2)
BUN / CREAT RATIO: 16 (ref 10–24)
BUN: 23 mg/dL (ref 10–36)
Bilirubin Total: 0.8 mg/dL (ref 0.0–1.2)
CHLORIDE: 103 mmol/L (ref 96–106)
CO2: 21 mmol/L (ref 20–29)
Calcium: 8.7 mg/dL (ref 8.6–10.2)
Creatinine, Ser: 1.43 mg/dL — ABNORMAL HIGH (ref 0.76–1.27)
GFR calc Af Amer: 49 mL/min/{1.73_m2} — ABNORMAL LOW (ref >59)
GFR calc non Af Amer: 43 mL/min/{1.73_m2} — ABNORMAL LOW (ref >59)
GLOBULIN, TOTAL: 2 g/dL (ref 1.5–4.5)
Glucose: 100 mg/dL — ABNORMAL HIGH (ref 65–99)
Potassium: 4.8 mmol/L (ref 3.5–5.2)
Sodium: 137 mmol/L (ref 134–144)
Total Protein: 5.8 g/dL — ABNORMAL LOW (ref 6.0–8.5)

## 2018-11-11 LAB — CBC
Hematocrit: 32.5 % — ABNORMAL LOW (ref 37.5–51.0)
Hemoglobin: 11.3 g/dL — ABNORMAL LOW (ref 13.0–17.7)
MCH: 33.4 pg — ABNORMAL HIGH (ref 26.6–33.0)
MCHC: 34.8 g/dL (ref 31.5–35.7)
MCV: 96 fL (ref 79–97)
PLATELETS: 158 10*3/uL (ref 150–450)
RBC: 3.38 x10E6/uL — ABNORMAL LOW (ref 4.14–5.80)
RDW: 12.6 % (ref 11.6–15.4)
WBC: 5.3 10*3/uL (ref 3.4–10.8)

## 2018-11-11 LAB — TSH: TSH: 2.9 u[IU]/mL (ref 0.450–4.500)

## 2018-11-15 ENCOUNTER — Telehealth: Payer: Self-pay

## 2018-11-15 NOTE — Telephone Encounter (Signed)
NOTED-PT WILL CB IF ANYTHING FURTHER NEEDED

## 2018-11-15 NOTE — Telephone Encounter (Signed)
Notes recorded by Lendon Colonel, NP on 11/11/2018 at 7:32 AM EST Labs are reviewed. His TSH is improved from last year. Still slightly anemic. Kidney function is stable. Continue to hold spironolactone and monitor BP response.  Pt called back, he states that BP is fine nut HR has still been running "a little high" 95-111 bpm. He states that he was "a little worried about HR" pt has scheduled appt 1-22 do we need to see him sooner? please advise

## 2018-11-15 NOTE — Telephone Encounter (Signed)
No, okay to wait until follow appointment date

## 2018-11-23 ENCOUNTER — Other Ambulatory Visit: Payer: Self-pay | Admitting: Cardiovascular Disease

## 2018-11-24 ENCOUNTER — Ambulatory Visit (INDEPENDENT_AMBULATORY_CARE_PROVIDER_SITE_OTHER): Payer: Medicare Other | Admitting: Adult Health

## 2018-11-24 ENCOUNTER — Encounter: Payer: Self-pay | Admitting: Adult Health

## 2018-11-24 VITALS — BP 143/87 | HR 85 | Ht 68.0 in | Wt 152.4 lb

## 2018-11-24 DIAGNOSIS — I10 Essential (primary) hypertension: Secondary | ICD-10-CM

## 2018-11-24 DIAGNOSIS — I4892 Unspecified atrial flutter: Secondary | ICD-10-CM

## 2018-11-24 MED ORDER — METOPROLOL SUCCINATE ER 25 MG PO TB24: 25.0000 mg | ORAL_TABLET | Freq: Every day | ORAL | 6 refills | Status: DC

## 2018-11-24 MED ORDER — HYDRALAZINE HCL 25 MG PO TABS: 25.0000 mg | ORAL_TABLET | Freq: Three times a day (TID) | ORAL | 6 refills | Status: DC

## 2018-11-24 NOTE — Patient Instructions (Signed)
Medication Instructions:  START METOPROLOL 25MG  DAILY  DECREASE HYDRALAZINE 25MG  THREE TIMES DAILY If you need a refill on your cardiac medications before your next appointment, please call your pharmacy.  Labwork: NONE ORDERED  When you have your labs (blood work) drawn today and your tests are completely normal, you will receive your results only by MyChart Message (if you have MyChart) -OR-  A paper copy in the mail.  If you have any lab test that is abnormal or we need to change your treatment, we will call you to review these results.  Follow-Up: You will need a follow up appointment in 1 months.  You may see Shelva Majestic, MD  Jory Sims, DNP, AACC  or one of the following Advanced Practice Providers on your designated Care Team:  Almyra Deforest, [place]  Fabian Sharp, PA-C   At [facility], you and your health needs are our priority.  As part of our continuing mission to provide you with exceptional heart care, we have created designated Provider Care Teams.  These Care Teams include your primary Cardiologist (physician) and Advanced Practice Providers (APPs -  Physician Assistants and Nurse Practitioners) who all work together to provide you with the care you need, when you need it.  Thank you for choosing [facility] at [facility]!!

## 2018-11-24 NOTE — Progress Notes (Signed)
Cardiology Office Note   Date:  11/24/2018   ID:  Dylan York, DOB 1926-07-09, MRN [id number]  PCP:  Glenda Chroman, MD  Cardiologist: Dr. Claiborne Billings Chief Complaint  Patient presents with  . Follow-up    2 WEEK MEDICATION FOLLOW UP     History of Present Illness: Dylan York is a 85 y.o. male who presents for ongoing assessment and management of atrial tachycardia, PAF and HTN. He did not tolerate Multaq or amiodarone. He is on multiple antihypertensives. He was last seen on 11/10/2018 after calling our office with complaints of rapid HR. 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 He felt dizziness and felt pressure in the back of his neck. He had to sit down to fell better.   He was found to be hypotensive on last office visit on 11/10/2018 and therefore, spironolactone was discontinued. Labs were checked to evaluate for anemia and dehydration. Consideration for starting low dose BB if HR and BP can tolerate.  Creatinine 1.43 on labs 11/10/2018, essentially unchanged from prior labs.  He comes today feeling much better, no further dizziness, but he has noticed his HR has remained elevated. He has kept very thorough records of his HR and BP along with averages over time. BP has increased by 10-12 points with discontinuation of spironolactone, but HR remains over 100 bpm.   Past Medical History:  Diagnosis Date  . Atrial tachycardia ([place])   . BPH (benign prostatic hyperplasia)   . GERD (gastroesophageal reflux disease)   . Glaucoma   . Hypertension   . Pneumonia   . Sinus bradycardia, with HR in the 40s BB stopped. 08/08/2013  . Stroke ([place])   . Thyroid disease   . Tricuspid regurgitation     Past Surgical History:  Procedure Laterality Date  . APPENDECTOMY    . BACTERIAL OVERGROWTH TEST N/A 01/14/2016   Procedure: BACTERIAL OVERGROWTH TEST;  Surgeon: Rogene Houston, MD;  Location: [facility];  Service:  Endoscopy;  Laterality: N/A;  730  . HERNIA REPAIR    . PROSTATE SURGERY       Current Outpatient Medications  Medication Sig Dispense Refill  . apixaban (ELIQUIS) 5 MG TABS tablet Take 1 tablet (5 mg total) by mouth 2 (two) times daily. 28 tablet 0  . COMBIGAN 0.2-0.5 % ophthalmic solution Place 1 drop into both eyes 2 (two) times daily.    Marland Kitchen docusate sodium (COLACE) 100 MG capsule Take 2 capsules (200 mg total) by mouth at bedtime. 60 capsule 0  . doxazosin (CARDURA) 4 MG tablet TAKE 1 TABLET BY MOUTH DAILY 30 tablet 3  . ELIQUIS 5 MG TABS tablet TAKE 1 TABLET BY MOUTH TWICE DAILY 180 tablet 1  . finasteride (PROSCAR) 5 MG tablet Take 5 mg by mouth daily.    . hydrALAZINE (APRESOLINE) 50 MG tablet TAKE 1 TABLET BY MOUTH EVERY 8 HOURS MUST KEEP OFFICE VISIT FOR FURTHER FILLS PER DR 270 tablet 3  . levothyroxine (SYNTHROID, LEVOTHROID) 75 MCG tablet     . LORazepam (ATIVAN) 1 MG tablet TAKE 1/2 TABLET OR 1 TABLET BY MOUTH AT BEDTIME AS NEEDED  2  . Multiple Vitamins-Minerals (MULTIVITAMIN PO) Take by mouth daily.    . Simethicone (PHAZYME) 180 MG CAPS Take by mouth daily.     No current facility-administered medications for this visit.     Allergies:   Amiodarone and  Multaq [dronedarone]    Social History:  The patient  reports that he quit smoking about 42 years ago. He has never used smokeless tobacco. He reports that he does not drink alcohol or use drugs.   Family History:  The patient's family history includes Cancer in his mother; Cancer - Prostate in his paternal grandfather; Heart Problems in his father.    ROS: All other systems are reviewed and negative. Unless otherwise mentioned in H&P    PHYSICAL EXAM: VS:  BP (!) 143/87 (BP Location: Left Arm, Patient Position: Sitting)   Pulse 85   Ht 5\' 8"  (1.727 m)   Wt 152 lb 6.4 oz (69.1 kg)   BMI 23.17 kg/m  , BMI Body mass index is 23.17 kg/m. GEN: Well nourished, well developed, in no acute distress HEENT:  normal Neck: no JVD, carotid bruits, or masses Cardiac: RRR; tachycardic. no murmurs, rubs, or gallops,no edema  Respiratory:  Clear to auscultation bilaterally, normal work of breathing GI: soft, nontender, nondistended, + BS MS: no deformity or atrophy Skin: warm and dry, no rash Neuro:  Strength and sensation are intact Psych: euthymic mood, full affect   EKG:  Atrial flutter rate of 109 bpm.  Recent Labs: 11/10/2018: ALT 19; BUN 23; Creatinine, Ser 1.43; Hemoglobin 11.3; Platelets 158; Potassium 4.8; Sodium 137; TSH 2.900    Lipid Panel    Component Value Date/Time   CHOL 122 03/09/2017 0831   TRIG 75 03/09/2017 0831   HDL 64 03/09/2017 0831   CHOLHDL 3.1 2016-09-29 0507   VLDL 18 29-Sep-2016 0507   LDLCALC 43 03/09/2017 0831      Wt Readings from Last 3 Encounters:  11/24/18 152 lb 6.4 oz (69.1 kg)  11/10/18 148 lb (67.1 kg)  08/09/18 147 lb 3.2 oz (66.8 kg)      Other studies Reviewed: Echocardiogram 2016/09/29 Left ventricle: The cavity size was normal. There was mild focal   basal hypertrophy of the septum. Systolic function was normal.   The estimated ejection fraction was in the range of 55% to 60%.   Wall motion was normal; there were no regional wall motion   abnormalities. Doppler parameters are consistent with abnormal   left ventricular relaxation (grade 1 diastolic dysfunction). - Aortic valve: Trileaflet; mildly thickened, mildly calcified   leaflets. There was mild regurgitation. - Mitral valve: Calcified annulus. - Left atrium: The atrium was moderately dilated. Volume/bsa, S:   40.4 ml/m^2. - Right ventricle: The cavity size was mildly dilated. Wall   thickness was normal. Systolic function was mildly reduced. - Right atrium: The atrium was moderately dilated. - Tricuspid valve: There was moderate regurgitation. - Pulmonary arteries: Systolic pressure was moderately increased.   PA peak pressure: 55 mm Hg (S).   ASSESSMENT AND PLAN:  1.  Hypotension: I will decrease hydralazine to 25 mg BID and add metoprolol XL 25 mg daily to his regimen for better HR control and hopefully allow for good balance of HR and BP. He will continue to keep records at home. He is much better symptomatically.   2. Atrial flutter: Remains on Eliquis. HR is not well controlled. Adding BB.  He is not anemic per labs and has no complaints of bleeding.   3. Chronic diastolic CHF: No evidence of volume overload.   Current medicines are reviewed at length with the patient today.    Labs/ tests ordered today include: None  Phill Myron. [place] Pugh, ANP, AACC   11/24/2018 1:52 PM  [address] Office [phone] Fax [phone]

## 2018-11-29 DIAGNOSIS — Z79899 Other long term (current) drug therapy: Secondary | ICD-10-CM | POA: Diagnosis not present

## 2018-11-29 DIAGNOSIS — E039 Hypothyroidism, unspecified: Secondary | ICD-10-CM | POA: Diagnosis not present

## 2018-11-29 DIAGNOSIS — Z125 Encounter for screening for malignant neoplasm of prostate: Secondary | ICD-10-CM | POA: Diagnosis not present

## 2018-11-29 DIAGNOSIS — R5383 Other fatigue: Secondary | ICD-10-CM | POA: Diagnosis not present

## 2018-12-29 ENCOUNTER — Encounter: Payer: Self-pay | Admitting: Adult Health

## 2018-12-29 ENCOUNTER — Ambulatory Visit (INDEPENDENT_AMBULATORY_CARE_PROVIDER_SITE_OTHER): Payer: Medicare Other | Admitting: Adult Health

## 2018-12-29 VITALS — BP 140/80 | HR 89 | Ht 68.0 in | Wt 148.0 lb

## 2018-12-29 DIAGNOSIS — N183 Chronic kidney disease, stage 3 unspecified: Secondary | ICD-10-CM

## 2018-12-29 DIAGNOSIS — I10 Essential (primary) hypertension: Secondary | ICD-10-CM | POA: Diagnosis not present

## 2018-12-29 DIAGNOSIS — I480 Paroxysmal atrial fibrillation: Secondary | ICD-10-CM | POA: Diagnosis not present

## 2018-12-29 MED ORDER — HYDRALAZINE HCL 25 MG PO TABS: 25.0000 mg | ORAL_TABLET | Freq: Two times a day (BID) | ORAL | 6 refills | Status: DC

## 2018-12-29 MED ORDER — METOPROLOL SUCCINATE ER 25 MG PO TB24: ORAL_TABLET | ORAL | 6 refills | Status: DC

## 2018-12-29 NOTE — Progress Notes (Signed)
Cardiology Office Note   Date:  12/29/2018   ID:  Dylan York, DOB [birth year], MRN [id number]  PCP:  Glenda Chroman, MD  Cardiologist: Dr. Claiborne Billings  Chief Complaint  Patient presents with  . Atrial Fibrillation  . Hypertension     History of Present Illness: Dylan York is a 85 y.o. male who presents for ongoing assessment and management of atrial tachycardia, PAF and HTN. He did not tolerate Multaq or amiodarone. He is on multiple antihypertensives.He has had medication titration due to symptoms of dizziness. His HR was elevated on his home readings, but BP was better after discontinuing spironolactone.   I decreased hydralazine to 25 mg BID and added metoprolol 25 mg daily to his regimen due to HR consistently around 100 bpm. He was to continue to keep records.   He has brought his detailed blood pressure records with him, including average BP and HR since March of 2019, along with detailed notations of when medications were changed. He has noticed his HR to be more irregular with associated DOE. His BP has been labile per his home BP monitor. He is medically compliant with the Eliquis.   Past Medical History:  Diagnosis Date  . Atrial tachycardia ([place])   . BPH (benign prostatic hyperplasia)   . GERD (gastroesophageal reflux disease)   . Glaucoma   . Hypertension   . Pneumonia   . Sinus bradycardia, with HR in the 40s BB stopped. 08/08/2013  . Stroke ([place])   . Thyroid disease   . Tricuspid regurgitation     Past Surgical History:  Procedure Laterality Date  . APPENDECTOMY    . BACTERIAL OVERGROWTH TEST N/A 01/14/2016   Procedure: BACTERIAL OVERGROWTH TEST;  Surgeon: Rogene Houston, MD;  Location: [facility];  Service: Endoscopy;  Laterality: N/A;  730  . HERNIA REPAIR    . PROSTATE SURGERY       Current Outpatient Medications  Medication Sig Dispense Refill  . apixaban (ELIQUIS) 5 MG TABS tablet Take 1 tablet (5 mg total) by mouth 2 (two) times daily. 28 tablet 0    . COMBIGAN 0.2-0.5 % ophthalmic solution Place 1 drop into both eyes 2 (two) times daily.    Marland Kitchen docusate sodium (COLACE) 100 MG capsule Take 2 capsules (200 mg total) by mouth at bedtime. 60 capsule 0  . doxazosin (CARDURA) 4 MG tablet TAKE 1 TABLET BY MOUTH DAILY 30 tablet 3  . finasteride (PROSCAR) 5 MG tablet Take 5 mg by mouth daily.    . hydrALAZINE (APRESOLINE) 25 MG tablet Take 1 tablet (25 mg total) by mouth 2 (two) times daily. 30 tablet 6  . levothyroxine (SYNTHROID, LEVOTHROID) 75 MCG tablet     . LORazepam (ATIVAN) 1 MG tablet TAKE 1/2 TABLET OR 1 TABLET BY MOUTH AT BEDTIME AS NEEDED  2  . metoprolol succinate (TOPROL-XL) 25 MG 24 hr tablet Take 1 tablet (25 mg total) by mouth every morning AND 0.5 tablets (12.5 mg total) every evening. Take with or immediately following a meal.. 45 tablet 6  . Multiple Vitamins-Minerals (MULTIVITAMIN PO) Take by mouth daily.    . Simethicone (PHAZYME) 180 MG CAPS Take by mouth daily.     No current facility-administered medications for this visit.     Allergies:   Amiodarone and Multaq [dronedarone]    Social History:  The patient  reports that he quit smoking about 42 years ago. He has never used smokeless tobacco. He reports that he does  not drink alcohol or use drugs.   Family History:  The patient's family history includes Cancer in his mother; Cancer - Prostate in his paternal grandfather; Heart Problems in his father.    ROS: All other systems are reviewed and negative. Unless otherwise mentioned in H&P    PHYSICAL EXAM: VS:  BP 140/80   Pulse 89   Ht 5\' 8"  (1.727 m)   Wt 148 lb (67.1 kg)   SpO2 96%   BMI 22.50 kg/m  , BMI Body mass index is 22.5 kg/m. GEN: Well nourished, well developed, in no acute distress HEENT: normal Neck: no JVD, carotid bruits, or masses Cardiac: IRRR; no murmurs, rubs, or gallops,no edema  Respiratory:  Clear to auscultation bilaterally, normal work of breathing GI: soft, nontender, nondistended, +  BS MS: no deformity or atrophy Skin: warm and dry, no rash Neuro:  Strength and sensation are intact Psych: euthymic mood, full affect   EKG:  Atrial fib rate of 87 bpm.   Recent Labs: 11/10/2018: ALT 19; BUN 23; Creatinine, Ser 1.43; Hemoglobin 11.3; Platelets 158; Potassium 4.8; Sodium 137; TSH 2.900    Lipid Panel    Component Value Date/Time   CHOL 122 03/09/2017 0831   TRIG 75 03/09/2017 0831   HDL 64 03/09/2017 0831   CHOLHDL 3.1 Oct 04, 2016 0507   VLDL 18 2016/10/04 0507   LDLCALC 43 03/09/2017 0831      Wt Readings from Last 3 Encounters:  12/29/18 148 lb (67.1 kg)  11/24/18 152 lb 6.4 oz (69.1 kg)  11/10/18 148 lb (67.1 kg)      Other studies Reviewed: Echocardiogram 10-04-16 Left ventricle: The cavity size was normal. There was mild focal basal hypertrophy of the septum. Systolic function was normal. The estimated ejection fraction was in the range of 55% to 60%. Wall motion was normal; there were no regional wall motion abnormalities. Doppler parameters are consistent with abnormal left ventricular relaxation (grade 1 diastolic dysfunction). - Aortic valve: Trileaflet; mildly thickened, mildly calcified leaflets. There was mild regurgitation. - Mitral valve: Calcified annulus. - Left atrium: The atrium was moderately dilated. Volume/bsa, S: 40.4 ml/m^2. - Right ventricle: The cavity size was mildly dilated. Wall thickness was normal. Systolic function was mildly reduced. - Right atrium: The atrium was moderately dilated. - Tricuspid valve: There was moderate regurgitation. - Pulmonary arteries: Systolic pressure was moderately increased. PA peak pressure: 55 mm Hg (S).  ASSESSMENT AND PLAN:  1. Atrial fib: Rate is controlled but he states that his breathing is worse with exertion sometimes, if his HR goes up. He checks his pulse frequently by radial palpation. He cannot take amiodarone due to elevated LFT's and was taken off of this by  Dr. Claiborne Billings one year ago. I will increase his metoprolol to 25 mg in the am and 12.5 mg in the pm. He is to see Dr. Claiborne Billings on next office visit, as he has not seen him for a year.   2. Hypertension: Labile BP is likely related to inaccuracy of BP machine in the setting of atrial fibrillation. I did recheck it in the exam room and found it to be 148/82. Increased dose of the metoprolol to 25mg  /12.5 mg may assist with this. He is on hydralazine 25 mg TID, but often forgets to take the middle of the day dose. He feels dizzy sometimes. I am not sure if it is from atrial fib or from lower BP. Will decrease the hydralazine to 12. 5 mg BID to see  if his dizziness improves, and go up on the dose of metoprolol as above.   3. Chronic Renal insufficiency: He brings with him a copy of his latest labs. Creatinine 1.30.   4. Elevated LFT's: Review of his labs today reveal that AST 30, ALT 18, Bili 0.9. Normalization of LFT;s with discontinuation of amiodarone.  Current medicines are reviewed at length with the patient today.    Labs/ tests ordered today include: None   Phill Myron. West Pugh, ANP, AACC   12/29/2018 4:42 PM    [place] [address] [place] [address] Office [phone] Fax [phone]

## 2018-12-29 NOTE — Patient Instructions (Signed)
Medication Instructions:  INCREASE METOPROLOL 25MG -AM AND 12.5MG (1/2 TAB) IN THE PM DECREASE HYDRALAZINE 12.5MG  TWICE DAILY If you need a refill on your cardiac medications before your next appointment, please call your pharmacy.  Follow-Up: You will need a follow up appointment in 6 weeks+ WITH  Shelva Majestic, MD ONLY or one of the following Advanced Practice Providers on your designated Care Team:  Almyra Deforest, [place]  Fabian Sharp, PA-C   At [facility], you and your health needs are our priority.  As part of our continuing mission to provide you with exceptional heart care, we have created designated Provider Care Teams.  These Care Teams include your primary Cardiologist (physician) and Advanced Practice Providers (APPs -  Physician Assistants and Nurse Practitioners) who all work together to provide you with the care you need, when you need it.  Thank you for choosing [facility] at [facility]!!

## 2019-01-06 ENCOUNTER — Telehealth: Payer: Self-pay | Admitting: Adult Health

## 2019-01-06 NOTE — Telephone Encounter (Signed)
Called, verified with pharmacy that the correct dose of the hydralazine was 25, but the last OV note states 12.5 BID. I did advise it was 12.5 BID per last OV note. Pharmacy had no questions at this time.

## 2019-01-06 NOTE — Telephone Encounter (Signed)
Please give Barnett Applebaum at [facility] a call [phone] concerning pt's hydrALAZINE (APRESOLINE) 25 MG tablet [[id number]]

## 2019-01-06 NOTE — Telephone Encounter (Signed)
Please give Barnett Applebaum at [facility] a call [phone] concerning pt's hydrALAZINE (APRESOLINE) 25 MG tablet [[id number]]

## 2019-01-07 ENCOUNTER — Telehealth: Payer: Self-pay | Admitting: Adult Health

## 2019-01-07 MED ORDER — HYDRALAZINE HCL 25 MG PO TABS: 12.5000 mg | ORAL_TABLET | Freq: Two times a day (BID) | ORAL | 6 refills | Status: DC

## 2019-01-07 NOTE — Telephone Encounter (Signed)
New Message:     Pt says he needs the correct directions on how is supposed to be taking his Hydralazine please.

## 2019-01-07 NOTE — Telephone Encounter (Signed)
INCREASE METOPROLOL 25MG -AM AND 12.5MG (1/2 TAB) IN THE PM DECREASE HYDRALAZINE 12.5MG  TWICE DAILY  Pt notified. He will call pharmacy for rx fill. I have sent a new rx

## 2019-01-26 ENCOUNTER — Ambulatory Visit: Payer: Medicare Other | Admitting: Adult Health

## 2019-02-15 ENCOUNTER — Telehealth: Payer: Self-pay | Admitting: Cardiovascular Disease

## 2019-02-15 NOTE — Telephone Encounter (Signed)
No smartphone/no my chart/consent obtained/pre reg complete/dc/04.14.20

## 2019-02-16 ENCOUNTER — Telehealth (INDEPENDENT_AMBULATORY_CARE_PROVIDER_SITE_OTHER): Payer: Medicare Other | Admitting: Cardiovascular Disease

## 2019-02-16 ENCOUNTER — Encounter: Payer: Self-pay | Admitting: Cardiovascular Disease

## 2019-02-16 VITALS — BP 137/88 | HR 91 | Ht 68.0 in | Wt 151.0 lb

## 2019-02-16 DIAGNOSIS — I480 Paroxysmal atrial fibrillation: Secondary | ICD-10-CM

## 2019-02-16 DIAGNOSIS — Z7901 Long term (current) use of anticoagulants: Secondary | ICD-10-CM

## 2019-02-16 DIAGNOSIS — R945 Abnormal results of liver function studies: Secondary | ICD-10-CM

## 2019-02-16 DIAGNOSIS — N183 Chronic kidney disease, stage 3 unspecified: Secondary | ICD-10-CM

## 2019-02-16 DIAGNOSIS — I10 Essential (primary) hypertension: Secondary | ICD-10-CM

## 2019-02-16 DIAGNOSIS — I495 Sick sinus syndrome: Secondary | ICD-10-CM

## 2019-02-16 DIAGNOSIS — E039 Hypothyroidism, unspecified: Secondary | ICD-10-CM

## 2019-02-16 DIAGNOSIS — R7989 Other specified abnormal findings of blood chemistry: Secondary | ICD-10-CM

## 2019-02-16 MED ORDER — METOPROLOL SUCCINATE ER 25 MG PO TB24: 25.0000 mg | ORAL_TABLET | Freq: Two times a day (BID) | ORAL | 1 refills | Status: DC

## 2019-02-16 NOTE — Patient Instructions (Signed)
Medication Instructions:  Increase Metoprolol to 25 mg twice daily. If you notice that your BP is dropping low, you may cut the Cardura in half (2 mg) If you need a refill on your cardiac medications before your next appointment, please call your pharmacy.    Follow-Up: At [facility], you and your health needs are our priority.  As part of our continuing mission to provide you with exceptional heart care, we have created designated Provider Care Teams.  These Care Teams include your primary Cardiologist (physician) and Advanced Practice Providers (APPs -  Physician Assistants and Nurse Practitioners) who all work together to provide you with the care you need, when you need it. You will need a follow up appointment in 2 months.  Please call our office 2 months in advance to schedule this appointment.  You may see Shelva Majestic, MD or one of the following Advanced Practice Providers on your designated Care Team: [place], [place] . Fabian Sharp, PA-C

## 2019-02-16 NOTE — Progress Notes (Signed)
Virtual Visit via Telephone Note   This visit type was conducted due to national recommendations for restrictions regarding the COVID-19 Pandemic (e.g. social distancing) in an effort to limit this patient's exposure and mitigate transmission in our community.  Due to his co-morbid illnesses, this patient is at least at moderate risk for complications without adequate follow up.  This format is felt to be most appropriate for this patient at this time.  The patient did not have access to video technology/had technical difficulties with video requiring transitioning to audio format only (telephone).  All issues noted in this document were discussed and addressed.  No physical exam could be performed with this format.  Please refer to the patient's chart for his  consent to telehealth for [facility].   Evaluation Performed:  Follow-up visit  Date:  02/16/2019   ID:  Dylan York, DOB 1926-07-04, MRN [id number]  Patient Location: Home Provider Location: Home  PCP:  Dylan Chroman, MD  Cardiologist:  Dylan Majestic, MD  Electrophysiologist:  None   Chief Complaint: Follow-up office visit of atrial fibrillation and hypertension  History of Present Illness:    Dylan York is a 85 y.o. male who has a history of sick sinus syndrome with atrial tachycardia as well as history of sinus bradycardia, ventricular bigeminy PVCs PAF and difficult to control hypertension.He is also followed by Dr. Lawerance York for urologic issues particular regarding his documented large renal cysts.  He has had difficulty over the years with significant blood pressure lability.  He has been exceedingly comprehensive with frequent blood pressure recordings between each office visit and statistical analysis of his data.  He has had issues with atrial fibrillation as well as atrial flutter.  I last saw him in September 2019. At that time, he was on spironolactone 12.5 mg twice a day, hydralazine 50 mg every 8 hours,  doxazosin 4 mg daily.  He was having some leg swelling and I suggested support stockings.  Remotely he had developed LFT elevation and was taken off amiodarone and atorvastatin.  During that evaluation he was in sinus rhythm with sinus bradycardia at 54 bpm.  We had a long discussion today any recalled all his blood pressure data since his last evaluation.  Apparently, several months after amiodarone was discontinued in [place] he felt his heart rate increasing.  Apparently he was not evaluated until he saw Dylan York on November 10, 2018.  At that time he was in atrial fibrillation.  His pulse was 106.  Apparently he was feeling weak and dizzy.  Spironolactone was discontinued.  At follow-up evaluation on November 24, 2018 he was still in A. fib with increased rate.  He was started on metoprolol at that time and hydralazine dose was reduced.  He was last seen by Dylan York on December 29, 2018 and his blood pressure was low 103/69 with a pulse of 93.  Hydralazine was further decreased to 12.5 mg twice a day and metoprolol was increased to 25 mg in the morning and 12.5 mg at night.\  Over the past month, he feels that he is doing better and his blood pressure typically runs in the 10 8-1 20 range.  However over the last week his blood pressure has been running in the 643 systolically.  He denies chest pain PND orthopnea.  He does have some mild renal insufficiency with his last creatinine at 1.43.  He believes he is sleeping well.  He denies chest pain.  His pulse is still a regular.  He presents for evaluation.  The patient does not have symptoms concerning for COVID-19 infection (fever, chills, cough, or new shortness of breath).    Past Medical History:  Diagnosis Date   Atrial tachycardia ([facility])    BPH (benign prostatic hyperplasia)    GERD (gastroesophageal reflux disease)    Glaucoma    Hypertension    Pneumonia    Sinus bradycardia, with HR in the 40s BB stopped.  08/08/2013   Stroke ([place])    Thyroid disease    Tricuspid regurgitation    Past Surgical History:  Procedure Laterality Date   APPENDECTOMY     BACTERIAL OVERGROWTH TEST N/A 01/14/2016   Procedure: BACTERIAL OVERGROWTH TEST;  Surgeon: Rogene Houston, MD;  Location[facility];  Service: Endoscopy;  Laterality: N/A;  730   HERNIA REPAIR     PROSTATE SURGERY       Current Meds  Medication Sig   apixaban (ELIQUIS) 5 MG TABS tablet Take 1 tablet (5 mg total) by mouth 2 (two) times daily.   COMBIGAN 0.2-0.5 % ophthalmic solution Place 1 drop into both eyes 2 (two) times daily.   docusate sodium (COLACE) 100 MG capsule Take 2 capsules (200 mg total) by mouth at bedtime.   doxazosin (CARDURA) 4 MG tablet TAKE 1 TABLET BY MOUTH DAILY   finasteride (PROSCAR) 5 MG tablet Take 5 mg by mouth daily.   hydrALAZINE (APRESOLINE) 25 MG tablet Take 0.5 tablets (12.5 mg total) by mouth 2 (two) times daily.   levothyroxine (SYNTHROID, LEVOTHROID) 75 MCG tablet    LORazepam (ATIVAN) 1 MG tablet TAKE 1/2 TABLET OR 1 TABLET BY MOUTH AT BEDTIME AS NEEDED   metoprolol succinate (TOPROL-XL) 25 MG 24 hr tablet Take 1 tablet (25 mg total) by mouth every morning AND 0.5 tablets (12.5 mg total) every evening. Take with or immediately following a meal..   Multiple Vitamins-Minerals (MULTIVITAMIN PO) Take by mouth daily.   Simethicone (PHAZYME) 180 MG CAPS Take by mouth daily.     Allergies:   Amiodarone and Multaq [dronedarone]   Social History   Tobacco Use   Smoking status: Former Smoker    Last attempt to quit: 11/03/1976    Years since quitting: 42.3   Smokeless tobacco: Never Used  Substance Use Topics   Alcohol use: No    Alcohol/week: 0.0 standard drinks   Drug use: No     Family Hx: The patient's family history includes Cancer in his mother; Cancer - Prostate in his paternal grandfather; Heart Problems in his father.  ROS:   Please see the history of present illness.      Positive for atrial fibrillation/atrial flutter.  Occasional episodes of dizziness if blood pressure is low or heart rate too slow.  History of kidney insufficiency.  History of bilateral renal cysts.  LFT elevation on amiodarone/atorvastatin. Followed by Dr. Lawerance York also for prostate issues on finasteride. History of hypothyroidism. All other systems reviewed and are negative.   Prior CV studies:   The following studies were reviewed today:  Echocardiogram 09/08/2016 Left ventricle: The cavity size was normal. There was mild focal basal hypertrophy of the septum. Systolic function was normal. The estimated ejection fraction was in the range of 55% to 60%. Wall motion was normal; there were no regional wall motion abnormalities. Doppler parameters are consistent with abnormal left ventricular relaxation (grade 1 diastolic dysfunction). - Aortic valve: Trileaflet; mildly thickened, mildly calcified leaflets. There was  mild regurgitation. - Mitral valve: Calcified annulus. - Left atrium: The atrium was moderately dilated. Volume/bsa, S: 40.4 ml/m^2. - Right ventricle: The cavity size was mildly dilated. Wall thickness was normal. Systolic function was mildly reduced. - Right atrium: The atrium was moderately dilated. - Tricuspid valve: There was moderate regurgitation. - Pulmonary arteries: Systolic pressure was moderately increased. PA peak pressure: 55 mm Hg (S).   Labs/Other Tests and Data Reviewed:    EKG: I personally reviewed the ECG from December 29, 2018 which revealed atrial fibrillation at 87 bpm.  QTc interval 471 ms.  Recent Labs: 11/10/2018: ALT 19; BUN 23; Creatinine, Ser 1.43; Hemoglobin 11.3; Platelets 158; Potassium 4.8; Sodium 137; TSH 2.900   Recent Lipid Panel Lab Results  Component Value Date/Time   CHOL 122 03/09/2017 08:31 AM   TRIG 75 03/09/2017 08:31 AM   HDL 64 03/09/2017 08:31 AM   CHOLHDL 3.1 09/08/2016 05:07 AM   LDLCALC 43  03/09/2017 08:31 AM    Wt Readings from Last 3 Encounters:  02/16/19 151 lb (68.5 kg)  12/29/18 148 lb (67.1 kg)  11/24/18 152 lb 6.4 oz (69.1 kg)     Objective:    Vital Signs:  BP 137/88    Pulse 91    Ht 5\' 8"  (1.727 m)    Wt 151 lb (68.5 kg)    BMI 22.96 kg/m    He states his blood pressure yesterday was 133/91  Well nourished, well developed male in no acute distress. Breathing is not labored.  Normal respirations. No audible wheezing.  He denies shortness of breath. There is no chest pain to palpation of his chest by himself.  He denies abdominal tenderness to palpation.  He denies recent swelling of his legs. He denies any neurologic issues.  He has full range of motion. He has normal cognition and affect  ASSESSMENT & PLAN:    1. Atrial fibrillation: Dylan York has a history of sinus node dysfunction and prior history of atrial fibrillation as well as atrial flutter.  He had been maintaining sinus rhythm but unfortunately developed significant LFT elevation necessitating discontinuance of amiodarone.  It appears that he may have reverted back to atrial fibrillation approximately 6 weeks later when metoprolol had completely disappeared from his system.  By clinical history it appears he has been in recurrent atrial fibrillation since late October early November.  His rate is slightly improved from previous evaluations but is still in the 90s.  Presently, I am recommending further titration of metoprolol to 25 mg twice a day.  This will also help his blood pressure.  He continues to be on Eliquis for anticoagulation.  Most recent creatinine was 1.43. 2. Hypertension: Presently he is on a reduced dose of hydralazine at 12.5 mg twice a day and I will increase metoprolol to 25 mg twice a day.  He continues to be on doxazosin.  If his blood pressure becomes too low with the increased beta-blocker regimen may be worthwhile to reduce doxazosin to 2 mg daily. 3. Anticoagulation:  Currently on Eliquis without signs of bleeding. 4. Mild renal insufficiency: Most recent creatinine 1.43 5. History of LFT elevation while on amiodarone/atorvastatin.  Most recent LFTs normal 6. Bilateral renal cysts and prostate issues: Followed by Dr. Lawerance York  COVID-19 Education: The signs and symptoms of COVID-19 were discussed with the patient and how to seek care for testing (follow up with PCP or arrange E-visit).  The importance of social distancing was discussed today.  Time:   Today, I have spent 30 minutes with the patient with telehealth technology discussing the above problems.     Medication Adjustments/Labs and Tests Ordered: Current medicines are reviewed at length with the patient today.  Concerns regarding medicines are outlined above.   Tests Ordered: No orders of the defined types were placed in this encounter.   Medication Changes: No orders of the defined types were placed in this encounter.   Disposition:  Follow up 2 months  Signed, Dylan Majestic, MD  02/16/2019 4:35 PM    [place]

## 2019-02-17 ENCOUNTER — Other Ambulatory Visit: Payer: Self-pay | Admitting: Cardiovascular Disease

## 2019-02-17 NOTE — Telephone Encounter (Signed)
Eliquis refilled.  

## 2019-03-21 ENCOUNTER — Other Ambulatory Visit: Payer: Self-pay | Admitting: Cardiovascular Disease

## 2019-04-19 ENCOUNTER — Telehealth: Payer: Self-pay | Admitting: Adult Health

## 2019-04-19 NOTE — Telephone Encounter (Signed)
New Message:     Pt would like an in office visit with Dr Claiborne Billings or Arlyss Queen please. He says he in and out of Afib, blood pressure up sometimes and low sometimes.He does not want a Virtual Visiit.

## 2019-04-19 NOTE — Telephone Encounter (Signed)
Called patient- advised I did think he should be seen by APP in office- K.Lawerence did not have any spots for tomorrow, but did get him in to see Doreene Adas, PA on Monday 06/22 at 3:15. Patient will continue to monitor BP and bring log to visit.  I will route to assistant to have for Angie for this visit.  Patient advised to come alone, wear mask.  Covid questions asked- no to traveling, fever, exposure.

## 2019-04-19 NOTE — Telephone Encounter (Signed)
Pt called to make appt "in office" with Dr. Claiborne Billings.. he was told he needed a 2 month OV after his 02/2019 Televisit.Marland Kitchen   He has not had much improvement since increasing his Metoprolol to 25mg  bid.... his BP has been up and down... he feels he is still constantly in Afib. His BP has been 80/50 up to 140/95.Marland Kitchen   He feels washed out and very fatigued.. he says he is just not happy with how he has been feeling.   He is asking to be seen physically... I talked with him about the COVID concerns but he says he is not worried.  He is asking to possibly be seen by Dr. Claiborne Billings or Jory Sims NP.   I will forward to their nurses to determine appt type based on their providers preference and when he can be seen.

## 2019-04-22 DIAGNOSIS — Z6825 Body mass index (BMI) 25.0-25.9, adult: Secondary | ICD-10-CM | POA: Diagnosis not present

## 2019-04-22 DIAGNOSIS — I480 Paroxysmal atrial fibrillation: Secondary | ICD-10-CM | POA: Diagnosis not present

## 2019-04-22 DIAGNOSIS — I10 Essential (primary) hypertension: Secondary | ICD-10-CM | POA: Diagnosis not present

## 2019-04-22 DIAGNOSIS — E039 Hypothyroidism, unspecified: Secondary | ICD-10-CM | POA: Diagnosis not present

## 2019-04-22 DIAGNOSIS — Z299 Encounter for prophylactic measures, unspecified: Secondary | ICD-10-CM | POA: Diagnosis not present

## 2019-04-25 ENCOUNTER — Ambulatory Visit: Payer: Medicare Other | Admitting: Physician Assistant

## 2019-04-25 NOTE — Telephone Encounter (Signed)
Follow up     Pt is calling and said he did not want to be seen in the office because his wife could not come with him. He said he went to have an EKG done and now would like a virtual appt  He says the office told him they would fax over the EKG on Friday  He says he is in AFIB    Please call

## 2019-04-25 NOTE — Telephone Encounter (Signed)
Try to arrange an in office evaluation this week if at all possible

## 2019-04-25 NOTE — Telephone Encounter (Signed)
Spoke with pt, he reports being in atrial fib and aware we do not have the EKG. He reports a bp of 76/56 currently and a pulse of 102 bpm. He feels exhausted and having trouble holding his head up. He is currentyy taking cardura 2 mg and metoprolol twice daily. For may his average bp was 106/72 with high of 120/74 and low 76/50. June average bp was 113/80, with high 144/97 and low 83/57. His heart rate ranges from 111 to 76 bpm with 95 average. He denies any chest pain or SOB. His wife is in the home with him. He was encouraged to eat salt and drink some water. He reports having this problem in the past and did not want a virtual visit today. Will forward to dr Claiborne Billings to review and advise

## 2019-04-25 NOTE — Telephone Encounter (Signed)
Called patient, he was advised to come into the office to be seen.  Appointment made for Dylan Sims, NP on Wednesday at 11:00.  Will route to NP and nurse to make aware of issues.   Wife needs to stay in the hallway in a chair- as he does not want to leave her alone at the house or in the car, and not allowed in the office.  They were aware of this, and were told she could use the hallway- and advised to wear masks, and no to all covid screening.

## 2019-04-27 ENCOUNTER — Encounter: Payer: Self-pay | Admitting: Adult Health

## 2019-04-27 ENCOUNTER — Other Ambulatory Visit: Payer: Self-pay

## 2019-04-27 ENCOUNTER — Ambulatory Visit (INDEPENDENT_AMBULATORY_CARE_PROVIDER_SITE_OTHER): Payer: Medicare Other | Admitting: Adult Health

## 2019-04-27 ENCOUNTER — Telehealth: Payer: Self-pay | Admitting: *Deleted

## 2019-04-27 VITALS — BP 164/106 | HR 92 | Ht 68.0 in | Wt 155.0 lb

## 2019-04-27 DIAGNOSIS — R0989 Other specified symptoms and signs involving the circulatory and respiratory systems: Secondary | ICD-10-CM

## 2019-04-27 DIAGNOSIS — I480 Paroxysmal atrial fibrillation: Secondary | ICD-10-CM | POA: Diagnosis not present

## 2019-04-27 DIAGNOSIS — I10 Essential (primary) hypertension: Secondary | ICD-10-CM | POA: Diagnosis not present

## 2019-04-27 NOTE — Progress Notes (Signed)
Cardiology Office Note   Date:  04/27/2019   ID:  PSALM SCHAPPELL, DOB 10/31/1926, MRN [id number]  PCP:  Glenda Chroman, MD  Cardiologist:  Claiborne Billings  CC: Hypertension   History of Present Illness: Dylan York is a 85 y.o. male who presents for ongoing assessment and management of SSS, atrial fibrillation, with medication changes for rate and BP control, He was last seen by Dr. Claiborne Billings on 02/16/2019 and metoprolol was increased to to 25 mg BID for better HR control, He was continued on Eliquis. Hydralazine was reduced to 12.5 mg BID.. May need to reduce doxazosin to 2 mg daily.    Dylan York comes today with complaints of labile blood pressure and heart rate.  He has been taking his blood pressure at home on his home machine is had blood pressures as low as 104/60 and as high as 142/88.  The patient due to low blood pressures has decreased himself to doxazosin 2 mg daily over the last month.  Blood pressure has been ranging in the 120s over 60s at home with the exception of 1 day when his blood pressure was 144/82.  He brings with him a lot of charts and graphs which he has color-coded to evaluate his heart rate and blood pressure.  Heart rate has been fairly consistent in the 80s and 90s with one episode greater than 100.  These are all from his blood pressure cuff machine.  As he is in atrial fibrillation it may be difficult for accuracy on blood pressure machine to give him correct readings.  He denies dizziness or headaches, he does state that his neck feels tight and he feels tired sometimes when his blood pressure is too low.  He denies feeling his heart rate becoming very rapid but he is basing it on the blood pressure machine which he uses once a day and plots his pressures.  Past Medical History:  Diagnosis Date  . Atrial tachycardia ([place])   . BPH (benign prostatic hyperplasia)   . GERD (gastroesophageal reflux disease)   . Glaucoma   . Hypertension   . Pneumonia   . Sinus bradycardia, with  HR in the 40s BB stopped. 08/08/2013  . Stroke ([place])   . Thyroid disease   . Tricuspid regurgitation     Past Surgical History:  Procedure Laterality Date  . APPENDECTOMY    . BACTERIAL OVERGROWTH TEST N/A 01/14/2016   Procedure: BACTERIAL OVERGROWTH TEST;  Surgeon: Rogene Houston, MD;  Location[facility];  Service: Endoscopy;  Laterality: N/A;  730  . HERNIA REPAIR    . PROSTATE SURGERY       Current Outpatient Medications  Medication Sig Dispense Refill  . COMBIGAN 0.2-0.5 % ophthalmic solution Place 1 drop into both eyes 2 (two) times daily.    Marland Kitchen docusate sodium (COLACE) 100 MG capsule Take 2 capsules (200 mg total) by mouth at bedtime. 60 capsule 0  . doxazosin (CARDURA) 4 MG tablet TAKE 1 TABLET BY MOUTH DAILY 30 tablet 3  . ELIQUIS 5 MG TABS tablet TAKE 1 TABLET BY MOUTH TWICE DAILY 180 tablet 1  . finasteride (PROSCAR) 5 MG tablet Take 5 mg by mouth daily.    . hydrALAZINE (APRESOLINE) 25 MG tablet Take 0.5 tablets (12.5 mg total) by mouth 2 (two) times daily. 30 tablet 6  . levothyroxine (SYNTHROID, LEVOTHROID) 75 MCG tablet     . LORazepam (ATIVAN) 1 MG tablet TAKE 1/2 TABLET OR 1 TABLET BY MOUTH  AT BEDTIME AS NEEDED  2  . metoprolol succinate (TOPROL-XL) 25 MG 24 hr tablet Take 1 tablet (25 mg total) by mouth 2 (two) times daily. Take with or immediately following a meal. 180 tablet 1  . Multiple Vitamins-Minerals (MULTIVITAMIN PO) Take by mouth daily.    . Simethicone (PHAZYME) 180 MG CAPS Take by mouth daily.     No current facility-administered medications for this visit.     Allergies:   Amiodarone and Multaq [dronedarone]    Social History:  The patient  reports that he quit smoking about 42 years ago. He has never used smokeless tobacco. He reports that he does not drink alcohol or use drugs.   Family History:  The patient's family history includes Cancer in his mother; Cancer - Prostate in his paternal grandfather; Heart Problems in his father.    ROS:  All other systems are reviewed and negative. Unless otherwise mentioned in H&P    PHYSICAL EXAM: VS:  BP (!) 164/106   Pulse 92   Ht 5\' 8"  (1.727 m)   Wt 155 lb (70.3 kg)   BMI 23.57 kg/m  , BMI Body mass index is 23.57 kg/m. GEN: Well nourished, well developed, in no acute distress HEENT: normal Neck: no JVD, carotid bruits, or masses Cardiac: IRRR; no murmurs, rubs, or gallops,no edema  Respiratory:  Clear to auscultation bilaterally, normal work of breathing GI: soft, nontender, nondistended, + BS MS: no deformity or atrophy Skin: warm and dry, no rash Neuro:  Strength and sensation are intact Psych: euthymic mood, full affect   EKG:  \Atrial fibrillation rate of 92 bpm with nonspecific ST abnormalities noted inferior laterally.  QT interval is slightly prolonged at 374 ms.  QTc 462 ms.  Recent Labs: 11/10/2018: ALT 19; BUN 23; Creatinine, Ser 1.43; Hemoglobin 11.3; Platelets 158; Potassium 4.8; Sodium 137; TSH 2.900    Lipid Panel    Component Value Date/Time   CHOL 122 03/09/2017 0831   TRIG 75 03/09/2017 0831   HDL 64 03/09/2017 0831   CHOLHDL 3.1 09-10-2016 0507   VLDL 18 10-Sep-2016 0507   LDLCALC 43 03/09/2017 0831      Wt Readings from Last 3 Encounters:  04/27/19 155 lb (70.3 kg)  02/16/19 151 lb (68.5 kg)  12/29/18 148 lb (67.1 kg)      Other studies Reviewed: Echocardiogram September 10, 2016 Left ventricle: The cavity size was normal. There was mild focal basal hypertrophy of the septum. Systolic function was normal. The estimated ejection fraction was in the range of 55% to 60%. Wall motion was normal; there were no regional wall motion abnormalities. Doppler parameters are consistent with abnormal left ventricular relaxation (grade 1 diastolic dysfunction). - Aortic valve: Trileaflet; mildly thickened, mildly calcified leaflets. There was mild regurgitation. - Mitral valve: Calcified annulus. - Left atrium: The atrium was moderately dilated.  Volume/bsa, S: 40.4 ml/m^2. - Right ventricle: The cavity size was mildly dilated. Wall thickness was normal. Systolic function was mildly reduced. - Right atrium: The atrium was moderately dilated. - Tricuspid valve: There was moderate regurgitation. - Pulmonary arteries: Systolic pressure was moderately increased. PA peak pressure: 55 mm Hg (S).   ASSESSMENT AND PLAN:  1.  Hypertension: Difficult to manage based upon his blood pressure machine readings in the setting of atrial fibrillation.  I have rechecked his blood pressure today in the office in both arms.  Right arm 166/88, left arm 160/78.  Heart rate remain elevated in the high 80s approximately 84 to 88  bpm.  I have asked him to make sure that he takes the doxazosin each day.  He is uncertain if he has taken it today but he has taken all of his other medications.  If he is already taking doxazosin 2 mg I have asked him to take an additional 2 mg so that he will be on 4 mg.  I have asked that home health go out and check his blood pressure 3 times a week for couple of weeks to evaluate his blood pressure control away from the office and correlate it with his home blood pressure machine.  2.  Atrial fibrillation: Heart rate is not currently controlled.  However he states that his heart rate gets down into the 60s at home.  I am going to place Zio cardiac monitor for 1 week, to evaluate trends and heart rate.  May need to increase metoprolol for better heart rate control which would also influence blood pressure.  I will see him back in a month to evaluate his blood pressure readings compared to his home health readings and review monitor findings.  He is not interested in seeing the atrial fibrillation clinic for other options concerning atrial fibrillation conversion to normal sinus rhythm.  He will continue Eliquis as directed.  Current medicines are reviewed at length with the patient today.    Labs/ tests ordered today include:  Cardiac monitor, home health to check his blood pressure for 2 weeks.  Phill Myron. West Pugh, ANP, AACC   04/27/2019 12:24 PM    [address] Office [phone] Fax [phone]

## 2019-04-27 NOTE — Telephone Encounter (Signed)
14 day ZIO AT long term live telemetry monitor to be mailed to the patients home.  Patient aware he needs to wear at least 7 days but to please wear up to 14 days if possible.  Instructions reviewed briefly as they are included in the monitor kit.

## 2019-04-27 NOTE — Patient Instructions (Signed)
Medication Instructions:  WHEN YOU GET HOME  MAKE SURE YOU TOOK YOUR DOXIZOSIN IF YOU HAVE, OK TO TAKE ANOTHER. IF NOT TAKE IT WHEN YOU GET HOME. If you need a refill on your cardiac medications before your next appointment, please call your pharmacy.  Testing/Procedures: Your physician has recommended that you wear a 7 DAY ZIO-PATCH monitor. The Zio patch cardiac monitor continuously records heart rhythm data for up to 14 days, this is for patients being evaluated for multiple types heart rhythms. For the first 24 hours post application, please avoid getting the Zio monitor wet in the shower or by excessive sweating during exercise. After that, feel free to carry on with regular activities. Keep soaps and lotions away from the ZIO XT Patch.  This will be MAILED OR may be placed at our [facility] location - [address].       Special Instructions: Home health will be calling to schedule an appointment at your home for BP monitoring for about 2 weeks  Follow-Up: You will need a follow up appointment in [place], [place] , Jory Sims, DNP, AACC  or one of the following Advanced Practice Providers on your designated Care Team: [place], [place] . Fabian Sharp, PA-C      At [facility], you and your health needs are our priority.  As part of our continuing mission to provide you with exceptional heart care, we have created designated Provider Care Teams.  These Care Teams include your primary Cardiologist (physician) and Advanced Practice Providers (APPs -  Physician Assistants and Nurse Practitioners) who all work together to provide you with the care you need, when you need it.  Thank you for choosing [facility] at [facility]!!

## 2019-05-01 ENCOUNTER — Ambulatory Visit (INDEPENDENT_AMBULATORY_CARE_PROVIDER_SITE_OTHER): Payer: Medicare Other

## 2019-05-01 DIAGNOSIS — I480 Paroxysmal atrial fibrillation: Secondary | ICD-10-CM | POA: Diagnosis not present

## 2019-05-02 ENCOUNTER — Telehealth: Payer: Self-pay | Admitting: Cardiovascular Disease

## 2019-05-02 NOTE — Telephone Encounter (Signed)
Dylan York needs to report abnormal EKG

## 2019-05-02 NOTE — Telephone Encounter (Signed)
Incoming call from operator transferred to triage nurse. Spoke with Rose from [facility] regarding monitor report of Afib with HR ranging from 73-113 on 5/28 at 6:50pm. Most recent transmission 6/29 at 7:35am HR 63-85.   Contacted pt who denies CP, SOB, dizziness, palpitations. Pt on Eliquis 5 mg BID and Toprol-XL 25 mg BID. He does state that he gets out of breath and tires out when active or doing physical work, but that is it. He also states he can tell when HR irregular by palpating radial pulse. Informed pt that would update Dr.Kelly and call back with any recommendations. Pt agreeable  Consulted Dr. Claiborne Billings who reviewed pt last office note. Will wait for 7-day monitor results to post so that they can be reviewed.  Contacted pt again. Pt aware of recommendations. He states he has f/u appt with Jory Sims, DNP on 7/15. Informed pt that he can f/u with heart monitor results during appt. Pt verbalized understanding.

## 2019-05-03 NOTE — Telephone Encounter (Signed)
Thank you! ° °KL °

## 2019-05-17 ENCOUNTER — Telehealth: Payer: Self-pay | Admitting: Adult Health

## 2019-05-17 NOTE — Telephone Encounter (Signed)
I called pt to confirm his appt for 05-18-19 with Jory Sims.        COVID-19 Pre-Screening Questions:   In the past 7 to 10 days have you had a cough,  shortness of breath, headache, congestion, fever (100 or greater) body aches, chills, sore throat, or sudden loss of taste or sense of smell? no  Have you been around anyone with known Covid 19.  Have you been around anyone who is awaiting Covid 19 test results in the past 7 to 10 days? no  Have you been around anyone who has been exposed to Covid 19, or has mentioned symptoms of Covid 19 within the past 7 to 10 days? no  If you have any concerns/questions about symptoms patients report during screening (either on the phone or at threshold). Contact the provider seeing the patient or DOD for further guidance.  If neither are available contact a member of the leadership team.

## 2019-05-17 NOTE — Progress Notes (Signed)
Cardiology Office Note   Date:  05/18/2019   ID:  Dylan York, DOB 06-01-1926, MRN [id number]  PCP:  Glenda Chroman, MD  Cardiologist: Dr. Claiborne Billings Chief Complaint  Patient presents with  . Follow-up     History of Present Illness: Dylan York is a 85 y.o. male who presents for ongoing assessment and management of atrial fibrillation, hypertension, and history of sick sinus syndrome.  He was last seen in the office on 04/27/2019 with complaints of labile blood pressures between 104/60 and as high as 142/88.  The patient decreased his doxazosin dose to 2 mg daily over the last month due to fluctuations in blood pressure.  Blood pressure seem to do better.  The patient on last office visit brought a lot of charts and graphs which he has color-coded to evaluate his heart rate and blood pressure trends.  Due to atrial fibrillation, the blood pressure accuracy has been a little difficult for him on his home readings.  On last office visit dated 04/27/2019 he was advised to take his doxazosin as directed at 4 mg daily.  He was to go and have healthcare providers/home health nurse to check his blood pressure for consistency at least 3 times a week to evaluate blood pressure control away from the office.Marland Kitchen  He was found to be hypertensive on the last office visit at 164/106 but had not taken his doxazosin.  Also, his heart rate was not adequately controlled.  I placed a cardiac monitor on the patient for 1 week to evaluate his heart rate control away from the office.  Question about need to increase metoprolol for better heart rate control however we will evaluate further on this office visit and compare blood pressures away from the office.  We received a phone call on 05/02/2019 from cardiac monitor technician, "Rose" regarding his monitor report with heart rate ranging from 73 to 113 bpm, with a follow-up the next day with heart rates between 63 and 85.  The patient was without complaint when he had a  follow-up phone call.  The only time he had any shortness of breath was related to exertion when he is working outside doing yard work.  He has diligently taken his BP and HR. He  Reports that 90% of the time he only takes 2 mg of doxazosin, and continues to feel tired and weak. BP has been better but he still has low BP's in the 45'W and 38'U systolic, with elevated HR's. We do not have a copy of the recent Zio monitor report with the exception of the phone note above.   He is here today to discuss his CO monitor.  He continues to keep very diligent track of his blood pressure and heart rate.  Blood pressures been ranging between 76/55 -135/101 he is symptomatic with the low blood pressures.  ZIO cardiac monitor reveals that the patient continues to be in atrial fibrillation with an average heart rate of 86 bpm.  Heart rate ranged between 50 and 154 bpm., there is no evidence of ventricular arrhythmias, pauses, V. tach, or SVT.  Past Medical History:  Diagnosis Date  . Atrial tachycardia ([place])   . BPH (benign prostatic hyperplasia)   . GERD (gastroesophageal reflux disease)   . Glaucoma   . Hypertension   . Pneumonia   . Sinus bradycardia, with HR in the 40s BB stopped. 08/08/2013  . Stroke ([place])   . Thyroid disease   . Tricuspid regurgitation  Past Surgical History:  Procedure Laterality Date  . APPENDECTOMY    . BACTERIAL OVERGROWTH TEST N/A 01/14/2016   Procedure: BACTERIAL OVERGROWTH TEST;  Surgeon: Rogene Houston, MD;  [facility];  Service: Endoscopy;  Laterality: N/A;  730  . HERNIA REPAIR    . PROSTATE SURGERY       Current Outpatient Medications  Medication Sig Dispense Refill  . COMBIGAN 0.2-0.5 % ophthalmic solution Place 1 drop into both eyes 2 (two) times daily.    Marland Kitchen docusate sodium (COLACE) 100 MG capsule Take 2 capsules (200 mg total) by mouth at bedtime. 60 capsule 0  . ELIQUIS 5 MG TABS tablet TAKE 1 TABLET BY MOUTH TWICE DAILY 180 tablet 1  .  finasteride (PROSCAR) 5 MG tablet Take 5 mg by mouth daily.    Marland Kitchen levothyroxine (SYNTHROID, LEVOTHROID) 75 MCG tablet     . LORazepam (ATIVAN) 1 MG tablet TAKE 1/2 TABLET OR 1 TABLET BY MOUTH AT BEDTIME AS NEEDED  2  . Multiple Vitamins-Minerals (MULTIVITAMIN PO) Take by mouth daily.    . Simethicone (PHAZYME) 180 MG CAPS Take by mouth daily.    . metoprolol succinate (TOPROL-XL) 25 MG 24 hr tablet Take 1 tablet (25 mg total) by mouth 2 (two) times daily. Take with or immediately following a meal. 180 tablet 1  . tamsulosin (FLOMAX) 0.4 MG CAPS capsule Take 1 capsule (0.4 mg total) by mouth daily. 30 capsule 6   No current facility-administered medications for this visit.     Allergies:   Amiodarone and Multaq [dronedarone]    Social History:  The patient  reports that he quit smoking about 42 years ago. He has never used smokeless tobacco. He reports that he does not drink alcohol or use drugs.   Family History:  The patient's family history includes Cancer in his mother; Cancer - Prostate in his paternal grandfather; Heart Problems in his father.    ROS: All other systems are reviewed and negative. Unless otherwise mentioned in H&P    PHYSICAL EXAM: VS:  Pulse 97   Temp (!) 97 F (36.1 C)   Wt 154 lb (69.9 kg)   SpO2 99%   BMI 23.42 kg/m  , BMI Body mass index is 23.42 kg/m. GEN: Well nourished, well developed, in no acute distress HEENT: normal Neck: no JVD, carotid bruits, or masses Cardiac: IRRR; no murmurs, rubs, or gallops,no edema  Respiratory:  Clear to auscultation bilaterally, normal work of breathing GI: soft, nontender, nondistended, + BS MS: no deformity or atrophy Skin: warm and dry, no rash Neuro:  Strength and sensation are intact Psych: euthymic mood, full affect   EKG: Not completed this office visit.  Recent Labs: 11/10/2018: ALT 19; BUN 23; Creatinine, Ser 1.43; Hemoglobin 11.3; Platelets 158; Potassium 4.8; Sodium 137; TSH 2.900    Lipid Panel     Component Value Date/Time   CHOL 122 03/09/2017 0831   TRIG 75 03/09/2017 0831   HDL 64 03/09/2017 0831   CHOLHDL 3.1 09/15/2016 0507   VLDL 18 09/15/2016 0507   LDLCALC 43 03/09/2017 0831      Wt Readings from Last 3 Encounters:  05/18/19 154 lb (69.9 kg)  04/27/19 155 lb (70.3 kg)  02/16/19 151 lb (68.5 kg)      Other studies Reviewed: Echocardiogram 2016/09/15 Left ventricle: The cavity size was normal. There was mild focal basal hypertrophy of the septum. Systolic function was normal. The estimated ejection fraction was in the range of  55% to 60%. Wall motion was normal; there were no regional wall motion abnormalities. Doppler parameters are consistent with abnormal left ventricular relaxation (grade 1 diastolic dysfunction). - Aortic valve: Trileaflet; mildly thickened, mildly calcified leaflets. There was mild regurgitation. - Mitral valve: Calcified annulus. - Left atrium: The atrium was moderately dilated. Volume/bsa, S: 40.4 ml/m^2. - Right ventricle: The cavity size was mildly dilated. Wall thickness was normal. Systolic function was mildly reduced. - Right atrium: The atrium was moderately dilated. - Tricuspid valve: There was moderate regurgitation. - Pulmonary arteries: Systolic pressure was moderately increased. PA peak pressure: 55 mm Hg (S).   ASSESSMENT AND PLAN:  1.  Atrial fibrillation: Review of ZIO cardiac monitor reveals an average heart rate of 86 bpm, was ranges between 50 and 154 bpm.  The patient has been educated on his cardiac monitor and we reviewed the results with him.  There were no pauses or significant bradycardia which is reassuring with his history of sick sinus syndrome.  He will continue on his current dose of metoprolol 25 mg twice daily as his heart rate range is essentially normal.  The higher heart rates were found to be artifact.  This is also been reviewed on side by Dr. Sallyanne Kuster who is DOD.  He will continue  Eliquis 5 mg twice daily as directed.  2.  Hypotension: This may be a combination of atrial fib not providing accurate blood pressure per his monitor and also use of doxazosin.  I have discussed this with Dr. Sallyanne Kuster who is recommended that we stop doxazosin and we began Flomax 0.4 mg daily instead which is better tolerated and does not allow for significant blood pressure reductions.  I will stop his hydralazine.  He will follow-up with Dr. Claiborne Billings for further review of his symptoms and blood pressure recordings.  3.  GERD: Followed by PCP.  Currently on Phazyme for gas but not on PPI or H2 blocker.  Current medicines are reviewed at length with the patient today.    Labs/ tests ordered today include:  Phill Myron. West Pugh, ANP, AACC   05/18/2019 4:43 PM    [address] Office [phone] Fax [phone]

## 2019-05-18 ENCOUNTER — Encounter: Payer: Self-pay | Admitting: Adult Health

## 2019-05-18 ENCOUNTER — Other Ambulatory Visit: Payer: Self-pay

## 2019-05-18 ENCOUNTER — Ambulatory Visit (INDEPENDENT_AMBULATORY_CARE_PROVIDER_SITE_OTHER): Payer: Medicare Other | Admitting: Adult Health

## 2019-05-18 VITALS — BP 122/64 | HR 97 | Temp 97.0°F | Ht 66.0 in | Wt 154.0 lb

## 2019-05-18 DIAGNOSIS — I952 Hypotension due to drugs: Secondary | ICD-10-CM | POA: Diagnosis not present

## 2019-05-18 DIAGNOSIS — I4811 Longstanding persistent atrial fibrillation: Secondary | ICD-10-CM | POA: Diagnosis not present

## 2019-05-18 MED ORDER — TAMSULOSIN HCL 0.4 MG PO CAPS: 0.4000 mg | ORAL_CAPSULE | Freq: Every day | ORAL | 6 refills | Status: DC

## 2019-05-18 NOTE — Patient Instructions (Addendum)
Medication Instructions:  STOP- Hydralazine STOP- Doxazosin START- Tamsulosin 0.4 mg daily  If you need a refill on your cardiac medications before your next appointment, please call your pharmacy.  Labwork: None Ordered   Testing/Procedures: None Ordered  Follow-Up: . Your physician recommends that you schedule a follow-up appointment in: Next available with Dr Claiborne Billings   At [facility], you and your health needs are our priority.  As part of our continuing mission to provide you with exceptional heart care, we have created designated Provider Care Teams.  These Care Teams include your primary Cardiologist (physician) and Advanced Practice Providers (APPs -  Physician Assistants and Nurse Practitioners) who all work together to provide you with the care you need, when you need it.  Thank you for choosing [facility] at [facility]!!

## 2019-05-30 DIAGNOSIS — I8312 Varicose veins of left lower extremity with inflammation: Secondary | ICD-10-CM | POA: Diagnosis not present

## 2019-05-30 DIAGNOSIS — L821 Other seborrheic keratosis: Secondary | ICD-10-CM | POA: Diagnosis not present

## 2019-05-30 DIAGNOSIS — Z85820 Personal history of malignant melanoma of skin: Secondary | ICD-10-CM | POA: Diagnosis not present

## 2019-05-30 DIAGNOSIS — B353 Tinea pedis: Secondary | ICD-10-CM | POA: Diagnosis not present

## 2019-05-30 DIAGNOSIS — D0439 Carcinoma in situ of skin of other parts of face: Secondary | ICD-10-CM | POA: Diagnosis not present

## 2019-05-30 DIAGNOSIS — Z85828 Personal history of other malignant neoplasm of skin: Secondary | ICD-10-CM | POA: Diagnosis not present

## 2019-05-30 DIAGNOSIS — L820 Inflamed seborrheic keratosis: Secondary | ICD-10-CM | POA: Diagnosis not present

## 2019-05-30 DIAGNOSIS — L570 Actinic keratosis: Secondary | ICD-10-CM | POA: Diagnosis not present

## 2019-05-30 DIAGNOSIS — I872 Venous insufficiency (chronic) (peripheral): Secondary | ICD-10-CM | POA: Diagnosis not present

## 2019-05-30 DIAGNOSIS — I8311 Varicose veins of right lower extremity with inflammation: Secondary | ICD-10-CM | POA: Diagnosis not present

## 2019-05-30 DIAGNOSIS — D485 Neoplasm of uncertain behavior of skin: Secondary | ICD-10-CM | POA: Diagnosis not present

## 2019-06-28 ENCOUNTER — Ambulatory Visit: Payer: Medicare Other | Admitting: Cardiovascular Disease

## 2019-07-05 NOTE — Progress Notes (Deleted)
Cardiology Office Note   Date:  07/05/2019   ID:  Dylan York, DOB 26-Dec-1925, MRN [id number]  PCP:  Glenda Chroman, MD  Cardiologist: Dylan York No chief complaint on file.    History of Present Illness: Dylan York is a 85 y.o. male who presents for ongoing assessment and management of chronic atrial fibrillation, hypertension, and a history of sick sinus syndrome.  The patient is very fixated on making sure that he keeps his blood pressure recordings and brings with him color-coded blood pressure readings on bars and graphs which he appears on his computer.  The patient had a Ziehl monitor which revealed atrial fibrillation with an average heart rate of 86 bpm.  There was no evidence of ventricular arrhythmias pauses or V. tach or SVT.  On last office visit with me on 05/18/2019 the patient was continued on his current dose of metoprolol as his heart rate range was essentially normal.  The patient's blood pressure was running slightly low based upon his recordings and therefore doxazosin was discontinued and he was started on Flomax 0.4 mg daily as this is better tolerated and did not allow for significant blood pressure reductions.  Hydralazine was discontinued.  He was to follow-up with Dylan York.    Past Medical History:  Diagnosis Date  . Atrial tachycardia ([place])   . BPH (benign prostatic hyperplasia)   . GERD (gastroesophageal reflux disease)   . Glaucoma   . Hypertension   . Pneumonia   . Sinus bradycardia, with HR in the 40s BB stopped. 08/08/2013  . Stroke ([place])   . Thyroid disease   . Tricuspid regurgitation     Past Surgical History:  Procedure Laterality Date  . APPENDECTOMY    . BACTERIAL OVERGROWTH TEST N/A 01/14/2016   Procedure: BACTERIAL OVERGROWTH TEST;  Surgeon: Rogene Houston, MD;  Location: [facility];  Service: Endoscopy;  Laterality: N/A;  730  . HERNIA REPAIR    . PROSTATE SURGERY       Current Outpatient Medications  Medication Sig Dispense  Refill  . COMBIGAN 0.2-0.5 % ophthalmic solution Place 1 drop into both eyes 2 (two) times daily.    Marland Kitchen docusate sodium (COLACE) 100 MG capsule Take 2 capsules (200 mg total) by mouth at bedtime. 60 capsule 0  . ELIQUIS 5 MG TABS tablet TAKE 1 TABLET BY MOUTH TWICE DAILY 180 tablet 1  . finasteride (PROSCAR) 5 MG tablet Take 5 mg by mouth daily.    Marland Kitchen levothyroxine (SYNTHROID, LEVOTHROID) 75 MCG tablet     . LORazepam (ATIVAN) 1 MG tablet TAKE 1/2 TABLET OR 1 TABLET BY MOUTH AT BEDTIME AS NEEDED  2  . metoprolol succinate (TOPROL-XL) 25 MG 24 hr tablet Take 1 tablet (25 mg total) by mouth 2 (two) times daily. Take with or immediately following a meal. 180 tablet 1  . Multiple Vitamins-Minerals (MULTIVITAMIN PO) Take by mouth daily.    . Simethicone (PHAZYME) 180 MG CAPS Take by mouth daily.    . tamsulosin (FLOMAX) 0.4 MG CAPS capsule Take 1 capsule (0.4 mg total) by mouth daily. 30 capsule 6   No current facility-administered medications for this visit.     Allergies:   Amiodarone and Multaq [dronedarone]    Social History:  The patient  reports that he quit smoking about 42 years ago. He has never used smokeless tobacco. He reports that he does not drink alcohol or use drugs.   Family History:  The patient's family  history includes Cancer in his mother; Cancer - Prostate in his paternal grandfather; Heart Problems in his father.    ROS: All other systems are reviewed and negative. Unless otherwise mentioned in H&P    PHYSICAL EXAM: VS:  There were no vitals taken for this visit. , BMI There is no height or weight on file to calculate BMI. GEN: Well nourished, well developed, in no acute distress HEENT: normal Neck: no JVD, carotid bruits, or masses Cardiac: ***RRR; no murmurs, rubs, or gallops,no edema  Respiratory:  Clear to auscultation bilaterally, normal work of breathing GI: soft, nontender, nondistended, + BS MS: no deformity or atrophy Skin: warm and dry, no rash Neuro:   Strength and sensation are intact Psych: euthymic mood, full affect   EKG:  EKG {ACTION; IS/IS [id number]} ordered today. The ekg ordered today demonstrates ***   Recent Labs: 11/10/2018: ALT 19; BUN 23; Creatinine, Ser 1.43; Hemoglobin 11.3; Platelets 158; Potassium 4.8; Sodium 137; TSH 2.900    Lipid Panel    Component Value Date/Time   CHOL 122 03/09/2017 0831   TRIG 75 03/09/2017 0831   HDL 64 03/09/2017 0831   CHOLHDL 3.1 29-Sep-2016 0507   VLDL 18 Sep 29, 2016 0507   LDLCALC 43 03/09/2017 0831      Wt Readings from Last 3 Encounters:  05/18/19 154 lb (69.9 kg)  04/27/19 155 lb (70.3 kg)  02/16/19 151 lb (68.5 kg)      Other studies Reviewed: Echocardiogram 2016-09-29 Left ventricle: The cavity size was normal. There was mild focal basal hypertrophy of the septum. Systolic function was normal. The estimated ejection fraction was in the range of 55% to 60%. Wall motion was normal; there were no regional wall motion abnormalities. Doppler parameters are consistent with abnormal left ventricular relaxation (grade 1 diastolic dysfunction). - Aortic valve: Trileaflet; mildly thickened, mildly calcified leaflets. There was mild regurgitation. - Mitral valve: Calcified annulus. - Left atrium: The atrium was moderately dilated. Volume/bsa, S: 40.4 ml/m^2. - Right ventricle: The cavity size was mildly dilated. Wall thickness was normal. Systolic function was mildly reduced. - Right atrium: The atrium was moderately dilated. - Tricuspid valve: There was moderate regurgitation. - Pulmonary arteries: Systolic pressure was moderately increased. PA peak pressure: 55 mm Hg (S).  Zio Monitor 05/18/2019 Atrial Fibrillation occurred continuously (100% burden), ranging from 50- 154 bpm (avg of 86 bpm). Isolated VEs were rare (<1.0%, 6245), VE Couplets were rare (<1.0%, 16), and no VE Triplets were present. Ventricular Bigeminy and Trigeminy were present.  Previously notified: MD notification criteria for First Documentation of Atrial Fibrillation met - notified Chima, RN on 6/29/202 at 8:45 AM ET. ([facility])  ASSESSMENT AND PLAN:  1.  ***   Current medicines are reviewed at length with the patient today.    Labs/ tests ordered today include: *** Phill Myron. West Pugh, ANP, [facility]   07/05/2019 7:28 AM    [facility] [place] [address] Office [phone] Fax [phone]

## 2019-07-06 ENCOUNTER — Ambulatory Visit: Payer: Medicare Other | Admitting: Adult Health

## 2019-07-07 ENCOUNTER — Telehealth (INDEPENDENT_AMBULATORY_CARE_PROVIDER_SITE_OTHER): Payer: Medicare Other | Admitting: Adult Health

## 2019-07-07 ENCOUNTER — Encounter: Payer: Self-pay | Admitting: Adult Health

## 2019-07-07 VITALS — BP 136/87 | HR 106 | Ht 66.0 in | Wt 148.0 lb

## 2019-07-07 DIAGNOSIS — I10 Essential (primary) hypertension: Secondary | ICD-10-CM

## 2019-07-07 DIAGNOSIS — Z79899 Other long term (current) drug therapy: Secondary | ICD-10-CM

## 2019-07-07 DIAGNOSIS — D508 Other iron deficiency anemias: Secondary | ICD-10-CM

## 2019-07-07 DIAGNOSIS — I480 Paroxysmal atrial fibrillation: Secondary | ICD-10-CM

## 2019-07-07 DIAGNOSIS — Z23 Encounter for immunization: Secondary | ICD-10-CM | POA: Diagnosis not present

## 2019-07-07 MED ORDER — APIXABAN 2.5 MG PO TABS: 2.5000 mg | ORAL_TABLET | Freq: Two times a day (BID) | ORAL | 6 refills | Status: DC

## 2019-07-07 MED ORDER — APIXABAN 5 MG PO TABS: 5.0000 mg | ORAL_TABLET | Freq: Two times a day (BID) | ORAL | 6 refills | Status: DC

## 2019-07-07 NOTE — Patient Instructions (Addendum)
Medication Instructions:  Continue current medications  If you need a refill on your cardiac medications before your next appointment, please call your pharmacy.  Labwork: CBC and BMP HERE IN OUR OFFICE AT [facility]  You will NOT need to fast   Take the provided lab slips with you to the lab for your blood draw.   When you have your labs (blood work) drawn today and your tests are completely normal, you will receive your results only by MyChart Message (if you have MyChart) -OR-  A paper copy in the mail.  If you have any lab test that is abnormal or we need to change your treatment, we will call you to review these results.  Testing/Procedures: None Ordered  Follow-Up: You will need a follow up appointment in 3 months.  Please call our office 2 months in advance to schedule this appointment.  You may see Shelva Majestic, MD or one of the following Advanced Practice Providers on your designated Care Team: [place], [place] . Fabian Sharp, PA-C     At [facility], you and your health needs are our priority.  As part of our continuing mission to provide you with exceptional heart care, we have created designated Provider Care Teams.  These Care Teams include your primary Cardiologist (physician) and Advanced Practice Providers (APPs -  Physician Assistants and Nurse Practitioners) who all work together to provide you with the care you need, when you need it.  Thank you for choosing [facility] at [facility]!!

## 2019-07-07 NOTE — Progress Notes (Signed)
Virtual Visit via Telephone Note   This visit type was conducted due to national recommendations for restrictions regarding the COVID-19 Pandemic (e.g. social distancing) in an effort to limit this patient's exposure and mitigate transmission in our community.  Due to his co-morbid illnesses, this patient is at least at moderate risk for complications without adequate follow up.  This format is felt to be most appropriate for this patient at this time.  The patient did not have access to video technology/had technical difficulties with video requiring transitioning to audio format only (telephone).  All issues noted in this document were discussed and addressed.  No physical exam could be performed with this format.  Please refer to the patient's chart for his  consent to telehealth for [facility].   Date:  07/07/2019   ID:  Dylan York, DOB 09/03/1926, MRN [id number]  Patient Location: Home Provider Location: Home  PCP:  Glenda Chroman, MD  Cardiologist:  Shelva Majestic, MD  Electrophysiologist:  None   Evaluation Performed:  Follow-Up Visit  Chief Complaint:  Follow Up  History of Present Illness:    Dylan York is a 85 y.o. male for ongoing assessment and management of hypertension with multiple medication changes, atrial fibrillation on Eliquis. He is very detailed on monitoring his BP and creating grafts and charts. He wears compression hose daily to help with LEE.   He is only taking metoprolol 25 mg BID. On last office visit we stopped his doxazosin and changed to Flomax to avoid large fluctuations in BP. He has been having less labile BP since stopping the Doxazosin.  He averages between 123456 systolic and 123456 diastolic.  HR ranges in the 80's.  Some higher readings are noted.  He denies bleeding or excessive bruising. He has multiple questions and is concerned about diastolic BP readings. He admits that he does not limit salt intake. He still has some complaints of  fatigue.   The patient does not have symptoms concerning for COVID-19 infection (fever, chills, cough, or new shortness of breath).    Past Medical History:  Diagnosis Date  . Atrial tachycardia ([place])   . BPH (benign prostatic hyperplasia)   . GERD (gastroesophageal reflux disease)   . Glaucoma   . Hypertension   . Pneumonia   . Sinus bradycardia, with HR in the 40s BB stopped. 08/08/2013  . Stroke ([place])   . Thyroid disease   . Tricuspid regurgitation    Past Surgical History:  Procedure Laterality Date  . APPENDECTOMY    . BACTERIAL OVERGROWTH TEST N/A 01/14/2016   Procedure: BACTERIAL OVERGROWTH TEST;  Surgeon: Rogene Houston, MD;  Location[facility];  Service: Endoscopy;  Laterality: N/A;  730  . HERNIA REPAIR    . PROSTATE SURGERY       Current Meds  Medication Sig  . COMBIGAN 0.2-0.5 % ophthalmic solution Place 1 drop into both eyes 2 (two) times daily.  Marland Kitchen docusate sodium (COLACE) 100 MG capsule Take 2 capsules (200 mg total) by mouth at bedtime.  Marland Kitchen ELIQUIS 5 MG TABS tablet TAKE 1 TABLET BY MOUTH TWICE DAILY  . finasteride (PROSCAR) 5 MG tablet Take 5 mg by mouth daily.  Marland Kitchen levothyroxine (SYNTHROID, LEVOTHROID) 75 MCG tablet   . LORazepam (ATIVAN) 1 MG tablet TAKE 1/2 TABLET OR 1 TABLET BY MOUTH AT BEDTIME AS NEEDED  . metoprolol succinate (TOPROL-XL) 25 MG 24 hr tablet Take 1 tablet (25 mg total) by mouth 2 (two) times  daily. Take with or immediately following a meal.  . Multiple Vitamins-Minerals (MULTIVITAMIN PO) Take by mouth daily.  . Simethicone (PHAZYME) 180 MG CAPS Take by mouth daily.  . tamsulosin (FLOMAX) 0.4 MG CAPS capsule Take 1 capsule (0.4 mg total) by mouth daily.     Allergies:   Amiodarone and Multaq [dronedarone]   Social History   Tobacco Use  . Smoking status: Former Smoker    Quit date: 11/03/1976    Years since quitting: 42.7  . Smokeless tobacco: Never Used  Substance Use Topics  . Alcohol use: No    Alcohol/week: 0.0 standard drinks   . Drug use: No     Family Hx: The patient's family history includes Cancer in his mother; Cancer - Prostate in his paternal grandfather; Heart Problems in his father.  ROS:   Please see the history of present illness.    All other systems reviewed and are negative.   Prior CV studies:   The following studies were reviewed today: Echocardiogram 09/08/2016  Left ventricle: The cavity size was normal. There was mild focal   basal hypertrophy of the septum. Systolic function was normal.   The estimated ejection fraction was in the range of 55% to 60%.   Wall motion was normal; there were no regional wall motion   abnormalities. Doppler parameters are consistent with abnormal   left ventricular relaxation (grade 1 diastolic dysfunction). - Aortic valve: Trileaflet; mildly thickened, mildly calcified   leaflets. There was mild regurgitation. - Mitral valve: Calcified annulus. - Left atrium: The atrium was moderately dilated. Volume/bsa, S:   40.4 ml/m^2. - Right ventricle: The cavity size was mildly dilated. Wall   thickness was normal. Systolic function was mildly reduced. - Right atrium: The atrium was moderately dilated. - Tricuspid valve: There was moderate regurgitation. - Pulmonary arteries: Systolic pressure was moderately increased.   PA peak pressure: 55 mm Hg (S).  Labs/Other Tests and Data Reviewed:    EKG:  No ECG reviewed.  Recent Labs: 11/10/2018: ALT 19; BUN 23; Creatinine, Ser 1.43; Hemoglobin 11.3; Platelets 158; Potassium 4.8; Sodium 137; TSH 2.900   Recent Lipid Panel Lab Results  Component Value Date/Time   CHOL 122 03/09/2017 08:31 AM   TRIG 75 03/09/2017 08:31 AM   HDL 64 03/09/2017 08:31 AM   CHOLHDL 3.1 09/08/2016 05:07 AM   LDLCALC 43 03/09/2017 08:31 AM    Wt Readings from Last 3 Encounters:  07/07/19 148 lb (67.1 kg)  05/18/19 154 lb (69.9 kg)  04/27/19 155 lb (70.3 kg)     Objective:    Vital Signs:  BP 136/87   Pulse (!) 106   Ht 5\' 6"   (1.676 m)   Wt 148 lb (67.1 kg)   BMI 23.89 kg/m    VITAL SIGNS:  reviewed GEN:  no acute distress NEURO:  alert and oriented x 3, no obvious focal deficit PSYCH:  normal affect  ASSESSMENT & PLAN:    1. Hypertension: Dylan York appears to be doing fairly well.  He still is very obsessive about his blood pressure recordings.  He prepares charts, grafts, and lists of his blood pressures throughout the day.  He has mailed a several copies of this.  It appears that his blood pressure is less labile off of doxazosin and starting on Flomax.  The patient is concerned about diastolic levels.  I explained to him with his age and longstanding hypertension, he may have some elevated diastolic blood pressures.  He is to  avoid salt.  Because he is in atrial fibrillation it is uncertain how accurate some of the blood pressures are due to irregular heart rhythm.  I will not make any changes at this time as he appears stable.  I am going to repeat a BMET to evaluate kidney function.  2.  Atrial fibrillation: He remains on metoprolol XL 25 mg twice daily.  He states his average heart rate is in the 80s but he has had some around 100.  I do not want to titrate up metoprolol at this time until seen in person with an EKG.  Concerning Eliquis due to his age greater than 56, creatinine of 1.5, I am going to keep his Eliquis at 5 mg  twice daily and repeat CBC and BMET in 1 month. If creatinine increases may need to decrease the dose.   He is requesting samples as he is in the "donut hole" and we are doing our best to get in touch with drug reps to allow [place] to provide him with samples. CHADS VASC Score of 5.   3. Anemia: Last check of CBC was in January 2020 with Hgb of 11.3, Hct 32.5. Repeating CBC, especially on Eliquis. He denies black tarry stools, blood in urine or epistaxis   COVID-19 Education: The signs and symptoms of COVID-19 were discussed with the patient and how to seek care for testing (follow up with  PCP or arrange E-visit).  The importance of social distancing was discussed today.  Time:   Today, I have spent 20 minutes with the patient with telehealth technology discussing the above problems.     Medication Adjustments/Labs and Tests Ordered: Current medicines are reviewed at length with the patient today.  Concerns regarding medicines are outlined above.   Tests Ordered: No orders of the defined types were placed in this encounter.   Medication Changes: No orders of the defined types were placed in this encounter.   Disposition:  Follow up 3 months.   Signed, Phill Myron. West Pugh, ANP, AACC  07/07/2019 11:33 AM    [facility]

## 2019-07-15 DIAGNOSIS — D508 Other iron deficiency anemias: Secondary | ICD-10-CM | POA: Diagnosis not present

## 2019-07-15 DIAGNOSIS — Z79899 Other long term (current) drug therapy: Secondary | ICD-10-CM | POA: Diagnosis not present

## 2019-07-16 LAB — CBC
Hematocrit: 36.2 % — ABNORMAL LOW (ref 37.5–51.0)
Hemoglobin: 11.6 g/dL — ABNORMAL LOW (ref 13.0–17.7)
MCH: 30.9 pg (ref 26.6–33.0)
MCHC: 32 g/dL (ref 31.5–35.7)
MCV: 97 fL (ref 79–97)
Platelets: 158 10*3/uL (ref 150–450)
RBC: 3.75 x10E6/uL — ABNORMAL LOW (ref 4.14–5.80)
RDW: 13.2 % (ref 11.6–15.4)
WBC: 4.4 10*3/uL (ref 3.4–10.8)

## 2019-07-16 LAB — BASIC METABOLIC PANEL
BUN/Creatinine Ratio: 18 (ref 10–24)
BUN: 21 mg/dL (ref 10–36)
CO2: 22 mmol/L (ref 20–29)
Calcium: 9.1 mg/dL (ref 8.6–10.2)
Chloride: 105 mmol/L (ref 96–106)
Creatinine, Ser: 1.19 mg/dL (ref 0.76–1.27)
GFR calc Af Amer: 62 mL/min/{1.73_m2} (ref >59)
GFR calc non Af Amer: 53 mL/min/{1.73_m2} — ABNORMAL LOW (ref >59)
Glucose: 89 mg/dL (ref 65–99)
Potassium: 4 mmol/L (ref 3.5–5.2)
Sodium: 140 mmol/L (ref 134–144)

## 2019-07-18 DIAGNOSIS — M9901 Segmental and somatic dysfunction of cervical region: Secondary | ICD-10-CM | POA: Diagnosis not present

## 2019-07-18 DIAGNOSIS — M47812 Spondylosis without myelopathy or radiculopathy, cervical region: Secondary | ICD-10-CM | POA: Diagnosis not present

## 2019-07-18 DIAGNOSIS — M542 Cervicalgia: Secondary | ICD-10-CM | POA: Diagnosis not present

## 2019-07-21 DIAGNOSIS — M47812 Spondylosis without myelopathy or radiculopathy, cervical region: Secondary | ICD-10-CM | POA: Diagnosis not present

## 2019-07-21 DIAGNOSIS — M9901 Segmental and somatic dysfunction of cervical region: Secondary | ICD-10-CM | POA: Diagnosis not present

## 2019-07-21 DIAGNOSIS — M542 Cervicalgia: Secondary | ICD-10-CM | POA: Diagnosis not present

## 2019-07-22 DIAGNOSIS — M542 Cervicalgia: Secondary | ICD-10-CM | POA: Diagnosis not present

## 2019-07-22 DIAGNOSIS — M47812 Spondylosis without myelopathy or radiculopathy, cervical region: Secondary | ICD-10-CM | POA: Diagnosis not present

## 2019-07-22 DIAGNOSIS — M9901 Segmental and somatic dysfunction of cervical region: Secondary | ICD-10-CM | POA: Diagnosis not present

## 2019-07-25 DIAGNOSIS — N138 Other obstructive and reflux uropathy: Secondary | ICD-10-CM | POA: Diagnosis not present

## 2019-07-25 DIAGNOSIS — N411 Chronic prostatitis: Secondary | ICD-10-CM | POA: Diagnosis not present

## 2019-07-25 DIAGNOSIS — N281 Cyst of kidney, acquired: Secondary | ICD-10-CM | POA: Diagnosis not present

## 2019-07-25 DIAGNOSIS — N401 Enlarged prostate with lower urinary tract symptoms: Secondary | ICD-10-CM | POA: Diagnosis not present

## 2019-07-26 DIAGNOSIS — M542 Cervicalgia: Secondary | ICD-10-CM | POA: Diagnosis not present

## 2019-07-26 DIAGNOSIS — M47812 Spondylosis without myelopathy or radiculopathy, cervical region: Secondary | ICD-10-CM | POA: Diagnosis not present

## 2019-07-26 DIAGNOSIS — M9901 Segmental and somatic dysfunction of cervical region: Secondary | ICD-10-CM | POA: Diagnosis not present

## 2019-08-16 ENCOUNTER — Ambulatory Visit (INDEPENDENT_AMBULATORY_CARE_PROVIDER_SITE_OTHER): Payer: Medicare Other | Admitting: Internal Medicine

## 2019-08-25 DIAGNOSIS — H401122 Primary open-angle glaucoma, left eye, moderate stage: Secondary | ICD-10-CM | POA: Diagnosis not present

## 2019-08-25 DIAGNOSIS — Z961 Presence of intraocular lens: Secondary | ICD-10-CM | POA: Diagnosis not present

## 2019-08-25 DIAGNOSIS — H401111 Primary open-angle glaucoma, right eye, mild stage: Secondary | ICD-10-CM | POA: Diagnosis not present

## 2019-08-29 ENCOUNTER — Other Ambulatory Visit: Payer: Self-pay | Admitting: Cardiovascular Disease

## 2019-09-28 DIAGNOSIS — I480 Paroxysmal atrial fibrillation: Secondary | ICD-10-CM | POA: Diagnosis not present

## 2019-09-28 DIAGNOSIS — L309 Dermatitis, unspecified: Secondary | ICD-10-CM | POA: Diagnosis not present

## 2019-09-28 DIAGNOSIS — Z6824 Body mass index (BMI) 24.0-24.9, adult: Secondary | ICD-10-CM | POA: Diagnosis not present

## 2019-09-28 DIAGNOSIS — Z299 Encounter for prophylactic measures, unspecified: Secondary | ICD-10-CM | POA: Diagnosis not present

## 2019-09-28 DIAGNOSIS — I10 Essential (primary) hypertension: Secondary | ICD-10-CM | POA: Diagnosis not present

## 2019-09-28 DIAGNOSIS — I471 Supraventricular tachycardia: Secondary | ICD-10-CM | POA: Diagnosis not present

## 2019-09-28 DIAGNOSIS — Z87891 Personal history of nicotine dependence: Secondary | ICD-10-CM | POA: Diagnosis not present

## 2019-11-01 DIAGNOSIS — Z7189 Other specified counseling: Secondary | ICD-10-CM | POA: Diagnosis not present

## 2019-11-01 DIAGNOSIS — Z6824 Body mass index (BMI) 24.0-24.9, adult: Secondary | ICD-10-CM | POA: Diagnosis not present

## 2019-11-01 DIAGNOSIS — Z0000 Encounter for general adult medical examination without abnormal findings: Secondary | ICD-10-CM | POA: Diagnosis not present

## 2019-11-01 DIAGNOSIS — R5383 Other fatigue: Secondary | ICD-10-CM | POA: Diagnosis not present

## 2019-11-01 DIAGNOSIS — I10 Essential (primary) hypertension: Secondary | ICD-10-CM | POA: Diagnosis not present

## 2019-11-01 DIAGNOSIS — Z1331 Encounter for screening for depression: Secondary | ICD-10-CM | POA: Diagnosis not present

## 2019-11-01 DIAGNOSIS — E7800 Pure hypercholesterolemia, unspecified: Secondary | ICD-10-CM | POA: Diagnosis not present

## 2019-11-01 DIAGNOSIS — Z299 Encounter for prophylactic measures, unspecified: Secondary | ICD-10-CM | POA: Diagnosis not present

## 2019-11-01 DIAGNOSIS — Z1211 Encounter for screening for malignant neoplasm of colon: Secondary | ICD-10-CM | POA: Diagnosis not present

## 2019-11-01 DIAGNOSIS — E039 Hypothyroidism, unspecified: Secondary | ICD-10-CM | POA: Diagnosis not present

## 2019-11-01 DIAGNOSIS — Z1339 Encounter for screening examination for other mental health and behavioral disorders: Secondary | ICD-10-CM | POA: Diagnosis not present

## 2019-11-08 ENCOUNTER — Other Ambulatory Visit (INDEPENDENT_AMBULATORY_CARE_PROVIDER_SITE_OTHER): Payer: Self-pay | Admitting: *Deleted

## 2019-11-08 ENCOUNTER — Encounter (INDEPENDENT_AMBULATORY_CARE_PROVIDER_SITE_OTHER): Payer: Self-pay | Admitting: Internal Medicine

## 2019-11-08 ENCOUNTER — Other Ambulatory Visit: Payer: Self-pay

## 2019-11-08 ENCOUNTER — Ambulatory Visit (INDEPENDENT_AMBULATORY_CARE_PROVIDER_SITE_OTHER): Payer: Medicare Other | Admitting: Internal Medicine

## 2019-11-08 DIAGNOSIS — R1310 Dysphagia, unspecified: Secondary | ICD-10-CM | POA: Insufficient documentation

## 2019-11-08 DIAGNOSIS — D649 Anemia, unspecified: Secondary | ICD-10-CM

## 2019-11-08 DIAGNOSIS — R1319 Other dysphagia: Secondary | ICD-10-CM | POA: Diagnosis not present

## 2019-11-08 NOTE — Progress Notes (Signed)
Virtual Visit via Telephone Note  Patient has scheduled face-to-face visit today.  Visit was changed to virtual/telephone visit because of ongoing Covid-19 pandemic and we both agreed. I connected with Dylan York on 11/08/19 at 11:56 AM EST by telephone and verified that I am speaking with the correct person using two identifiers.  Location: Patient: home Provider: office   I discussed the limitations, risks, security and privacy concerns of performing an evaluation and management service by telephone and the availability of in person appointments. I also discussed with the patient that there may be a patient responsible charge related to this service. The patient expressed understanding and agreed to proceed.   History of present illness.  Patient has history of intractable flatulence felt to be secondary to small intestinal bacterial overgrowth and he responded to p.o. antibiotic.  He was doing well when he was last seen in October 2019.  He has been using Phazyme on as-needed basis.  Past history is also significant for elevated transaminases which returned to normal on stopping amiodarone.  He has chronic atrial fibrillation and is on anticoagulant. He had blood work on 10/05/2019 and his hemoglobin was low. He denies melena or rectal bleeding.  He also denies hematuria.  He states he had Hemoccult done last year was negative.  He is supposed to repeat it.  He states he has been doing very well as far as his bowel movements are concerned.  He has a formed stool every day or every other day.  He is using Phazyme once in a while.  His new complaint is 1 of dysphagia.  He has difficulty drinking liquids.  He starts to cough.  He also feels he cannot get her to phlegm from his throat.  He has some cough.  He also complains of swallowing difficulty with solids.  He has not had an episode of food impaction.  He also has not been treated for bronchitis or breathing issues.  His weight has been  stable.   Observations/Objective:   Lab data from 06/05/2016 Serum iron 107 TIBC 240 and saturation 45% Serum ferritin was 77 Serum folate greater than 20 Serum B12 839.  Lab data from 11/01/2019 WBC 4.2, H&H 10.1 and 39.9 Platelet count 151K  Glucose 99, BUN 16, creatinine 0.92 Serum sodium 143, potassium 4.1, chloride 109, CO2 22 Serum calcium 8.7 Total protein 5.8 with albumin of 3.5 Bilirubin 1.2, AP 76, AST 30, ALT 16  TSH 0.500  Assessment and Plan:  #1.  Dysphagia.  Patient appears to have both oropharyngeal and esophageal dysphagia.  Given his age he most likely has an esophageal motility disorder.  Will assess esophageal function first and then consider evaluation by speech pathologist.  #2. Anemia.  Patient has history of mild anemia.  Hemoglobin has dropped recently.  No evidence of overt GI bleed.  He was evaluated for anemia back in August 2017 and his iron studies as well as B12 and folate levels were normal.  Since his hemoglobin has dropped significantly he may need to be reevaluated.  I do not have any objection about him taking p.o. iron as recommended by his primary care physician.  #3.  History of flatulence thought to be due to small intestinal bacterial overgrowth treated with antibiotics in the past but presently is doing well with as needed simethicone.  Follow Up Instructions:  Barium pill esophagogram.  I discussed the assessment and treatment plan with the patient. The patient was provided an opportunity to ask  questions and all were answered. The patient agreed with the plan and demonstrated an understanding of the instructions.   The patient was advised to call back or seek an in-person evaluation if the symptoms worsen or if the condition fails to improve as anticipated.  I provided 18 minutes of non-face-to-face time during this encounter.   Hildred Laser, MD

## 2019-11-08 NOTE — Progress Notes (Signed)
Order put in.

## 2019-11-09 NOTE — Patient Instructions (Signed)
Physician will call with results of barium study when completed. 

## 2019-11-16 ENCOUNTER — Other Ambulatory Visit: Payer: Self-pay | Admitting: Adult Health

## 2019-11-17 ENCOUNTER — Ambulatory Visit (HOSPITAL_COMMUNITY): Payer: Medicare Other

## 2019-11-21 ENCOUNTER — Telehealth (INDEPENDENT_AMBULATORY_CARE_PROVIDER_SITE_OTHER): Payer: Medicare Other | Admitting: Cardiovascular Disease

## 2019-11-21 ENCOUNTER — Encounter: Payer: Self-pay | Admitting: Cardiovascular Disease

## 2019-11-21 VITALS — BP 83/61 | HR 98 | Ht 68.0 in | Wt 146.0 lb

## 2019-11-21 DIAGNOSIS — N183 Chronic kidney disease, stage 3 unspecified: Secondary | ICD-10-CM | POA: Diagnosis not present

## 2019-11-21 DIAGNOSIS — Z7901 Long term (current) use of anticoagulants: Secondary | ICD-10-CM

## 2019-11-21 DIAGNOSIS — I4811 Longstanding persistent atrial fibrillation: Secondary | ICD-10-CM

## 2019-11-21 DIAGNOSIS — D508 Other iron deficiency anemias: Secondary | ICD-10-CM | POA: Diagnosis not present

## 2019-11-21 DIAGNOSIS — E039 Hypothyroidism, unspecified: Secondary | ICD-10-CM | POA: Diagnosis not present

## 2019-11-21 DIAGNOSIS — I10 Essential (primary) hypertension: Secondary | ICD-10-CM | POA: Diagnosis not present

## 2019-11-21 MED ORDER — METOPROLOL SUCCINATE ER 25 MG PO TB24: ORAL_TABLET | ORAL | 3 refills | Status: DC

## 2019-11-21 NOTE — Patient Instructions (Signed)
Medication Instructions:  INCREASE- Metoprolol 37.5 mg ( 1 1/2 tablet) in the morning and 25 mg (1 tablet) in the evening  *If you need a refill on your cardiac medications before your next appointment, please call your pharmacy*  Lab Work: None Ordered  Testing/Procedures: None Ordered  Follow-Up: At [facility], you and your health needs are our priority.  As part of our continuing mission to provide you with exceptional heart care, we have created designated Provider Care Teams.  These Care Teams include your primary Cardiologist (physician) and Advanced Practice Providers (APPs -  Physician Assistants and Nurse Practitioners) who all work together to provide you with the care you need, when you need it.  Your next appointment:   Wednesday April 14th @ 2:00 pm  The format for your next appointment:   In Person

## 2019-11-21 NOTE — Progress Notes (Signed)
Virtual Visit via Telephone Note   This visit type was conducted due to national recommendations for restrictions regarding the COVID-19 Pandemic (e.g. social distancing) in an effort to limit this patient's exposure and mitigate transmission in our community.  Due to his co-morbid illnesses, this patient is at least at moderate risk for complications without adequate follow up.  This format is felt to be most appropriate for this patient at this time.  The patient did not have access to video technology/had technical difficulties with video requiring transitioning to audio format only (telephone).  All issues noted in this document were discussed and addressed.  No physical exam could be performed with this format.  Please refer to the patient's chart for his  consent to telehealth for [facility].   Date:  11/21/2019   ID:  Dylan York, DOB 1926-05-13, MRN [id number]  Patient Location: Home Provider Location: Office  PCP:  Glenda Chroman, MD  Cardiologist:  Shelva Majestic, MD  Electrophysiologist:  None   Evaluation Performed:  Follow-Up Visit  Chief Complaint:  9 month F/U  History of Present Illness:    Dylan York is a 86 y.o. male who has a history of sick sinus syndrome with atrial tachycardia as well as history of sinus bradycardia, ventricular bigeminy PVCs PAF and difficult to control hypertension.He is also followed by [name] for urologic issues particular regarding his documented large renal cysts.  He has had difficulty over the years with significant blood pressure lability.  He has been exceedingly comprehensive with frequent blood pressure recordings between each office visit and statistical analysis of his data.  He has had issues with atrial fibrillation as well as atrial flutter.  I last saw him in September 2019. At that time, he was on spironolactone 12.5 mg twice a day, hydralazine 50 mg every 8 hours, doxazosin 4 mg daily.  He was having some leg swelling  and I suggested support stockings.  Remotely he had developed LFT elevation and was taken off amiodarone and atorvastatin.  During that evaluation he was in sinus rhythm with sinus bradycardia at 54 bpm.  We had a long discussion today any recalled all his blood pressure data since his last evaluation.  Apparently, several months after amiodarone was discontinued in [place] he felt his heart rate increasing.  Apparently he was not evaluated until he saw Jory Sims on November 10, 2018.  At that time he was in atrial fibrillation.  His pulse was 106.  Apparently he was feeling weak and dizzy.  Spironolactone was discontinued.  At follow-up evaluation on November 24, 2018 he was still in A. fib with increased rate.  He was started on metoprolol at that time and hydralazine dose was reduced.  He was seen by Jory Sims on December 29, 2018 and his blood pressure was low 103/69 with a pulse of 93.  Hydralazine was further decreased to 12.5 mg twice a day and metoprolol was increased to 25 mg in the morning and 12.5 mg at night.\  I last saw him in a telemedicine evaluation in April 2020.  At that time he felt that he was doing better and his blood pressure was running  in the 108 to 20 range.  However over the last week his blood pressure has been running in the [id number] systolically.  He denied chest pain PND orthopnea.  He does have some mild renal insufficiency with his last creatinine at 1.43.  He was sleeping well and  denied any chest pain.  He denied any episodes of significant tachycardia.  He was last evaluated by Bunnie Domino in a telemedicine visit in September 2020.  He had had multiple medication changes and he was no longer on doxazosin and was on Flomax as well as metoprolol succinate 25 mg twice a day.  He has been documented to be in atrial fibrillation on 11 April 2019 ECG.  No EKG was done in September but his pulse rate was ranging in the 80-100 range.  He continued to be on  Eliquis for anticoagulation for atrial fibrillation.  Since I saw him, he apparently has had issues with anemia and is followed by Dr. Wonda Amis.  He was started on ferrous sulfate for  hemoglobin of 10.1 and hematocrit of 30.9.  He has continued to be very compulsive with blood pressure recordings.  He is close he followed by his primary physician Dr. Woody Seller.   He was evaluated in a telemedicine visit today.  He tells me his blood pressure has been labile in 1 week ago had dropped to 83/61 briefly.  However most readings seem to indicate a blood pressure at 124/85, 120/85, however he stated there was 1 reading which was 169/80 which was questionable.  Presently he denies chest pain or shortness of breath.  He admits to easy fatigability.  He will be having repeat laboratory.  The patient does not have symptoms concerning for COVID-19 infection (fever, chills, cough, or new shortness of breath).    Past Medical History:  Diagnosis Date  . Atrial tachycardia ([place])   . BPH (benign prostatic hyperplasia)   . GERD (gastroesophageal reflux disease)   . Glaucoma   . Hypertension   . Pneumonia   . Sinus bradycardia, with HR in the 40s BB stopped. 08/08/2013  . Stroke ([place])   . Thyroid disease   . Tricuspid regurgitation    Past Surgical History:  Procedure Laterality Date  . APPENDECTOMY    . BACTERIAL OVERGROWTH TEST N/A 01/14/2016   Procedure: BACTERIAL OVERGROWTH TEST;  Surgeon: Rogene Houston, MD;  [facility];  Service: Endoscopy;  Laterality: N/A;  730  . HERNIA REPAIR    . PROSTATE SURGERY       Current Meds  Medication Sig  . apixaban (ELIQUIS) 5 MG TABS tablet Take 1 tablet (5 mg total) by mouth 2 (two) times daily.  . COMBIGAN 0.2-0.5 % ophthalmic solution Place 1 drop into both eyes 2 (two) times daily.  Marland Kitchen docusate sodium (COLACE) 100 MG capsule Take 2 capsules (200 mg total) by mouth at bedtime.  . finasteride (PROSCAR) 5 MG tablet Take 5 mg by mouth daily.  Marland Kitchen  levothyroxine (SYNTHROID, LEVOTHROID) 75 MCG tablet Take 75 mcg by mouth daily before breakfast.   . LORazepam (ATIVAN) 1 MG tablet TAKE 1/2 TABLET OR 1 TABLET BY MOUTH AT BEDTIME AS NEEDED  . metoprolol succinate (TOPROL-XL) 25 MG 24 hr tablet TAKE 1 TABLET BY MOUTH TWICE DAILY WITH OR IMMEDIATELY FOLLOWING A MEAL  . Multiple Vitamins-Minerals (CENTRUM SILVER PO) Take by mouth daily.  . Multiple Vitamins-Minerals (MULTIVITAMIN PO) Take by mouth daily.  . Simethicone (PHAZYME) 180 MG CAPS Take by mouth daily.  . tamsulosin (FLOMAX) 0.4 MG CAPS capsule TAKE ONE CAPSULE BY MOUTH DAILY  . triamcinolone cream (KENALOG) 0.1 % APPLY SPARINGLY TWICE DAILY FOR RASH ON FACE AND NOSE  . Vitamin D, Cholecalciferol, 50 MCG (2000 UT) CAPS Take by mouth daily.     Allergies:  Amiodarone and Multaq [dronedarone]   Social History   Tobacco Use  . Smoking status: Former Smoker    Quit date: 11/03/1976    Years since quitting: 43.0  . Smokeless tobacco: Never Used  Substance Use Topics  . Alcohol use: No    Alcohol/week: 0.0 standard drinks  . Drug use: No     Family Hx: The patient's family history includes Cancer in his mother; Cancer - Prostate in his paternal grandfather; Heart Problems in his father.  ROS:   Please see the history of present illness.    No fevers chills night sweats No cough loss of taste or smell Positive for fatigue No chest pain No wheezing Positive for anemia and reportedly had slight guaiac positivity on a prior stool, followed by Dr. Laural Golden No edema Sleeping well All other systems reviewed and are negative.   Prior CV studies:   The following studies were reviewed today:   Echocardiogram 09/08/2016 Left ventricle: The cavity size was normal. There was mild focal basal hypertrophy of the septum. Systolic function was normal. The estimated ejection fraction was in the range of 55% to 60%. Wall motion was normal; there were no regional wall  motion abnormalities. Doppler parameters are consistent with abnormal left ventricular relaxation (grade 1 diastolic dysfunction). - Aortic valve: Trileaflet; mildly thickened, mildly calcified leaflets. There was mild regurgitation. - Mitral valve: Calcified annulus. - Left atrium: The atrium was moderately dilated. Volume/bsa, S: 40.4 ml/m^2. - Right ventricle: The cavity size was mildly dilated. Wall thickness was normal. Systolic function was mildly reduced. - Right atrium: The atrium was moderately dilated. - Tricuspid valve: There was moderate regurgitation. - Pulmonary arteries: Systolic pressure was moderately increased. PA peak pressure: 55 mm Hg (S).   Labs/Other Tests and Data Reviewed:    EKG:  An ECG dated 04/26/2019 was personally reviewed today and demonstrated:  Atrial fibrillation with ventricular rate at 92 bpm   ECG from December 29, 2018 which revealed atrial fibrillation at 87 bpm.  QTc interval 471 ms.  Recent Labs: 07/15/2019: BUN 21; Creatinine, Ser 1.19; Hemoglobin 11.6; Platelets 158; Potassium 4.0; Sodium 140   Recent Lipid Panel Lab Results  Component Value Date/Time   CHOL 122 03/09/2017 08:31 AM   TRIG 75 03/09/2017 08:31 AM   HDL 64 03/09/2017 08:31 AM   CHOLHDL 3.1 09/08/2016 05:07 AM   LDLCALC 43 03/09/2017 08:31 AM    Wt Readings from Last 3 Encounters:  11/21/19 146 lb (66.2 kg)  11/08/19 146 lb (66.2 kg)  07/07/19 148 lb (67.1 kg)        Objective:    Vital Signs:  BP (!) 83/61 Comment: patient took this reading on 11/16/2019  Pulse 98   Ht 5\' 8"  (1.727 m)   Wt 146 lb (66.2 kg)   BMI 22.20 kg/m    Blood pressure recording of 83/61 was not today the patient states this occurred briefly on November 18, 2019.  His blood pressure today was 120/85  Since this was a telemedicine visit I could not physically examine the patient. He did not have labored breathing. There is no audible wheezing He denied any chest  pressure His heart rhythm was regular with a controlled rate in the 80s to 90 range, but at times he states it gets over 100 He denied abdominal discomfort He denied any bright red blood per rectum No edema No awareness of neurologic change   ASSESSMENT & PLAN:    1. Essential hypertension: He does  have blood pressure lability.  He has been very compulsive with frequent blood pressure recordings.  He may have some erroneous readings particularly with his 1 reading where he stated blood pressure increased to 169/108.  With his pulse in the 90s I am titrating Toprol-XL to 37.5 mg in the morning and 25 mg at night.  If he still notes increasing heart rate after 1 to 2 weeks of increased reasonable 5 mg twice a day. 2. Persistent atrial fibrillation: As noted above, I will slightly titrate metoprolol succinate to 37.5 mg in the morning and 25 mg at night but if heart rate consistently stays in the 90s to 100 range further titration to 37.5 mg twice a day will be undertaken 3. Anticoagulation: He currently is on Eliquis.  With his recently normal creatinine at 0.92 and with his weight, he continues to be on 5 mg twice a day. 4. Anemia: He tells me on November 01, 2019 hemoglobin 10.1 hematocrit 30.9.  He was started on ferrous sulfate by Dr. Melony Overly.  Apparently there may have been a trace guaiac positivity noted at that time.  He is being followed by the GI doctor with plans for follow-up laboratory.  If he does have continued anemia and GI bleeding Eliquis will need to be discontinued. 5. Renal insufficiency: Creatinine improved from 1.43 torecently 0.92 6. History of LFT elevation on amiodarone necessitating discontinuance. 7. Hypothyroidism.  Most recent TSH was 0.5 on his current dose of levothyroxine at 75 mcg.  I have recommended that this be rechecked and if he is nearing TSH over suppression dose reduction of levothyroxine will be necessary.  This may be contributing to his increased A. fib  rate.  COVID-19 Education: The signs and symptoms of COVID-19 were discussed with the patient and how to seek care for testing (follow up with PCP or arrange E-visit).  The importance of social distancing was discussed today.  Time:   Today, I have spent 29 minutes with the patient with telehealth technology discussing the above problems.     Medication Adjustments/Labs and Tests Ordered: Current medicines are reviewed at length with the patient today.  Concerns regarding medicines are outlined above.   Tests Ordered: No orders of the defined types were placed in this encounter.   Medication Changes: No orders of the defined types were placed in this encounter.   Follow Up: 3 months, in person if possible  Signed, Shelva Majestic, MD  11/21/2019 11:24 AM    [place]

## 2019-11-22 ENCOUNTER — Ambulatory Visit (HOSPITAL_COMMUNITY)
Admission: RE | Admit: 2019-11-22 | Discharge: 2019-11-22 | Disposition: A | Discharge status: Home / Self Care | Payer: Medicare Other | Source: Ambulatory Visit | Attending: Internal Medicine | Admitting: Internal Medicine

## 2019-11-22 ENCOUNTER — Other Ambulatory Visit: Payer: Self-pay

## 2019-11-22 ENCOUNTER — Telehealth (INDEPENDENT_AMBULATORY_CARE_PROVIDER_SITE_OTHER): Payer: Self-pay | Admitting: Internal Medicine

## 2019-11-22 DIAGNOSIS — R1319 Other dysphagia: Secondary | ICD-10-CM | POA: Diagnosis not present

## 2019-11-22 DIAGNOSIS — T17300A Unspecified foreign body in larynx causing asphyxiation, initial encounter: Secondary | ICD-10-CM | POA: Diagnosis not present

## 2019-11-22 NOTE — Telephone Encounter (Signed)
Please call Linus Orn at [facility] regarding patients Barium Swallow test - ph# [phone]

## 2019-11-23 NOTE — Telephone Encounter (Signed)
Linus Orn was called and report was verified.

## 2019-11-30 DIAGNOSIS — L718 Other rosacea: Secondary | ICD-10-CM | POA: Diagnosis not present

## 2019-11-30 DIAGNOSIS — Z85820 Personal history of malignant melanoma of skin: Secondary | ICD-10-CM | POA: Diagnosis not present

## 2019-11-30 DIAGNOSIS — L570 Actinic keratosis: Secondary | ICD-10-CM | POA: Diagnosis not present

## 2019-11-30 DIAGNOSIS — Z85828 Personal history of other malignant neoplasm of skin: Secondary | ICD-10-CM | POA: Diagnosis not present

## 2019-11-30 DIAGNOSIS — D692 Other nonthrombocytopenic purpura: Secondary | ICD-10-CM | POA: Diagnosis not present

## 2019-11-30 DIAGNOSIS — L821 Other seborrheic keratosis: Secondary | ICD-10-CM | POA: Diagnosis not present

## 2019-11-30 DIAGNOSIS — L820 Inflamed seborrheic keratosis: Secondary | ICD-10-CM | POA: Diagnosis not present

## 2019-11-30 DIAGNOSIS — I788 Other diseases of capillaries: Secondary | ICD-10-CM | POA: Diagnosis not present

## 2019-12-06 ENCOUNTER — Telehealth (INDEPENDENT_AMBULATORY_CARE_PROVIDER_SITE_OTHER): Payer: Self-pay | Admitting: Internal Medicine

## 2019-12-06 NOTE — Telephone Encounter (Signed)
Patient left voice mail message stating Dr Laural Golden had mentioned him having a colonoscopy and something about having speech therapy - patient would like a call back regarding these issues - ph# [phone]

## 2019-12-07 NOTE — Telephone Encounter (Signed)
Per Dr.Rehman - what issues is the patient having?  Dr.Rehman recommends that the patient see the speech pathologist prior to having the Colonoscopy. Patient was called and a detailed message was left on his voicemail with this information.   Ask that the patient call our office back and if he was in agreement to see pathologist (Speech) per Dr.Rehman we would make the referral.

## 2019-12-09 DIAGNOSIS — Z23 Encounter for immunization: Secondary | ICD-10-CM | POA: Diagnosis not present

## 2019-12-20 ENCOUNTER — Telehealth: Payer: Self-pay | Admitting: Cardiovascular Disease

## 2019-12-20 NOTE — Telephone Encounter (Signed)
Spoke with patient. Dylan York will fill prescription for metoprolol on 12/23/19. Patient verbalized understanding.

## 2019-12-20 NOTE — Telephone Encounter (Signed)
New message   Patient needs a new prescription for this medication   metoprolol succinate (TOPROL-XL) 25 MG 24 hr tablet   Sent to [place], [place]

## 2019-12-21 ENCOUNTER — Other Ambulatory Visit (INDEPENDENT_AMBULATORY_CARE_PROVIDER_SITE_OTHER): Payer: Self-pay | Admitting: *Deleted

## 2019-12-21 DIAGNOSIS — T17308A Unspecified foreign body in larynx causing other injury, initial encounter: Secondary | ICD-10-CM

## 2019-12-21 DIAGNOSIS — R059 Cough, unspecified: Secondary | ICD-10-CM

## 2019-12-21 DIAGNOSIS — R05 Cough: Secondary | ICD-10-CM

## 2019-12-21 DIAGNOSIS — R1319 Other dysphagia: Secondary | ICD-10-CM

## 2019-12-26 ENCOUNTER — Other Ambulatory Visit (HOSPITAL_COMMUNITY): Payer: Self-pay | Admitting: Specialist

## 2019-12-26 DIAGNOSIS — R1310 Dysphagia, unspecified: Secondary | ICD-10-CM

## 2019-12-26 DIAGNOSIS — R059 Cough, unspecified: Secondary | ICD-10-CM

## 2019-12-26 DIAGNOSIS — T17308A Unspecified foreign body in larynx causing other injury, initial encounter: Secondary | ICD-10-CM

## 2019-12-26 DIAGNOSIS — R05 Cough: Secondary | ICD-10-CM

## 2019-12-26 DIAGNOSIS — D649 Anemia, unspecified: Secondary | ICD-10-CM | POA: Diagnosis not present

## 2019-12-29 ENCOUNTER — Ambulatory Visit (HOSPITAL_COMMUNITY): Payer: Medicare Other | Admitting: Speech Pathology

## 2019-12-30 ENCOUNTER — Other Ambulatory Visit (HOSPITAL_COMMUNITY): Payer: Medicare Other

## 2020-01-02 ENCOUNTER — Ambulatory Visit (HOSPITAL_COMMUNITY): Payer: Medicare Other | Attending: Internal Medicine | Admitting: Speech Pathology

## 2020-01-02 ENCOUNTER — Other Ambulatory Visit: Payer: Self-pay

## 2020-01-02 ENCOUNTER — Ambulatory Visit (HOSPITAL_COMMUNITY)
Admission: RE | Admit: 2020-01-02 | Discharge: 2020-01-02 | Disposition: A | Discharge status: Home / Self Care | Payer: Medicare Other | Source: Ambulatory Visit | Attending: Internal Medicine | Admitting: Internal Medicine

## 2020-01-02 ENCOUNTER — Encounter (HOSPITAL_COMMUNITY): Payer: Self-pay | Admitting: Speech Pathology

## 2020-01-02 DIAGNOSIS — R1310 Dysphagia, unspecified: Secondary | ICD-10-CM

## 2020-01-02 DIAGNOSIS — R05 Cough: Secondary | ICD-10-CM | POA: Diagnosis not present

## 2020-01-02 DIAGNOSIS — T17308A Unspecified foreign body in larynx causing other injury, initial encounter: Secondary | ICD-10-CM | POA: Diagnosis not present

## 2020-01-02 DIAGNOSIS — R1312 Dysphagia, oropharyngeal phase: Secondary | ICD-10-CM | POA: Insufficient documentation

## 2020-01-02 DIAGNOSIS — R059 Cough, unspecified: Secondary | ICD-10-CM

## 2020-01-02 NOTE — Therapy (Signed)
[place], [place], [address] Phone: [phone]   Fax:  [phone]  Modified Barium Swallow  Patient Details  Name: Dylan York MRN: [id number] Date of Birth: 09/14/1926 No data recorded  Encounter Date: 01/02/2020  End of Session - 01/02/20 1252    Visit Number  1    Number of Visits  1    Authorization Type  Medicare    SLP Start Time  1106    SLP Stop Time   1137    SLP Time Calculation (min)  31 min    Activity Tolerance  Patient tolerated treatment well       Past Medical History:  Diagnosis Date  . Atrial tachycardia ([place])   . BPH (benign prostatic hyperplasia)   . GERD (gastroesophageal reflux disease)   . Glaucoma   . Hypertension   . Pneumonia   . Sinus bradycardia, with HR in the 40s BB stopped. 08/08/2013  . Stroke ([place])   . Thyroid disease   . Tricuspid regurgitation     Past Surgical History:  Procedure Laterality Date  . APPENDECTOMY    . BACTERIAL OVERGROWTH TEST N/A 01/14/2016   Procedure: BACTERIAL OVERGROWTH TEST;  Surgeon: Rogene Houston, MD;  Location[facility];  Service: Endoscopy;  Laterality: N/A;  730  . HERNIA REPAIR    . PROSTATE SURGERY      There were no vitals filed for this visit.  Subjective Assessment - 01/02/20 1240    Subjective  "Sometimes liquids go down the wrong way."    Special Tests  MBSS    Currently in Pain?  No/denies          General - 01/02/20 1243      General Information   Date of Onset  11/24/19    HPI  Dylan York is a 86 yo male who was referred by Dr. Laural Golden for MBSS due to aspiration noted on barium swallow on 11/22/19. He reports difficulty swallowing liquids and solids at times and indicates difficulty moving phlegm. He has not had any recent URI symptoms or PNA in the recent past. Barium swallow on 11/22/19 showed laryngeal penetration and aspiration of contrast into proximal tracheal airway without spontaneous cough.  Age-related esophageal dysmotility. No evidence of esophageal stricture.    Type of Study  MBS-Modified Barium Swallow Study    Diet Prior to this Study  Regular;Thin liquids    Temperature Spikes Noted  No    Respiratory Status  Room air    History of Recent Intubation  No    Behavior/Cognition  Alert;Cooperative;Pleasant mood    Oral Cavity Assessment  Within Functional Limits    Oral Care Completed by SLP  No    Oral Cavity - Dentition  Adequate natural dentition    Vision  Functional for self feeding    Self-Feeding Abilities  Able to feed self    Patient Positioning  Upright in chair    Baseline Vocal Quality  Normal    Volitional Cough  Strong    Volitional Swallow  Able to elicit    Anatomy  Within functional limits    Pharyngeal Secretions  Not observed secondary MBS         Oral Preparation/Oral Phase - 01/02/20 1243      Oral Preparation/Oral Phase   Oral Phase  Impaired      Oral - Thin   Oral - Thin Teaspoon  Within functional limits  Oral - Thin Cup  Decreased bolus cohesion;Piecemeal swallowing      Oral - Solids   Oral - Puree  Piecemeal swallowing    Oral - Pill  Delayed A-P transit      Electrical stimulation - Oral Phase   Was Electrical Stimulation Used  No       Pharyngeal Phase - 01/02/20 1246      Pharyngeal Phase   Pharyngeal Phase  Impaired      Pharyngeal - Thin   Pharyngeal- Thin Teaspoon  Swallow initiation at vallecula;Reduced epiglottic inversion;Reduced tongue base retraction;Pharyngeal residue - valleculae;Lateral channel residue    Pharyngeal- Thin Cup  Lateral channel residue;Pharyngeal residue - pyriform;Penetration/Aspiration during swallow;Swallow initiation at vallecula;Reduced epiglottic inversion;Reduced tongue base retraction    Pharyngeal  Material enters airway, remains ABOVE vocal cords then ejected out;Material does not enter airway    Pharyngeal- Thin Straw  Penetration/Aspiration during swallow;Swallow initiation at  vallecula;Lateral channel residue;Pharyngeal residue - pyriform    Pharyngeal  Material does not enter airway;Material enters airway, remains ABOVE vocal cords then ejected out      Pharyngeal - Solids   Pharyngeal- Puree  Swallow initiation at vallecula;Reduced epiglottic inversion;Reduced tongue base retraction;Pharyngeal residue - valleculae;Pharyngeal residue - posterior pharnyx    Pharyngeal- Regular  Delayed swallow initiation-vallecula;Reduced tongue base retraction;Reduced epiglottic inversion    Pharyngeal- Pill  --   penetration of thins while swallowing pill     Electrical Stimulation - Pharyngeal Phase   Was Electrical Stimulation Used  No       Cricopharyngeal Phase - 01/02/20 1250      Cervical Esophageal Phase   Cervical Esophageal Phase  Within functional limits        Plan - 01/02/20 1253    Clinical Impression Statement  Pt presents with mild oral phase and mild/mod pharygeal phase dysphagia characterized by reduced bolus cohesiveness orally with piecemeal deglutition, swallow trigger at the level of the valleculae with min reduced tongue base retraction and epiglottic deflection resulting in reduced pharyngeal squeeze most prominent with puree and min vallecular residue, lateral channel and pyriform residue which is reduced with repeat/dry swallows. Pt was turned AP and pharyngeal transit was noted to be predominantly on the right side with puree. Head turns L/R were implemented and were found to be inconsistent in clearing puree bolus through pharynx. Chin/head down was found to be the most effective in promoting pharyngeal bolus clearance. Suspect pharyngeal dysphagia is due to weakness and Pt was offered short term dysphagia therapy to provided swallowing exercises. Pt stated that he wanted to speak with Dr. Laural Golden first. He was given two exercises today and Pt able to return demonstrate ([facility] and [facility]) and also encouraged to take small bites/sips, swallow fast and  hard, swallow 2x for each bite/sip to clear residuals, and follow solids with liquids. Pt to continue with head down/slight chin tuck. He was also educated on the importance of oral care and maintaining a strong cough to mitigate aspiration risks. Pt was given my contact information should he decide to pursue dysphagia therapy. Recommend regular textures and thin liquids with use of strategies above.    Speech Therapy Frequency  1x /week    Duration  --   3 weeks   Treatment/Interventions  Pharyngeal strengthening exercises;Patient/family education;SLP instruction and feedback;Compensatory techniques    Potential to Achieve Goals  Good    Potential Considerations  --   Pt unsure if he wants to pursue dysphagia therapy at this time  Consulted and Agree with Plan of Care  Patient       Patient will benefit from skilled therapeutic intervention in order to improve the following deficits and impairments:   Dysphagia, oropharyngeal phase    Recommendations/Treatment - 01/02/20 1250      Swallow Evaluation Recommendations   SLP Diet Recommendations  Age appropriate regular;Thin    Liquid Administration via  Cup    Medication Administration  Whole meds with liquid    Supervision  Patient able to self feed    Compensations  Small sips/bites;Multiple dry swallows after each bite/sip;Follow solids with liquid;Effortful swallow;Chin tuck   chin down with swallow   Postural Changes  Seated upright at 90 degrees;Remain upright for at least 30 minutes after feeds/meals       Prognosis - 01/02/20 1252      Prognosis   Prognosis for Safe Diet Advancement  Good      Individuals Consulted   Consulted and Agree with Results and Recommendations  Patient    Report Sent to   Referring physician       Problem List Patient Active Problem List   Diagnosis Date Noted  . Dysphagia 11/08/2019  . Anemia, unspecified 11/08/2019  . Bilateral hand numbness 05/12/2017  . Hyperlipidemia 10/02/2016  .  Essential hypertension 10/02/2016  . Chronic diastolic heart failure ([place]) 09/11/2016  . Right homonymous hemianopsia 09/07/2016  . Cerebrovascular accident (CVA) due to embolism of left middle cerebral artery ([place]) 09/07/2016  . Chronic prostatitis 07/02/2015  . Hypothyroidism 09/26/2014  . Lower extremity edema 08/23/2013  . PAF (paroxysmal atrial fibrillation) ([place]) 08/08/2013  . Sinus bradycardia, with HR in the 40s BB stopped. 08/08/2013  . Labile hypertension 08/07/2013  . Hypertensive crisis 08/05/2013  . Anticoagulated 05/05/2013  . AF (atrial fibrillation) ([place]) 04/04/2013  . Hypertensive heart disease 04/04/2013  . Benign prostatic hyperplasia with lower urinary tract symptoms 04/04/2013  . GERD (gastroesophageal reflux disease) 04/04/2013  . Sick sinus syndrome ([place]) 04/04/2013  . Renal cyst 11/03/2011   Thank you,  Genene Churn, [place]  [facility] 01/02/2020, 12:56 PM  [place] [address] [place], [place], [address] Phone: [phone]   Fax:  [phone]  Name: Dylan York MRN: [id number] Date of Birth: [birth year]

## 2020-01-07 DIAGNOSIS — Z23 Encounter for immunization: Secondary | ICD-10-CM | POA: Diagnosis not present

## 2020-01-10 ENCOUNTER — Telehealth (INDEPENDENT_AMBULATORY_CARE_PROVIDER_SITE_OTHER): Payer: Self-pay | Admitting: *Deleted

## 2020-01-10 NOTE — Telephone Encounter (Signed)
Patient called in states he's finished his swallowing studies and wants to proceed with TCS -- please advise if ok to schedule

## 2020-01-14 NOTE — Telephone Encounter (Signed)
Can we get another CBC first.

## 2020-01-16 ENCOUNTER — Other Ambulatory Visit (INDEPENDENT_AMBULATORY_CARE_PROVIDER_SITE_OTHER): Payer: Self-pay | Admitting: *Deleted

## 2020-01-16 ENCOUNTER — Telehealth: Payer: Self-pay | Admitting: Cardiovascular Disease

## 2020-01-16 DIAGNOSIS — D649 Anemia, unspecified: Secondary | ICD-10-CM

## 2020-01-16 NOTE — Telephone Encounter (Signed)
A order for the CBC has been completed and faxed to the [facility]. Patient was called and made aware.

## 2020-01-16 NOTE — Telephone Encounter (Signed)
° °  Pt c/o medication issue:  1. Name of Medication: metoprolol succinate (TOPROL-XL) 25 MG 24 hr tablet  2. How are you currently taking this medication (dosage and times per day)?   3. Are you having a reaction (difficulty breathing--STAT)?   4. What is your medication issue? Dylan York from [facility] wanted to clarify dosage, per pt said, he takes 1 and a half tablets in the morning and 1 and a half tablets at night. If this the right dosage she needs new prescription send to them.  Please call

## 2020-01-16 NOTE — Telephone Encounter (Signed)
Call placed to the patient. He needs a refill on the Metoprolol Succinate. He stated that he is taking Metoprolol Succinate 37.5 mg twice daily.   Per Dr. Evette Georges office note on 11/21/19:  Persistent atrial fibrillation: As noted above, I will slightly titrate metoprolol succinate to 37.5 mg in the morning and 25 mg at night but if heart rate consistently stays in the 90s to 100 range further titration to 37.5 mg twice a day will be undertaken  Heart rate typically is 95-115. He stated even with the increase in the Succinate his heart rate still stays elevated. He stated that he stays tired and has to use a cane due to weakness.   Blood pressure is typically 120/80's per the patient.   He would like to know if we can refill the Succinate 37.5 mg bid or if he needs something else.

## 2020-01-17 ENCOUNTER — Other Ambulatory Visit: Payer: Self-pay | Admitting: Cardiovascular Disease

## 2020-01-17 ENCOUNTER — Telehealth: Payer: Self-pay

## 2020-01-17 MED ORDER — METOPROLOL SUCCINATE ER 25 MG PO TB24: ORAL_TABLET | ORAL | 3 refills | Status: DC

## 2020-01-17 NOTE — Telephone Encounter (Signed)
Okay to renew

## 2020-01-17 NOTE — Telephone Encounter (Signed)
Was renewed today by S.Iona Beard CMA (see Meds list)

## 2020-01-17 NOTE — Telephone Encounter (Signed)
Metoprolol succinate prescription refill sent to pharmacy with dose increase noted.

## 2020-01-18 NOTE — Telephone Encounter (Signed)
Medication refilled with noted dose increase 01/17/20. Called and spoke with pt today to notify that this had been corrected and sent into his pharmacy. Reminded pt of his upcoming appt with Dr.Kelly on 02/15/20 at 2 pm in office. Pt verbalized understanding.No other questions or concerns at this time.

## 2020-01-19 DIAGNOSIS — D649 Anemia, unspecified: Secondary | ICD-10-CM | POA: Diagnosis not present

## 2020-01-20 LAB — CBC
Hematocrit: 36.2 % — ABNORMAL LOW (ref 37.5–51.0)
Hemoglobin: 11 g/dL — ABNORMAL LOW (ref 13.0–17.7)
MCH: 28 pg (ref 26.6–33.0)
MCHC: 30.4 g/dL — ABNORMAL LOW (ref 31.5–35.7)
MCV: 92 fL (ref 79–97)
Platelets: 186 10*3/uL (ref 150–450)
RBC: 3.93 x10E6/uL — ABNORMAL LOW (ref 4.14–5.80)
RDW: 15.3 % (ref 11.6–15.4)
WBC: 4.9 10*3/uL (ref 3.4–10.8)

## 2020-01-23 ENCOUNTER — Telehealth (INDEPENDENT_AMBULATORY_CARE_PROVIDER_SITE_OTHER): Payer: Self-pay | Admitting: *Deleted

## 2020-01-23 NOTE — Telephone Encounter (Signed)
Patient left a voicemail this morning - he stated that he felt this was a emergency. Patient went to the bathroom earlier to urinate, he need and this was normal. He was wakling away from the toilet a gush of bright red blood passed from his anus not his penis , and says it was not a BM it was bright red blood and a lot of it. This was a first event and he has not had any since this time.  Patient states that he has been taking Eliquis , positive hemoccult card ,th PCP and is taking 5 mg twice a day .  He is also taking Ferrous Sulfate 325 mg daily. Since starting this he states that his stools have been a blackish gray in color.  Stool last night ws a brown reddish in color.  The last two weeks he has had BM's (good) in the morning , 3-4 hours later he will pass squiggly pieces of stool, then toward evening he has diarrhea. Patient considers this a change in his stool.  He had CBC drawn 01/19/20 at which time his HGB was 11.0. The positive stool was from his PCP per the patient.  Patient states he currently feels okay. He was told that Dr.Rehman is off today and if he felt lightheaded , shortness of breathe , dizziness , ect to report to the ED. He could also contact his PCP , patient states that he does not want to do this .

## 2020-01-24 ENCOUNTER — Telehealth (INDEPENDENT_AMBULATORY_CARE_PROVIDER_SITE_OTHER): Payer: Self-pay | Admitting: *Deleted

## 2020-01-24 NOTE — Telephone Encounter (Signed)
Note was made on the other encounter.

## 2020-01-24 NOTE — Telephone Encounter (Signed)
Addressed with Dr.Rehman - he would like to precede with a TCS. Patient made aware.  Forwarded o Lacretia Nicks , Referral Coordinator to arrange and contact patient.

## 2020-01-25 ENCOUNTER — Other Ambulatory Visit (INDEPENDENT_AMBULATORY_CARE_PROVIDER_SITE_OTHER): Payer: Self-pay | Admitting: *Deleted

## 2020-01-25 ENCOUNTER — Telehealth: Payer: Self-pay | Admitting: Cardiovascular Disease

## 2020-01-25 ENCOUNTER — Encounter (INDEPENDENT_AMBULATORY_CARE_PROVIDER_SITE_OTHER): Payer: Self-pay | Admitting: *Deleted

## 2020-01-25 NOTE — Telephone Encounter (Signed)
Patient with diagnosis of atrial fibrillation on Eliquisc for anticoagulation.    Procedure: colonoscopy Date of procedure: 02/09/20  CHADS2-VASc score of  6 (CHF, HTN, AGE x 2, stroke/tia x 2)  CrCl 37.9 Platelet count 186  Recommend that he hold Eliquis x 1 day for colonoscopy, in light of having previous embolic stroke (123456).  Will review with Dr. Claiborne Billings

## 2020-01-25 NOTE — Telephone Encounter (Signed)
   [facility] Pre-operative Risk Assessment    Request for surgical clearance:  1. What type of surgery is being performed? Colonoscopy   2. When is this surgery scheduled? 02/09/20  3. What type of clearance is required (medical clearance vs. Pharmacy clearance to hold med vs. Both)? Pharmacy   4. Are there any medications that need to be held prior to surgery and how long? Eliquis, 2 days prior   5. Practice name and name of physician performing surgery? [facility]  Dr. Laural Golden  6. What is your office phone number [phone]   7.   What is your office fax number [phone]  8.   Anesthesia type (None, local, MAC, general) ? Moderate   Dylan York 01/25/2020, 2:49 PM  _________________________________________________________________   (provider comments below)

## 2020-01-25 NOTE — Telephone Encounter (Signed)
TCS sch'd 02/09/20, patient aware, instructions mailed

## 2020-01-26 ENCOUNTER — Other Ambulatory Visit (INDEPENDENT_AMBULATORY_CARE_PROVIDER_SITE_OTHER): Payer: Self-pay | Admitting: *Deleted

## 2020-01-26 DIAGNOSIS — K625 Hemorrhage of anus and rectum: Secondary | ICD-10-CM

## 2020-01-26 DIAGNOSIS — R195 Other fecal abnormalities: Secondary | ICD-10-CM

## 2020-01-26 NOTE — Telephone Encounter (Signed)
Reviewed with Dr. Claiborne Billings via phone.  Agreeable to hold Eliquis x 36 hours (3 doses) prior to colonoscopy.  Please advise GI to resume Eliquis evening of or day after at their discretion.

## 2020-01-26 NOTE — Telephone Encounter (Signed)
   Primary Cardiologist: Shelva Majestic, MD  Chart reviewed as part of pre-operative protocol coverage.  Patient with diagnosis of atrial fibrillation on Eliquis for anticoagulation.  Hx of previous embolic stroke (123456).    Procedure: colonoscopy Date of procedure: 02/09/20  CHADS2-VASc score of  6 (CHF, HTN, AGE x 2, stroke/tia x 2)  CrCl 37.9 Platelet count 186  Per discussion between our pharmacist and Dr. Claiborne Billings, agreeable to hold Eliquis for 36 hours (3 doses) prior to colonoscopy. Please resume Eliquis on the evening of or day after the procedure at the discretion of GI.    I will route this recommendation to the requesting party via Epic fax function and remove from pre-op pool.  Please call with questions.  Daune Perch, NP 01/26/2020, 1:37 PM

## 2020-01-31 ENCOUNTER — Telehealth (INDEPENDENT_AMBULATORY_CARE_PROVIDER_SITE_OTHER): Payer: Self-pay | Admitting: *Deleted

## 2020-01-31 MED ORDER — PLENVU 140 G PO SOLR: 1.0000 | Freq: Once | ORAL | 0 refills | Status: AC

## 2020-01-31 NOTE — Telephone Encounter (Signed)
Patient needs plenvu(copay card)  

## 2020-02-07 ENCOUNTER — Other Ambulatory Visit: Payer: Self-pay

## 2020-02-07 ENCOUNTER — Other Ambulatory Visit (HOSPITAL_COMMUNITY)
Admission: RE | Admit: 2020-02-07 | Discharge: 2020-02-07 | Disposition: A | Discharge status: Home / Self Care | Payer: Medicare Other | Source: Ambulatory Visit | Attending: Internal Medicine | Admitting: Internal Medicine

## 2020-02-07 DIAGNOSIS — Z20822 Contact with and (suspected) exposure to covid-19: Secondary | ICD-10-CM | POA: Insufficient documentation

## 2020-02-07 DIAGNOSIS — Z01812 Encounter for preprocedural laboratory examination: Secondary | ICD-10-CM | POA: Diagnosis not present

## 2020-02-08 LAB — SARS CORONAVIRUS 2 (TAT 6-24 HRS): SARS Coronavirus 2: NEGATIVE

## 2020-02-09 ENCOUNTER — Other Ambulatory Visit: Payer: Self-pay

## 2020-02-09 ENCOUNTER — Ambulatory Visit (HOSPITAL_COMMUNITY)
Admission: RE | Admit: 2020-02-09 | Discharge: 2020-02-09 | Disposition: A | Discharge status: Home / Self Care | Payer: Medicare Other | Attending: Internal Medicine | Admitting: Internal Medicine

## 2020-02-09 ENCOUNTER — Encounter (HOSPITAL_COMMUNITY): Payer: Self-pay | Admitting: Internal Medicine

## 2020-02-09 ENCOUNTER — Encounter (HOSPITAL_COMMUNITY)
Admission: RE | Disposition: A | Discharge status: Home / Self Care | Payer: Self-pay | Source: Home / Self Care | Attending: Internal Medicine

## 2020-02-09 DIAGNOSIS — K625 Hemorrhage of anus and rectum: Secondary | ICD-10-CM | POA: Diagnosis not present

## 2020-02-09 DIAGNOSIS — E079 Disorder of thyroid, unspecified: Secondary | ICD-10-CM | POA: Insufficient documentation

## 2020-02-09 DIAGNOSIS — Z8249 Family history of ischemic heart disease and other diseases of the circulatory system: Secondary | ICD-10-CM | POA: Insufficient documentation

## 2020-02-09 DIAGNOSIS — I071 Rheumatic tricuspid insufficiency: Secondary | ICD-10-CM | POA: Insufficient documentation

## 2020-02-09 DIAGNOSIS — Z87891 Personal history of nicotine dependence: Secondary | ICD-10-CM | POA: Insufficient documentation

## 2020-02-09 DIAGNOSIS — N400 Enlarged prostate without lower urinary tract symptoms: Secondary | ICD-10-CM | POA: Insufficient documentation

## 2020-02-09 DIAGNOSIS — I10 Essential (primary) hypertension: Secondary | ICD-10-CM | POA: Insufficient documentation

## 2020-02-09 DIAGNOSIS — R195 Other fecal abnormalities: Secondary | ICD-10-CM

## 2020-02-09 DIAGNOSIS — K5730 Diverticulosis of large intestine without perforation or abscess without bleeding: Secondary | ICD-10-CM | POA: Diagnosis not present

## 2020-02-09 DIAGNOSIS — Z8673 Personal history of transient ischemic attack (TIA), and cerebral infarction without residual deficits: Secondary | ICD-10-CM | POA: Diagnosis not present

## 2020-02-09 DIAGNOSIS — R001 Bradycardia, unspecified: Secondary | ICD-10-CM | POA: Insufficient documentation

## 2020-02-09 DIAGNOSIS — K621 Rectal polyp: Secondary | ICD-10-CM | POA: Diagnosis not present

## 2020-02-09 DIAGNOSIS — K219 Gastro-esophageal reflux disease without esophagitis: Secondary | ICD-10-CM | POA: Diagnosis not present

## 2020-02-09 DIAGNOSIS — K644 Residual hemorrhoidal skin tags: Secondary | ICD-10-CM | POA: Insufficient documentation

## 2020-02-09 DIAGNOSIS — I4891 Unspecified atrial fibrillation: Secondary | ICD-10-CM | POA: Insufficient documentation

## 2020-02-09 DIAGNOSIS — Z809 Family history of malignant neoplasm, unspecified: Secondary | ICD-10-CM | POA: Diagnosis not present

## 2020-02-09 DIAGNOSIS — Z8042 Family history of malignant neoplasm of prostate: Secondary | ICD-10-CM | POA: Diagnosis not present

## 2020-02-09 DIAGNOSIS — D128 Benign neoplasm of rectum: Secondary | ICD-10-CM | POA: Diagnosis not present

## 2020-02-09 DIAGNOSIS — H409 Unspecified glaucoma: Secondary | ICD-10-CM | POA: Insufficient documentation

## 2020-02-09 DIAGNOSIS — Z79899 Other long term (current) drug therapy: Secondary | ICD-10-CM | POA: Diagnosis not present

## 2020-02-09 DIAGNOSIS — Z888 Allergy status to other drugs, medicaments and biological substances status: Secondary | ICD-10-CM | POA: Insufficient documentation

## 2020-02-09 DIAGNOSIS — C185 Malignant neoplasm of splenic flexure: Secondary | ICD-10-CM | POA: Diagnosis not present

## 2020-02-09 DIAGNOSIS — Z7901 Long term (current) use of anticoagulants: Secondary | ICD-10-CM | POA: Insufficient documentation

## 2020-02-09 HISTORY — PX: COLONOSCOPY: SHX5424

## 2020-02-09 HISTORY — PX: BIOPSY: SHX5522

## 2020-02-09 HISTORY — PX: POLYPECTOMY: SHX5525

## 2020-02-09 LAB — HEMOGLOBIN AND HEMATOCRIT, BLOOD
HCT: 32.6 % — ABNORMAL LOW (ref 39.0–52.0)
Hemoglobin: 10 g/dL — ABNORMAL LOW (ref 13.0–17.0)

## 2020-02-09 SURGERY — COLONOSCOPY
Anesthesia: Moderate Sedation

## 2020-02-09 MED ORDER — MEPERIDINE HCL 50 MG/ML IJ SOLN
INTRAMUSCULAR | Status: DC | PRN
  Administered 2020-02-09 (×2): 15 mg via INTRAVENOUS

## 2020-02-09 MED ORDER — MEPERIDINE HCL 50 MG/ML IJ SOLN
INTRAMUSCULAR | Status: AC
  Filled 2020-02-09: qty 1

## 2020-02-09 MED ORDER — SODIUM CHLORIDE 0.9 % IV SOLN: INTRAVENOUS | Status: DC

## 2020-02-09 MED ORDER — MIDAZOLAM HCL 5 MG/5ML IJ SOLN
INTRAMUSCULAR | Status: DC | PRN
  Administered 2020-02-09 (×2): 1 mg via INTRAVENOUS

## 2020-02-09 MED ORDER — STERILE WATER FOR IRRIGATION IR SOLN
Status: DC | PRN
  Administered 2020-02-09: 11:00:00 100 mL

## 2020-02-09 MED ORDER — MIDAZOLAM HCL 5 MG/5ML IJ SOLN
INTRAMUSCULAR | Status: AC
  Filled 2020-02-09: qty 10

## 2020-02-09 NOTE — H&P (Signed)
Dylan York is an 86 y.o. male.   Chief Complaint: Patient is here for colonoscopy. HPI: Patient is 86 year old Caucasian male who has history of atrial fibrillation and is on Eliquis who experienced large-volume hematochezia about over 2 weeks ago.  He does not recall having had diarrhea constipation or abdominal pain.  He has not passed any blood since then.  Of stool was noted to be heme positive.  He states he has lost 7 pounds with the prep but he had diarrhea the day before he started the prep.  He has not experienced fever chills nausea or vomiting.  Last colonoscopy was over 10 or 15 years ago by Dr. Wannetta Sender of [facility]. Last Eliquis dose was 3 days ago. Family history is negative for CRC.  Past Medical History:  Diagnosis Date  . Atrial tachycardia ([place])   . BPH (benign prostatic hyperplasia)   . GERD (gastroesophageal reflux disease)   . Glaucoma   . Hypertension   . Pneumonia   . Sinus bradycardia, with HR in the 40s BB stopped. 08/08/2013  . Stroke ([place])   . Thyroid disease   . Tricuspid regurgitation     Past Surgical History:  Procedure Laterality Date  . APPENDECTOMY    . BACTERIAL OVERGROWTH TEST N/A 01/14/2016   Procedure: BACTERIAL OVERGROWTH TEST;  Surgeon: Rogene Houston, MD;  Location: [facility];  Service: Endoscopy;  Laterality: N/A;  730  . HERNIA REPAIR    . PROSTATE SURGERY      Family History  Problem Relation Age of Onset  . Cancer Mother   . Heart Problems Father        atherosclerosis  . Cancer - Prostate Paternal Grandfather    Social History:  reports that he quit smoking about 43 years ago. He has never used smokeless tobacco. He reports that he does not drink alcohol or use drugs.  Allergies:  Allergies  Allergen Reactions  . Amiodarone Other (See Comments)    Not good for liver  . Multaq [Dronedarone] Shortness Of Breath    Medications Prior to Admission  Medication Sig Dispense Refill  . apixaban (ELIQUIS) 5  MG TABS tablet Take 1 tablet (5 mg total) by mouth 2 (two) times daily. 60 tablet 6  . cetaphil (CETAPHIL) lotion Apply 1 application topically as needed for dry skin (forehead).    . COMBIGAN 0.2-0.5 % ophthalmic solution Place 1 drop into both eyes 2 (two) times daily.    Marland Kitchen docusate sodium (COLACE) 100 MG capsule Take 2 capsules (200 mg total) by mouth at bedtime. (Patient taking differently: Take 200 mg by mouth daily as needed for mild constipation. ) 60 capsule 0  . ferrous sulfate 325 (65 FE) MG tablet Take 325 mg by mouth daily with breakfast.    . finasteride (PROSCAR) 5 MG tablet Take 5 mg by mouth daily.    Marland Kitchen levothyroxine (SYNTHROID, LEVOTHROID) 75 MCG tablet Take 75 mcg by mouth daily before breakfast.     . LORazepam (ATIVAN) 1 MG tablet Take 0.5 mg by mouth at bedtime as needed.   2  . metoprolol succinate (TOPROL-XL) 25 MG 24 hr tablet TAKE ONE & 1/2 TABLETS BY MOUTH TWICE A DAY (37.5 MG TWICE A DAY) (Patient taking differently: Take 3,735 mg by mouth in the morning and at bedtime. ) 225 tablet 3  . Multiple Vitamins-Minerals (CENTRUM SILVER PO) Take by mouth daily.    . Multiple Vitamins-Minerals (MULTIVITAMIN PO) Take 1 tablet  by mouth daily. Men 33 +    . Simethicone (PHAZYME) 180 MG CAPS Take 180 mg by mouth daily as needed (GAS).     Marland Kitchen tamsulosin (FLOMAX) 0.4 MG CAPS capsule TAKE ONE CAPSULE BY MOUTH DAILY (Patient taking differently: Take 0.4 mg by mouth at bedtime. ) 30 capsule 6  . Vitamin D, Cholecalciferol, 50 MCG (2000 UT) CAPS Take 2,000 Units by mouth daily.       No results found for this or any previous visit (from the past 48 hour(s)). No results found.  Review of Systems  Blood pressure (!) 161/107, pulse (!) 123, temperature 97.6 F (36.4 C), temperature source Oral, resp. rate (!) 22, height 5\' 8"  (1.727 m), weight 60.8 kg, SpO2 97 %. Physical Exam  Constitutional:  Well-developed thin Caucasian male in NAD.  HENT:  Mouth/Throat: Oropharynx is clear and  moist.  Eyes: Conjunctivae are normal. No scleral icterus.  Neck: No thyromegaly present.  Cardiovascular:  Irregular rhythm normal S1 and S2.  No murmur gallop noted.  Respiratory: Effort normal and breath sounds normal.  GI: Soft. He exhibits no distension and no mass. There is no abdominal tenderness.  Lymphadenopathy:    He has no cervical adenopathy.  Neurological: He is alert.  Skin: Skin is warm and dry.     Assessment/Plan Rectal bleeding. Diagnostic colonoscopy.  Hildred Laser, MD 02/09/2020, 10:52 AM

## 2020-02-09 NOTE — Discharge Instructions (Signed)
Resume Eliquis on 02/11/2020 Resume other medications as before. Resume usual diet. No driving for 24 hours. Physician will call with biopsy results.    Colonoscopy, Adult, Care After This sheet gives you information about how to care for yourself after your procedure. Your doctor may also give you more specific instructions. If you have problems or questions, call your doctor. What can I expect after the procedure? After the procedure, it is common to have:  A small amount of blood in your poop (stool) for 24 hours.  Some gas.  Mild cramping or bloating in your belly (abdomen). Follow these instructions at home: Eating and drinking   Drink enough fluid to keep your pee (urine) pale yellow.  Follow instructions from your doctor about what you cannot eat or drink.  Return to your normal diet as told by your doctor. Avoid heavy or fried foods that are hard to digest. Activity  Rest as told by your doctor.  Do not sit for a long time without moving. Get up to take short walks every 1-2 hours. This is important. Ask for help if you feel weak or unsteady.  Return to your normal activities as told by your doctor. Ask your doctor what activities are safe for you. To help cramping and bloating:   Try walking around.  Put heat on your belly as told by your doctor. Use the heat source that your doctor recommends, such as a moist heat pack or a heating pad. ? Put a towel between your skin and the heat source. ? Leave the heat on for 20-30 minutes. ? Remove the heat if your skin turns bright red. This is very important if you are unable to feel pain, heat, or cold. You may have a greater risk of getting burned. General instructions  For the first 24 hours after the procedure: ? Do not drive or use machinery. ? Do not sign important documents. ? Do not drink alcohol. ? Do your daily activities more slowly than normal. ? Eat foods that are soft and easy to digest.  Take  over-the-counter or prescription medicines only as told by your doctor.  Keep all follow-up visits as told by your doctor. This is important. Contact a doctor if:  You have blood in your poop 2-3 days after the procedure. Get help right away if:  You have more than a small amount of blood in your poop.  You see large clumps of tissue (blood clots) in your poop.  Your belly is swollen.  You feel like you may vomit (nauseous).  You vomit.  You have a fever.  You have belly pain that gets worse, and medicine does not help your pain. Summary  After the procedure, it is common to have a small amount of blood in your poop. You may also have mild cramping and bloating in your belly.  For the first 24 hours after the procedure, do not drive or use machinery, do not sign important documents, and do not drink alcohol.  Get help right away if you have a lot of blood in your poop, feel like you may vomit, have a fever, or have more belly pain. This information is not intended to replace advice given to you by your health care provider. Make sure you discuss any questions you have with your health care provider. Document Revised: 05/16/2019 Document Reviewed: 05/16/2019 Elsevier Patient Education  [place].  Colon Polyps  Polyps are tissue growths inside the body. Polyps can grow  in many places, including the large intestine (colon). A polyp may be a round bump or a mushroom-shaped growth. You could have one polyp or several. Most colon polyps are noncancerous (benign). However, some colon polyps can become cancerous over time. Finding and removing the polyps early can help prevent this. What are the causes? The exact cause of colon polyps is not known. What increases the risk? You are more likely to develop this condition if you:  Have a family history of colon cancer or colon polyps.  Are older than 18 or older than 45 if you are African American.  Have inflammatory bowel  disease, such as ulcerative colitis or Crohn's disease.  Have certain hereditary conditions, such as: ? Familial adenomatous polyposis. ? Lynch syndrome. ? Turcot syndrome. ? Peutz-Jeghers syndrome.  Are overweight.  Smoke cigarettes.  Do not get enough exercise.  Drink too much alcohol.  Eat a diet that is high in fat and red meat and low in fiber.  Had childhood cancer that was treated with abdominal radiation. What are the signs or symptoms? Most polyps do not cause symptoms. If you have symptoms, they may include:  Blood coming from your rectum when having a bowel movement.  Blood in your stool. The stool may look dark red or black.  Abdominal pain.  A change in bowel habits, such as constipation or diarrhea. How is this diagnosed? This condition is diagnosed with a colonoscopy. This is a procedure in which a lighted, flexible scope is inserted into the anus and then passed into the colon to examine the area. Polyps are sometimes found when a colonoscopy is done as part of routine cancer screening tests. How is this treated? Treatment for this condition involves removing any polyps that are found. Most polyps can be removed during a colonoscopy. Those polyps will then be tested for cancer. Additional treatment may be needed depending on the results of testing. Follow these instructions at home: Lifestyle  Maintain a healthy weight, or lose weight if recommended by your health care provider.  Exercise every day or as told by your health care provider.  Do not use any products that contain nicotine or tobacco, such as cigarettes and e-cigarettes. If you need help quitting, ask your health care provider.  If you drink alcohol, limit how much you have: ? 0-1 drink a day for women. ? 0-2 drinks a day for men.  Be aware of how much alcohol is in your drink. In the [place] one drink equals one 12 oz bottle of beer (355 mL), one 5 oz glass of wine (148 mL), or one 1 oz shot  of hard liquor (44 mL). Eating and drinking   Eat foods that are high in fiber, such as fruits, vegetables, and whole grains.  Eat foods that are high in calcium and vitamin D, such as milk, cheese, yogurt, eggs, liver, fish, and broccoli.  Limit foods that are high in fat, such as fried foods and desserts.  Limit the amount of red meat and processed meat you eat, such as hot dogs, sausage, bacon, and lunch meats. General instructions  Keep all follow-up visits as told by your health care provider. This is important. ? This includes having regularly scheduled colonoscopies. ? Talk to your health care provider about when you need a colonoscopy. Contact a health care provider if:  You have new or worsening bleeding during a bowel movement.  You have new or increased blood in your stool.  You have a  change in bowel habits.  You lose weight for no known reason. Summary  Polyps are tissue growths inside the body. Polyps can grow in many places, including the colon.  Most colon polyps are noncancerous (benign), but some can become cancerous over time.  This condition is diagnosed with a colonoscopy.  Treatment for this condition involves removing any polyps that are found. Most polyps can be removed during a colonoscopy. This information is not intended to replace advice given to you by your health care provider. Make sure you discuss any questions you have with your health care provider. Document Revised: 02/04/2018 Document Reviewed: 02/04/2018 Elsevier Patient Education  [place].

## 2020-02-09 NOTE — Op Note (Signed)
[facility] Patient Name: Dylan York Procedure Date: 02/09/2020 10:51 AM MRN: [id number] Date of Birth: January 06, 1926 Attending MD: Hildred Laser , MD CSN: [id number] Age: 86 Admit Type: Outpatient Procedure:                Colonoscopy Indications:              Rectal bleeding Providers:                Hildred Laser, MD, Rosina Lowenstein, RN, Aram Candela Referring MD:             Glenda Chroman Medicines:                Meperidine 30 mg IV, Midazolam 2 mg IV Complications:            No immediate complications. Estimated Blood Loss:     Estimated blood loss was minimal. Procedure:                Pre-Anesthesia Assessment:                           - Prior to the procedure, a History and Physical                            was performed, and patient medications and                            allergies were reviewed. The patient's tolerance of                            previous anesthesia was also reviewed. The risks                            and benefits of the procedure and the sedation                            options and risks were discussed with the patient.                            All questions were answered, and informed consent                            was obtained. Prior Anticoagulants: The patient                            last took Eliquis (apixaban) 3 days prior to the                            procedure. ASA Grade Assessment: III - A patient                            with severe systemic disease. After reviewing the                            risks and benefits, the patient was deemed in  satisfactory condition to undergo the procedure.                           After obtaining informed consent, the colonoscope                            was passed under direct vision. Throughout the                            procedure, the patient's blood pressure, pulse, and                            oxygen saturations were monitored continuously.  The                            [id number]-[id number]) scope was introduced through                            the anus and advanced to the the cecum, identified                            by appendiceal orifice and ileocecal valve. The                            colonoscopy was performed without difficulty. The                            patient tolerated the procedure well. The quality                            of the bowel preparation was excellent. The                            ileocecal valve, appendiceal orifice, and rectum                            were photographed. Scope In: 11:03:20 AM Scope Out: 11:24:31 AM Scope Withdrawal Time: 0 hours 12 minutes 23 seconds  Total Procedure Duration: 0 hours 21 minutes 11 seconds  Findings:      The perianal and digital rectal examinations were normal.      A polypoid and ulcerated non-obstructing large mass was found at the       splenic flexure. The mass was circumferential. The mass measured four cm       in length. No bleeding was present. This was biopsied with a cold       forceps for histology. The pathology specimen was placed into Bottle       Number 1.      A few diverticula were found in the sigmoid colon.      A 4 mm polyp was found in the rectum. The polyp was removed with a cold       snare. Resection and retrieval were complete. The pathology specimen was       placed into Bottle Number 2.      External hemorrhoids were found during retroflexion. The hemorrhoids  were small. Impression:               - Malignant tumor at the splenic flexure. Biopsied.                           - Diverticulosis in the sigmoid colon.                           - One 4 mm polyp in the rectum, removed with a cold                            snare. Resected and retrieved.                           - External hemorrhoids. Moderate Sedation:      Moderate (conscious) sedation was administered by the endoscopy nurse       and supervised by the  endoscopist. The following parameters were       monitored: oxygen saturation, heart rate, blood pressure, CO2       capnography and response to care. Total physician intraservice time was       27 minutes. Recommendation:           - Patient has a contact number available for                            emergencies. The signs and symptoms of potential                            delayed complications were discussed with the                            patient. Return to normal activities tomorrow.                            Written discharge instructions were provided to the                            patient.                           - Resume previous diet today.                           - Continue present medications.                           - Resume Eliquis (apixaban) at prior dose in 1 day.                           - check H/H and CEA today.                           - Await pathology results. Procedure Code(s):        --- Professional ---  [phone], Colonoscopy, flexible; with removal of                            tumor(s), polyp(s), or other lesion(s) by snare                            technique                           45380, 59, Colonoscopy, flexible; with biopsy,                            single or multiple                           99153, Moderate sedation; each additional 15                            minutes intraservice time                           G0500, Moderate sedation services provided by the                            same physician or other qualified health care                            professional performing a gastrointestinal                            endoscopic service that sedation supports,                            requiring the presence of an independent trained                            observer to assist in the monitoring of the                            patient's level of consciousness and physiological                             status; initial 15 minutes of intra-service time;                            patient age 33 years or older (additional time may                            be reported with [phone], as appropriate) Diagnosis Code(s):        --- Professional ---                           C18.5, Malignant neoplasm of splenic flexure                           K64.4, Residual  hemorrhoidal skin tags                           K62.1, Rectal polyp                           K62.5, Hemorrhage of anus and rectum                           K57.30, Diverticulosis of large intestine without                            perforation or abscess without bleeding CPT copyright 2019 [employer]. All rights reserved. The codes documented in this report are preliminary and upon coder review may  be revised to meet current compliance requirements. Hildred Laser, MD Hildred Laser, MD 02/09/2020 11:43:37 AM This report has been signed electronically. Number of Addenda: 0

## 2020-02-10 LAB — CEA: CEA: 2.5 ng/mL (ref 0.0–4.7)

## 2020-02-13 ENCOUNTER — Other Ambulatory Visit (INDEPENDENT_AMBULATORY_CARE_PROVIDER_SITE_OTHER): Payer: Self-pay | Admitting: *Deleted

## 2020-02-13 DIAGNOSIS — Z01812 Encounter for preprocedural laboratory examination: Secondary | ICD-10-CM

## 2020-02-13 DIAGNOSIS — C185 Malignant neoplasm of splenic flexure: Secondary | ICD-10-CM

## 2020-02-13 NOTE — Progress Notes (Signed)
Done

## 2020-02-14 DIAGNOSIS — Z01812 Encounter for preprocedural laboratory examination: Secondary | ICD-10-CM | POA: Diagnosis not present

## 2020-02-14 LAB — SURGICAL PATHOLOGY

## 2020-02-15 ENCOUNTER — Ambulatory Visit (INDEPENDENT_AMBULATORY_CARE_PROVIDER_SITE_OTHER): Payer: Medicare Other | Admitting: Cardiovascular Disease

## 2020-02-15 ENCOUNTER — Other Ambulatory Visit: Payer: Self-pay

## 2020-02-15 ENCOUNTER — Encounter: Payer: Self-pay | Admitting: Cardiovascular Disease

## 2020-02-15 VITALS — BP 154/94 | HR 104 | Ht 68.0 in | Wt 141.2 lb

## 2020-02-15 DIAGNOSIS — I10 Essential (primary) hypertension: Secondary | ICD-10-CM

## 2020-02-15 DIAGNOSIS — Z7901 Long term (current) use of anticoagulants: Secondary | ICD-10-CM | POA: Diagnosis not present

## 2020-02-15 DIAGNOSIS — I4811 Longstanding persistent atrial fibrillation: Secondary | ICD-10-CM

## 2020-02-15 DIAGNOSIS — D508 Other iron deficiency anemias: Secondary | ICD-10-CM

## 2020-02-15 DIAGNOSIS — C185 Malignant neoplasm of splenic flexure: Secondary | ICD-10-CM | POA: Diagnosis not present

## 2020-02-15 DIAGNOSIS — E039 Hypothyroidism, unspecified: Secondary | ICD-10-CM | POA: Diagnosis not present

## 2020-02-15 DIAGNOSIS — Z01818 Encounter for other preprocedural examination: Secondary | ICD-10-CM | POA: Diagnosis not present

## 2020-02-15 LAB — CREATININE, SERUM
Creatinine, Ser: 1.15 mg/dL (ref 0.76–1.27)
GFR calc Af Amer: 64 mL/min/{1.73_m2} (ref >59)
GFR calc non Af Amer: 55 mL/min/{1.73_m2} — ABNORMAL LOW (ref >59)

## 2020-02-15 MED ORDER — METOPROLOL SUCCINATE ER 50 MG PO TB24: ORAL_TABLET | ORAL | 3 refills | Status: DC

## 2020-02-15 NOTE — Patient Instructions (Signed)
Medication Instructions:  INCREASE YOUR METOPROLOL SUCCINATE TO 50MG  TWICE A DAY *If you need a refill on your cardiac medications before your next appointment, please call your pharmacy*   Testing/Procedures: NEXT WEEK Your physician has requested that you have an echocardiogram. Echocardiography is a painless test that uses sound waves to create images of your heart. It provides your doctor with information about the size and shape of your heart and how well your heart's chambers and valves are working. This procedure takes approximately one hour. There are no restrictions for this procedure.  [place]  Follow-Up: At [facility], you and your health needs are our priority.  As part of our continuing mission to provide you with exceptional heart care, we have created designated Provider Care Teams.  These Care Teams include your primary Cardiologist (physician) and Advanced Practice Providers (APPs -  Physician Assistants and Nurse Practitioners) who all work together to provide you with the care you need, when you need it.  We recommend signing up for the patient portal called "MyChart".  Sign up information is provided on this After Visit Summary.  MyChart is used to connect with patients for Virtual Visits (Telemedicine).  Patients are able to view lab/test results, encounter notes, upcoming appointments, etc.  Non-urgent messages can be sent to your provider as well.   To learn more about what you can do with MyChart, go to [email or web address].    Your next appointment:   POST SURGERY   Other Instructions HOLD ELIQUIS A MINIMUM OF 2 DAYS PRIOR TO SURGERY

## 2020-02-16 ENCOUNTER — Ambulatory Visit (HOSPITAL_COMMUNITY)
Admission: RE | Admit: 2020-02-16 | Discharge: 2020-02-16 | Disposition: A | Discharge status: Home / Self Care | Payer: Medicare Other | Source: Ambulatory Visit | Attending: Internal Medicine | Admitting: Internal Medicine

## 2020-02-16 DIAGNOSIS — C189 Malignant neoplasm of colon, unspecified: Secondary | ICD-10-CM | POA: Diagnosis not present

## 2020-02-16 DIAGNOSIS — C185 Malignant neoplasm of splenic flexure: Secondary | ICD-10-CM | POA: Insufficient documentation

## 2020-02-16 MED ORDER — IOHEXOL 300 MG/ML  SOLN
100.0000 mL | Freq: Once | INTRAMUSCULAR | Status: AC | PRN
  Administered 2020-02-16: 15:00:00 100 mL via INTRAVENOUS

## 2020-02-17 ENCOUNTER — Encounter: Payer: Self-pay | Admitting: Cardiovascular Disease

## 2020-02-17 NOTE — Progress Notes (Signed)
Patient ID: KATHAN KIRKER, male   DOB: [birth year], 86 y.o.   MRN: [id number]     HPI: Dylan York is a 86 y.o. male who presents to the office for a 3 month  followup cardiology evaluation.  Mr. Durio has a history of sick sinus syndrome with atrial tachycardia as well as sinus bradycardia, ventricular bigeminy, PVCs, PAF and difficult to control hypertension  He had stabilized with Bystolic but when he was seen on 03/08/2013 he was in atrial fibrillation of questionable duration. At that time I increased his Bystolic and his started anticoagulation with Eliquis 2.5 mg twice a day. An echo Doppler study on 03/30/2013 revealed normal systolic function with an ejection fraction of 60-65%, aortic valve sclerosis, biatrial enlargement, as well as mild pulmonary hypertension with a PA pressure of 41 mm. On 04/04/2013 AF 77 rate was beats per minute. He was initially started  on Multaq 400 mg twice a day but did not tolerate this and ultimately discontinue its use.  When  l saw him on 04/26/2013 I started him on amiodarone 200 mg daily. He has tolerated this well. 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  On 05/27/2013 I further titrated his amiodarone to 200 mg twice a day. I also recommended that he reduce his Bystolic from 10 to 5 mg with a plan to do a cardioversion but as part of his preoperative assessment he was found to have pneumonia. His  pneumonia was treated with antibiotic therapy by Dr. Woody Seller in [place]. On 08/05/2013 he was seen in the office as an add-on at which time he had significant accelerated hypertension with a blood pressure of 220/100. He was seen by [name] and admitted to [facility] hospital. He  was treated with IV Cardene and he converted to sinus rhythm. Ultimately nicardipine was discontinued and amlodipine was started as well as hydralazine. He was discharged home on 08/09/2013.  His heart rhythm has been fairly stable. He has noticed some mild ankle swelling right greater than left. Lower extremityDoppler studies were negative for DVT. On 08/23/2013  amlodipine was reduced from 10 to7.5 mg and I reduced his amiodarone from 400 mg to 300 mg. Subsequently, he now is on a further reduce dose of amlodipine at just 2.5 mg daily. He does note continued increasing lower extremity edema, right leg greater than left. He has not been taking his furosemide  20 mg regularly.  On 10/31/2013 I discontinued his amlodipine and recommended furosemide 20 mg daily due to his significant  pretibial and ankle edema. We also discussed sodium restriction. Since that time he has been using 20 -30 mm  support stockings below the knee with marked benefit.  A renal duplex scan in February 2015 demonstrated right renal artery narrowing proximally in the range of 1-59% with velocities at 213, suggesting the upper end of the range.  His left renal artery had normal pain. He  He has numerous large anechoic cysts in both kidneys with the largest measuring 8.9 x 7.6 x 8.0.  He is also followed by [name] urologically.  On 01/12/2015.  He underwent a follow-up renal duplex scan.  He again had mild bilateral renal artery narrowing at the right renal artery.  Increased velocity proximally at 185 in the left renal artery.  A 206 consistent with a 159% range diameter reduction.  The left kidney again showed  numerous an echo.  It cysts.  The 2 largest measured 7.08.3 x a 8.0 cm and [id number] cm.  Left kidney could not accurately be measured in length.  Due to large cyst within the lower pole of the kidney.  Mr. Zinda has had problems with significant blood pressure lability and when seen in the past was taking multiple medications at different times throughout the day.  At that time he was taking Cardura 4 mg daily, hydralazine 50 mg twice a day , and an another dose at 25 mg, furosemide 20 mg daily, valsartan 320  mg.  He also is on levothyroxine 50 g for hypothyroidism.  He takes clonidine 0.1 mg if his blood pressure gets above 295 systolically.  He is also continuing to take amiodarone at 100 mg alternating with 200 mg and denies recurrent AF.  He is anticoagulated with eliquis.  He is very analytical and over the past 6 months he states that he has taken 607 blood pressure readings.  32 readings or 5% revealed blood pressure elevation 188 systolically for which he is took clonidine 5 times or 0.8%.    He has seen [name] on several occasions with significant blood pressure lability with blood pressure readings in excess of 416 systolically and diastolically over 90.  He also states at some times  his blood pressure may get low.  She had tried him on chlorthalidone, but a subsequent be met showed low potassium and sodium and it was discontinued.  At times he has noticed a sensation where he may have had transient atrial fibrillation.   When I last saw him, I tried to simplify his medications and recently he has been taking hydralazine 50 mg every 8 hours, valsartan 320 mg in the morning, Cardura 4 mg, amiodarone 100 mg and if he notes his blood pressure getting in excess of 200 he takes  0.1 mg clonidine PRN. At that time, it sounded like he may have had an episode of breakthrough atrial fibrillation and I slightly adjusted his amiodarone to 100 mg alternating with 200 mg every other day.  He brought with him his blood pressure data over the last several months including means and averages his blood pressure had been running in the 140-150 range, but there have been instances where his blood pressure is in excess of 200.  At his last office visit, I suggested the initiation of spironolactone which he took 12.5 mg for one week and then increase this to 12.5 mg twice a day.  This has resulted in significant stabilization of his blood pressure lability.    Since I last saw him, he apparently developed a small ischemic  CVA in 09/07/2017 and was hospitalized for 3 days was evaluated by Dr. Rigoberto Noel.  He presented with transient right hemianopia for 5-6 hours.  An MRI showed small cortical and subcortical left parieto-occipital infarct with petechial hemorrhagic transformation.  2.  The petechial hemorrhage is eloquent's was discontinued and he was started on aspirin for 7 days before plans to restart eliquis at 5 mg twice a day.  He was started on Lipitor 20 mg.  His neurologic symptoms completely resolved.  He has continued to have daily blood pressure readings as well as heart rate readings are charted out.  He did notice on January 11 that his mean pulse had risen to 66 wears previously was 62.  Several days later.  He apparently called the office and came 14 ECG.  At that time he was told  that his ECG did not show atrial fibrillation.  I was out of town, and ECG was apparently shown to another physician.  Since that time, he has noticed some mild irregularity to his pulse rate.  He believes his blood pressure has been fairly well controlled since spironolactone was added to his regimen.  He denies chest pain.  He did trip and fall on one occasion and landed on his wrists and has been wearing wrist support for stabilization.    When I saw him on 02/02/2017, his ECG demonstrated atrial flutter with variable block and a ventricular rate in the 80s.  There are nonspecific ST-T changes.  At that time, he had been taking amiodarone at 100 mg daily.  He was on anticoagulation with eliquis 5 mg bid.  At that time, I recommended initial titration of amiodarone back to 2 mg twice a day for 1 week and with his history of bradycardia in the past.  I recommended that after one week he reduce this back to 300 mg daily.  He has continued to monitor his heart rate of blood pressure recordings at home.  Laboratory was done on 03/09/2017.  Hemoglobin and hematocrit were stable, but he remain mildly anemic.  Creatinine was 1.33 which was stable.   LFTs were normal.  TSH was borderline increased at 5.7.  Lipid studies were excellent with a total cholesterol 122, and LDL 43.   I  saw him in August 2018. Grafts and data points from over the last several months showed a mean blood pressure in the 110-120 range and pulse rate typically at 56.  He denies awareness of recurrent atrial fibrillation.  He denied palpitations.  He  had noted dark stools but on evaluation, this was heme-negative.     He underwent right hernia surgery eating and tolerated this well.  He developed increasing liver function tests leading to discontinuance of amiodarone as well as atorvastatin.  He also developed a bladder infection and has had some issues with urinary incontinence.  He currently has been on antibiotic regimen of Cipro and is scheduled to see [name] in follow-up.  He continues to monitor his blood pressure.  His blood pressure over the past month has been well controlled with a mean at 114/64 and pulse at 67.  He feels improved.  He is noticed fluid accumulation anterior to his left knee.  He also admits to some occasional lower extremity edema.  He denies chest pain.  He is unaware of recurrent atrial fibrillation.    I last saw him in the office setting in September 2019.At that time, he was on spironolactone 12.5 mg twice a day, hydralazine 50 mg every 8 hours, doxazosin 4 mg daily.He was having some leg swelling and I suggested support stockings. Remotely he had developed LFT elevation and was taken off amiodarone and atorvastatin. During that evaluation he was in sinus rhythm with sinus bradycardia at 54 bpm. We had a long discussion today any recalled all his blood pressure data since his last evaluation. Apparently, several months after amiodarone was discontinued in [place] he felt his heart rate increasing. Apparently he was not evaluated until he saw Jory Sims on November 10, 2018. At that time he was in atrial fibrillation. His  pulse was 106. Apparently he was feeling weak and dizzy. Spironolactone was discontinued. At follow-up evaluation on November 24, 2018 he was still in A. fib with increased rate. He was started on metoprolol at that time and hydralazine  dose was reduced. He was seen by Jory Sims on December 29, 2018 and his blood pressure was low 103/69 with a pulse of 93. Hydralazine was further decreased to 12.5 mg twice a day and metoprolol was increased to 25 mg in the morning and 12.5 mg at night.\  I saw him in a telemedicine evaluation in April 2020.  At that time he felt that he was doing better and his blood pressure was running  in the 108 to 20 range. However over the last week his blood pressure has been running in the 976 systolically. He denied chest pain PND orthopnea. He does have some mild renal insufficiency with his last creatinine at 1.43.  He was sleeping well and denied any chest pain.  He denied any episodes of significant tachycardia.  He was last evaluated by Bunnie Domino in a telemedicine visit in September 2020.  He had had multiple medication changes and he was no longer on doxazosin and was on Flomax as well as metoprolol succinate 25 mg twice a day.  He has been documented to be in atrial fibrillation on 11 April 2019 ECG.  No EKG was done in September but his pulse rate was ranging in the 80-100 range.  He continued to be on Eliquis for anticoagulation for atrial fibrillation.  Since I saw him, he apparently has had issues with anemia and is followed by Dr. Laural Golden.  He was started on ferrous sulfate for  hemoglobin of 10.1 and hematocrit of 30.9.  He has continued to be very compulsive with blood pressure recordings.  He is close he followed by his primary physician Dr. Woody Seller.   I last evaluated in a telemedicine visit in January 2021. His blood pressure has been labile in 1 week ago had dropped to 83/61 briefly.  Hever most readings seem to indicate a blood pressure at  124/85, 120/85, however he stated there was 1 reading which was 169/80 which was questionable. He denied chest pain or shortness of breath mid to easy fatigability.  Since his last evaluation with me, he had experienced blood in his stool and recently had undergone colonoscopy by Dr. Melony Overly and was found to have a circumferential lesion in the  proximal limb ofthe splenic flexure felt to be consistent with colon cancer.  He will be undergoing further imaging studies that ultimately will need to be referred to a general surgeon for surgery.  He has been maintained on Eliquis for anticoagulation of his persistent atrial fibrillation.  He denies any anginal type symptoms.  He admits to weight loss.  He has noticed his pulse typically running higher and more recently around 100.  Blood pressures have been in the region of 150/84.  He presents for evaluation and potential preoperative clearance prior to his surgery.  Allergies  Allergen Reactions   Amiodarone Other (See Comments)    Not good for liver   Multaq [Dronedarone] Shortness Of Breath    Current Outpatient Medications  Medication Sig Dispense Refill   apixaban (ELIQUIS) 5 MG TABS tablet Take 1 tablet (5 mg total) by mouth 2 (two) times daily. 60 tablet 6   cetaphil (CETAPHIL) lotion Apply 1 application topically as needed for dry skin (forehead).     COMBIGAN 0.2-0.5 % ophthalmic solution Place 1 drop into both eyes 2 (two) times daily.     docusate sodium (COLACE) 100 MG capsule Take 2 capsules (200 mg total) by mouth at bedtime. (Patient taking differently: Take 200 mg by mouth daily  as needed for mild constipation. ) 60 capsule 0   ferrous sulfate 325 (65 FE) MG tablet Take 325 mg by mouth daily with breakfast.     finasteride (PROSCAR) 5 MG tablet Take 5 mg by mouth daily.     levothyroxine (SYNTHROID, LEVOTHROID) 75 MCG tablet Take 75 mcg by mouth daily before breakfast.      LORazepam (ATIVAN) 1 MG tablet Take 0.5 mg by mouth  at bedtime as needed.   2   metoprolol succinate (TOPROL-XL) 50 MG 24 hr tablet TAKE 2 TABLETS BY MOUTH TWICE A DAY (50 MG TWICE A DAY) 180 tablet 3   Multiple Vitamins-Minerals (CENTRUM SILVER PO) Take by mouth daily.     Multiple Vitamins-Minerals (MULTIVITAMIN PO) Take 1 tablet by mouth daily. Men 50 +     Simethicone (PHAZYME) 180 MG CAPS Take 180 mg by mouth daily as needed (GAS).      tamsulosin (FLOMAX) 0.4 MG CAPS capsule TAKE ONE CAPSULE BY MOUTH DAILY (Patient taking differently: Take 0.4 mg by mouth at bedtime. ) 30 capsule 6   Vitamin D, Cholecalciferol, 50 MCG (2000 UT) CAPS Take 2,000 Units by mouth daily.      No current facility-administered medications for this visit.    Socially he is married and has 3 children he quit tobacco in 1978. 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  ROS General: Negative; No fevers, chills, or night sweats;  HEENT: Positive for history of glaucoma No changes in vision or hearing, sinus congestion, difficulty swallowing Pulmonary: Negative; No cough, wheezing, shortness of breath, hemoptysis Cardiovascular: Negative; No chest pain, presyncope, syncope, palpitations Positive for intermittent leg swelling GI: Negative; No nausea, vomiting, diarrhea, or abdominal pain GU: Positive for renal cysts; No dysuria, hematuria, or difficulty voiding Musculoskeletal: Negative; no myalgias, joint pain, or weakness Hematologic/Oncology: Negative; no easy bruising, bleeding Endocrine: Negative; no heat/cold intolerance; no diabetes Neuro: Negative; no changes in balance, headaches Skin: Negative; No rashes or skin lesions Psychiatric: Negative; No behavioral problems, depression Sleep: Negative; No snoring, daytime sleepiness, hypersomnolence, bruxism, restless legs, hypnogognic hallucinations, no cataplexy Other comprehensive 14 point system review is negative.    PE BP (!) 154/94    Pulse (!) 104    Ht  '5\' 8"'$  (1.727 m)    Wt 141 lb 3.2 oz (64 kg)    SpO2 99%    BMI 21.47 kg/m    Repeat  blood pressure by me was 150/84  Wt Readings from Last 3 Encounters:  02/15/20 141 lb 3.2 oz (64 kg)  02/09/20 134 lb (60.8 kg)  11/21/19 146 lb (66.2 kg)   BP (!) 154/94    Pulse (!) 104    Ht '5\' 8"'$  (1.727 m)    Wt 141 lb 3.2 oz (64 kg)    SpO2 99%    BMI 21.47 kg/m  General: Alert, oriented, no distress.  Skin: normal turgor, no rashes, warm and dry HEENT: Normocephalic, atraumatic. Pupils equal round and reactive to light; sclera anicteric; extraocular muscles intact;  Nose without nasal septal hypertrophy Mouth/Parynx benign; Mallinpatti scale 3 Neck: No JVD, no carotid bruits; normal carotid upstroke Lungs: clear to ausculatation and percussion; no wheezing or rales Chest wall: without tenderness to palpitation Heart: PMI not displaced, regularly irregular with heart rate around 100 bpm, s1 s2 normal, 1/6 systolic murmur, no diastolic murmur, no rubs, gallops, thrills, or heaves Abdomen: soft, nontender; no hepatosplenomehaly, BS+; abdominal aorta  nontender and not dilated by palpation. Back: no CVA tenderness Pulses 2+ Musculoskeletal: full range of motion, normal strength, no joint deformities Extremities: no clubbing cyanosis or edema, Homan's sign negative  Neurologic: grossly nonfocal; Cranial nerves grossly wnl Psychologic: Normal mood and affect  ECG (independently read by me): Atrial fibrillation with ventricular rate at 104 bpm.  QTc interval 491 ms.  Nonspecific ST abnormality  September 2019 ECG (independently read by me): Sinus bradycardia 54 bpm.  Long QT interval at 41 ms.  No ST segment changes.  No ectopy.  August 2018 ECG (independently read by me): Sinus bradycardia at 47 bpm with first-degree AV block with a PR interval at 232 ms.  No significant ST-T changes.  May 2018 ECG (independently read by me): Sinus bradycardia at 57 bpm with first degree AV block with a PR interval  of 256 ms.  No significant ST-T changes.  02/02/2017 ECG (independently read by me): Atrial flutter with variable block with ventricular rate in the 80s.  Nonspecific ST-T changes.  QTc interval 493 ms.  October 2017 ECG (independently read by me): Sinus bradycardia at 43 bpm with a meal.  First-degree block.  PAC.  QTc interval normal.  July 2017 ECG (independently read by me): Sinus rhythm but bradycardic in the 40s with sinus arrhythmia.  Nonspecific ST changes.  April 2017 ECG (independently read by me): August bradycardia at 53 bpm.  Sinus arrhythmia.  Nonspecific ST changes.  09/24/2015 ECG (independently read by me): Sinus bradycardia, first-degree AV block.  Ventricular rate 51.  PR interval 218 ms.  Nondiagnostic ST changes  May 2016 ECG (independently read by me): Sinus bradycardia 52 bpm.  PR interval 198 ms, QTc interval 451 ms.  Nonspecific interventricular conduction delay.  February 2016 ECG (independently read by me, and (: Sinus bradycardia 48 bpm.  Nonspecific conduction delay.  No significant ST abnormalities.  QTc interval 434 ms.  November 2015 ECG (independently read by me):  Sinus bradycardia 52 bpm.  PR interval 216 ms.  No ectopy.  July 2015ECG (independently read by me): Sinus bradycardia with first degree AV block.  PR interval 240 ms.  Nonspecific ST changes.  PAC.  Prior 01/10/2014 ECG (independently read by me) sinus bradycardia 53 beats per minute. QTc interval 457 ms. No ectopy.  Prior 12/12/2013 ECG  (independently read by me): Sinus rhythm at 56 beats per minute. PR interval 206 ms. QTc interval 470 ms   Prior ECG of 10/31/2013: Normal sinus rhythm at 61 beats per minute. Isolated  PVC.  Non-specific intra-ventricular conduction delay. Nonspecific ST-T changes. QTc interval 461 ms.  LABS:  BMP Latest Ref Rng & Units 02/14/2020 07/15/2019 11/10/2018  Glucose 65 - 99 mg/dL - 89 100(H)  BUN 10 - 36 mg/dL - 21 23  Creatinine 0.76 - 1.27 mg/dL 1.15 1.19 1.43(H)   BUN/Creat Ratio 10 - 24 - 18 16  Sodium 134 - 144 mmol/L - 140 137  Potassium 3.5 - 5.2 mmol/L - 4.0 4.8  Chloride 96 - 106 mmol/L - 105 103  CO2 20 - 29 mmol/L - 22 21  Calcium 8.6 - 10.2 mg/dL - 9.1 8.7    Hepatic Function Latest Ref Rng & Units 11/10/2018 08/03/2018 06/25/2018  Total Protein 6.0 - 8.5 g/dL 5.8(L) 5.6(L) 5.5(L)  Albumin 3.2 - 4.6 g/dL 3.8 3.5 3.5  AST 0 - 40 IU/L 29 32 53(H)  ALT 0 - 44 IU/L 19 26 72(H)  Alk Phosphatase 39 - 117 IU/L 89 87 84  Total Bilirubin 0.0 - 1.2 mg/dL 0.8 0.7 0.9  Bilirubin, Direct 0.00 - 0.40 mg/dL - 0.22 0.29    CBC Latest Ref Rng & Units 02/09/2020 01/19/2020 07/15/2019  WBC 3.4 - 10.8 x10E3/uL - 4.9 4.4  Hemoglobin 13.0 - 17.0 g/dL 10.0(L) 11.0(L) 11.6(L)  Hematocrit 39.0 - 52.0 % 32.6(L) 36.2(L) 36.2(L)  Platelets 150 - 450 x10E3/uL - 186 158   Lab Results  Component Value Date   MCV 92 01/19/2020   MCV 97 07/15/2019   MCV 96 11/10/2018   Lab Results  Component Value Date   TSH 2.900 11/10/2018   Lab Results  Component Value Date   HGBA1C 5.6 09/07/2016     Lipid Panel     Component Value Date/Time   CHOL 122 03/09/2017 0831   TRIG 75 03/09/2017 0831   HDL 64 03/09/2017 0831   CHOLHDL 3.1 09/08/2016 0507   VLDL 18 09/08/2016 0507   LDLCALC 43 03/09/2017 0831      RADIOLOGY: No results found.  CARDIAC STUDIES:   ECHO 09/08/2016 Study Conclusions   - Left ventricle: The cavity size was normal. There was mild focal  basal hypertrophy of the septum. Systolic function was normal.  The estimated ejection fraction was in the range of 55% to 60%.  Wall motion was normal; there were no regional wall motion  abnormalities. Doppler parameters are consistent with abnormal  left ventricular relaxation (grade 1 diastolic dysfunction).  - Aortic valve: Trileaflet; mildly thickened, mildly calcified  leaflets. There was mild regurgitation.  - Mitral valve: Calcified annulus.  - Left atrium: The atrium was  moderately dilated. Volume/bsa, S:  40.4 ml/m^2.  - Right ventricle: The cavity size was mildly dilated. Wall  thickness was normal. Systolic function was mildly reduced.  - Right atrium: The atrium was moderately dilated.  - Tricuspid valve: There was moderate regurgitation.  - Pulmonary arteries: Systolic pressure was moderately increased.  PA peak pressure: 55 mm Hg (S).   Impressions:   - Compared to the prior study, there has been no significant  interval change.    IMPRESSION:  1. Longstanding persistent atrial fibrillation ([place])   2. Pre-op evaluation   3. Malignant neoplasm of splenic flexure ([place])   4. Essential hypertension   5. Other iron deficiency anemia   6. Anticoagulated   7. Hypothyroidism, unspecified type     ASSESSMENT AND PLAN: Mr. Reine is a Young appearing 86 year-old white male who has a history of PAF and had maintained sinus rhythm following initiation of amiodarone. Remotely, his amiodarone dose had been tapered down to 100 alternating with 200 every other day due to bradycardia.  In November 2017 he developed transient right hemianopsia for 5-6 hours and on MRI imaging and was found to have small right cortical and subcortical left parieto-occipital infarct with petechial hemorrhage.  His eliquis was held and he was treated with full dose aspirin for 1 week and ultimately eliquis was resumed by the neurologist at 5 mg twice a day.  In January 2018 , he notices pulse rate had increased.  I reviewed his ECG from January 22 and by my review, it appears that the patient was in atrial flutter at that time.  When I saw him on 02/02/2017, his ECG confirmed atrial flutter with variable block.  This ultimately normalized with increasing amiodarone dose but unfortunately he developed significant LFT elevation necessitating discontinuance.  Over the last several years he has been in persistent longstanding atrial fibrillation and has been  maintained on Eliquis for  anticoagulation.  Since I last saw him, he has been diagnosed with colon CA and was found to have a circumferential lesion in the proximal limb of the splenic flexure on colonoscopy last week by Dr. Claiborne Rigg.  He will need to undergo surgery and is scheduled to undergo subsequent imaging analysis this week prior to scheduling him to see a general surgeon.  He denies any chest pain and has never had anginal symptomatology.  He has had issues with labile blood pressure and blood pressure today is elevated.  His ECG shows atrial fibrillation rate is increased at 104 bpm and I am recommending further titration of metoprolol succinate from his present dose of 37.5 milligrams twice a day to 50 mg twice a day.  I am scheduling him for 2D echo Doppler study for preoperative assessment of LV systolic and diastolic function.  In the past he had issues with chronic kidney disease but renal function had improved.  He has been followed by [name] for urologic issues and his renal cyst.  Clinically he appears cardiovascularly stable to undergo potential upcoming colon resection surgery.  I will contact him regarding his echocardiographic studies.  He will need to hold for at least 2 days prior to surgery.  I will see him in several months for follow-up Cardiologic evaluation.   Time spent: 30 minutes  Troy Sine, MD, [facility]  02/17/2020 7:44 AM

## 2020-02-20 DIAGNOSIS — Z961 Presence of intraocular lens: Secondary | ICD-10-CM | POA: Diagnosis not present

## 2020-02-20 DIAGNOSIS — H401122 Primary open-angle glaucoma, left eye, moderate stage: Secondary | ICD-10-CM | POA: Diagnosis not present

## 2020-02-20 DIAGNOSIS — H401111 Primary open-angle glaucoma, right eye, mild stage: Secondary | ICD-10-CM | POA: Diagnosis not present

## 2020-02-21 ENCOUNTER — Telehealth: Payer: Self-pay | Admitting: Cardiovascular Disease

## 2020-02-21 ENCOUNTER — Other Ambulatory Visit: Payer: Self-pay

## 2020-02-21 ENCOUNTER — Ambulatory Visit (HOSPITAL_COMMUNITY)
Admission: RE | Admit: 2020-02-21 | Discharge: 2020-02-21 | Disposition: A | Discharge status: Home / Self Care | Payer: Medicare Other | Source: Ambulatory Visit | Attending: Cardiovascular Disease | Admitting: Cardiovascular Disease

## 2020-02-21 DIAGNOSIS — I083 Combined rheumatic disorders of mitral, aortic and tricuspid valves: Secondary | ICD-10-CM | POA: Diagnosis not present

## 2020-02-21 DIAGNOSIS — I4811 Longstanding persistent atrial fibrillation: Secondary | ICD-10-CM

## 2020-02-21 DIAGNOSIS — I4891 Unspecified atrial fibrillation: Secondary | ICD-10-CM | POA: Insufficient documentation

## 2020-02-21 DIAGNOSIS — Z01818 Encounter for other preprocedural examination: Secondary | ICD-10-CM | POA: Insufficient documentation

## 2020-02-21 NOTE — Progress Notes (Signed)
  Echocardiogram 2D Echocardiogram has been performed.  Dylan York 02/21/2020, 1:56 PM

## 2020-02-21 NOTE — Telephone Encounter (Signed)
Spoke to pharmacy rep and confirmed Metoprolol dose of 50 mg BID.

## 2020-02-21 NOTE — Telephone Encounter (Signed)
Pt c/o medication issue:  1. Name of Medication: metoprolol succinate (TOPROL-XL) 50 MG 24 hr tablet  2. How are you currently taking this medication (dosage and times per day)?  Unsure how to take  3. Are you having a reaction (difficulty breathing--STAT)? no  4. What is your medication issue?  Mel Almond from [facility] calling for clarification on directions for the medication. They would like to know if the patient is to take 2 tablets of the medication 2x a day or 1 tablet 2x a day. Please advise.

## 2020-02-22 ENCOUNTER — Other Ambulatory Visit: Payer: Self-pay | Admitting: Adult Health

## 2020-02-22 ENCOUNTER — Telehealth: Payer: Self-pay | Admitting: Cardiovascular Disease

## 2020-02-22 NOTE — Telephone Encounter (Signed)
Patient is requesting to speak with Dr. Evette Georges nurse. He states he was just recently diagnosed with colon cancer.

## 2020-02-22 NOTE — Telephone Encounter (Signed)
Spoke with pt who state he has completed the required pre-op work and waiting for Dr. Claiborne Billings approval for clearance.   Will route to MD and Nurse

## 2020-02-22 NOTE — Telephone Encounter (Signed)
Left message to call back  

## 2020-02-23 NOTE — Telephone Encounter (Signed)
Ok for clearance; I had said this in the echo result in-box

## 2020-02-23 NOTE — Telephone Encounter (Signed)
Pt ECHO results and verbalized understanding. ECHO results and OV e-faxed to Dr. Orest Dikes office for to show pt is cleared for surgery.

## 2020-02-24 NOTE — Telephone Encounter (Signed)
noted 

## 2020-02-27 ENCOUNTER — Other Ambulatory Visit: Payer: Self-pay | Admitting: Surgery

## 2020-02-27 ENCOUNTER — Telehealth: Payer: Self-pay

## 2020-02-27 DIAGNOSIS — C189 Malignant neoplasm of colon, unspecified: Secondary | ICD-10-CM

## 2020-02-27 NOTE — Telephone Encounter (Signed)
Per Dr.Kelly -- "EF 45 to 50%. No wall motion abnormalities. Mild to moderate aortic sclerosis without stenosis. Patient has atrial fibrillation and has severe biatrial enlargement. Okay for clearance for GI surgery of his colonic mass."

## 2020-02-27 NOTE — Telephone Encounter (Signed)
Called and reviewed ECHO results with pt notified he is also cleared for his Surgery with Dylan York.   He states his father had died from hardening of the arteries and would like more information about that from his echo. He states that his pulse rate is still elevated in the low 100s even with his increase metoprolol that was changed at his last visit with Dylan York. he also states that his BP fluctuates and this morning was 94/67 and this "wiped me out". Notified I would let Dylan York know of his increase HR and lower BP as well as more explanation on the Echo results.   Pt states Dylan York with [facility] has also ordered a chest CT for 03/13/20. Notified that Dylan York would be able to see this as well. Pt verbalized understanding. No other questions at this time.

## 2020-02-28 ENCOUNTER — Other Ambulatory Visit (HOSPITAL_COMMUNITY): Payer: Self-pay | Admitting: Surgery

## 2020-02-28 ENCOUNTER — Telehealth: Payer: Self-pay | Admitting: Cardiovascular Disease

## 2020-02-28 DIAGNOSIS — C189 Malignant neoplasm of colon, unspecified: Secondary | ICD-10-CM | POA: Diagnosis not present

## 2020-02-28 NOTE — Telephone Encounter (Signed)
Called back [facility] and spoke with a triage nurse. They need clearance for eliquis. They will send a formal request for clearance to our fax to be filled out. No other questions at this time.

## 2020-02-28 NOTE — Telephone Encounter (Signed)
Kennyth Lose from [facility] was calling for additional information for the patient's surgical clearance. She saw that the clearance was based on the patient's recent echocardiogram, but the patient is also on Eliquis. [facility] needs to know how long to have the patient hold the eliquis. The surgery is not scheduled yet but it is marked as urgent so they would like to have it done soon

## 2020-02-28 NOTE — Telephone Encounter (Signed)
   Primary Cardiologist: Shelva Majestic, MD  Chart reviewed as part of pre-operative protocol coverage. The patient was recently seen by Dr. Claiborne Billings 02/15/20. Underwent echo and cleared for surgery.   Given past medical history and time since last visit, based on ACC/AHA guidelines, JAXAN KITCHENS would be at acceptable risk for the planned procedure without further cardiovascular testing.   Wait pharmacy recommendations.   [place], [place] 02/28/2020, 4:46 PM

## 2020-02-28 NOTE — Telephone Encounter (Signed)
   [facility] Pre-operative Risk Assessment    Request for surgical clearance:  1. What type of surgery is being performed? Colectomy  2. When is this surgery scheduled? TBD  3. What type of clearance is required (medical clearance vs. Pharmacy clearance to hold med vs. Both)? Both  4. Are there any medications that need to be held prior to surgery and how long? Eliquis tbd  5. Practice name and name of physician performing surgery? [facility]-- Nadeen Landau MD  6. What is your office phone number [phone]   7.   What is your office fax number [phone]  8.   Anesthesia type (None, local, MAC, general) ? general   Dylan York Dylan York 02/28/2020, 4:21 PM  _________________________________________________________________   (provider comments below)

## 2020-02-29 NOTE — Telephone Encounter (Signed)
Patient with diagnosis of afib on Eliquis for anticoagulation.    Procedure: Colectomy Date of procedure: TBD  CHADS2-VASc score of  6 (CHF, HTN, AGE, stroke/tia x 2, AGE)  CrCl 47 ml/min  Per Dr. Claiborne Billings note on 02/15/20 patient may hold Eliquis 2 days prior to procedure. Patient is at higher risk off anticoagulation due to history of stroke, but bleed risk of surgery is also high.

## 2020-02-29 NOTE — Telephone Encounter (Signed)
   Primary Cardiologist: Shelva Majestic, MD  Chart reviewed as part of pre-operative protocol coverage. Given past medical history and time since last visit, based on ACC/AHA guidelines, LEVANDER WECKWERTH would be at acceptable risk for the planned procedure without further cardiovascular testing.   OK to hold Eliquis two days pre op.  I will route this recommendation to the requesting party via Epic fax function and remove from pre-op pool.  Please call with questions.  Kerin Ransom, PA-C 02/29/2020, 10:13 AM

## 2020-03-02 DIAGNOSIS — I471 Supraventricular tachycardia: Secondary | ICD-10-CM | POA: Diagnosis not present

## 2020-03-02 DIAGNOSIS — E7800 Pure hypercholesterolemia, unspecified: Secondary | ICD-10-CM | POA: Diagnosis not present

## 2020-03-04 NOTE — Telephone Encounter (Signed)
agree

## 2020-03-06 ENCOUNTER — Encounter (HOSPITAL_COMMUNITY): Payer: Self-pay

## 2020-03-06 ENCOUNTER — Ambulatory Visit (HOSPITAL_COMMUNITY)
Admission: RE | Admit: 2020-03-06 | Discharge: 2020-03-06 | Disposition: A | Discharge status: Home / Self Care | Payer: Medicare Other | Source: Ambulatory Visit | Attending: Surgery | Admitting: Surgery

## 2020-03-06 ENCOUNTER — Other Ambulatory Visit: Payer: Self-pay

## 2020-03-06 DIAGNOSIS — C189 Malignant neoplasm of colon, unspecified: Secondary | ICD-10-CM | POA: Diagnosis not present

## 2020-03-06 DIAGNOSIS — R918 Other nonspecific abnormal finding of lung field: Secondary | ICD-10-CM | POA: Diagnosis not present

## 2020-03-06 MED ORDER — SODIUM CHLORIDE (PF) 0.9 % IJ SOLN
INTRAMUSCULAR | Status: AC
  Filled 2020-03-06: qty 50

## 2020-03-06 MED ORDER — IOHEXOL 300 MG/ML  SOLN
75.0000 mL | Freq: Once | INTRAMUSCULAR | Status: AC | PRN
  Administered 2020-03-06: 75 mL via INTRAVENOUS

## 2020-03-09 ENCOUNTER — Other Ambulatory Visit (INDEPENDENT_AMBULATORY_CARE_PROVIDER_SITE_OTHER): Payer: Medicare Other

## 2020-03-09 DIAGNOSIS — I10 Essential (primary) hypertension: Secondary | ICD-10-CM

## 2020-03-13 ENCOUNTER — Other Ambulatory Visit: Payer: Medicare Other

## 2020-03-14 ENCOUNTER — Other Ambulatory Visit: Payer: Self-pay | Admitting: *Deleted

## 2020-03-14 NOTE — Patient Outreach (Signed)
[facility]) Care Management  03/14/2020  Dylan York Mar 08, 1926 [id number]   Responding to a Nurse-Call-line message/[place] received a e-mail with the same requested information.  RN spoke with pt who was inquiring on the process of receiving rehab post-op surgery pending in a few months. Pt explained the this process occurs while he is in the hospital post procedure with a team of case managers and social workers if the provider feels pt needs inpt rehab vs home with [facility] for PT/OT services. Pt understanding this can not be arranged at this time being that he has not had any related surgeries. Pt aware that the provider will decide after he is evaluate by PT while in the hospital for any type of therapies. Pt verbalized an understanding of all information provided after several reviews of this information.  No other questions or inquires at this time as all questions answered with no additional needs to address.   Plan: No additional needs case is closed.  Raina Mina, RN Care Management Coordinator [place] Office [phone]

## 2020-03-15 ENCOUNTER — Ambulatory Visit: Payer: Self-pay | Admitting: Surgery

## 2020-03-15 NOTE — H&P (Signed)
CC: Referred by Dr. Laural Golden - distal transverse colon cancer  HPI: [name] is a very pleasant 12yoM with hx of Afib, hypothyroidism, TIA (reportedly 2/2 irregular heart beat - started anticoagulation following) and presents to our office as a referral by Dr. Laural Golden. Approximately 1-2 weeks prior to his colonoscopy, he experienced bright red blood per rectum after bowel movement. This is an isolated event and hasn't occurred since. He underwent colonoscopy for this reason for/8/21 which demonstrated a polypoid ulcerated nonobstructing mass in the vicinity of the splenic flexure. This was biopsied. Pathology returned adenocarcinoma. Diverticula, few, sigmoid colon. 4 mm rectal polyp. He underwent staging CT abdomen and pelvis that has yet to undergo CT chest. The abdomen and pelvis demonstrated the mass to be in the distal transverse colon concordant with endoscopic location-on CT it was a 4 cm apple core lesion. No findings in the abdomen suspicious for metastatic disease. He is here today for follow-up. He denies any complaints. He denies any abdominal pain, nausea/vomiting. He is having regular bowel movements. His weight was down to 130lbs but is up to 135 today. He is on Eliquis but denies any blood in his stool this time.  PMH: Afib (on Eliquis - follows with Dr. Claiborne Billings); hypothyroidism; TIA  PSH: Open appendectomy at the age of 51; umbilical/ventral hernia repair with mesh. He has both a transverse and a midline incision around his umbilicus.  FHx: Denies FHx of colorectal, breast, endometrial, ovarian or cervical cancer  Social: Denies use of tobacco/EtOH/drugs. Quit using tobacco products in 1978. He previously worked with [employer], retired in 1989. He was at home with his wife, Danton Clap whom he helps take care of - she is in a wheel chair.  ROS: A comprehensive 10 system review of systems was completed with the patient and pertinent findings as noted above.  The  patient is a 86 year old male.   Past Surgical History [facility] Teressa Senter, [place]; 02/28/2020 9:16 AM) Appendectomy  Cataract Surgery  Bilateral. Open Inguinal Hernia Surgery  multiple  Diagnostic Studies History (Chanel Teressa Senter, CMA; 02/28/2020 9:16 AM) Colonoscopy  within last year  Allergies Geni Bers Haggett, RMA; 02/28/2020 9:26 AM) Allergies Reconciled  Amiodarone HCl *ANTIARRHYTHMICS*  Multaq *ANTIARRHYTHMICS*  Shortness of breath.  Medication History (Chanel Teressa Senter, CMA; 02/28/2020 9:18 AM) LORazepam (1MG  Tablet, Oral) Active. Combigan (0.2-0.5% Solution, Ophthalmic) Active. Colace (Oral) Specific strength unknown - Active. Levothyroxine Sodium (75MCG Tablet, Oral) Active. Tamsulosin HCl (0.4MG  Capsule, Oral) Active. Metoprolol Succinate ER (50MG  Tablet ER 24HR, Oral) Active. Finasteride (5MG  Tablet, Oral) Active. Eliquis (5MG  Tablet, Oral) Active. Vitamin D (50 MCG(2000 UT) Tablet, Oral) Active. Medications Reconciled  Social History Antonietta Jewel, CMA; 02/28/2020 9:16 AM) Alcohol use  Moderate alcohol use. Caffeine use  Coffee. No drug use  Tobacco use  Former smoker.  Family History Antonietta Jewel, [place]; 02/28/2020 9:16 AM) Cancer  Mother. Heart Disease  Father.  Other Problems Antonietta Jewel, CMA; 02/28/2020 9:16 AM) Atrial Fibrillation  Cerebrovascular Accident  Colon Cancer  Heart murmur  High blood pressure  Inguinal Hernia  Melanoma     Review of Systems (Chanel Nolan CMA; 02/28/2020 9:16 AM) General Present- Weight Loss. Not Present- Appetite Loss, Chills, Fatigue, Fever, Night Sweats and Weight Gain. Skin Not Present- Change in Wart/Mole, Dryness, Hives, Jaundice, New Lesions, Non-Healing Wounds, Rash and Ulcer. HEENT Present- Wears glasses/contact lenses. Not Present- Earache, Hearing Loss, Hoarseness, Nose Bleed, Oral Ulcers, Ringing in the Ears, Seasonal Allergies, Sinus Pain, Sore Throat, Visual Disturbances and Yellow  Eyes.  Respiratory Not Present- Bloody sputum, Chronic Cough, Difficulty Breathing, Snoring and Wheezing. Breast Not Present- Breast Mass, Breast Pain, Nipple Discharge and Skin Changes. Cardiovascular Present- Rapid Heart Rate. Not Present- Chest Pain, Difficulty Breathing Lying Down, Leg Cramps, Palpitations, Shortness of Breath and Swelling of Extremities. Gastrointestinal Present- Bloating, Difficulty Swallowing and Gets full quickly at meals. Not Present- Abdominal Pain, Bloody Stool, Change in Bowel Habits, Chronic diarrhea, Constipation, Excessive gas, Hemorrhoids, Indigestion, Nausea, Rectal Pain and Vomiting. Male Genitourinary Present- Impotence and Urine Leakage. Not Present- Blood in Urine, Change in Urinary Stream, Frequency, Nocturia, Painful Urination and Urgency. Musculoskeletal Not Present- Back Pain, Joint Pain, Joint Stiffness, Muscle Pain, Muscle Weakness and Swelling of Extremities. Neurological Not Present- Decreased Memory, Fainting, Headaches, Numbness, Seizures, Tingling, Tremor, Trouble walking and Weakness. Psychiatric Not Present- Anxiety, Bipolar, Change in Sleep Pattern, Depression, Fearful and Frequent crying. Endocrine Not Present- Cold Intolerance, Excessive Hunger, Hair Changes, Heat Intolerance, Hot flashes and New Diabetes. Hematology Present- Blood Thinners and Easy Bruising. Not Present- Excessive bleeding, Gland problems, HIV and Persistent Infections.  Vitals (Chanel Nolan CMA; 02/28/2020 9:18 AM) 02/28/2020 9:18 AM Weight: 143.25 lb Height: 68in Body Surface Area: 1.77 m Body Mass Index: 21.78 kg/m  Temp.: 97.78F  Pulse: 113 (Regular)        Physical Exam [place] Gave M. Demonica Farrey MD; 02/28/2020 10:04 AM) The physical exam findings are as follows: Note: Constitutional: No acute distress; conversant; no deformities; wearing mask Eyes: Moist conjunctiva; no lid lag; anicteric sclerae; pupils equal and round Neck: Trachea midline; no palpable  thyromegaly Lungs: Normal respiratory effort; no tactile fremitus CV: irreg rate/rhythm; no palpable thrill; no pitting edema GI: Abdomen soft, nontender, nondistended; no palpable hepatosplenomegaly MSK: Normal gait; does not require assistive devices; no clubbing/cyanosis Psychiatric: Appropriate affect; alert and oriented 3 Lymphatic: No palpable cervical or axillary lymphadenopathy    Assessment & Plan [place] Gave M. Kimberle Stanfill MD; 02/28/2020 10:07 AM) COLON CANCER (C18.9) Story: Mr. Ferko is a very pleasant 70yoM with hx TIA, afib (on Eliquis), hypothyroidism - clear with a newly diagnosed adenocarcinoma in the distal transverse colon in close proximity to the splenic flexure -CT abdomen and pelvis demonstrated no evidence of metastatic disease but he has not had a CT chest yet. The mass was found to be in this location on his CT scan (which I reviewed) and endoscopically. Impression: -The anatomy and physiology of the GI tract was discussed at length with the patient. The pathophysiology of colon cancer was discussed at length with associated pictures as well. -We discussed options going forward including surgery-laparoscopic and potential open techniques to a segmental colectomy at this location. Possible flexible sigmoidoscopy. We discussed other options including no surgery or even chemotherapy but those not having any sort of curative intent. -The procedure, material risks (including, but not limited to, pain, bleeding, infection, scarring, need for blood transfusion, damage to surrounding structures- blood vessels/nerves/viscus/organs, damage to ureter, urine leak, leak from anastomosis, need for additional procedures, worsening of pre-existing medical conditions, potential need for stoma which may be permanent, hernia, recurrence of cancer, DVT/PE, pneumonia, heart attack, stroke or TIA, death) benefits and alternatives to surgery were discussed at length. The patient's and his wife's  questions were answered to their satisfaction, they voiced understanding and elected to proceed with surgery. Additionally, we discussed typical postoperative expectations and the recovery process. We also discussed the potential increased risks as noted above in relation to his advanced age. That said, he does appear to have a good functional status, cares for  himself, and ambulates without the aid of assistive devices. -Cardiac clearance obtained; CT Chest pending; will need to hold anticoagulation perioperatively  This patient encounter took 75 minutes today to perform the following: take history, perform exam, review outside records, interpret imaging, counsel the patient on their diagnosis and document encounter, findings & plan in the EHR  Signed by Ileana Roup, MD (02/28/2020 10:08 AM)

## 2020-03-22 DIAGNOSIS — L719 Rosacea, unspecified: Secondary | ICD-10-CM | POA: Diagnosis not present

## 2020-03-22 DIAGNOSIS — I10 Essential (primary) hypertension: Secondary | ICD-10-CM | POA: Diagnosis not present

## 2020-03-22 DIAGNOSIS — Z299 Encounter for prophylactic measures, unspecified: Secondary | ICD-10-CM | POA: Diagnosis not present

## 2020-03-30 ENCOUNTER — Encounter (HOSPITAL_COMMUNITY): Payer: Self-pay

## 2020-03-30 NOTE — Patient Instructions (Addendum)
DUE TO COVID-19 ONLY ONE VISITOR ARE ALLOWED TO COME WITH YOU AND STAY IN THE WAITING ROOM ONLY DURING PRE OP AND PROCEDURE. THEN TWO VISITORS MAY VISIT WITH YOU IN YOUR PRIVATE ROOM DURING VISITING HOURS ONLY!!   COVID SWAB TESTING MUST BE COMPLETED ON:  Tuesday, April 10, 2020 1:05 PM [address] [place] -[facility] enter pre surgical testing line (Must self quarantine after testing. Follow instructions on handout.)             Your procedure is scheduled on: Friday, April 13, 2020   Report to [facility] Main  Entrance    Report to admitting at 10:00 AM   Call this number if you have problems the morning of surgery [phone]   The day of prep drink plenty of fluids to prevent dehyrdation   Dulocolax: take the day prior to surgery   Neomycin and Metronidazole:  At 2 pm, 3 pm and 10 pm after Miralax bowel prep the day prior to surgery.   Miralax:  Mix with 64 oz Gatorade/Powerade. Drink gradually over the next few hours (8 oz glass every 15-30 minutes) until gone the day prior to surgery.    Drink 2 pre surgery ensure drinks at 10:00 PM the night before surgery   Do not eat food  :After Midnight.   May have liquids until 9:00 AM the day of surgery   CLEAR LIQUID DIET  Foods Allowed                                                                     Foods Excluded  Water, Black Coffee and tea, regular and decaf                             liquids that you cannot  Plain Jell-O in any flavor  (No red)                                           see through such as: Fruit ices (not with fruit pulp)                                     milk, soups, orange juice  Iced Popsicles (No red)                                    All solid food Carbonated beverages, regular and diet                                    Apple juices Sports drinks like Gatorade (No red) Lightly seasoned clear broth or consume(fat free) Sugar, honey syrup  Sample  Menu Breakfast  Lunch                                     Supper Cranberry juice                    Beef broth                            Chicken broth Jell-O                                     Grape juice                           Apple juice Coffee or tea                        Jell-O                                      Popsicle                                                Coffee or tea                        Coffee or tea   Complete one Ensure drink the morning of surgery at 9:00 AM  the day of surgery.   Oral Hygiene is also important to reduce your risk of infection.                                    Remember - BRUSH YOUR TEETH THE MORNING OF SURGERY WITH YOUR REGULAR TOOTHPASTE   Do NOT smoke after Midnight   Take these medicines the morning of surgery with A SIP OF WATER: Levothyroxine, Metoprolol   Use eye drops morning of surgery                               You may not have any metal on your body including jewelry, and body piercings             Do not wear lotions, powders, perfumes/cologne, or deodorant                           Men may shave face and neck.   Do not bring valuables to the hospital. [place].   Contacts, dentures or bridgework may not be worn into surgery.   Bring small overnight bag day of surgery.    Patients discharged the day of surgery will not be allowed to drive home.   Special Instructions: Bring a copy of your healthcare power of attorney and living will documents         the day of surgery  if you haven't scanned them in before.              Please read over the following fact sheets you were given: IF YOU HAVE QUESTIONS ABOUT YOUR PRE OP INSTRUCTIONS PLEASE CALL [phone]   [facility] - Preparing for Surgery Before surgery, you can play an important role.  Because skin is not sterile, your skin needs to be as free of germs as possible.  You can reduce  the number of germs on your skin by washing with CHG (chlorahexidine gluconate) soap before surgery.  CHG is an antiseptic cleaner which kills germs and bonds with the skin to continue killing germs even after washing. Please DO NOT use if you have an allergy to CHG or antibacterial soaps.  If your skin becomes reddened/irritated stop using the CHG and inform your nurse when you arrive at Short Stay. Do not shave (including legs and underarms) for at least 48 hours prior to the first CHG shower.  You may shave your face/neck.  Please follow these instructions carefully:  1.  Shower with CHG Soap the night before surgery and the  morning of surgery.  2.  If you choose to wash your hair, wash your hair first as usual with your normal  shampoo.  3.  After you shampoo, rinse your hair and body thoroughly to remove the shampoo.                             4.  Use CHG as you would any other liquid soap.  You can apply chg directly to the skin and wash.  Gently with a scrungie or clean washcloth.  5.  Apply the CHG Soap to your body ONLY FROM THE NECK DOWN.   Do   not use on face/ open                           Wound or open sores. Avoid contact with eyes, ears mouth and   genitals (private parts).                       Wash face,  Genitals (private parts) with your normal soap.             6.  Wash thoroughly, paying special attention to the area where your    surgery  will be performed.  7.  Thoroughly rinse your body with warm water from the neck down.  8.  DO NOT shower/wash with your normal soap after using and rinsing off the CHG Soap.                9.  Pat yourself dry with a clean towel.            10.  Wear clean pajamas.            11.  Place clean sheets on your bed the night of your first shower and do not  sleep with pets. Day of Surgery : Do not apply any lotions/deodorants the morning of surgery.  Please wear clean clothes to the hospital/surgery center.  FAILURE TO FOLLOW THESE  INSTRUCTIONS MAY RESULT IN THE CANCELLATION OF YOUR SURGERY  PATIENT SIGNATURE_________________________________  NURSE SIGNATURE__________________________________  ________________________________________________________________________   Dylan York  An incentive spirometer is a tool that can help keep your lungs clear and active. This tool measures how well you are  filling your lungs with each breath. Taking long deep breaths may help reverse or decrease the chance of developing breathing (pulmonary) problems (especially infection) following:  A long period of time when you are unable to move or be active. BEFORE THE PROCEDURE   If the spirometer includes an indicator to show your best effort, your nurse or respiratory therapist will set it to a desired goal.  If possible, sit up straight or lean slightly forward. Try not to slouch.  Hold the incentive spirometer in an upright position. INSTRUCTIONS FOR USE  1. Sit on the edge of your bed if possible, or sit up as far as you can in bed or on a chair. 2. Hold the incentive spirometer in an upright position. 3. Breathe out normally. 4. Place the mouthpiece in your mouth and seal your lips tightly around it. 5. Breathe in slowly and as deeply as possible, raising the piston or the ball toward the top of the column. 6. Hold your breath for 3-5 seconds or for as long as possible. Allow the piston or ball to fall to the bottom of the column. 7. Remove the mouthpiece from your mouth and breathe out normally. 8. Rest for a few seconds and repeat Steps 1 through 7 at least 10 times every 1-2 hours when you are awake. Take your time and take a few normal breaths between deep breaths. 9. The spirometer may include an indicator to show your best effort. Use the indicator as a goal to work toward during each repetition. 10. After each set of 10 deep breaths, practice coughing to be sure your lungs are clear. If you have an incision (the  cut made at the time of surgery), support your incision when coughing by placing a pillow or rolled up towels firmly against it. Once you are able to get out of bed, walk around indoors and cough well. You may stop using the incentive spirometer when instructed by your caregiver.  RISKS AND COMPLICATIONS  Take your time so you do not get dizzy or light-headed.  If you are in pain, you may need to take or ask for pain medication before doing incentive spirometry. It is harder to take a deep breath if you are having pain. AFTER USE  Rest and breathe slowly and easily.  It can be helpful to keep track of a log of your progress. Your caregiver can provide you with a simple table to help with this. If you are using the spirometer at home, follow these instructions: [place] IF:   You are having difficultly using the spirometer.  You have trouble using the spirometer as often as instructed.  Your pain medication is not giving enough relief while using the spirometer.  You develop fever of 100.5 F (38.1 C) or higher. SEEK IMMEDIATE MEDICAL CARE IF:   You cough up bloody sputum that had not been present before.  You develop fever of 102 F (38.9 C) or greater.  You develop worsening pain at or near the incision site. MAKE SURE YOU:   Understand these instructions.  Will watch your condition.  Will get help right away if you are not doing well or get worse. Document Released: 03/02/2007 Document Revised: 01/12/2012 Document Reviewed: 05/03/2007 [facility] Patient Information 2014 [place], [place].   ________________________________________________________________________

## 2020-04-02 DIAGNOSIS — J40 Bronchitis, not specified as acute or chronic: Secondary | ICD-10-CM | POA: Diagnosis not present

## 2020-04-02 DIAGNOSIS — J189 Pneumonia, unspecified organism: Secondary | ICD-10-CM | POA: Diagnosis not present

## 2020-04-03 ENCOUNTER — Encounter (HOSPITAL_COMMUNITY): Payer: Self-pay

## 2020-04-03 ENCOUNTER — Other Ambulatory Visit: Payer: Self-pay

## 2020-04-03 ENCOUNTER — Encounter (HOSPITAL_COMMUNITY)
Admission: RE | Admit: 2020-04-03 | Discharge: 2020-04-03 | Disposition: A | Discharge status: Home / Self Care | Payer: Medicare Other | Source: Ambulatory Visit | Attending: Surgery | Admitting: Surgery

## 2020-04-03 HISTORY — DX: Cardiomegaly: I51.7

## 2020-04-03 HISTORY — DX: Rosacea, unspecified: L71.9

## 2020-04-03 HISTORY — DX: Sick sinus syndrome: I49.5

## 2020-04-03 HISTORY — DX: Hypothyroidism, unspecified: E03.9

## 2020-04-03 HISTORY — DX: Malignant neoplasm of colon, unspecified: C18.9

## 2020-04-03 HISTORY — DX: Unspecified atrial fibrillation: I48.91

## 2020-04-03 HISTORY — DX: Neutropenia, unspecified: D70.9

## 2020-04-03 HISTORY — DX: Other specified health status: Z78.9

## 2020-04-03 NOTE — Progress Notes (Signed)
COVID Vaccine Completed: Yes Date COVID Vaccine completed: 01/07/20 COVID vaccine manufacturer: Moderna   PCP - Dr. Woody Seller Cardiologist - Dr. Claiborne Billings last office visit and clearance 02/15/20 GI:  Dr. Laural Golden  Chest x-ray - greater than 1 year EKG - 02/15/20 in epic Stress Test - greater than 2 years ECHO - 02/21/20 in epic Cardiac Cath - N/A  Sleep Study - Yes CPAP - No CPAP  Fasting Blood Sugar - N/A Checks Blood Sugar __N/A___ times a day  Blood Thinner Instructions: Eliquis will stop 2 days prior to surgery Aspirin Instructions: N/A Last Dose:N/A  Anesthesia review:  Stroke, HTN  Patient denies shortness of breath, fever, cough and chest pain at PAT appointment   Patient verbalized understanding of instructions that were given to them at the PAT appointment. Patient was also instructed that they will need to review over the PAT instructions again at home before surgery.

## 2020-04-04 ENCOUNTER — Encounter (HOSPITAL_COMMUNITY): Payer: Medicare Other

## 2020-04-10 ENCOUNTER — Other Ambulatory Visit (HOSPITAL_COMMUNITY)
Admission: RE | Admit: 2020-04-10 | Discharge: 2020-04-10 | Disposition: A | Discharge status: Home / Self Care | Payer: Medicare Other | Source: Ambulatory Visit | Attending: Surgery | Admitting: Surgery

## 2020-04-10 ENCOUNTER — Other Ambulatory Visit: Payer: Self-pay

## 2020-04-10 ENCOUNTER — Encounter (HOSPITAL_COMMUNITY)
Admission: RE | Admit: 2020-04-10 | Discharge: 2020-04-10 | Disposition: A | Discharge status: Home / Self Care | Payer: Medicare Other | Source: Ambulatory Visit | Attending: Surgery | Admitting: Surgery

## 2020-04-10 DIAGNOSIS — Z20822 Contact with and (suspected) exposure to covid-19: Secondary | ICD-10-CM | POA: Insufficient documentation

## 2020-04-10 DIAGNOSIS — Z01812 Encounter for preprocedural laboratory examination: Secondary | ICD-10-CM | POA: Insufficient documentation

## 2020-04-10 LAB — CBC WITH DIFFERENTIAL/PLATELET
Abs Immature Granulocytes: 0.02 10*3/uL (ref 0.00–0.07)
Basophils Absolute: 0 10*3/uL (ref 0.0–0.1)
Basophils Relative: 1 %
Eosinophils Absolute: 0.2 10*3/uL (ref 0.0–0.5)
Eosinophils Relative: 3 %
HCT: 34.8 % — ABNORMAL LOW (ref 39.0–52.0)
Hemoglobin: 11.1 g/dL — ABNORMAL LOW (ref 13.0–17.0)
Immature Granulocytes: 0 %
Lymphocytes Relative: 24 %
Lymphs Abs: 1.3 10*3/uL (ref 0.7–4.0)
MCH: 30.5 pg (ref 26.0–34.0)
MCHC: 31.9 g/dL (ref 30.0–36.0)
MCV: 95.6 fL (ref 80.0–100.0)
Monocytes Absolute: 0.5 10*3/uL (ref 0.1–1.0)
Monocytes Relative: 8 %
Neutro Abs: 3.4 10*3/uL (ref 1.7–7.7)
Neutrophils Relative %: 64 %
Platelets: 156 10*3/uL (ref 150–400)
RBC: 3.64 MIL/uL — ABNORMAL LOW (ref 4.22–5.81)
RDW: 15 % (ref 11.5–15.5)
WBC: 5.4 10*3/uL (ref 4.0–10.5)
nRBC: 0 % (ref 0.0–0.2)

## 2020-04-10 LAB — COMPREHENSIVE METABOLIC PANEL
ALT: 21 U/L (ref 0–44)
AST: 30 U/L (ref 15–41)
Albumin: 3.8 g/dL (ref 3.5–5.0)
Alkaline Phosphatase: 75 U/L (ref 38–126)
Anion gap: 7 (ref 5–15)
BUN: 23 mg/dL (ref 8–23)
CO2: 24 mmol/L (ref 22–32)
Calcium: 8.9 mg/dL (ref 8.9–10.3)
Chloride: 110 mmol/L (ref 98–111)
Creatinine, Ser: 1.15 mg/dL (ref 0.61–1.24)
GFR calc Af Amer: >60 mL/min (ref >60)
GFR calc non Af Amer: 55 mL/min — ABNORMAL LOW (ref >60)
Glucose, Bld: 108 mg/dL — ABNORMAL HIGH (ref 70–99)
Potassium: 4.4 mmol/L (ref 3.5–5.1)
Sodium: 141 mmol/L (ref 135–145)
Total Bilirubin: 1.6 mg/dL — ABNORMAL HIGH (ref 0.3–1.2)
Total Protein: 6.7 g/dL (ref 6.5–8.1)

## 2020-04-10 LAB — PROTIME-INR
INR: 1.5 — ABNORMAL HIGH (ref 0.8–1.2)
Prothrombin Time: 17.6 seconds — ABNORMAL HIGH (ref 11.4–15.2)

## 2020-04-10 LAB — HEMOGLOBIN A1C
Hgb A1c MFr Bld: 6.4 % — ABNORMAL HIGH (ref 4.8–5.6)
Mean Plasma Glucose: 136.98 mg/dL

## 2020-04-10 LAB — APTT: aPTT: 40 seconds — ABNORMAL HIGH (ref 24–36)

## 2020-04-10 LAB — SARS CORONAVIRUS 2 (TAT 6-24 HRS): SARS Coronavirus 2: NEGATIVE

## 2020-04-10 NOTE — Progress Notes (Signed)
Pt and PTT results 04/10/2020 faxed to Dr. Dema Severin and Konrad Felix P.A. via epic.

## 2020-04-11 LAB — CEA: CEA: 2.6 ng/mL (ref 0.0–4.7)

## 2020-04-11 NOTE — Anesthesia Preprocedure Evaluation (Addendum)
Anesthesia Evaluation  Patient identified by MRN, date of birth, ID band Patient awake    Reviewed: Allergy & Precautions, NPO status , Patient's Chart, lab work & pertinent test results, reviewed documented beta blocker date and time   Airway Mallampati: I  TM Distance: >3 FB Neck ROM: Full    Dental no notable dental hx. (+) Teeth Intact, Dental Advisory Given   Pulmonary neg pulmonary ROS, former smoker,    Pulmonary exam normal breath sounds clear to auscultation       Cardiovascular hypertension, Pt. on home beta blockers and Pt. on medications +CHF  Normal cardiovascular exam+ dysrhythmias Atrial Fibrillation  Rhythm:Regular Rate:Normal  TTE 2021 Echo 02/21/2020 1. Left ventricular ejection fraction, by estimation, is 45 to 50%. The left ventricle has mildly decreased function. The left ventricle has no regional wall motion abnormalities. Left ventricular diastolic function could not be evaluated.  2. Right ventricular systolic function is mildly reduced. The right ventricular size is moderately enlarged. There is moderately elevated pulmonary artery systolic pressure.  3. Left atrial size was severely dilated.  4. Right atrial size was severely dilated.  5. The mitral valve is normal in structure. Mild mitral valve  regurgitation. No evidence of mitral stenosis.  6. Tricuspid valve regurgitation is moderate.  7. The aortic valve is tricuspid. Aortic valve regurgitation is trivial. Mild to moderate aortic valve sclerosis/calcification is present, without any evidence of aortic stenosis.  8. The inferior vena cava is dilated in size with >50% respiratory variability, suggesting right atrial pressure of 8 mmHg.  Conclusion(s)/Recommendation(s): Visually EF appears low normal, down slightly from EF in 2017. No focal wall motion abnormalities. Otherwise echo largely unchanged.    Neuro/Psych CVA, No Residual Symptoms  negative psych ROS   GI/Hepatic Neg liver ROS, GERD  ,  Endo/Other  Hypothyroidism   Renal/GU negative Renal ROS  negative genitourinary   Musculoskeletal negative musculoskeletal ROS (+)   Abdominal   Peds  Hematology  (+) Blood dyscrasia (on eliquis), ,   Anesthesia Other Findings Colon CA  Reproductive/Obstetrics                           Anesthesia Physical Anesthesia Plan  ASA: III  Anesthesia Plan: General   Post-op Pain Management:    Induction: Intravenous  PONV Risk Score and Plan: 2 and Dexamethasone and Ondansetron  Airway Management Planned: Oral ETT  Additional Equipment:   Intra-op Plan:   Post-operative Plan: Extubation in OR  Informed Consent: I have reviewed the patients History and Physical, chart, labs and discussed the procedure including the risks, benefits and alternatives for the proposed anesthesia with the patient or authorized representative who has indicated his/her understanding and acceptance.     Dental advisory given  Plan Discussed with: CRNA  Anesthesia Plan Comments:        Anesthesia Quick Evaluation

## 2020-04-11 NOTE — Progress Notes (Signed)
Anesthesia Chart Review   Case: [id number] Date/Time: 04/13/20 1145   Procedure: LAPAROSCOPIC SEGMENTAL COLECTOMY, SPLENIC FLEXURE TAKEDOWN (N/A )   Anesthesia type: General   Pre-op diagnosis: COLON CANCER   Location: [place] [facility] / [facility]   Surgeons: Dylan Roup, MD      DISCUSSION:86 y.o. former smoker (quit 08/03/77) with h/o HTN, hypothyroidism, Stroke, atrial fibrillation (on Eliquis), colon cancer scheduled for above procedure 04/13/2020 with Dr. Nadeen York.   Pt last seen by cardiologist, Dr. Shelva York, 02/15/2020 for pre-op evaluation.  Echo ordered to assess LV systolic and diastolic function.  Per OV note clinically appears cardiovascularly stable.  Echo 02/21/2020 with EF 45-50%, mild mitral valve regurgitation, moderate tricuspid valve regurgitation, no AS.  Echo largely unchanged.    Per cardiac pre-operative risk assessment 02/29/2020, "Chart reviewed as part of pre-operative protocol coverage. Given past medical history and time since last visit, based on ACC/AHA guidelines, Dylan York would be at acceptable risk for the planned procedure without further cardiovascular testing.  OK to hold Eliquis two days pre op."  Anticipate pt can proceed with planned procedure barring acute status change.   VS: BP (!) 155/98    Pulse 93    Temp 36.8 C (Oral)    Resp 18    Ht 5\' 8"  (1.727 m)    Wt 63.5 kg    SpO2 97%    BMI 21.29 kg/m   PROVIDERS: Dylan Chroman, MD is PCP   Dylan Majestic, MD is Cardiologist last seen 02/15/2020 LABS: Labs reviewed: Acceptable for surgery. (all labs ordered are listed, but only abnormal results are displayed)  Labs Reviewed  APTT - Abnormal; Notable for the following components:      Result Value   aPTT 40 (*)    All other components within normal limits  CBC WITH DIFFERENTIAL/PLATELET - Abnormal; Notable for the following components:   RBC 3.64 (*)    Hemoglobin 11.1 (*)    HCT 34.8 (*)    All other components within normal  limits  COMPREHENSIVE METABOLIC PANEL - Abnormal; Notable for the following components:   Glucose, Bld 108 (*)    Total Bilirubin 1.6 (*)    GFR calc non Af Amer 55 (*)    All other components within normal limits  HEMOGLOBIN A1C - Abnormal; Notable for the following components:   Hgb A1c MFr Bld 6.4 (*)    All other components within normal limits  PROTIME-INR - Abnormal; Notable for the following components:   Prothrombin Time 17.6 (*)    INR 1.5 (*)    All other components within normal limits  CEA     IMAGES:   EKG: 02/15/2020 Rate 104 bpm  Atrial fibrillation with rapid ventricular response   CV: Echo 02/21/2020 IMPRESSIONS    1. Left ventricular ejection fraction, by estimation, is 45 to 50%. The  left ventricle has mildly decreased function. The left ventricle has no  regional wall motion abnormalities. Left ventricular diastolic function  could not be evaluated.  2. Right ventricular systolic function is mildly reduced. The right  ventricular size is moderately enlarged. There is moderately elevated  pulmonary artery systolic pressure.  3. Left atrial size was severely dilated.  4. Right atrial size was severely dilated.  5. The mitral valve is normal in structure. Mild mitral valve  regurgitation. No evidence of mitral stenosis.  6. Tricuspid valve regurgitation is moderate.  7. The aortic valve is tricuspid. Aortic valve regurgitation is  trivial.  Mild to moderate aortic valve sclerosis/calcification is present, without  any evidence of aortic stenosis.  8. The inferior vena cava is dilated in size with >50% respiratory  variability, suggesting right atrial pressure of 8 mmHg.   Conclusion(s)/Recommendation(s): Visually EF appears low normal, down  slightly from EF in 2017. No focal wall motion abnormalities. Otherwise  echo largely unchanged.  Past Medical History:  Diagnosis Date   Atrial fibrillation (HCC)    Atrial tachycardia (HCC)     Biatrial enlargement    severe biatrial enlargement   BPH (benign prostatic hyperplasia)    Colon cancer (HCC)    Glaucoma    Graded compression stocking in place    Hypertension    Hypothyroidism    Neutropenia (HCC)    Rosacea    Sinus bradycardia, with HR in the 40s BB stopped. 08/08/2013   SSS (sick sinus syndrome) (HCC)    Stroke (HCC)    no residual   Thyroid disease    Tricuspid regurgitation     Past Surgical History:  Procedure Laterality Date   APPENDECTOMY     BACTERIAL OVERGROWTH TEST N/A 01/14/2016   Procedure: BACTERIAL OVERGROWTH TEST;  Surgeon: Dylan Houston, MD;  Location: [facility];  Service: Endoscopy;  Laterality: N/A;  730   BIOPSY  02/09/2020   Procedure: BIOPSY;  Surgeon: Dylan Houston, MD;  [facility]: [facility];  Service: Endoscopy;;   COLONOSCOPY N/A 02/09/2020   Procedure: COLONOSCOPY;  Surgeon: Dylan Houston, MD;  Location: [facility];  Service: Endoscopy;  Laterality: N/A;  1225   HERNIA REPAIR     INGUINAL HERNIA REPAIR Right    POLYPECTOMY  02/09/2020   Procedure: POLYPECTOMY;  Surgeon: Dylan Houston, MD;  Location: [facility];  Service: Endoscopy;;   TRANSURETHRAL RESECTION OF PROSTATE     UMBILICAL HERNIA REPAIR      MEDICATIONS:  COMBIGAN 0.2-0.5 % ophthalmic solution   docusate sodium (COLACE) 100 MG capsule   ELIQUIS 5 MG TABS tablet   finasteride (PROSCAR) 5 MG tablet   levothyroxine (SYNTHROID, LEVOTHROID) 75 MCG tablet   LORazepam (ATIVAN) 1 MG tablet   metoprolol succinate (TOPROL-XL) 50 MG 24 hr tablet   Multiple Vitamins-Minerals (CENTRUM SILVER PO)   Sulfacetamide Sodium-Sulfur 10-5 % CREA   tamsulosin (FLOMAX) 0.4 MG CAPS capsule   Vitamin D, Cholecalciferol, 50 MCG (2000 UT) CAPS   No current facility-administered medications for this encounter.    Dylan York [facility] Pre-Surgical Testing [phone] 04/11/20  12:17 PM

## 2020-04-12 MED ORDER — BUPIVACAINE LIPOSOME 1.3 % IJ SUSP
20.0000 mL | Freq: Once | INTRAMUSCULAR | Status: DC
  Filled 2020-04-12: qty 20

## 2020-04-13 ENCOUNTER — Encounter (HOSPITAL_COMMUNITY): Payer: Self-pay | Admitting: Surgery

## 2020-04-13 ENCOUNTER — Inpatient Hospital Stay (HOSPITAL_COMMUNITY): Payer: Medicare Other | Admitting: Physician Assistant

## 2020-04-13 ENCOUNTER — Inpatient Hospital Stay (HOSPITAL_COMMUNITY): Payer: Medicare Other | Admitting: Certified Registered Nurse Anesthetist

## 2020-04-13 ENCOUNTER — Encounter (HOSPITAL_COMMUNITY)
Admission: RE | Disposition: A | Discharge status: Home / Self Care | Payer: Self-pay | Source: Home / Self Care | Attending: Surgery

## 2020-04-13 ENCOUNTER — Other Ambulatory Visit: Payer: Self-pay

## 2020-04-13 ENCOUNTER — Inpatient Hospital Stay (HOSPITAL_COMMUNITY)
Admission: RE | Admit: 2020-04-13 | Discharge: 2020-04-17 | DRG: 330 | Disposition: A | Discharge status: Home Health | Payer: Medicare Other | Attending: Surgery | Admitting: Surgery

## 2020-04-13 DIAGNOSIS — I119 Hypertensive heart disease without heart failure: Secondary | ICD-10-CM | POA: Diagnosis present

## 2020-04-13 DIAGNOSIS — Z79899 Other long term (current) drug therapy: Secondary | ICD-10-CM

## 2020-04-13 DIAGNOSIS — Z7901 Long term (current) use of anticoagulants: Secondary | ICD-10-CM | POA: Diagnosis not present

## 2020-04-13 DIAGNOSIS — E1165 Type 2 diabetes mellitus with hyperglycemia: Secondary | ICD-10-CM | POA: Diagnosis present

## 2020-04-13 DIAGNOSIS — I5032 Chronic diastolic (congestive) heart failure: Secondary | ICD-10-CM | POA: Diagnosis present

## 2020-04-13 DIAGNOSIS — N400 Enlarged prostate without lower urinary tract symptoms: Secondary | ICD-10-CM | POA: Diagnosis present

## 2020-04-13 DIAGNOSIS — E119 Type 2 diabetes mellitus without complications: Secondary | ICD-10-CM

## 2020-04-13 DIAGNOSIS — C184 Malignant neoplasm of transverse colon: Secondary | ICD-10-CM

## 2020-04-13 DIAGNOSIS — R739 Hyperglycemia, unspecified: Secondary | ICD-10-CM

## 2020-04-13 DIAGNOSIS — E039 Hypothyroidism, unspecified: Secondary | ICD-10-CM | POA: Diagnosis present

## 2020-04-13 DIAGNOSIS — Z8673 Personal history of transient ischemic attack (TIA), and cerebral infarction without residual deficits: Secondary | ICD-10-CM | POA: Diagnosis not present

## 2020-04-13 DIAGNOSIS — C772 Secondary and unspecified malignant neoplasm of intra-abdominal lymph nodes: Secondary | ICD-10-CM | POA: Diagnosis not present

## 2020-04-13 DIAGNOSIS — E785 Hyperlipidemia, unspecified: Secondary | ICD-10-CM | POA: Diagnosis not present

## 2020-04-13 DIAGNOSIS — R1310 Dysphagia, unspecified: Secondary | ICD-10-CM | POA: Diagnosis present

## 2020-04-13 DIAGNOSIS — D509 Iron deficiency anemia, unspecified: Secondary | ICD-10-CM

## 2020-04-13 DIAGNOSIS — I10 Essential (primary) hypertension: Secondary | ICD-10-CM | POA: Diagnosis present

## 2020-04-13 DIAGNOSIS — Z9049 Acquired absence of other specified parts of digestive tract: Secondary | ICD-10-CM | POA: Diagnosis not present

## 2020-04-13 DIAGNOSIS — I480 Paroxysmal atrial fibrillation: Secondary | ICD-10-CM | POA: Diagnosis present

## 2020-04-13 DIAGNOSIS — K219 Gastro-esophageal reflux disease without esophagitis: Secondary | ICD-10-CM | POA: Diagnosis present

## 2020-04-13 DIAGNOSIS — Z87891 Personal history of nicotine dependence: Secondary | ICD-10-CM | POA: Diagnosis not present

## 2020-04-13 DIAGNOSIS — I110 Hypertensive heart disease with heart failure: Secondary | ICD-10-CM | POA: Diagnosis not present

## 2020-04-13 DIAGNOSIS — I4891 Unspecified atrial fibrillation: Secondary | ICD-10-CM | POA: Diagnosis present

## 2020-04-13 DIAGNOSIS — C189 Malignant neoplasm of colon, unspecified: Secondary | ICD-10-CM | POA: Diagnosis not present

## 2020-04-13 DIAGNOSIS — C188 Malignant neoplasm of overlapping sites of colon: Secondary | ICD-10-CM | POA: Diagnosis not present

## 2020-04-13 DIAGNOSIS — Z20822 Contact with and (suspected) exposure to covid-19: Secondary | ICD-10-CM | POA: Diagnosis present

## 2020-04-13 DIAGNOSIS — C185 Malignant neoplasm of splenic flexure: Secondary | ICD-10-CM | POA: Diagnosis present

## 2020-04-13 DIAGNOSIS — C183 Malignant neoplasm of hepatic flexure: Secondary | ICD-10-CM | POA: Diagnosis not present

## 2020-04-13 LAB — MRSA PCR SCREENING: MRSA by PCR: NEGATIVE

## 2020-04-13 SURGERY — COLECTOMY, SIGMOID, ROBOT-ASSISTED
Anesthesia: General | Site: Abdomen

## 2020-04-13 MED ORDER — TRAMADOL HCL 50 MG PO TABS
50.0000 mg | ORAL_TABLET | Freq: Four times a day (QID) | ORAL | Status: DC | PRN
  Administered 2020-04-13 – 2020-04-15 (×2): 50 mg via ORAL
  Filled 2020-04-13 (×2): qty 1

## 2020-04-13 MED ORDER — DIPHENHYDRAMINE HCL 12.5 MG/5ML PO ELIX: 12.5000 mg | ORAL_SOLUTION | Freq: Four times a day (QID) | ORAL | Status: DC | PRN

## 2020-04-13 MED ORDER — SODIUM CHLORIDE (PF) 0.9 % IJ SOLN
INTRAMUSCULAR | Status: DC | PRN
  Administered 2020-04-13: 15 mL

## 2020-04-13 MED ORDER — PHENYLEPHRINE HCL (PRESSORS) 10 MG/ML IV SOLN
INTRAVENOUS | Status: AC
  Filled 2020-04-13: qty 1

## 2020-04-13 MED ORDER — BUPIVACAINE-EPINEPHRINE (PF) 0.5% -1:200000 IJ SOLN
INTRAMUSCULAR | Status: DC | PRN
  Administered 2020-04-13: 15 mL

## 2020-04-13 MED ORDER — METRONIDAZOLE 500 MG PO TABS: 1000.0000 mg | ORAL_TABLET | ORAL | Status: DC

## 2020-04-13 MED ORDER — INDOCYANINE GREEN 25 MG IV SOLR
INTRAVENOUS | Status: DC | PRN
  Administered 2020-04-13: 8.75 mg via INTRAVENOUS

## 2020-04-13 MED ORDER — LACTATED RINGERS IV SOLN
INTRAVENOUS | Status: DC | PRN
Start: 2020-04-13 — End: 2020-04-13

## 2020-04-13 MED ORDER — CHLORHEXIDINE GLUCONATE 0.12 % MT SOLN: 15.0000 mL | Freq: Once | OROMUCOSAL | Status: DC

## 2020-04-13 MED ORDER — PHENYLEPHRINE 40 MCG/ML (10ML) SYRINGE FOR IV PUSH (FOR BLOOD PRESSURE SUPPORT)
PREFILLED_SYRINGE | INTRAVENOUS | Status: AC
  Filled 2020-04-13: qty 10

## 2020-04-13 MED ORDER — BUPIVACAINE LIPOSOME 1.3 % IJ SUSP
INTRAMUSCULAR | Status: DC | PRN
  Administered 2020-04-13: 20 mL

## 2020-04-13 MED ORDER — LIDOCAINE 2% (20 MG/ML) 5 ML SYRINGE
INTRAMUSCULAR | Status: AC
  Filled 2020-04-13: qty 5

## 2020-04-13 MED ORDER — VASOPRESSIN 20 UNIT/ML IV SOLN
INTRAVENOUS | Status: DC | PRN
  Administered 2020-04-13 (×3): 1 [IU] via INTRAVENOUS

## 2020-04-13 MED ORDER — ROCURONIUM BROMIDE 10 MG/ML (PF) SYRINGE
PREFILLED_SYRINGE | INTRAVENOUS | Status: AC
  Filled 2020-04-13: qty 10

## 2020-04-13 MED ORDER — ONDANSETRON HCL 4 MG PO TABS: 4.0000 mg | ORAL_TABLET | Freq: Four times a day (QID) | ORAL | Status: DC | PRN

## 2020-04-13 MED ORDER — 0.9 % SODIUM CHLORIDE (POUR BTL) OPTIME
TOPICAL | Status: DC | PRN
  Administered 2020-04-13: 2000 mL

## 2020-04-13 MED ORDER — BRIMONIDINE TARTRATE-TIMOLOL 0.2-0.5 % OP SOLN
1.0000 [drp] | Freq: Two times a day (BID) | OPHTHALMIC | Status: DC
  Filled 2020-04-13: qty 5

## 2020-04-13 MED ORDER — HEPARIN SODIUM (PORCINE) 5000 UNIT/ML IJ SOLN
5000.0000 [IU] | Freq: Once | INTRAMUSCULAR | Status: AC
  Administered 2020-04-13: 5000 [IU] via SUBCUTANEOUS
  Filled 2020-04-13: qty 1

## 2020-04-13 MED ORDER — ALBUMIN HUMAN 5 % IV SOLN
INTRAVENOUS | Status: AC
  Filled 2020-04-13: qty 250

## 2020-04-13 MED ORDER — PHENYLEPHRINE HCL-NACL 10-0.9 MG/250ML-% IV SOLN
INTRAVENOUS | Status: AC
  Filled 2020-04-13: qty 250

## 2020-04-13 MED ORDER — DIPHENHYDRAMINE HCL 50 MG/ML IJ SOLN: 12.5000 mg | Freq: Four times a day (QID) | INTRAMUSCULAR | Status: DC | PRN

## 2020-04-13 MED ORDER — DEXAMETHASONE SODIUM PHOSPHATE 10 MG/ML IJ SOLN
INTRAMUSCULAR | Status: DC | PRN
  Administered 2020-04-13: 6 mg via INTRAVENOUS

## 2020-04-13 MED ORDER — NEOMYCIN SULFATE 500 MG PO TABS: 1000.0000 mg | ORAL_TABLET | ORAL | Status: DC

## 2020-04-13 MED ORDER — SODIUM CHLORIDE (PF) 0.9 % IJ SOLN
INTRAMUSCULAR | Status: AC
  Filled 2020-04-13: qty 20

## 2020-04-13 MED ORDER — LEVOTHYROXINE SODIUM 75 MCG PO TABS
75.0000 ug | ORAL_TABLET | Freq: Every day | ORAL | Status: DC
  Administered 2020-04-14 – 2020-04-17 (×4): 75 ug via ORAL
  Filled 2020-04-13 (×4): qty 1

## 2020-04-13 MED ORDER — LACTATED RINGERS IR SOLN
Status: DC | PRN
  Administered 2020-04-13: 1000 mL

## 2020-04-13 MED ORDER — ROCURONIUM BROMIDE 10 MG/ML (PF) SYRINGE
PREFILLED_SYRINGE | INTRAVENOUS | Status: DC | PRN
  Administered 2020-04-13: 60 mg via INTRAVENOUS
  Administered 2020-04-13: 10 mg via INTRAVENOUS
  Administered 2020-04-13 (×3): 20 mg via INTRAVENOUS

## 2020-04-13 MED ORDER — ACETAMINOPHEN 500 MG PO TABS
1000.0000 mg | ORAL_TABLET | ORAL | Status: AC
  Administered 2020-04-13: 1000 mg via ORAL
  Filled 2020-04-13: qty 2

## 2020-04-13 MED ORDER — SIMETHICONE 80 MG PO CHEW
40.0000 mg | CHEWABLE_TABLET | Freq: Four times a day (QID) | ORAL | Status: DC | PRN
  Administered 2020-04-14: 40 mg via ORAL
  Filled 2020-04-13: qty 1

## 2020-04-13 MED ORDER — CHLORHEXIDINE GLUCONATE CLOTH 2 % EX PADS
6.0000 | MEDICATED_PAD | Freq: Every day | CUTANEOUS | Status: DC
  Administered 2020-04-13 – 2020-04-15 (×3): 6 via TOPICAL

## 2020-04-13 MED ORDER — HYDROMORPHONE HCL 1 MG/ML IJ SOLN: 0.5000 mg | INTRAMUSCULAR | Status: DC | PRN

## 2020-04-13 MED ORDER — HYDRALAZINE HCL 20 MG/ML IJ SOLN: 5.0000 mg | Freq: Once | INTRAMUSCULAR | Status: DC

## 2020-04-13 MED ORDER — CHLORHEXIDINE GLUCONATE CLOTH 2 % EX PADS: 6.0000 | MEDICATED_PAD | Freq: Once | CUTANEOUS | Status: DC

## 2020-04-13 MED ORDER — ORAL CARE MOUTH RINSE: 15.0000 mL | Freq: Once | OROMUCOSAL | Status: DC

## 2020-04-13 MED ORDER — BRIMONIDINE TARTRATE 0.2 % OP SOLN
1.0000 [drp] | Freq: Two times a day (BID) | OPHTHALMIC | Status: DC
  Administered 2020-04-13 – 2020-04-17 (×8): 1 [drp] via OPHTHALMIC
  Filled 2020-04-13 (×2): qty 5

## 2020-04-13 MED ORDER — HEPARIN SODIUM (PORCINE) 5000 UNIT/ML IJ SOLN
5000.0000 [IU] | Freq: Three times a day (TID) | INTRAMUSCULAR | Status: AC
  Administered 2020-04-13 – 2020-04-17 (×11): 5000 [IU] via SUBCUTANEOUS
  Filled 2020-04-13 (×11): qty 1

## 2020-04-13 MED ORDER — DILTIAZEM HCL-DEXTROSE 125-5 MG/125ML-% IV SOLN (PREMIX)
5.0000 mg/h | INTRAVENOUS | Status: DC
  Filled 2020-04-13: qty 125

## 2020-04-13 MED ORDER — LACTATED RINGERS IV SOLN: INTRAVENOUS | Status: DC

## 2020-04-13 MED ORDER — ONDANSETRON HCL 4 MG/2ML IJ SOLN: 4.0000 mg | Freq: Four times a day (QID) | INTRAMUSCULAR | Status: DC | PRN

## 2020-04-13 MED ORDER — METOPROLOL TARTRATE 5 MG/5ML IV SOLN
INTRAVENOUS | Status: DC | PRN
  Administered 2020-04-13: 3 mg via INTRAVENOUS
  Administered 2020-04-13: 2 mg via INTRAVENOUS

## 2020-04-13 MED ORDER — BUPIVACAINE-EPINEPHRINE (PF) 0.5% -1:200000 IJ SOLN
INTRAMUSCULAR | Status: AC
  Filled 2020-04-13: qty 30

## 2020-04-13 MED ORDER — METOPROLOL SUCCINATE ER 25 MG PO TB24
50.0000 mg | ORAL_TABLET | Freq: Two times a day (BID) | ORAL | Status: DC
  Administered 2020-04-13 – 2020-04-17 (×8): 50 mg via ORAL
  Filled 2020-04-13 (×8): qty 2

## 2020-04-13 MED ORDER — FENTANYL CITRATE (PF) 250 MCG/5ML IJ SOLN
INTRAMUSCULAR | Status: AC
  Filled 2020-04-13: qty 5

## 2020-04-13 MED ORDER — TIMOLOL MALEATE 0.5 % OP SOLN
1.0000 [drp] | Freq: Two times a day (BID) | OPHTHALMIC | Status: DC
  Administered 2020-04-13 – 2020-04-17 (×8): 1 [drp] via OPHTHALMIC
  Filled 2020-04-13 (×2): qty 5

## 2020-04-13 MED ORDER — TAMSULOSIN HCL 0.4 MG PO CAPS
0.4000 mg | ORAL_CAPSULE | Freq: Every day | ORAL | Status: DC
  Administered 2020-04-13 – 2020-04-16 (×4): 0.4 mg via ORAL
  Filled 2020-04-13 (×4): qty 1

## 2020-04-13 MED ORDER — ALBUMIN HUMAN 5 % IV SOLN: INTRAVENOUS | Status: DC | PRN

## 2020-04-13 MED ORDER — ALVIMOPAN 12 MG PO CAPS
12.0000 mg | ORAL_CAPSULE | Freq: Two times a day (BID) | ORAL | Status: DC
  Administered 2020-04-14 – 2020-04-16 (×5): 12 mg via ORAL
  Filled 2020-04-13 (×6): qty 1

## 2020-04-13 MED ORDER — SODIUM CHLORIDE 0.9 % IV SOLN
2.0000 g | INTRAVENOUS | Status: AC
  Administered 2020-04-13: 2 g via INTRAVENOUS
  Filled 2020-04-13: qty 2

## 2020-04-13 MED ORDER — SUGAMMADEX SODIUM 200 MG/2ML IV SOLN
INTRAVENOUS | Status: DC | PRN
  Administered 2020-04-13: 140 mg via INTRAVENOUS

## 2020-04-13 MED ORDER — LIDOCAINE 2% (20 MG/ML) 5 ML SYRINGE
INTRAMUSCULAR | Status: DC | PRN
  Administered 2020-04-13: 1.5 mg/kg/h via INTRAVENOUS

## 2020-04-13 MED ORDER — LIDOCAINE HCL 2 % IJ SOLN
INTRAMUSCULAR | Status: AC
  Filled 2020-04-13: qty 20

## 2020-04-13 MED ORDER — PHENYLEPHRINE HCL-NACL 10-0.9 MG/250ML-% IV SOLN
0.0000 ug/min | INTRAVENOUS | Status: DC
  Administered 2020-04-13: 50 ug/min via INTRAVENOUS

## 2020-04-13 MED ORDER — ORAL CARE MOUTH RINSE
15.0000 mL | Freq: Two times a day (BID) | OROMUCOSAL | Status: DC
  Administered 2020-04-13 – 2020-04-17 (×8): 15 mL via OROMUCOSAL

## 2020-04-13 MED ORDER — PHENYLEPHRINE HCL-NACL 10-0.9 MG/250ML-% IV SOLN
INTRAVENOUS | Status: DC | PRN
Start: 2020-04-13 — End: 2020-04-13
  Administered 2020-04-13: 35 ug/min via INTRAVENOUS

## 2020-04-13 MED ORDER — FENTANYL CITRATE (PF) 250 MCG/5ML IJ SOLN
INTRAMUSCULAR | Status: DC | PRN
  Administered 2020-04-13 (×2): 50 ug via INTRAVENOUS
  Administered 2020-04-13 (×4): 25 ug via INTRAVENOUS

## 2020-04-13 MED ORDER — ONDANSETRON HCL 4 MG/2ML IJ SOLN
INTRAMUSCULAR | Status: AC
  Filled 2020-04-13: qty 2

## 2020-04-13 MED ORDER — DEXAMETHASONE SODIUM PHOSPHATE 10 MG/ML IJ SOLN
INTRAMUSCULAR | Status: AC
  Filled 2020-04-13: qty 1

## 2020-04-13 MED ORDER — ACETAMINOPHEN 500 MG PO TABS
1000.0000 mg | ORAL_TABLET | Freq: Four times a day (QID) | ORAL | Status: DC
  Administered 2020-04-13 – 2020-04-17 (×12): 1000 mg via ORAL
  Filled 2020-04-13 (×13): qty 2

## 2020-04-13 MED ORDER — ENSURE SURGERY PO LIQD
237.0000 mL | Freq: Two times a day (BID) | ORAL | Status: DC
  Administered 2020-04-14 – 2020-04-17 (×7): 237 mL via ORAL
  Filled 2020-04-13 (×8): qty 237

## 2020-04-13 MED ORDER — LIDOCAINE 2% (20 MG/ML) 5 ML SYRINGE
INTRAMUSCULAR | Status: DC | PRN
  Administered 2020-04-13: 40 mg via INTRAVENOUS

## 2020-04-13 MED ORDER — SODIUM CHLORIDE (PF) 0.9 % IJ SOLN
INTRAMUSCULAR | Status: AC
  Filled 2020-04-13: qty 10

## 2020-04-13 MED ORDER — FINASTERIDE 5 MG PO TABS
5.0000 mg | ORAL_TABLET | Freq: Every day | ORAL | Status: DC
  Administered 2020-04-14 – 2020-04-17 (×4): 5 mg via ORAL
  Filled 2020-04-13 (×4): qty 1

## 2020-04-13 MED ORDER — POLYETHYLENE GLYCOL 3350 17 GM/SCOOP PO POWD: 1.0000 | Freq: Once | ORAL | Status: DC

## 2020-04-13 MED ORDER — ALUM & MAG HYDROXIDE-SIMETH 200-200-20 MG/5ML PO SUSP
30.0000 mL | Freq: Four times a day (QID) | ORAL | Status: DC | PRN
  Filled 2020-04-13: qty 30

## 2020-04-13 MED ORDER — PROPOFOL 10 MG/ML IV BOLUS
INTRAVENOUS | Status: DC | PRN
  Administered 2020-04-13: 60 mg via INTRAVENOUS

## 2020-04-13 MED ORDER — PROPOFOL 10 MG/ML IV BOLUS
INTRAVENOUS | Status: AC
  Filled 2020-04-13: qty 20

## 2020-04-13 MED ORDER — ALVIMOPAN 12 MG PO CAPS
12.0000 mg | ORAL_CAPSULE | ORAL | Status: AC
  Administered 2020-04-13: 12 mg via ORAL
  Filled 2020-04-13: qty 1

## 2020-04-13 MED ORDER — PHENYLEPHRINE 40 MCG/ML (10ML) SYRINGE FOR IV PUSH (FOR BLOOD PRESSURE SUPPORT)
PREFILLED_SYRINGE | INTRAVENOUS | Status: DC | PRN
  Administered 2020-04-13: 120 ug via INTRAVENOUS

## 2020-04-13 MED ORDER — BISACODYL 5 MG PO TBEC: 20.0000 mg | DELAYED_RELEASE_TABLET | Freq: Once | ORAL | Status: DC

## 2020-04-13 MED ORDER — ONDANSETRON HCL 4 MG/2ML IJ SOLN
INTRAMUSCULAR | Status: DC | PRN
  Administered 2020-04-13: 4 mg via INTRAVENOUS

## 2020-04-13 MED ORDER — FENTANYL CITRATE (PF) 100 MCG/2ML IJ SOLN: 25.0000 ug | INTRAMUSCULAR | Status: DC | PRN

## 2020-04-13 MED ORDER — VASOPRESSIN 20 UNIT/ML IV SOLN
INTRAVENOUS | Status: AC
  Filled 2020-04-13: qty 1

## 2020-04-13 SURGICAL SUPPLY — 113 items
APPLIER CLIP 5 13 M/L LIGAMAX5 (MISCELLANEOUS)
APPLIER CLIP ROT 10 11.4 M/L (STAPLE)
BLADE EXTENDED COATED 6.5IN (ELECTRODE) IMPLANT
CANNULA REDUC XI 12-8 STAPL (CANNULA) ×3
CANNULA REDUC XI 12-8MM STAPL (CANNULA) ×1
CANNULA REDUCER 12-8 DVNC XI (CANNULA) ×2 IMPLANT
CELLS DAT CNTRL 66122 CELL SVR (MISCELLANEOUS) ×2 IMPLANT
CHLORAPREP W/TINT 26 (MISCELLANEOUS) IMPLANT
CLIP APPLIE 5 13 M/L LIGAMAX5 (MISCELLANEOUS) IMPLANT
CLIP APPLIE ROT 10 11.4 M/L (STAPLE) IMPLANT
CLIP VESOLOCK LG 6/CT PURPLE (CLIP) IMPLANT
CLIP VESOLOCK MED LG 6/CT (CLIP) IMPLANT
COVER SURGICAL LIGHT HANDLE (MISCELLANEOUS) ×4 IMPLANT
COVER TIP SHEARS 8 DVNC (MISCELLANEOUS) ×2 IMPLANT
COVER TIP SHEARS 8MM DA VINCI (MISCELLANEOUS) ×4
COVER WAND RF STERILE (DRAPES) IMPLANT
DECANTER SPIKE VIAL GLASS SM (MISCELLANEOUS) ×4 IMPLANT
DERMABOND ADVANCED (GAUZE/BANDAGES/DRESSINGS) ×2
DERMABOND ADVANCED .7 DNX12 (GAUZE/BANDAGES/DRESSINGS) ×2 IMPLANT
DEVICE TROCAR PUNCTURE CLOSURE (ENDOMECHANICALS) IMPLANT
DRAIN CHANNEL 19F RND (DRAIN) IMPLANT
DRAPE ARM DVNC X/XI (DISPOSABLE) ×8 IMPLANT
DRAPE COLUMN DVNC XI (DISPOSABLE) ×2 IMPLANT
DRAPE DA VINCI XI ARM (DISPOSABLE) ×16
DRAPE DA VINCI XI COLUMN (DISPOSABLE) ×4
DRAPE SURG IRRIG POUCH 19X23 (DRAPES) ×4 IMPLANT
DRSG OPSITE POSTOP 4X10 (GAUZE/BANDAGES/DRESSINGS) IMPLANT
DRSG OPSITE POSTOP 4X6 (GAUZE/BANDAGES/DRESSINGS) ×4 IMPLANT
DRSG OPSITE POSTOP 4X8 (GAUZE/BANDAGES/DRESSINGS) IMPLANT
DRSG TEGADERM 2-3/8X2-3/4 SM (GAUZE/BANDAGES/DRESSINGS) ×20 IMPLANT
DRSG TEGADERM 4X4.75 (GAUZE/BANDAGES/DRESSINGS) ×4 IMPLANT
ELECT REM PT RETURN 15FT ADLT (MISCELLANEOUS) ×4 IMPLANT
ENDOLOOP SUT PDS II  0 18 (SUTURE)
ENDOLOOP SUT PDS II 0 18 (SUTURE) IMPLANT
EVACUATOR SILICONE 100CC (DRAIN) IMPLANT
GAUZE SPONGE 2X2 8PLY STRL LF (GAUZE/BANDAGES/DRESSINGS) ×2 IMPLANT
GAUZE SPONGE 4X4 12PLY STRL (GAUZE/BANDAGES/DRESSINGS) IMPLANT
GLOVE BIO SURGEON STRL SZ 6.5 (GLOVE) ×6 IMPLANT
GLOVE BIO SURGEON STRL SZ7.5 (GLOVE) ×12 IMPLANT
GLOVE BIO SURGEONS STRL SZ 6.5 (GLOVE) ×2
GLOVE BIOGEL PI IND STRL 7.0 (GLOVE) ×8 IMPLANT
GLOVE BIOGEL PI INDICATOR 7.0 (GLOVE) ×8
GLOVE ECLIPSE 6.5 STRL STRAW (GLOVE) ×8 IMPLANT
GLOVE ECLIPSE 8.0 STRL XLNG CF (GLOVE) ×8 IMPLANT
GLOVE INDICATOR 8.0 STRL GRN (GLOVE) ×12 IMPLANT
GLOVE SURG SS PI 7.0 STRL IVOR (GLOVE) ×8 IMPLANT
GOWN STRL REUS W/TWL LRG LVL3 (GOWN DISPOSABLE) ×8 IMPLANT
GOWN STRL REUS W/TWL XL LVL3 (GOWN DISPOSABLE) ×20 IMPLANT
GRASPER SUT TROCAR 14GX15 (MISCELLANEOUS) IMPLANT
HOLDER FOLEY CATH W/STRAP (MISCELLANEOUS) ×4 IMPLANT
KIT PROCEDURE DA VINCI SI (MISCELLANEOUS) ×4
KIT PROCEDURE DVNC SI (MISCELLANEOUS) ×2 IMPLANT
KIT TURNOVER KIT A (KITS) IMPLANT
NEEDLE INSUFFLATION 14GA 120MM (NEEDLE) ×4 IMPLANT
PACK CARDIOVASCULAR III (CUSTOM PROCEDURE TRAY) ×4 IMPLANT
PAD POSITIONING PINK XL (MISCELLANEOUS) ×4 IMPLANT
PENCIL SMOKE EVACUATOR (MISCELLANEOUS) IMPLANT
PORT LAP GEL ALEXIS MED 5-9CM (MISCELLANEOUS) IMPLANT
PROTECTOR NERVE ULNAR (MISCELLANEOUS) ×4 IMPLANT
RELOAD STAPLER 3.5X60 BLU DVNC (STAPLE) ×8 IMPLANT
RTRCTR WOUND ALEXIS 18CM MED (MISCELLANEOUS) ×4
SCISSORS LAP 5X35 DISP (ENDOMECHANICALS) ×4 IMPLANT
SEAL CANN UNIV 5-8 DVNC XI (MISCELLANEOUS) ×8 IMPLANT
SEAL XI 5MM-8MM UNIVERSAL (MISCELLANEOUS) ×16
SEALER VESSEL DA VINCI XI (MISCELLANEOUS) ×4
SEALER VESSEL EXT DVNC XI (MISCELLANEOUS) ×2 IMPLANT
SET IRRIG TUBING LAPAROSCOPIC (IRRIGATION / IRRIGATOR) ×4 IMPLANT
SLEEVE ADV FIXATION 5X100MM (TROCAR) IMPLANT
SOLUTION ELECTROLUBE (MISCELLANEOUS) ×4 IMPLANT
SPONGE GAUZE 2X2 STER 10/PKG (GAUZE/BANDAGES/DRESSINGS) ×2
STAPLER 45 BLU RELOAD XI (STAPLE) IMPLANT
STAPLER 45 BLUE RELOAD XI (STAPLE)
STAPLER 45 GREEN RELOAD XI (STAPLE)
STAPLER 45 GRN RELOAD XI (STAPLE) IMPLANT
STAPLER 60 DA VINCI SURE FORM (STAPLE) ×4
STAPLER 60 SUREFORM DVNC (STAPLE) ×2 IMPLANT
STAPLER CANNULA SEAL DVNC XI (STAPLE) ×2 IMPLANT
STAPLER CANNULA SEAL XI (STAPLE) ×4
STAPLER RELOAD 3.5X60 BLU DVNC (STAPLE) ×8
STAPLER RELOAD 3.5X60 BLUE (STAPLE) ×16
STAPLER SHEATH (SHEATH)
STAPLER SHEATH ENDOWRIST DVNC (SHEATH) IMPLANT
STOPCOCK 4 WAY LG BORE MALE ST (IV SETS) ×8 IMPLANT
SURGILUBE 2OZ TUBE FLIPTOP (MISCELLANEOUS) ×4 IMPLANT
SUT MNCRL AB 4-0 PS2 18 (SUTURE) ×8 IMPLANT
SUT PDS AB 1 CT1 27 (SUTURE) IMPLANT
SUT PDS AB 1 TP1 96 (SUTURE) IMPLANT
SUT PROLENE 0 CT 2 (SUTURE) IMPLANT
SUT PROLENE 2 0 KS (SUTURE) ×4 IMPLANT
SUT PROLENE 2 0 SH DA (SUTURE) IMPLANT
SUT SILK 2 0 (SUTURE)
SUT SILK 2 0 SH CR/8 (SUTURE) IMPLANT
SUT SILK 2-0 18XBRD TIE 12 (SUTURE) IMPLANT
SUT SILK 3 0 (SUTURE) ×4
SUT SILK 3 0 SH CR/8 (SUTURE) IMPLANT
SUT SILK 3-0 18XBRD TIE 12 (SUTURE) ×2 IMPLANT
SUT V-LOC BARB 180 2/0GR6 GS22 (SUTURE) ×8
SUT VIC AB 3-0 SH 18 (SUTURE) IMPLANT
SUT VIC AB 3-0 SH 27 (SUTURE)
SUT VIC AB 3-0 SH 27XBRD (SUTURE) IMPLANT
SUT VICRYL 0 UR6 27IN ABS (SUTURE) ×8 IMPLANT
SUTURE V-LC BRB 180 2/0GR6GS22 (SUTURE) ×4 IMPLANT
SYR 10ML LL (SYRINGE) ×4 IMPLANT
SYS LAPSCP GELPORT 120MM (MISCELLANEOUS)
SYSTEM LAPSCP GELPORT 120MM (MISCELLANEOUS) IMPLANT
TAPE UMBILICAL COTTON 1/8X30 (MISCELLANEOUS) IMPLANT
TOWEL OR NON WOVEN STRL DISP B (DISPOSABLE) ×4 IMPLANT
TRAY COLON PACK (CUSTOM PROCEDURE TRAY) ×4 IMPLANT
TRAY FOLEY MTR SLVR 16FR STAT (SET/KITS/TRAYS/PACK) ×4 IMPLANT
TROCAR ADV FIXATION 5X100MM (TROCAR) ×4 IMPLANT
TUBING CONNECTING 10 (TUBING) ×6 IMPLANT
TUBING CONNECTING 10' (TUBING) ×2
TUBING INSUFFLATION 10FT LAP (TUBING) ×4 IMPLANT

## 2020-04-13 NOTE — Anesthesia Postprocedure Evaluation (Signed)
Anesthesia Post Note  Patient: Dylan York  Procedure(s) Performed: XI ROBOT ASSISTED SEGMENTAL COLECTOMY, TAKEDOWN OF SPLENIC FLEXURE, BILATERAL TAP BLOCK (N/A Abdomen)     Patient location during evaluation: PACU Anesthesia Type: General Level of consciousness: awake and alert, awake and oriented Pain management: pain level controlled Vital Signs Assessment: post-procedure vital signs reviewed and stable Respiratory status: spontaneous breathing, nonlabored ventilation and respiratory function stable Cardiovascular status: blood pressure returned to baseline and stable Postop Assessment: no apparent nausea or vomiting Anesthetic complications: no   No complications documented.  Last Vitals:  Vitals:   04/13/20 1750 04/13/20 1755  BP: (!) 143/97 (!) 140/103  Pulse:    Resp:    Temp:    SpO2:      Last Pain:  Vitals:   04/13/20 1745  TempSrc:   PainSc: 0-No pain                 Catalina Gravel

## 2020-04-13 NOTE — Anesthesia Procedure Notes (Signed)
Procedure Name: Intubation Date/Time: 04/13/2020 12:35 PM Performed by: Maxwell Caul, CRNA Pre-anesthesia Checklist: Patient identified, Emergency Drugs available, Suction available and Patient being monitored Patient Re-evaluated:Patient Re-evaluated prior to induction Oxygen Delivery Method: Circle system utilized Preoxygenation: Pre-oxygenation with 100% oxygen Induction Type: IV induction Ventilation: Mask ventilation without difficulty Laryngoscope Size: Mac and 4 Grade View: Grade I Tube type: Oral Tube size: 7.5 mm Number of attempts: 1 Airway Equipment and Method: Stylet Placement Confirmation: ETT inserted through vocal cords under direct vision,  positive ETCO2 and breath sounds checked- equal and bilateral Secured at: 21 cm Tube secured with: Tape Dental Injury: Teeth and Oropharynx as per pre-operative assessment

## 2020-04-13 NOTE — Op Note (Signed)
04/13/2020  4:49 PM  PATIENT:  Dylan York  86 y.o. male  Patient Care Team: Glenda Chroman, MD as PCP - General (Internal Medicine) Troy Sine, MD as PCP - Cardiology (Cardiology) Myrlene Broker, MD as Attending Physician (Urology)  PRE-OPERATIVE DIAGNOSIS:  Colon cancer  POST-OPERATIVE DIAGNOSIS:  Same  PROCEDURE:   1. Robotic assisted segmental colectomy 2. Takedown of splenic flexure 3.  Intraoperative assessment of perfusion 4. Bilateral transversus abdominus plane blocks  SURGEON:  Sharon Mt. Dema Severin, MD  ASSISTANT: Michael Boston, MD  ANESTHESIA:   local and general  COUNTS:  Sponge, needle and instrument counts were reported correct x2 at the conclusion of the operation.  EBL: 50 mL  DRAINS: None  SPECIMEN: Distal transverse, splenic flexure, descending colon all as one unit  COMPLICATIONS: None  FINDINGS: Bulky tumor in mid/distal transverse colon approaching splenic flexure. Segmental colectomy carried out after mobilizing splenic flexure including left branch of middle colic vessels as well as left colic; IMA preserved. ICG perfusion demonstrated excellent uptake on both proximal and distal ends of the colon at staple lines. Transverse to proximal sigmoid intracorporeal anastomosis fashioned.   DISPOSITION: PACU in satisfactory condition  INDICATION: Dylan York is a very pleasant 78yoM with hx of Afib, hypothyroidism, prior TIA (thought to be 2/2 afib off anticoagulation) who was referred to our office by Dr. Laural Golden following his colonoscopy.  1 to 2 weeks prior to his colonoscopy he was experiencing bright red blood per rectum.  This was occurring while on an anticoagulant.  He has to take his anticoagulant as he has a history of TIA that was thought to be secondary to A. fib.  He underwent colonoscopy 02/09/20 which demonstrated a polypoid ulcerated nonobstructing mass in the splenic flexure.  This was biopsied and returned adenocarcinoma.  He underwent  staging CT chest/abdomen/pelvis that did not demonstrate evidence of metastatic cancer.  We had met in the office and had a long discussion about options going forward.  Certainly with his advanced age, concerns exist for significant perioperative risks including mortality, stroke, or cardiac events.  He was able to obtain cardiac clearance and after considering all of his options opted to proceed with surgery.  Please refer to notes elsewhere for details regarding the discussions.  DESCRIPTION: The patient was identified in preop holding and taken to the OR where he was placed on the operating room table. SCDs were placed. General endotracheal anesthesia was induced without difficulty.  A Foley catheter was placed under sterile conditions.  He was then positioned in lithotomy using Allen stirrups.  Pressure points were then verified and padded.  He was then prepped and draped in the usual sterile fashion. A surgical timeout was performed indicating the correct patient, procedure, positioning and need for preoperative antibiotics.   An OG tube in place of anesthesia and was confirmed to be to suction.  At Palmer's point, a stab incision was created and the Veress needle introduced into the abdomen on the first attempt.  Intraperitoneal location was confirmed with the aspiration and saline drop test.  Pneumoperitoneum was established to a maximum pressure of 15 mmHg with CO2.  In the right hemiabdomen, lateral to all of his other prior procedures, an 8 mm blunt tipped robotic trocar was carefully placed.  The laparoscope was inserted and demonstrated no evidence of Veress needle site or trocar site complications.  2 additional 8 mm trochars were placed in the right hemiabdomen and 3 fingerbreadths above the pubic  symphysis, a 12 mm robotic trocar was placed.  The abdomen was surveyed.  The tumor was identified in the mid/distal transverse colon.  There was some desmoplastic reaction around it with the involved  mesentery and omentum but there was no evidence of tumor spread.  The liver was normal in appearance.  The peritoneal surface of the abdomen was normal in appearance.  A 5 mm assist port was placed in the lateral right abdomen.  Bilateral transversus abdominis plane blocks were then created using a dilute mixture of Exparel and Marcaine.  He was positioned in slight reverse Trendelenburg with some left side up.  The robot was then docked.  I then went to the console.  The tumor was identified and the greater omentum reflected anteriorly while the transverse colon was retracted caudad.  The lesser sac was entered and the omentum mobilized off of the transverse colon out to the level of the splenic flexure.  The sigmoid colon was then identified.  The sigmoid colon was mobilized off of the intersigmoid fossa.  The descending colon and associated mesocolon was mobilized medially by dividing the Alphonse Asbridge line of Toldt up to the level of the splenic flexure.  The colon was then mobilized medially by taking the splenic flexure down in its entirety.  Attention was then turned to the proximal point of transection.  The transverse colon was elevated anteriorly.  The mesentery was evaluated.  The middle colic was proximal to the tumor and taking the left branch middle colic appeared to provide adequate oncologic margins as well as vascular supply.  This worked to preserve the middle colic pedicle to the transverse colon.  A window was created the mesentery and the mesentery was divided including the left branch of the middle colic using the vessel sealer.  The mesentery was then divided all the way down to the level of the descending colon as he had no dominant vascular supply between his IMA and the left branch of his middle colic.  He did have a diminutive appearing left colic artery.  With all these findings, the plan is to proceed with a transverse colon to proximal sigmoid colon anastomosis.  After dividing all the  mesentery, the distal point of transection was identified at the junction of the descending colon and sigmoid colon.  After the mesentery had been completely cleared, attention was turned to performing an intraoperative assessment of perfusion.  ICG was administered and there was excellent uptake of the colon out to the cleared segments both proximally and distally.  The proximal and distal segments were then transected using a robotic 60 mm blue load stapler.  The mesenteric edge was then inspected and noted to be hemostatic.  Attention was then turned to creating the anastomosis.  Orientation was confirmed such that there is no twisting of the colon.  The mid transverse colon was brought in line with the sigmoid colon and the tube came together without any tension.  An isoperistaltic side-to-side, functional end-to-end anastomosis was then created using a 60 mm blue load robotic stapler.  The staple line was inspected and noted to be intact and hemostatic.  The common colotomy was then closed using a 2-0 V-Loc running towards the center in a canal manner with 2 separate sutures.  The closure was then carefully inspected and noted to be complete.  The abdomen was then irrigated and hemostasis was verified.  The trochars were removed under direct visualization and hemostasis appreciated.  The robot was then undocked and  I scrubbed back in.  Incorporating a 12 mm robotic trocar, a Pfannenstiel incision was created.  The skin was incised and subcutaneous tissue divided.  The rectus fascia was incised and flaps created cephalad and caudad.  The peritoneum was incised.  An Connery Shiffler [place] wound protector was placed.  Towels were placed around the field.  The colon specimen was removed and passed off.  The proximal end is closest to the tumor and the distal end is much further away.  The wound protector was removed and gloves changed.  Attention was then turned to closing the abdomen.  The peritoneum was closed with a  running 0 Vicryl suture.  The rectus fascia was then closed using 2 running #1 PDS sutures.  The fascia was then palpated and noted to be completely closed.  Additional anesthetic was infiltrated around this incision.  The skin of all incision sites was closed with 4-0 Monocryl subcuticular suture.  Dermabond was then applied to all incisions.  He was then taken out of lithotomy, awakened from anesthesia, extubated, and transferred to a stretcher for transport to PACU in satisfactory condition

## 2020-04-13 NOTE — H&P (Signed)
CC: Referred by Dr. Laural Golden - distal transverse colon cancer   HPI: Mr. Lanzo is a very pleasant 86yoM with hx of Afib, hypothyroidism, TIA (reportedly 2/2 irregular heart beat - started anticoagulation following) and presented to our office as a referral by Dr. Laural Golden.  Approximately 1-2 weeks prior to his colonoscopy, he experienced bright red blood per rectum after bowel movement.  This is an isolated event and hasn't occurred since.  He underwent colonoscopy for this reason for/8/21 which demonstrated a polypoid ulcerated nonobstructing mass in the vicinity of the splenic flexure.  This was biopsied.  Pathology returned adenocarcinoma.  Diverticula, few, sigmoid colon.  4 mm rectal polyp.  He underwent staging CT abdomen and pelvis that has yet to undergo CT chest.  The abdomen and pelvis demonstrated the mass to be in the distal transverse colon concordant with endoscopic location-on CT it was a 4 cm apple core lesion.  No findings in the abdomen suspicious for metastatic disease.  He is here today for follow-up.  He denies any complaints.  He denies any abdominal pain, nausea/vomiting.  He is having regular bowel movements.  His weight was down to 130lbs but is up to 135 today.  He is on Eliquis but denies any blood in his stool this time.  He denies any changes in his health or health history since we met in the office. He has been off his Eliquis as per cardiology as well. He took his prep with satisfactory result.    PMH: Afib (on Eliquis - follows with Dr. Claiborne Billings); hypothyroidism; TIA   PSH: Open appendectomy at the age of 86; umbilical/ventral hernia repair with mesh.  He has both a transverse and a midline incision around his umbilicus.   FHx: Denies FHx of colorectal, breast, endometrial, ovarian or cervical cancer   Social: Denies use of tobacco/EtOH/drugs.  Quit using tobacco products in 1978.  He previously worked with [employer], retired in 1989.  He was at home with his wife,  Danton Clap whom he helps take care of - she is in a wheel chair.   ROS: A comprehensive 10 system review of systems was completed with the patient and pertinent findings as noted above.  Past Medical History:  Diagnosis Date   Atrial fibrillation (HCC)    Atrial tachycardia (HCC)    Biatrial enlargement    severe biatrial enlargement   BPH (benign prostatic hyperplasia)    Colon cancer (HCC)    Glaucoma    Graded compression stocking in place    Hypertension    Hypothyroidism    Neutropenia (HCC)    Rosacea    Sinus bradycardia, with HR in the 40s BB stopped. 08/08/2013   SSS (sick sinus syndrome) (HCC)    Stroke (HCC)    no residual   Thyroid disease    Tricuspid regurgitation     Past Surgical History:  Procedure Laterality Date   APPENDECTOMY     BACTERIAL OVERGROWTH TEST N/A 01/14/2016   Procedure: BACTERIAL OVERGROWTH TEST;  Surgeon: Rogene Houston, MD;  Location: [facility];  Service: Endoscopy;  Laterality: N/A;  730   BIOPSY  02/09/2020   Procedure: BIOPSY;  Surgeon: Rogene Houston, MD;  Location: [facility];  Service: Endoscopy;;   COLONOSCOPY N/A 02/09/2020   Procedure: COLONOSCOPY;  Surgeon: Rogene Houston, MD;  Location: [facility];  Service: Endoscopy;  Laterality: N/A;  [place] Right  POLYPECTOMY  02/09/2020   Procedure: POLYPECTOMY;  Surgeon: Rogene Houston, MD;  Location: [facility];  Service: Endoscopy;;   TRANSURETHRAL RESECTION OF PROSTATE     UMBILICAL HERNIA REPAIR      Family History  Problem Relation Age of Onset   Cancer Mother    Heart Problems Father        atherosclerosis   Cancer - Prostate Paternal Grandfather     Social:  reports that he quit smoking about 42 years ago. He has never used smokeless tobacco. He reports that he does not drink alcohol and does not use drugs.  Allergies:  Allergies  Allergen Reactions   Amiodarone Other (See Comments)    Not good for liver   Multaq  [Dronedarone] Shortness Of Breath    Medications: I have reviewed the patient's current medications.  No results found for this or any previous visit (from the past 48 hour(s)).  No results found.  ROS - all of the below systems have been reviewed with the patient and positives are indicated with bold text General: chills, fever or night sweats Eyes: blurry vision or double vision ENT: epistaxis or sore throat Allergy/Immunology: itchy/watery eyes or nasal congestion Hematologic/Lymphatic: bleeding problems, blood clots or swollen lymph nodes Endocrine: temperature intolerance or unexpected weight changes Breast: new or changing breast lumps or nipple discharge Resp: cough, shortness of breath, or wheezing CV: chest pain or dyspnea on exertion GI: as per HPI GU: dysuria, trouble voiding, or hematuria MSK: joint pain or joint stiffness Neuro: TIA or stroke symptoms Derm: pruritus and skin lesion changes Psych: anxiety and depression  PE There were no vitals taken for this visit. Constitutional: NAD; conversant; wearing mask Eyes: Moist conjunctiva; no lid lag; anicteric Lungs: Normal respiratory effort CV: irreg rate/rhythm GI: Abd soft, NT/ND; no palpable hepatosplenomegaly Psychiatric: Appropriate affect; alert and oriented x3  No results found for this or any previous visit (from the past 48 hour(s)).  No results found.   A/P: Mr. Pistilli is a very pleasant 86yoM with hx TIA, afib (on Eliquis), hypothyroidism - clear with a newly diagnosed adenocarcinoma in the distal transverse colon in close proximity to the splenic flexure  -CT abdomen and pelvis demonstrated no evidence of metastatic disease but he has not had a CT chest yet. The mass was found to be in this location on his CT scan (which I reviewed) and endoscopically.  -The anatomy and physiology of the GI tract was discussed at length with the patient. The pathophysiology of colon cancer was discussed at length with  associated pictures as well. -We discussed options going forward including surgery-laparoscopic/robotic (we reviewed this today) and potential open techniques to a segmental colectomy at this location. Possible flexible sigmoidoscopy. We discussed other options including no surgery or even chemotherapy but those not having any sort of curative intent. We specifically re-addressed perioperative risk of mortality, heart attack, stroke given his age of 86 years old -The procedure, material risks (including, but not limited to, pain, bleeding, infection, scarring, need for blood transfusion, damage to surrounding structures- blood vessels/nerves/viscus/organs, damage to ureter, urine leak, leak from anastomosis, need for additional procedures, worsening of pre-existing medical conditions, potential need for stoma which may be permanent, hernia, recurrence of cancer, DVT/PE, pneumonia, heart attack, stroke or TIA, death) benefits and alternatives to surgery were discussed at length. The patient's questions were answered to his satisfaction, he voiced understanding and elected to proceed with surgery. Additionally, we discussed typical postoperative expectations and the  recovery process. We also discussed the potential increased risks as noted above in relation to his advanced age. That said, he does appear to have a good functional status, cares for himself, and ambulates without the aid of assistive devices. Possibility of discharge to rehab/skilled nursing care as well -Cardiac clearance obtained  Sharon Mt. Dema Severin, M.D. [facility] Use [email or web address] to contact on call provider

## 2020-04-13 NOTE — Transfer of Care (Signed)
Immediate Anesthesia Transfer of Care Note  Patient: Dylan York  Procedure(s) Performed: XI ROBOT ASSISTED SEGMENTAL COLECTOMY, TAKEDOWN OF SPLENIC FLEXURE, BILATERAL TAP BLOCK (N/A Abdomen)  Patient Location: PACU  Anesthesia Type:General  Level of Consciousness: drowsy  Airway & Oxygen Therapy: Patient Spontanous Breathing and Patient connected to face mask oxygen  Post-op Assessment: Report given to RN and Post -op Vital signs reviewed and stable  Post vital signs: Reviewed and stable  Last Vitals:  Vitals Value Taken Time  BP 141/103 04/13/20 1645  Temp 36.3 C 04/13/20 1645  Pulse 60 04/13/20 1650  Resp 15 04/13/20 1650  SpO2 96 % 04/13/20 1650  Vitals shown include unvalidated device data.  Last Pain:  Vitals:   04/13/20 1027  TempSrc: Oral      Patients Stated Pain Goal: 3 (28/78/67 6720)  Complications: No complications documented.

## 2020-04-13 NOTE — Consult Note (Addendum)
Medical Consultation   Dylan York  ZTI:[id number]  DOB: 1926/04/23  DOA: 04/13/2020   Requesting physician: Nadeen Landau  Reason for consultation: Medical management of atrial fibrillation   History of Present Illness: Dylan York is an 86 y.o. male with past medical history of proximal A. fib, BPH, hypothyroidism who has been requested for medical consult by general surgery.  He underwent segmental colectomy today by general surgery.  He reported blood red blood per rectum after bowel movement about 2 weeks ago.  He underwent colonoscopy for this reason on 04/10/2020 which demonstrated a polypoid ulcerated nonobstructing mass in the vicinity of the splenic flexure.  He underwent biopsy which came out to be adenocarcinoma.  He underwent staging CT abdomen/pelvis with showed the mass to be in distal transverse colon, no findings of metastatic disease.  He was referred to general surgery and he underwent segmental colectomy today. He has history of paroxysmal A. fib and he is on Eliquis for anticoagulation, takes metoprolol for rate control. Patient seen and examined in the PACU.  Currently he is hemodynamically stable and afib with mild RVR.As per Dr. Dema Severin, his surgery was uneventful. Patient was awake and alert but confused during my evaluation.  He was just waking of from the effect of anesthesia.  He complained of some abdominal soreness but denied any chest pain or shortness of breath, nausea or vomiting.   Review of Systems:  ROS As per HPI otherwise 10 point review of systems negative.    Past Medical History: Past Medical History:  Diagnosis Date  . Atrial fibrillation ([place])   . Atrial tachycardia ([place])   . Biatrial enlargement    severe biatrial enlargement  . BPH (benign prostatic hyperplasia)   . Colon cancer ([place])   . Glaucoma   . Graded compression stocking in place   . Hypertension   . Hypothyroidism   . Neutropenia ([place])   . Rosacea   .  Sinus bradycardia, with HR in the 40s BB stopped. 08/08/2013  . SSS (sick sinus syndrome) ([place])   . Stroke [facility])    no residual  . Thyroid disease   . Tricuspid regurgitation     Past Surgical History: Past Surgical History:  Procedure Laterality Date  . APPENDECTOMY    . BACTERIAL OVERGROWTH TEST N/A 01/14/2016   Procedure: BACTERIAL OVERGROWTH TEST;  Surgeon: Rogene Houston, MD;  Location[facility];  Service: Endoscopy;  Laterality: N/A;  730  . BIOPSY  02/09/2020   Procedure: BIOPSY;  Surgeon: Rogene Houston, MD;  Location: [facility];  Service: Endoscopy;;  . COLONOSCOPY N/A 02/09/2020   Procedure: COLONOSCOPY;  Surgeon: Rogene Houston, MD;  Location: [facility];  Service: Endoscopy;  Laterality: N/A;  1225  . HERNIA REPAIR    . INGUINAL HERNIA REPAIR Right   . POLYPECTOMY  02/09/2020   Procedure: POLYPECTOMY;  Surgeon: Rogene Houston, MD;  Location: [facility];  Service: Endoscopy;;  . TRANSURETHRAL RESECTION OF PROSTATE    . UMBILICAL HERNIA REPAIR       Allergies:   Allergies  Allergen Reactions  . Amiodarone Other (See Comments)    Not good for liver  . Multaq [Dronedarone] Shortness Of Breath     Social History:  reports that he quit smoking about 42 years ago. He has never used smokeless tobacco. He reports that he does not drink alcohol and  does not use drugs.   Family History: Family History  Problem Relation Age of Onset  . Cancer Mother   . Heart Problems Father        atherosclerosis  . Cancer - Prostate Paternal Grandfather       Physical Exam: Vitals:   04/13/20 1645 04/13/20 1657 04/13/20 1700 04/13/20 1715  BP: (!) 141/103 109/84 (!) 122/108   Pulse: 83  (!) 106 77  Resp: (!) 9  18 16   Temp: (!) 97.4 F (36.3 C)     TempSrc:      SpO2: 96%  97% 96%  Weight:      Height:        General exam: Not in distress,elderly gentleman HEENT:PERRL,Oral mucosa moist, Ear/Nose normal on gross exam Respiratory system: Bilateral  equal air entry, normal vesicular breath sounds, no wheezes or crackles  Cardiovascular system: Afib with RVR. No JVD, murmurs, rubs, gallops or clicks. Gastrointestinal system: Abdomen is nondistended, soft and is appropriately tender.  Clean surgical wounds. No organomegaly or masses felt. Normal bowel sounds heard. Central nervous system: Alert and awake but not oriented Extremities: No edema, no clubbing ,no cyanosis Skin: No rashes, lesions or ulcers,no icterus ,no pallor  Data reviewed:  I have personally reviewed following labs and imaging studies Labs:  CBC: Recent Labs  Lab 04/10/20 1401  WBC 5.4  NEUTROABS 3.4  HGB 11.1*  HCT 34.8*  MCV 95.6  PLT 485    Basic Metabolic Panel: Recent Labs  Lab 04/10/20 1401  NA 141  K 4.4  CL 110  CO2 24  GLUCOSE 108*  BUN 23  CREATININE 1.15  CALCIUM 8.9   GFR Estimated Creatinine Clearance: 37.6 mL/min (by C-G formula based on SCr of 1.15 mg/dL). Liver Function Tests: Recent Labs  Lab 04/10/20 1401  AST 30  ALT 21  ALKPHOS 75  BILITOT 1.6*  PROT 6.7  ALBUMIN 3.8   No results for input(s): LIPASE, AMYLASE in the last 168 hours. No results for input(s): AMMONIA in the last 168 hours. Coagulation profile Recent Labs  Lab 04/10/20 1401  INR 1.5*    Cardiac Enzymes: No results for input(s): CKTOTAL, CKMB, CKMBINDEX, TROPONINI in the last 168 hours. BNP: Invalid input(s): POCBNP CBG: No results for input(s): GLUCAP in the last 168 hours. D-Dimer No results for input(s): DDIMER in the last 72 hours. Hgb A1c No results for input(s): HGBA1C in the last 72 hours. Lipid Profile No results for input(s): CHOL, HDL, LDLCALC, TRIG, CHOLHDL, LDLDIRECT in the last 72 hours. Thyroid function studies No results for input(s): TSH, T4TOTAL, T3FREE, THYROIDAB in the last 72 hours.  Invalid input(s): FREET3 Anemia work up No results for input(s): VITAMINB12, FOLATE, FERRITIN, TIBC, IRON, RETICCTPCT in the last 72  hours. Urinalysis    Component Value Date/Time   COLORURINE YELLOW 09/07/2016 1143   APPEARANCEUR CLEAR 09/07/2016 1143   LABSPEC 1.010 09/07/2016 1143   PHURINE 6.0 09/07/2016 1143   GLUCOSEU NEGATIVE 09/07/2016 1143   HGBUR NEGATIVE 09/07/2016 1143   BILIRUBINUR NEGATIVE 09/07/2016 [place] 09/07/2016 1143   PROTEINUR NEGATIVE 09/07/2016 1143   NITRITE NEGATIVE 09/07/2016 [place] 09/07/2016 1143     Microbiology Recent Results (from the past 240 hour(s))  SARS CORONAVIRUS 2 (TAT 6-24 HRS) Nasopharyngeal Nasopharyngeal Swab     Status: None   Collection Time: 04/10/20  1:01 PM   Specimen: Nasopharyngeal Swab  Result Value Ref Range Status   SARS Coronavirus 2  NEGATIVE NEGATIVE Final    Comment: (NOTE) SARS-CoV-2 target nucleic acids are NOT DETECTED. The SARS-CoV-2 RNA is generally detectable in upper and lower respiratory specimens during the acute phase of infection. Negative results do not preclude SARS-CoV-2 infection, do not rule out co-infections with other pathogens, and should not be used as the sole basis for treatment or other patient management decisions. Negative results must be combined with clinical observations, patient history, and epidemiological information. The expected result is Negative. Fact Sheet for Patients: SugarRoll.be Fact Sheet for Healthcare Providers: https://www.woods-mathews.com/ This test is not yet approved or cleared by the [place] FDA and  has been authorized for detection and/or diagnosis of SARS-CoV-2 by FDA under an Emergency Use Authorization (EUA). This EUA will remain  in effect (meaning this test can be used) for the duration of the COVID-19 declaration under Section 56 4(b)(1) of the Act, 21 U.S.C. section 360bbb-3(b)(1), unless the authorization is terminated or revoked sooner. Performed at [facility], [employer] [address]  [place], [place] [address]        Inpatient Medications:   Scheduled Meds: . bisacodyl  20 mg Oral Once  . bupivacaine liposome  20 mL Infiltration Once  . chlorhexidine  15 mL Mouth/Throat Once   Or  . mouth rinse  15 mL Mouth Rinse Once  . Chlorhexidine Gluconate Cloth  6 each Topical Once   And  . Chlorhexidine Gluconate Cloth  6 each Topical Once   Continuous Infusions: . diltiazem (CARDIZEM) infusion    . lactated ringers    . phenylephrine (NEO-SYNEPHRINE) Adult infusion       Radiological Exams on Admission: No results found.  Impression/Recommendations Active Problems:   PAF (paroxysmal atrial fibrillation) (HCC)   S/P laparoscopic colectomy  Adenocarcinoma of the distal transverse colon: As per the report of biopsy done at colonoscopy.  He had a bright red blood per rectum about 2 weeks ago. CT abdomen/pelvis did not show any evidence of metastatic lesions.  He underwent segmental colectomy by general surgery today. Further management as per general surgery.  Paroxysmal A. fib: Takes Eliquis for anticoagulation and metoprolol for rate control.  Metoprolol will be started tomorrow.  General surgery recommends to start Eliquis on Sunday.  Continue monitoring in telemetry. He was in A. fib with RVR during the time of surgery and was started on Cardizem drip.  We will continue to taper the drip and stop and start on metoprolol tomorrow.  Hypothyroidism: Continue levothyroxine  BPH: On Proscar and Flomax at home,which can be continued.    Thank you for this consultation.  Our [facility] hospitalist team will follow the patient with you.   Time Spent: 45 mins.  Shelly Coss M.D. [facility] 04/13/2020, 5:33 PM

## 2020-04-14 DIAGNOSIS — C184 Malignant neoplasm of transverse colon: Principal | ICD-10-CM

## 2020-04-14 DIAGNOSIS — R739 Hyperglycemia, unspecified: Secondary | ICD-10-CM

## 2020-04-14 DIAGNOSIS — E119 Type 2 diabetes mellitus without complications: Secondary | ICD-10-CM

## 2020-04-14 LAB — GLUCOSE, CAPILLARY
Glucose-Capillary: 149 mg/dL — ABNORMAL HIGH (ref 70–99)
Glucose-Capillary: 154 mg/dL — ABNORMAL HIGH (ref 70–99)
Glucose-Capillary: 120 mg/dL — ABNORMAL HIGH (ref 70–99)

## 2020-04-14 LAB — CBC WITH DIFFERENTIAL/PLATELET
Abs Immature Granulocytes: 0.02 10*3/uL (ref 0.00–0.07)
Basophils Absolute: 0 10*3/uL (ref 0.0–0.1)
Basophils Relative: 0 %
Eosinophils Absolute: 0 10*3/uL (ref 0.0–0.5)
Eosinophils Relative: 0 %
HCT: 29.3 % — ABNORMAL LOW (ref 39.0–52.0)
Hemoglobin: 9.3 g/dL — ABNORMAL LOW (ref 13.0–17.0)
Immature Granulocytes: 0 %
Lymphocytes Relative: 10 %
Lymphs Abs: 0.5 10*3/uL — ABNORMAL LOW (ref 0.7–4.0)
MCH: 30 pg (ref 26.0–34.0)
MCHC: 31.7 g/dL (ref 30.0–36.0)
MCV: 94.5 fL (ref 80.0–100.0)
Monocytes Absolute: 0.3 10*3/uL (ref 0.1–1.0)
Monocytes Relative: 7 %
Neutro Abs: 4.3 10*3/uL (ref 1.7–7.7)
Neutrophils Relative %: 83 %
Platelets: 125 10*3/uL — ABNORMAL LOW (ref 150–400)
RBC: 3.1 MIL/uL — ABNORMAL LOW (ref 4.22–5.81)
RDW: 14.4 % (ref 11.5–15.5)
WBC: 5.2 10*3/uL (ref 4.0–10.5)
nRBC: 0 % (ref 0.0–0.2)

## 2020-04-14 LAB — BASIC METABOLIC PANEL
Anion gap: 7 (ref 5–15)
BUN: 13 mg/dL (ref 8–23)
CO2: 21 mmol/L — ABNORMAL LOW (ref 22–32)
Calcium: 7.8 mg/dL — ABNORMAL LOW (ref 8.9–10.3)
Chloride: 105 mmol/L (ref 98–111)
Creatinine, Ser: 1.05 mg/dL (ref 0.61–1.24)
GFR calc Af Amer: >60 mL/min (ref >60)
GFR calc non Af Amer: >60 mL/min (ref >60)
Glucose, Bld: 207 mg/dL — ABNORMAL HIGH (ref 70–99)
Potassium: 4.2 mmol/L (ref 3.5–5.1)
Sodium: 133 mmol/L — ABNORMAL LOW (ref 135–145)

## 2020-04-14 LAB — PHOSPHORUS: Phosphorus: 3.9 mg/dL (ref 2.5–4.6)

## 2020-04-14 LAB — MAGNESIUM: Magnesium: 1.7 mg/dL (ref 1.7–2.4)

## 2020-04-14 MED ORDER — SODIUM CHLORIDE 0.9% FLUSH: 3.0000 mL | INTRAVENOUS | Status: DC | PRN

## 2020-04-14 MED ORDER — ALBUMIN HUMAN 5 % IV SOLN
12.5000 g | Freq: Four times a day (QID) | INTRAVENOUS | Status: AC | PRN
  Filled 2020-04-14: qty 250

## 2020-04-14 MED ORDER — SODIUM CHLORIDE 0.9% FLUSH
3.0000 mL | Freq: Two times a day (BID) | INTRAVENOUS | Status: DC
  Administered 2020-04-14 – 2020-04-16 (×6): 3 mL via INTRAVENOUS

## 2020-04-14 MED ORDER — ENALAPRILAT 1.25 MG/ML IV SOLN
0.6250 mg | Freq: Four times a day (QID) | INTRAVENOUS | Status: DC | PRN
  Filled 2020-04-14: qty 1

## 2020-04-14 MED ORDER — SULFACETAMIDE SODIUM-SULFUR 10-5 % EX CREA: TOPICAL_CREAM | Freq: Every day | CUTANEOUS | Status: DC

## 2020-04-14 MED ORDER — GABAPENTIN 300 MG PO CAPS: 300.0000 mg | ORAL_CAPSULE | Freq: Every day | ORAL | Status: DC

## 2020-04-14 MED ORDER — BRIMONIDINE TARTRATE-TIMOLOL 0.2-0.5 % OP SOLN: 1.0000 [drp] | Freq: Two times a day (BID) | OPHTHALMIC | Status: DC

## 2020-04-14 MED ORDER — SODIUM CHLORIDE 0.9 % IV SOLN: 250.0000 mL | INTRAVENOUS | Status: DC | PRN

## 2020-04-14 MED ORDER — LIP MEDEX EX OINT
1.0000 "application " | TOPICAL_OINTMENT | Freq: Two times a day (BID) | CUTANEOUS | Status: DC
  Administered 2020-04-14 – 2020-04-17 (×7): 1 via TOPICAL
  Filled 2020-04-14 (×2): qty 7

## 2020-04-14 MED ORDER — INSULIN ASPART 100 UNIT/ML ~~LOC~~ SOLN: 0.0000 [IU] | Freq: Every day | SUBCUTANEOUS | Status: DC

## 2020-04-14 MED ORDER — PSYLLIUM 95 % PO PACK
1.0000 | PACK | Freq: Every day | ORAL | Status: DC
  Administered 2020-04-14: 1 via ORAL
  Filled 2020-04-14 (×2): qty 1

## 2020-04-14 MED ORDER — NAPHAZOLINE-GLYCERIN 0.012-0.2 % OP SOLN
1.0000 [drp] | Freq: Four times a day (QID) | OPHTHALMIC | Status: DC | PRN
  Filled 2020-04-14: qty 15

## 2020-04-14 MED ORDER — GABAPENTIN 100 MG PO CAPS
100.0000 mg | ORAL_CAPSULE | Freq: Three times a day (TID) | ORAL | Status: DC
  Administered 2020-04-14 (×3): 100 mg via ORAL
  Filled 2020-04-14 (×3): qty 1

## 2020-04-14 MED ORDER — LORAZEPAM 0.5 MG PO TABS: 0.5000 mg | ORAL_TABLET | Freq: Every evening | ORAL | Status: DC | PRN

## 2020-04-14 MED ORDER — INSULIN ASPART 100 UNIT/ML ~~LOC~~ SOLN
0.0000 [IU] | Freq: Three times a day (TID) | SUBCUTANEOUS | Status: DC
  Administered 2020-04-14: 2 [IU] via SUBCUTANEOUS
  Administered 2020-04-14: 3 [IU] via SUBCUTANEOUS
  Administered 2020-04-15 – 2020-04-16 (×2): 2 [IU] via SUBCUTANEOUS

## 2020-04-14 MED ORDER — SIMETHICONE 40 MG/0.6ML PO SUSP
40.0000 mg | Freq: Four times a day (QID) | ORAL | Status: AC
  Administered 2020-04-14 (×3): 40 mg via ORAL
  Filled 2020-04-14 (×4): qty 0.6

## 2020-04-14 MED ORDER — MAGIC MOUTHWASH
15.0000 mL | Freq: Four times a day (QID) | ORAL | Status: DC | PRN
  Filled 2020-04-14: qty 15

## 2020-04-14 MED ORDER — VITAMIN D3 25 MCG (1000 UNIT) PO TABS
2000.0000 [IU] | ORAL_TABLET | Freq: Every day | ORAL | Status: DC
  Administered 2020-04-14 – 2020-04-17 (×4): 2000 [IU] via ORAL
  Filled 2020-04-14 (×4): qty 2

## 2020-04-14 NOTE — Evaluation (Signed)
Physical Therapy Evaluation Patient Details Name: Dylan York MRN: [id number] DOB: 07/05/1926 Today's Date: 04/14/2020   History of Present Illness  Pt is a very pleasant 21yoM with hx TIA, afib (on Eliquis), hypothyroidism - clear with a newly diagnosed adenocarcinoma in the distal transverse colon in close proximity to the splenic flexure. NOw s/p Segmental colectomy.   Clinical Impression   Pt presents with generalized weakness, mild abdominal discomfort, increased time and effort to mobilize, impaired dynamic standing balance vs baseline, and decreased activity tolerance. Pt to benefit from acute PT to address deficits. Pt ambulated hallway distance with use of RW, at baseline pt ambulates without AD or with cane but felt unsteady this day. 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 PT recommending HHPT to address mobility deficits upon d/c. PT to progress mobility as tolerated, and will continue to follow acutely.      Follow Up Recommendations Home health PT    Equipment Recommendations  Rolling walker with 5" wheels    Recommendations for Other Services       Precautions / Restrictions Precautions Precautions: Fall Restrictions Weight Bearing Restrictions: No      Mobility  Bed Mobility Overal bed mobility: Needs Assistance             General bed mobility comments: Up in bathroom with OT upon PT arrival to room  Transfers Overall transfer level: Needs assistance Equipment used: Rolling walker (2 wheeled) Transfers: Sit to/from Stand Sit to Stand: Min guard         General transfer comment: Min guard with OT, pt standing upon PT arrival to room.  Ambulation/Gait Ambulation/Gait assistance: Min guard Gait Distance (Feet): 200 Feet Assistive device: Rolling walker (2 wheeled) Gait Pattern/deviations: Step-through pattern;Decreased stride length;Trunk flexed Gait velocity:  decr   General Gait Details: Min guard for safety, verbal cuing for upright posture, use of RW as pt not used to using. RW utilized because pt was reaching for environment for steadying upon initial 5 ft of gait.  Stairs            Wheelchair Mobility    Modified Rankin (Stroke Patients Only)       Balance Overall balance assessment: Needs assistance;History of Falls Sitting-balance support: No upper extremity supported;Feet supported Sitting balance-Leahy Scale: Good       Standing balance-Leahy Scale: Fair Standing balance comment: able to stand statically without external support, requires RW for dynamic standing                             Pertinent Vitals/Pain Pain Assessment: Faces Pain Score: 2  Faces Pain Scale: Hurts a little bit Pain Location: bloated abdomen Pain Descriptors / Indicators: Discomfort Pain Intervention(s): Limited activity within patient's tolerance;Monitored during session;Repositioned    Home Living Family/patient expects to be discharged to:: Private residence Living Arrangements: Spouse/significant other Available Help at Discharge: Family;Friend(s) Type of Home: House Home Access: Level entry     Home Layout: One level Home Equipment: Cane - single point Additional Comments: Pt drives, but does not do yardwork anymore. He is caregiver for his wife (WC level)    Prior Function Level of Independence: Independent         Comments: uses a cane PRN on uneven outside surfaces. Reports fall 2 years ago, none since then. States he relies on grocery cart for steadying during  grocery shopping.     Hand Dominance   Dominant Hand: Right    Extremity/Trunk Assessment   Upper Extremity Assessment Upper Extremity Assessment: Defer to OT evaluation    Lower Extremity Assessment Lower Extremity Assessment: Generalized weakness    Cervical / Trunk Assessment Cervical / Trunk Assessment: Normal  Communication    Communication: No difficulties;HOH  Cognition Arousal/Alertness: Awake/alert Behavior During Therapy: WFL for tasks assessed/performed Overall Cognitive Status: Within Functional Limits for tasks assessed                                        General Comments      Exercises     Assessment/Plan    PT Assessment Patient needs continued PT services  PT Problem List Decreased strength;Decreased mobility;Decreased activity tolerance;Decreased balance;Decreased knowledge of use of DME;Pain       PT Treatment Interventions DME instruction;Therapeutic activities;Gait training;Therapeutic exercise;Patient/family education;Balance training;Functional mobility training;Neuromuscular re-education    PT Goals (Current goals can be found in the Care Plan section)  Acute Rehab PT Goals Patient Stated Goal: take care of wife again PT Goal Formulation: With patient Time For Goal Achievement: 04/28/20 Potential to Achieve Goals: Good    Frequency Min 3X/week   Barriers to discharge Decreased caregiver support      Co-evaluation               AM-PAC PT "6 Clicks" Mobility  Outcome Measure Help needed turning from your back to your side while in a flat bed without using bedrails?: A Little Help needed moving from lying on your back to sitting on the side of a flat bed without using bedrails?: A Little Help needed moving to and from a bed to a chair (including a wheelchair)?: A Little Help needed standing up from a chair using your arms (e.g., wheelchair or bedside chair)?: A Little Help needed to walk in hospital room?: A Little Help needed climbing 3-5 steps with a railing? : A Little 6 Click Score: 18    End of Session   Activity Tolerance: Patient tolerated treatment well Patient left: in chair;with call bell/phone within reach Nurse Communication: Mobility status PT Visit Diagnosis: Other abnormalities of gait and mobility (R26.89);Muscle weakness  (generalized) (M62.81);Difficulty in walking, not elsewhere classified (R26.2)    Time: 5110-2111 PT Time Calculation (min) (ACUTE ONLY): 15 min   Charges:   PT Evaluation $PT Eval Low Complexity: 1 Low        Akshith Moncus E, PT Acute Rehabilitation Services Pager [phone]  Office [phone]   Erine Phenix D Elonda Husky 04/14/2020, 12:54 PM

## 2020-04-14 NOTE — Evaluation (Signed)
Occupational Therapy Evaluation Patient Details Name: Dylan York MRN: [id number] DOB: 1926/07/18 Today's Date: 04/14/2020    History of Present Illness Pt is a very pleasant 48yoM with hx TIA, afib (on Eliquis), hypothyroidism - clear with a newly diagnosed adenocarcinoma in the distal transverse colon in close proximity to the splenic flexure. NOw s/p Segmental colectomy.    Clinical Impression   PTA Pt independent in ADL and mobility (uses a cane PRN on uneven outdoor surfaces). He drives, and is caretaker for his wife (WC Level). Today "Dylan York" is very pleasant and willing to work with therapy. He was able to perform transfers with min guard (utilized grab bars from toilet). He performed an entire sponge bath while sitting on the toilet, and completed 3 standing grooming activities without LOB. VSS throughout session. At this time recommend [facility] to perform fall and safety risk assessment in home, education for [name] in caregiving for his wife (ergonomics and efficiency), and to maximize safety and independence in ADL and mobility (today he opted to use a RW bc he felt "off balance" OT will continue to follow acutely at this time.     Follow Up Recommendations  Home health OT    Equipment Recommendations  None recommended by OT    Recommendations for Other Services       Precautions / Restrictions Precautions Precautions: Fall Restrictions Weight Bearing Restrictions: No      Mobility Bed Mobility               General bed mobility comments: OOB at beginning and end of session  Transfers Overall transfer level: Needs assistance Equipment used: Rolling walker (2 wheeled) Transfers: Sit to/from Stand Sit to Stand: Min guard         General transfer comment: min guard for safety    Balance Overall balance assessment: Mild deficits observed, not formally tested                                         ADL either performed or  assessed with clinical judgement   ADL Overall ADL's : Needs assistance/impaired Eating/Feeding: Independent   Grooming: Wash/dry hands;Wash/dry face;Oral care;Min guard;Standing Grooming Details (indicate cue type and reason): sink level, no LOB Upper Body Bathing: Set up;Sitting   Lower Body Bathing: Set up;Sitting/lateral leans Lower Body Bathing Details (indicate cue type and reason): demonstrated figure 4 method Upper Body Dressing : Set up;Sitting   Lower Body Dressing: Minimal assistance;Sit to/from stand Lower Body Dressing Details (indicate cue type and reason): assist to get started on RLE for socks Toilet Transfer: Min guard;RW   Toileting- Clothing Manipulation and Hygiene: Min guard;Sit to/from stand Toileting - Clothing Manipulation Details (indicate cue type and reason): able to perform front and back peri care     Functional mobility during ADLs: Min guard;Rolling walker       Vision Patient Visual Report: No change from baseline       Perception     Praxis      Pertinent Vitals/Pain Pain Assessment: Faces Pain Score: 2  Faces Pain Scale: Hurts a little bit Pain Location: bloated abdomen Pain Descriptors / Indicators: Discomfort Pain Intervention(s): Monitored during session;Repositioned     Hand Dominance Right   Extremity/Trunk Assessment Upper Extremity Assessment Upper Extremity Assessment: Overall WFL for tasks assessed   Lower Extremity Assessment Lower Extremity Assessment: Defer to PT evaluation  Communication Communication Communication: No difficulties;HOH   Cognition Arousal/Alertness: Awake/alert Behavior During Therapy: WFL for tasks assessed/performed Overall Cognitive Status: Within Functional Limits for tasks assessed                                     General Comments       Exercises     Shoulder Instructions      Home Living Family/patient expects to be discharged to:: Private  residence Living Arrangements: Spouse/significant other Available Help at Discharge: Family;Friend(s) Type of Home: House Home Access: Level entry     [place]: One level     Bathroom Shower/Tub: Teacher, early years/pre: Handicapped height Bathroom Accessibility: Yes How Accessible: Accessible via walker     Additional Comments: Pt drives, but does not do yardwork anymore. He is caregiver for his wife (WC bound)      Prior Functioning/Environment Level of Independence: Independent        Comments: uses a cane PRN on uneven outside surfaces        OT Problem List: Decreased activity tolerance;Impaired balance (sitting and/or standing);Decreased knowledge of use of DME or AE      OT Treatment/Interventions: Self-care/ADL training;Therapeutic activities;Patient/family education;Balance training;Energy conservation;DME and/or AE instruction    OT Goals(Current goals can be found in the care plan section) Acute Rehab OT Goals Patient Stated Goal: to get better to get home with wife OT Goal Formulation: With patient Time For Goal Achievement: 04/28/20 Potential to Achieve Goals: Good ADL Goals Pt Will Perform Grooming: with modified independence;standing Pt Will Perform Upper Body Dressing: with modified independence;standing Pt Will Perform Lower Body Dressing: with modified independence;with adaptive equipment;sit to/from stand Pt Will Transfer to Toilet: with modified independence;ambulating Pt Will Perform Toileting - Clothing Manipulation and hygiene: with modified independence;sit to/from stand Additional ADL Goal #1: Pt will verbalize 3 energy conservation strategies to use during ADL routine at independent level  OT Frequency: Min 2X/week   Barriers to D/C:    encouraged Pt to keep paid caretaker for wife initially at home to assist in recovery       Co-evaluation              AM-PAC OT "6 Clicks" Daily Activity     Outcome Measure Help  from another person eating meals?: None Help from another person taking care of personal grooming?: A Little Help from another person toileting, which includes using toliet, bedpan, or urinal?: A Little Help from another person bathing (including washing, rinsing, drying)?: A Little Help from another person to put on and taking off regular upper body clothing?: A Little Help from another person to put on and taking off regular lower body clothing?: A Little 6 Click Score: 19   End of Session Equipment Utilized During Treatment: Rolling walker Nurse Communication: Mobility status;Other (comment) (small BM in toilet)  Activity Tolerance: Patient tolerated treatment well Patient left: Other (comment) (walking in hallway with PT)  OT Visit Diagnosis: Other abnormalities of gait and mobility (R26.89);Unsteadiness on feet (R26.81);Muscle weakness (generalized) (M62.81)                Time: 2229-7989 OT Time Calculation (min): 32 min Charges:  OT General Charges $OT Visit: 1 Visit OT Evaluation $OT Eval Moderate Complexity: 1 Mod OT Treatments $Self Care/Home Management : 8-22 mins  Jesse Sans OTR/L Acute Rehabilitation Services Pager: [phone] Office: [phone]  Merri Ray  Cassiel Fernandez 04/14/2020, 12:16 PM

## 2020-04-14 NOTE — Progress Notes (Signed)
SHAHRAM ALEXOPOULOS [id number] 1926/05/03  CARE TEAM:  PCP: Glenda Chroman, MD  Outpatient Care Team: Patient Care Team: Glenda Chroman, MD as PCP - General (Internal Medicine) Troy Sine, MD as PCP - Cardiology (Cardiology) Myrlene Broker, MD as Attending Physician (Urology)  Inpatient Treatment Team: Treatment Team: Attending Provider: Ileana Roup, MD; Registered Nurse: Denton Brick, RN; Consulting Physician: Garner Gavel, MD; Consulting Physician: Georgette Shell, MD; Occupational Therapist: Jaci Carrel, OT; Physical Therapist: Julien Girt, PT; Utilization Review: Beau Fanny, RN; Registered Nurse: Wray Kearns, RN   Problem List:   Principal Problem:   Adenocarcinoma of transverse colon s/p robotic colectomy 04/13/2020 Active Problems:   AF (atrial fibrillation) (HCC)   Hypertensive heart disease   GERD (gastroesophageal reflux disease)   Anticoagulated   PAF (paroxysmal atrial fibrillation) ([place])   Essential hypertension   S/P laparoscopic colectomy   DM (diabetes mellitus) ([place])   Hyperglycemia   1 Day Post-Op  04/13/2020  PRE-OPERATIVE DIAGNOSIS:  Distal transverse colon cancer  POST-OPERATIVE DIAGNOSIS:  Same  PROCEDURE:   1. Robotic assisted segmental colectomy 2. Takedown of splenic flexure 3.  Intraoperative assessment of perfusion 4. Bilateral transversus abdominus plane blocks  SURGEON:  Sharon [place], MD  FINDINGS: Bulky tumor in mid/distal transverse colon approaching splenic flexure. Segmental colectomy carried out after mobilizing splenic flexure including left branch of middle colic vessels as well as left colic; IMA preserved. ICG perfusion demonstrated excellent uptake on both proximal and distal ends of the colon at staple lines. Transverse to proximal sigmoid intracorporeal anastomosis fashioned.    Assessment  Recovering  [facility] Stay = 1 days)  Plan:  ERAS protocol.  Dysphagia  1/full liquid diet.  Advance to soft if less bloated and has better bowel function.  Okay to transfer up to floor.  Physical and occupational therapies.  Case management consultation as well.  Patient takes care of his demented wife.  He is trying to arrange some private nursing help at home, but I think it would be wise to have case management and therapist evaluate him to make sure he has everything he needs to safely transition.  Hopefully does not see skilled nursing facility that we will see.  Follow-up on pathology.  I spent extra time trying to explain the technique of the surgery.  Explained it numerous ways.  I think he finally got it.  I discussed operative findings, updated the patient's status, discussed probable steps to recovery, and gave postoperative recommendations to the patient and nurse.  Recommendations were made.  Questions were answered.  They expressed understanding & appreciation.  Foley catheter removal postop day 2 given advanced age.  Continue Proscar and Flomax for BPH  Hyperglycemia with borderline hemoglobin A1c.  Sliding scale insulin for now.  Most likely related to steroids given for postoperative nausea and vomiting prophylaxis intraoperatively.  Defer to medicine if further work-up or medicine adjustment needed  Hypertension control.  Hypothyroidism.  Continue levothyroxine.  History of atrial fibrillation.  If ileus resolved and hemoglobin stable, most likely can restart anticoagulation postop day 2.  We will see.  Continue subcutaneous Lovenox for now.  Medicine consultation to help follow perioperatively.  Help appreciated.  -VTE prophylaxis- SCDs, etc  -mobilize as tolerated to help recovery  30 minutes spent in review, evaluation, examination, counseling, and coordination of care.  More than 50% of that time was spent in counseling.  04/14/2020    Subjective: (Chief complaint)  Patient with some bloating.  Wonders if Phazyme would  help.  Tolerating liquids.  Nursing in room.  Transitioning to chair.  Passing some flatus.  Had concerns about his bowel prep and other perioperative questions.  Also concerned about transitioning to home given his wife's home care needs  Objective:  Vital signs:  Vitals:   04/14/20 0300 04/14/20 0400 04/14/20 0500 04/14/20 0700  BP: 127/85 139/79  120/72  Pulse: 64 75  75  Resp: 13 14  15   Temp:  97.9 F (36.6 C)    TempSrc:  Oral    SpO2: 100% 99%  94%  Weight:   70 kg   Height:           Intake/Output   Yesterday:  06/11 0701 - 06/12 0700 In: 3709.8 [I.V.:2859.8; IV Piggyback:850] Out: 745 [Urine:695; Blood:50] This shift:  No intake/output data recorded.  Bowel function:  Flatus: YES  BM:  No  Drain: (No drain)   Physical Exam:  General: Pt awake/alert in no acute distress Eyes: PERRL, normal EOM.  Sclera clear.  No icterus Neuro: CN II-XII intact w/o focal sensory/motor deficits. Lymph: No head/neck/groin lymphadenopathy Psych:  No delerium/psychosis/paranoia.  Oriented x 4 HENT: Normocephalic, Mucus membranes moist.  No thrush Neck: Supple, No tracheal deviation.  No obvious thyromegaly Chest: No pain to chest wall compression.  Good respiratory excursion.  No audible wheezing CV:  Pulses intact.  Regular rhythm.  No major extremity edema MS: Normal AROM mjr joints.  No obvious deformity  Abdomen: Soft.  Mildy distended.  Mildly tender at incisions only.  No evidence of peritonitis.  No incarcerated hernias.  Ext:  No deformity.  No mjr edema.  No cyanosis Skin: No petechiae / purpurea.  No major sores.  Warm and dry    Results:   Cultures: Recent Results (from the past 720 hour(s))  SARS CORONAVIRUS 2 (TAT 6-24 HRS) Nasopharyngeal Nasopharyngeal Swab     Status: None   Collection Time: 04/10/20  1:01 PM   Specimen: Nasopharyngeal Swab  Result Value Ref Range Status   SARS Coronavirus 2 NEGATIVE NEGATIVE Final    Comment:  (NOTE) SARS-CoV-2 target nucleic acids are NOT DETECTED. The SARS-CoV-2 RNA is generally detectable in upper and lower respiratory specimens during the acute phase of infection. Negative results do not preclude SARS-CoV-2 infection, do not rule out co-infections with other pathogens, and should not be used as the sole basis for treatment or other patient management decisions. Negative results must be combined with clinical observations, patient history, and epidemiological information. The expected result is Negative. Fact Sheet for Patients: SugarRoll.be Fact Sheet for Healthcare Providers: https://www.woods-mathews.com/ This test is not yet approved or cleared by the [place] FDA and  has been authorized for detection and/or diagnosis of SARS-CoV-2 by FDA under an Emergency Use Authorization (EUA). This EUA will remain  in effect (meaning this test can be used) for the duration of the COVID-19 declaration under Section 56 4(b)(1) of the Act, 21 U.S.C. section 360bbb-3(b)(1), unless the authorization is terminated or revoked sooner. Performed at [facility], [place] [address], [place], [place] [address]   MRSA PCR Screening     Status: None   Collection Time: 04/13/20  6:21 PM   Specimen: Nasal Mucosa; Nasopharyngeal  Result Value Ref Range Status   MRSA by PCR NEGATIVE NEGATIVE Final    Comment:        The GeneXpert MRSA Assay (FDA approved for NASAL specimens only), is one  component of a comprehensive MRSA colonization surveillance program. It is not intended to diagnose MRSA infection nor to guide or monitor treatment for MRSA infections. Performed at [facility], [place] [address] [place], [place] [address]     Labs: Results for orders placed or performed during the hospital encounter of 04/13/20 (from the past 48 hour(s))  MRSA PCR Screening     Status: None   Collection Time: 04/13/20  6:21 PM    Specimen: Nasal Mucosa; Nasopharyngeal  Result Value Ref Range   MRSA by PCR NEGATIVE NEGATIVE    Comment:        The GeneXpert MRSA Assay (FDA approved for NASAL specimens only), is one component of a comprehensive MRSA colonization surveillance program. It is not intended to diagnose MRSA infection nor to guide or monitor treatment for MRSA infections. Performed at [facility], [place] [address], [place], [place] [address]   CBC with Differential/Platelet     Status: Abnormal   Collection Time: 04/14/20  2:11 AM  Result Value Ref Range   WBC 5.2 4.0 - 10.5 K/uL   RBC 3.10 (L) 4.22 - 5.81 MIL/uL   Hemoglobin 9.3 (L) 13.0 - 17.0 g/dL   HCT 29.3 (L) 39 - 52 %   MCV 94.5 80.0 - 100.0 fL   MCH 30.0 26.0 - 34.0 pg   MCHC 31.7 30.0 - 36.0 g/dL   RDW 14.4 11.5 - 15.5 %   Platelets 125 (L) 150 - 400 K/uL   nRBC 0.0 0.0 - 0.2 %   Neutrophils Relative % 83 %   Neutro Abs 4.3 1.7 - 7.7 K/uL   Lymphocytes Relative 10 %   Lymphs Abs 0.5 (L) 0.7 - 4.0 K/uL   Monocytes Relative 7 %   Monocytes Absolute 0.3 0 - 1 K/uL   Eosinophils Relative 0 %   Eosinophils Absolute 0.0 0 - 0 K/uL   Basophils Relative 0 %   Basophils Absolute 0.0 0 - 0 K/uL   Immature Granulocytes 0 %   Abs Immature Granulocytes 0.02 0.00 - 0.07 K/uL    Comment: Performed at [facility], [place] [address] [place], [place] [address]  Basic metabolic panel     Status: Abnormal   Collection Time: 04/14/20  2:11 AM  Result Value Ref Range   Sodium 133 (L) 135 - 145 mmol/L   Potassium 4.2 3.5 - 5.1 mmol/L   Chloride 105 98 - 111 mmol/L   CO2 21 (L) 22 - 32 mmol/L   Glucose, Bld 207 (H) 70 - 99 mg/dL    Comment: Glucose reference range applies only to samples taken after fasting for at least 8 hours.   BUN 13 8 - 23 mg/dL   Creatinine, Ser 1.05 0.61 - 1.24 mg/dL   Calcium 7.8 (L) 8.9 - 10.3 mg/dL   GFR calc non Af Amer >60 >60 mL/min   GFR calc Af Amer >60 >60 mL/min   Anion gap 7  5 - 15    Comment: Performed at [facility], [place] [address], [place], [place] [address]  Magnesium     Status: None   Collection Time: 04/14/20  2:11 AM  Result Value Ref Range   Magnesium 1.7 1.7 - 2.4 mg/dL    Comment: Performed at [facility], [place] [address], [place],  [address]  Phosphorus     Status: None   Collection Time: 04/14/20  2:11 AM  Result Value Ref Range  Phosphorus 3.9 2.5 - 4.6 mg/dL    Comment: Performed at [facility], [place] [address], [place], [place] [address]    Imaging / Studies: No results found.  Medications / Allergies: per chart  Antibiotics: Anti-infectives (From admission, onward)   Start     Dose/Rate Route Frequency Ordered Stop   04/13/20 1400  neomycin (MYCIFRADIN) tablet 1,000 mg  Status:  Discontinued       "And" Linked Group Details   1,000 mg Oral 3 times per day 04/13/20 1009 04/13/20 1013   04/13/20 1400  metroNIDAZOLE (FLAGYL) tablet 1,000 mg  Status:  Discontinued       "And" Linked Group Details   1,000 mg Oral 3 times per day 04/13/20 1009 04/13/20 1013   04/13/20 1015  cefoTEtan (CEFOTAN) 2 g in sodium chloride 0.9 % 100 mL IVPB        2 g 200 mL/hr over 30 Minutes Intravenous On call to O.R. 04/13/20 1009 04/13/20 1651        Note: Portions of this report may have been transcribed using voice recognition software. Every effort was made to ensure accuracy; however, inadvertent computerized transcription errors may be present.   Any transcriptional errors that result from this process are unintentional.    Adin Hector, MD, FACS, MASCRS Gastrointestinal and Minimally Invasive Surgery  [facility] [address], [place], [place] [address] [phone] Fax [phone] Main/Paging  CONTACT INFORMATION: Weekday (9AM-5PM) concerns: Call [facility] main office at [phone] Weeknight (5PM-9AM) or Weekend/Holiday concerns: Check  [email or web address] for General Surgery [facility] coverage (Please, do not use SecureChat as it is not reliable communication to operating surgeons for immediate patient care)      04/14/2020  8:00 AM

## 2020-04-14 NOTE — Progress Notes (Signed)
PROGRESS NOTE    Dylan York  FBP:[id number] DOB: May 10, 1926 DOA: 04/13/2020 PCP: Glenda Chroman, MD    Brief Narrative: Dylan York is an 86 y.o. male with past medical history of proximal A. fib, BPH, hypothyroidism who has been requested for medical consult by general surgery.  He underwent segmental colectomy today by general surgery.  He reported blood red blood per rectum after bowel movement about 2 weeks ago.  He underwent colonoscopy for this reason on 04/10/2020 which demonstrated a polypoid ulcerated nonobstructing mass in the vicinity of the splenic flexure.  He underwent biopsy which came out to be adenocarcinoma.  He underwent staging CT abdomen/pelvis with showed the mass to be in distal transverse colon, no findings of metastatic disease.  He was referred to general surgery and he underwent segmental colectomy today. He has history of paroxysmal A. fib and he is on Eliquis for anticoagulation, takes metoprolol for rate control. Patient seen and examined in the PACU.  Currently he is hemodynamically stable and afib with mild RVR.As per Dr. Dema Severin, his surgery was uneventful. Patient was awake and alert but confused during my evaluation.  He was just waking of from the effect of anesthesia.  He complained of some abdominal soreness but denied any chest pain or shortness of breath, nausea or vomiting.   Assessment & Plan:   Principal Problem:   Adenocarcinoma of transverse colon s/p robotic colectomy 04/13/2020 Active Problems:   AF (atrial fibrillation) (HCC)   Hypertensive heart disease   GERD (gastroesophageal reflux disease)   Anticoagulated   PAF (paroxysmal atrial fibrillation) (HCC)   Essential hypertension   S/P laparoscopic colectomy   DM (diabetes mellitus) ([place])   Hyperglycemia   #1 paroxysmal A. Fib rate controlled on Toprol 50 mg daily.  Restart Eliquis when okay with surgery.  #2 adenocarcinoma of the distal transverse colon status post colectomy further  management per general surgery.  #3 history of hypothyroidism continue Synthroid  #4 BPH on Flomax and Proscar  #5 essential hypertension blood pressure stable 127/75 on Toprol 50 mg daily  #6 mildly elevated hemoglobin A1c 6.4 with some hyperglycemia 108, 207.  Will not start him on any oral agents.  Continue SSI. Trial of diet control and activity at home. CBG (last 3)  Recent Labs    04/14/20 1145  GLUCAP 149*   #7 glaucoma continue Alphagan and timolol eyedrops  Estimated body mass index is 23.46 kg/m as calculated from the following:   Height as of this encounter: 5\' 8"  (1.727 m).   Weight as of this encounter: 70 kg.  DVT prophylaxis: scd code Status: Full code Family Communication none  Subjective: Very pleasant gentleman Had a lot of questions Has not had any bowel movement or flatus early morning when I saw him Objective: Vitals:   04/14/20 0800 04/14/20 0839 04/14/20 0900 04/14/20 1000  BP: (!) 144/97  (!) 144/95 127/75  Pulse: 83  (!) 103 95  Resp: 14  16 13   Temp:  97.7 F (36.5 C)    TempSrc:  Oral    SpO2: 93%  95% 96%  Weight:      Height:        Intake/Output Summary (Last 24 hours) at 04/14/2020 1255 Last data filed at 04/14/2020 1000 Gross per 24 hour  Intake 4266.75 ml  Output 745 ml  Net 3521.75 ml   Filed Weights   04/13/20 1039 04/13/20 1800 04/14/20 0500  Weight: 63.5 kg 64.4 kg 70 kg  Examination:  General exam: Appears calm and comfortable  Respiratory system: Clear to auscultation. Respiratory effort normal. Cardiovascular system: S1 & S2 heard, RRR. No JVD, murmurs, rubs, gallops or clicks. No pedal edema. Gastrointestinal system: Abdomen is distended, soft and nontender. No organomegaly or masses diminished bowel sounds heard. Central nervous system: Alert and oriented. No focal neurological deficits. Extremities: Symmetric 5 x 5 power. Skin: No rashes, lesions or ulcers Psychiatry: Judgement and insight appear normal. Mood  & affect appropriate.     Data Reviewed: I have personally reviewed following labs and imaging studies  CBC: Recent Labs  Lab 04/10/20 1401 04/14/20 0211  WBC 5.4 5.2  NEUTROABS 3.4 4.3  HGB 11.1* 9.3*  HCT 34.8* 29.3*  MCV 95.6 94.5  PLT 156 893*   Basic Metabolic Panel: Recent Labs  Lab 04/10/20 1401 04/14/20 0211  NA 141 133*  K 4.4 4.2  CL 110 105  CO2 24 21*  GLUCOSE 108* 207*  BUN 23 13  CREATININE 1.15 1.05  CALCIUM 8.9 7.8*  MG  --  1.7  PHOS  --  3.9   GFR: Estimated Creatinine Clearance: 44.3 mL/min (by C-G formula based on SCr of 1.05 mg/dL). Liver Function Tests: Recent Labs  Lab 04/10/20 1401  AST 30  ALT 21  ALKPHOS 75  BILITOT 1.6*  PROT 6.7  ALBUMIN 3.8   No results for input(s): LIPASE, AMYLASE in the last 168 hours. No results for input(s): AMMONIA in the last 168 hours. Coagulation Profile: Recent Labs  Lab 04/10/20 1401  INR 1.5*   Cardiac Enzymes: No results for input(s): CKTOTAL, CKMB, CKMBINDEX, TROPONINI in the last 168 hours. BNP (last 3 results) No results for input(s): PROBNP in the last 8760 hours. HbA1C: No results for input(s): HGBA1C in the last 72 hours. CBG: Recent Labs  Lab 04/14/20 1145  GLUCAP 149*   Lipid Profile: No results for input(s): CHOL, HDL, LDLCALC, TRIG, CHOLHDL, LDLDIRECT in the last 72 hours. Thyroid Function Tests: No results for input(s): TSH, T4TOTAL, FREET4, T3FREE, THYROIDAB in the last 72 hours. Anemia Panel: No results for input(s): VITAMINB12, FOLATE, FERRITIN, TIBC, IRON, RETICCTPCT in the last 72 hours. Sepsis Labs: No results for input(s): PROCALCITON, LATICACIDVEN in the last 168 hours.  Recent Results (from the past 240 hour(s))  SARS CORONAVIRUS 2 (TAT 6-24 HRS) Nasopharyngeal Nasopharyngeal Swab     Status: None   Collection Time: 04/10/20  1:01 PM   Specimen: Nasopharyngeal Swab  Result Value Ref Range Status   SARS Coronavirus 2 NEGATIVE NEGATIVE Final    Comment:  (NOTE) SARS-CoV-2 target nucleic acids are NOT DETECTED. The SARS-CoV-2 RNA is generally detectable in upper and lower respiratory specimens during the acute phase of infection. Negative results do not preclude SARS-CoV-2 infection, do not rule out co-infections with other pathogens, and should not be used as the sole basis for treatment or other patient management decisions. Negative results must be combined with clinical observations, patient history, and epidemiological information. The expected result is Negative. Fact Sheet for Patients: SugarRoll.be Fact Sheet for Healthcare Providers: https://www.woods-Edwardo Wojnarowski.com/ This test is not yet approved or cleared by the [place] FDA and  has been authorized for detection and/or diagnosis of SARS-CoV-2 by FDA under an Emergency Use Authorization (EUA). This EUA will remain  in effect (meaning this test can be used) for the duration of the COVID-19 declaration under Section 56 4(b)(1) of the Act, 21 U.S.C. section 360bbb-3(b)(1), unless the authorization is terminated or revoked sooner. Performed at  [facility], [place] [address], [place], [place] [address]   MRSA PCR Screening     Status: None   Collection Time: 04/13/20  6:21 PM   Specimen: Nasal Mucosa; Nasopharyngeal  Result Value Ref Range Status   MRSA by PCR NEGATIVE NEGATIVE Final    Comment:        The GeneXpert MRSA Assay (FDA approved for NASAL specimens only), is one component of a comprehensive MRSA colonization surveillance program. It is not intended to diagnose MRSA infection nor to guide or monitor treatment for MRSA infections. Performed at [facility], [place] [address] [place], [place] [address]          Radiology Studies: No results found.      Scheduled Meds: . acetaminophen  1,000 mg Oral Q6H  . alvimopan  12 mg Oral BID  . timolol  1 drop Both Eyes BID   And  .  brimonidine  1 drop Both Eyes BID  . Chlorhexidine Gluconate Cloth  6 each Topical Daily  . feeding supplement  237 mL Oral BID BM  . finasteride  5 mg Oral Daily  . gabapentin  100 mg Oral TID  . heparin injection (subcutaneous)  5,000 Units Subcutaneous Q8H  . insulin aspart  0-15 Units Subcutaneous TID WC  . insulin aspart  0-5 Units Subcutaneous QHS  . levothyroxine  75 mcg Oral QAC breakfast  . lip balm  1 application Topical BID  . mouth rinse  15 mL Mouth Rinse BID  . metoprolol succinate  50 mg Oral BID  . psyllium  1 packet Oral Daily  . simethicone  40 mg Oral QID  . sodium chloride flush  3 mL Intravenous Q12H  . tamsulosin  0.4 mg Oral QHS   Continuous Infusions: . sodium chloride    . albumin human       LOS: 1 day     Georgette Shell, MD 04/14/2020, 12:55 PM

## 2020-04-15 DIAGNOSIS — D509 Iron deficiency anemia, unspecified: Secondary | ICD-10-CM

## 2020-04-15 LAB — CBC
HCT: 27.5 % — ABNORMAL LOW (ref 39.0–52.0)
Hemoglobin: 8.9 g/dL — ABNORMAL LOW (ref 13.0–17.0)
MCH: 30.7 pg (ref 26.0–34.0)
MCHC: 32.4 g/dL (ref 30.0–36.0)
MCV: 94.8 fL (ref 80.0–100.0)
Platelets: 123 10*3/uL — ABNORMAL LOW (ref 150–400)
RBC: 2.9 MIL/uL — ABNORMAL LOW (ref 4.22–5.81)
RDW: 14.6 % (ref 11.5–15.5)
WBC: 7.3 10*3/uL (ref 4.0–10.5)
nRBC: 0 % (ref 0.0–0.2)

## 2020-04-15 LAB — GLUCOSE, CAPILLARY
Glucose-Capillary: 78 mg/dL (ref 70–99)
Glucose-Capillary: 122 mg/dL — ABNORMAL HIGH (ref 70–99)
Glucose-Capillary: 123 mg/dL — ABNORMAL HIGH (ref 70–99)
Glucose-Capillary: 127 mg/dL — ABNORMAL HIGH (ref 70–99)

## 2020-04-15 LAB — PHOSPHORUS: Phosphorus: 2.2 mg/dL — ABNORMAL LOW (ref 2.5–4.6)

## 2020-04-15 LAB — BASIC METABOLIC PANEL
Anion gap: 6 (ref 5–15)
BUN: 20 mg/dL (ref 8–23)
CO2: 23 mmol/L (ref 22–32)
Calcium: 7.9 mg/dL — ABNORMAL LOW (ref 8.9–10.3)
Chloride: 107 mmol/L (ref 98–111)
Creatinine, Ser: 1.1 mg/dL (ref 0.61–1.24)
GFR calc Af Amer: >60 mL/min (ref >60)
GFR calc non Af Amer: 58 mL/min — ABNORMAL LOW (ref >60)
Glucose, Bld: 108 mg/dL — ABNORMAL HIGH (ref 70–99)
Potassium: 4 mmol/L (ref 3.5–5.1)
Sodium: 136 mmol/L (ref 135–145)

## 2020-04-15 LAB — MAGNESIUM: Magnesium: 1.9 mg/dL (ref 1.7–2.4)

## 2020-04-15 MED ORDER — APIXABAN 5 MG PO TABS
5.0000 mg | ORAL_TABLET | Freq: Two times a day (BID) | ORAL | Status: DC
  Administered 2020-04-15 – 2020-04-17 (×5): 5 mg via ORAL
  Filled 2020-04-15 (×5): qty 1

## 2020-04-15 MED ORDER — SODIUM CHLORIDE 0.9 % IV SOLN
25.0000 mg | Freq: Once | INTRAVENOUS | Status: AC
  Administered 2020-04-15: 25 mg via INTRAVENOUS
  Filled 2020-04-15: qty 0.5

## 2020-04-15 MED ORDER — SODIUM CHLORIDE 0.9 % IV SOLN
500.0000 mg | Freq: Once | INTRAVENOUS | Status: AC
  Filled 2020-04-15: qty 10

## 2020-04-15 MED ORDER — PSYLLIUM 95 % PO PACK
1.0000 | PACK | Freq: Two times a day (BID) | ORAL | Status: DC
  Administered 2020-04-15 – 2020-04-16 (×3): 1 via ORAL
  Filled 2020-04-15 (×5): qty 1

## 2020-04-15 MED ORDER — GABAPENTIN 100 MG PO CAPS
200.0000 mg | ORAL_CAPSULE | Freq: Every day | ORAL | Status: DC
  Administered 2020-04-15 – 2020-04-16 (×2): 200 mg via ORAL
  Filled 2020-04-15 (×2): qty 2

## 2020-04-15 NOTE — Progress Notes (Signed)
Dylan York [id number] [birth year]  CARE TEAM:  PCP: Glenda Chroman, MD  Outpatient Care Team: Patient Care Team: Glenda Chroman, MD as PCP - General (Internal Medicine) Troy Sine, MD as PCP - Cardiology (Cardiology) Myrlene Broker, MD as Attending Physician (Urology)  Inpatient Treatment Team: Treatment Team: Attending Provider: Ileana Roup, MD; Consulting Physician: Garner Gavel, MD; Consulting Physician: Georgette Shell, MD; Registered Nurse: Denton Brick, RN   Problem List:   Principal Problem:   Adenocarcinoma of transverse colon s/p robotic colectomy 04/13/2020 Active Problems:   AF (atrial fibrillation) (HCC)   Hypertensive heart disease   GERD (gastroesophageal reflux disease)   Anticoagulated   PAF (paroxysmal atrial fibrillation) ([place])   Essential hypertension   S/P laparoscopic colectomy   DM (diabetes mellitus) ([place])   Hyperglycemia   2 Days Post-Op  04/13/2020  PRE-OPERATIVE DIAGNOSIS:  Distal transverse colon cancer  POST-OPERATIVE DIAGNOSIS:  Same  PROCEDURE:   1. Robotic assisted segmental colectomy 2. Takedown of splenic flexure 3.  Intraoperative assessment of perfusion 4. Bilateral transversus abdominus plane blocks  SURGEON:  Sharon [place]. White, MD  FINDINGS: Bulky tumor in mid/distal transverse colon approaching splenic flexure. Segmental colectomy carried out after mobilizing splenic flexure including left branch of middle colic vessels as well as left colic; IMA preserved. ICG perfusion demonstrated excellent uptake on both proximal and distal ends of the colon at staple lines. Transverse to proximal sigmoid intracorporeal anastomosis fashioned.    Assessment  Recovering  [facility] Stay = 2 days)  Plan:  ERAS protocol.  Soft diet.  Advance to Heart healthy as tolerated.  Okay to transfer up to floor.  Still awaiting bed  Physical and occupational therapies.  Case management consultation  as well.  Patient takes care of his demented wife.  He is trying to arrange some private nursing help at home, but I think it would be wise to have case management and therapist evaluate him to make sure he has everything he needs to safely transition.  Hopefully does not see skilled nursing facility that we will see.  Follow-up on pathology.   Foley catheter removed postop day 2 given advanced age.  Continue Proscar and Flomax for BPH  Hyperglycemia with borderline hemoglobin A1c.  Sliding scale insulin for now.  Most likely related to steroids given for postoperative nausea and vomiting prophylaxis intraoperatively.  Defer to medicine if further work-up or medicine adjustment needed  Hypertension control.  Hypothyroidism.  Continue levothyroxine.  History of atrial fibrillation.  Ileus resolved and hemoglobin stable = can restart PO anticoagulation today = postop day 2.  We will see.  Continue subcutaneous heparin for now until on po x 48nrs  Medicine consultation to help follow perioperatively.  Help appreciated.  -VTE prophylaxis- SCDs, etc  -mobilize as tolerated to help recovery  30 minutes spent in review, evaluation, examination, counseling, and coordination of care.  More than 50% of that time was spent in counseling.  04/15/2020    Subjective: (Chief complaint)  No major events.  No floor bed yet so still in stepdown unit.  Tolerating full liquid diet.  Notes some pulling in his lower abdomen but no major pain.  No nausea or vomiting.  Objective:  Vital signs:  Vitals:   04/15/20 0300 04/15/20 0400 04/15/20 0500 04/15/20 0600  BP:      Pulse: (!) 44 (!) 53 (!) 117 94  Resp: 10 (!) 33 15 (!) 23  Temp:  TempSrc:      SpO2: 90% 95% 93% 94%  Weight:   71.1 kg   Height:        Last BM Date: 04/13/20  Intake/Output   Yesterday:  06/12 0701 - 06/13 0700 In: 1137 [P.O.:720; I.V.:417] Out: 1050 [Urine:1050] This shift:  No intake/output data  recorded.  Bowel function:  Flatus: YES  BM:  YES  Drain: (No drain)   Physical Exam:  General: Pt awake/alert in no acute distress Eyes: PERRL, normal EOM.  Sclera clear.  No icterus Neuro: CN II-XII intact w/o focal sensory/motor deficits. Lymph: No head/neck/groin lymphadenopathy Psych:  No delerium/psychosis/paranoia.  Oriented x 4 HENT: Normocephalic, Mucus membranes moist.  No thrush Neck: Supple, No tracheal deviation.  No obvious thyromegaly Chest: No pain to chest wall compression.  Good respiratory excursion.  No audible wheezing CV:  Pulses intact.  Regular rhythm.  No major extremity edema MS: Normal AROM mjr joints.  No obvious deformity  Abdomen: Soft.  Nondistended.  Mildly tender at incisions only.  Dressings clean and dry.  No blistering or stretching or leakage.   no evidence of peritonitis.  No incarcerated hernias.  Ext:  No deformity.  No mjr edema.  No cyanosis Skin: No petechiae / purpurea.  No major sores.  Warm and dry    Results:   Cultures: Recent Results (from the past 720 hour(s))  SARS CORONAVIRUS 2 (TAT 6-24 HRS) Nasopharyngeal Nasopharyngeal Swab     Status: None   Collection Time: 04/10/20  1:01 PM   Specimen: Nasopharyngeal Swab  Result Value Ref Range Status   SARS Coronavirus 2 NEGATIVE NEGATIVE Final    Comment: (NOTE) SARS-CoV-2 target nucleic acids are NOT DETECTED. The SARS-CoV-2 RNA is generally detectable in upper and lower respiratory specimens during the acute phase of infection. Negative results do not preclude SARS-CoV-2 infection, do not rule out co-infections with other pathogens, and should not be used as the sole basis for treatment or other patient management decisions. Negative results must be combined with clinical observations, patient history, and epidemiological information. The expected result is Negative. Fact Sheet for Patients: SugarRoll.be Fact Sheet for Healthcare  Providers: https://www.woods-mathews.com/ This test is not yet approved or cleared by the [place] FDA and  has been authorized for detection and/or diagnosis of SARS-CoV-2 by FDA under an Emergency Use Authorization (EUA). This EUA will remain  in effect (meaning this test can be used) for the duration of the COVID-19 declaration under Section 56 4(b)(1) of the Act, 21 U.S.C. section 360bbb-3(b)(1), unless the authorization is terminated or revoked sooner. Performed at [facility], [place] [address], [place], [place] [address]   MRSA PCR Screening     Status: None   Collection Time: 04/13/20  6:21 PM   Specimen: Nasal Mucosa; Nasopharyngeal  Result Value Ref Range Status   MRSA by PCR NEGATIVE NEGATIVE Final    Comment:        The GeneXpert MRSA Assay (FDA approved for NASAL specimens only), is one component of a comprehensive MRSA colonization surveillance program. It is not intended to diagnose MRSA infection nor to guide or monitor treatment for MRSA infections. Performed at [facility], [place] [address] [place], [place] [address]     Labs: Results for orders placed or performed during the hospital encounter of 04/13/20 (from the past 48 hour(s))  MRSA PCR Screening     Status: None   Collection Time: 04/13/20  6:21 PM   Specimen: Nasal  Mucosa; Nasopharyngeal  Result Value Ref Range   MRSA by PCR NEGATIVE NEGATIVE    Comment:        The GeneXpert MRSA Assay (FDA approved for NASAL specimens only), is one component of a comprehensive MRSA colonization surveillance program. It is not intended to diagnose MRSA infection nor to guide or monitor treatment for MRSA infections. Performed at [facility], [place] [address] [place], [place] [address]   CBC with Differential/Platelet     Status: Abnormal   Collection Time: 04/14/20  2:11 AM  Result Value Ref Range   WBC 5.2 4.0 - 10.5 K/uL   RBC 3.10 (L) 4.22 -  5.81 MIL/uL   Hemoglobin 9.3 (L) 13.0 - 17.0 g/dL   HCT 29.3 (L) 39 - 52 %   MCV 94.5 80.0 - 100.0 fL   MCH 30.0 26.0 - 34.0 pg   MCHC 31.7 30.0 - 36.0 g/dL   RDW 14.4 11.5 - 15.5 %   Platelets 125 (L) 150 - 400 K/uL   nRBC 0.0 0.0 - 0.2 %   Neutrophils Relative % 83 %   Neutro Abs 4.3 1.7 - 7.7 K/uL   Lymphocytes Relative 10 %   Lymphs Abs 0.5 (L) 0.7 - 4.0 K/uL   Monocytes Relative 7 %   Monocytes Absolute 0.3 0 - 1 K/uL   Eosinophils Relative 0 %   Eosinophils Absolute 0.0 0 - 0 K/uL   Basophils Relative 0 %   Basophils Absolute 0.0 0 - 0 K/uL   Immature Granulocytes 0 %   Abs Immature Granulocytes 0.02 0.00 - 0.07 K/uL    Comment: Performed at [facility], [place] [address], [place], [place] [address]  Basic metabolic panel     Status: Abnormal   Collection Time: 04/14/20  2:11 AM  Result Value Ref Range   Sodium 133 (L) 135 - 145 mmol/L   Potassium 4.2 3.5 - 5.1 mmol/L   Chloride 105 98 - 111 mmol/L   CO2 21 (L) 22 - 32 mmol/L   Glucose, Bld 207 (H) 70 - 99 mg/dL    Comment: Glucose reference range applies only to samples taken after fasting for at least 8 hours.   BUN 13 8 - 23 mg/dL   Creatinine, Ser 1.05 0.61 - 1.24 mg/dL   Calcium 7.8 (L) 8.9 - 10.3 mg/dL   GFR calc non Af Amer >60 >60 mL/min   GFR calc Af Amer >60 >60 mL/min   Anion gap 7 5 - 15    Comment: Performed at [facility], [place] [address] [place], [place] [address]  Magnesium     Status: None   Collection Time: 04/14/20  2:11 AM  Result Value Ref Range   Magnesium 1.7 1.7 - 2.4 mg/dL    Comment: Performed at [facility], [place] [address], [place], [place] [address]  Phosphorus     Status: None   Collection Time: 04/14/20  2:11 AM  Result Value Ref Range   Phosphorus 3.9 2.5 - 4.6 mg/dL    Comment: Performed at [facility], [place] [address], [place], [place] [address]  Glucose, capillary     Status: Abnormal   Collection Time:  04/14/20 11:45 AM  Result Value Ref Range   Glucose-Capillary 149 (H) 70 - 99 mg/dL    Comment: Glucose reference range applies only to samples taken after fasting for at least 8 hours.   Comment 1 Notify RN  Comment 2 Document in Chart   Glucose, capillary     Status: Abnormal   Collection Time: 04/14/20  4:50 PM  Result Value Ref Range   Glucose-Capillary 154 (H) 70 - 99 mg/dL    Comment: Glucose reference range applies only to samples taken after fasting for at least 8 hours.   Comment 1 Notify RN    Comment 2 Document in Chart   Glucose, capillary     Status: Abnormal   Collection Time: 04/14/20  9:35 PM  Result Value Ref Range   Glucose-Capillary 120 (H) 70 - 99 mg/dL    Comment: Glucose reference range applies only to samples taken after fasting for at least 8 hours.  Basic metabolic panel     Status: Abnormal   Collection Time: 04/15/20  2:49 AM  Result Value Ref Range   Sodium 136 135 - 145 mmol/L   Potassium 4.0 3.5 - 5.1 mmol/L   Chloride 107 98 - 111 mmol/L   CO2 23 22 - 32 mmol/L   Glucose, Bld 108 (H) 70 - 99 mg/dL    Comment: Glucose reference range applies only to samples taken after fasting for at least 8 hours.   BUN 20 8 - 23 mg/dL   Creatinine, Ser 1.10 0.61 - 1.24 mg/dL   Calcium 7.9 (L) 8.9 - 10.3 mg/dL   GFR calc non Af Amer 58 (L) >60 mL/min   GFR calc Af Amer >60 >60 mL/min   Anion gap 6 5 - 15    Comment: Performed at [facility], [place] [address], [place], [place] [address]  CBC     Status: Abnormal   Collection Time: 04/15/20  2:49 AM  Result Value Ref Range   WBC 7.3 4.0 - 10.5 K/uL   RBC 2.90 (L) 4.22 - 5.81 MIL/uL   Hemoglobin 8.9 (L) 13.0 - 17.0 g/dL   HCT 27.5 (L) 39 - 52 %   MCV 94.8 80.0 - 100.0 fL   MCH 30.7 26.0 - 34.0 pg   MCHC 32.4 30.0 - 36.0 g/dL   RDW 14.6 11.5 - 15.5 %   Platelets 123 (L) 150 - 400 K/uL   nRBC 0.0 0.0 - 0.2 %    Comment: Performed at [facility], [place] [address],  [place], [place] [address]  Magnesium     Status: None   Collection Time: 04/15/20  2:49 AM  Result Value Ref Range   Magnesium 1.9 1.7 - 2.4 mg/dL    Comment: Performed at [facility], [place] [address] [place], [place] [address]  Phosphorus     Status: Abnormal   Collection Time: 04/15/20  2:49 AM  Result Value Ref Range   Phosphorus 2.2 (L) 2.5 - 4.6 mg/dL    Comment: Performed at [facility], [place] [address], [place], [place] [address]  Glucose, capillary     Status: None   Collection Time: 04/15/20  7:50 AM  Result Value Ref Range   Glucose-Capillary 78 70 - 99 mg/dL    Comment: Glucose reference range applies only to samples taken after fasting for at least 8 hours.   Comment 1 Notify RN    Comment 2 Document in Chart     Imaging / Studies: No results found.  Medications / Allergies: per chart  Antibiotics: Anti-infectives (From admission, onward)   Start     Dose/Rate Route Frequency Ordered Stop   04/13/20 1400  neomycin (MYCIFRADIN) tablet 1,000 mg  Status:  Discontinued       "And" Linked Group Details   1,000 mg Oral 3 times per day 04/13/20 1009 04/13/20 1013   04/13/20 1400  metroNIDAZOLE (FLAGYL) tablet 1,000 mg  Status:  Discontinued       "And" Linked Group Details   1,000 mg Oral 3 times per day 04/13/20 1009 04/13/20 1013   04/13/20 1015  cefoTEtan (CEFOTAN) 2 g in sodium chloride 0.9 % 100 mL IVPB        2 g 200 mL/hr over 30 Minutes Intravenous On call to O.R. 04/13/20 1009 04/13/20 1651        Note: Portions of this report may have been transcribed using voice recognition software. Every effort was made to ensure accuracy; however, inadvertent computerized transcription errors may be present.   Any transcriptional errors that result from this process are unintentional.    Adin Hector, MD, FACS, MASCRS Gastrointestinal and Minimally Invasive Surgery  [facility] [address], [place], [place] [address] [phone] Fax [phone] Main/Paging  CONTACT INFORMATION: Weekday (9AM-5PM) concerns: Call [facility] main office at [phone] Weeknight (5PM-9AM) or Weekend/Holiday concerns: Check [email or web address] for General Surgery [facility] coverage (Please, do not use SecureChat as it is not reliable communication to operating surgeons for immediate patient care)      04/15/2020  8:21 AM

## 2020-04-15 NOTE — Progress Notes (Addendum)
PROGRESS NOTE    Dylan York  URK:[id number] DOB: 1926/04/04 DOA: 04/13/2020 PCP: Glenda Chroman, MD    Brief Narrative:Dylan York an 86 y.o.malewith past medical history of proximal A. fib, BPH, hypothyroidism who has been requested for medical consult by general surgery. He underwent segmental colectomy today by general surgery. He reported blood red blood per rectum after bowel movement about 2 weeks ago. He underwent colonoscopy for this reason on 04/10/2020 which demonstrated a polypoid ulcerated nonobstructing mass in the vicinity of the splenic flexure. Heunderwent biopsy which came out to be adenocarcinoma. He underwent staging CT abdomen/pelvis with showed the mass to be in distal transverse colon, no findings of metastatic disease. He was referred to general surgery and he underwent segmental colectomy today. He has history of paroxysmal A. fib and he is on Eliquis for anticoagulation, takes metoprolol for rate control. Patient seen and examined in the PACU. Currently he is hemodynamically stable and afib with mild RVR.As per Dr. Jaquita Rector surgery was uneventful. Patient was awake and alert but confused during my evaluation. He was just waking of from the effect of anesthesia. He complained of some abdominal soreness but denied any chest pain or shortness of breath, nausea or vomiting.  Assessment & Plan:   Principal Problem:   Adenocarcinoma of transverse colon s/p robotic colectomy 04/13/2020 Active Problems:   AF (atrial fibrillation) (HCC)   Hypertensive heart disease   GERD (gastroesophageal reflux disease)   Anticoagulated   PAF (paroxysmal atrial fibrillation) (HCC)   Essential hypertension   S/P laparoscopic colectomy   DM (diabetes mellitus) ([place])   Hyperglycemia   IDA (iron deficiency anemia)   #1 paroxysmal A. Fib rate controlled on Toprol 50 mg daily.  Restart Eliquis today per general surgery.   #2 adenocarcinoma of the distal transverse  colon status post colectomy further management per general surgery.  #3 history of hypothyroidism continue Synthroid  #4 BPH on Flomax and Proscar  #5 essential hypertension -blood pressure 135/91.  Continue Toprol.    #6 mildly elevated hemoglobin A1c 6.4 with some hyperglycemia 108, 207.  Will not start him on any oral agents.  Continue SSI. Trial of diet control and activity at home.  WILL SIGN OFF.UPON DC CONTINUE TOPROL AND ELIQUIS.    Estimated body mass index is 23.83 kg/m as calculated from the following:   Height as of this encounter: 5\' 8"  (1.727 m).   Weight as of this encounter: 71.1 kg.  DVT prophylaxis: scd code Status: Full code Family Communication none  Subjective: Patient up in bed in nad  No acute events overnight  Objective: Vitals:   04/15/20 0300 04/15/20 0400 04/15/20 0500 04/15/20 0600  BP:      Pulse: (!) 44 (!) 53 (!) 117 94  Resp: 10 (!) 33 15 (!) 23  Temp:      TempSrc:      SpO2: 90% 95% 93% 94%  Weight:   71.1 kg   Height:        Intake/Output Summary (Last 24 hours) at 04/15/2020 1115 Last data filed at 04/15/2020 0600 Gross per 24 hour  Intake 480 ml  Output 1050 ml  Net -570 ml   Filed Weights   04/13/20 1800 04/14/20 0500 04/15/20 0500  Weight: 64.4 kg 70 kg 71.1 kg    Examination:  General exam: Appears calm and comfortable  Respiratory system: Clear to auscultation. Respiratory effort normal. Cardiovascular system: S1 & S2 heard, irregularly irregular. No JVD, murmurs, rubs, gallops  or clicks. No pedal edema. Gastrointestinal system: Abdomen is distended, soft and nontender. No organomegaly or masses felt. Normal bowel sounds heard. Central nervous system: Alert and oriented. No focal neurological deficits. Extremities: Symmetric 5 x 5 power. Skin: No rashes, lesions or ulcers Psychiatry: Judgement and insight appear normal. Mood & affect appropriate.     Data Reviewed: I have personally reviewed following labs  and imaging studies  CBC: Recent Labs  Lab 04/10/20 1401 04/14/20 0211 04/15/20 0249  WBC 5.4 5.2 7.3  NEUTROABS 3.4 4.3  --   HGB 11.1* 9.3* 8.9*  HCT 34.8* 29.3* 27.5*  MCV 95.6 94.5 94.8  PLT 156 125* 454*   Basic Metabolic Panel: Recent Labs  Lab 04/10/20 1401 04/14/20 0211 04/15/20 0249  NA 141 133* 136  K 4.4 4.2 4.0  CL 110 105 107  CO2 24 21* 23  GLUCOSE 108* 207* 108*  BUN 23 13 20   CREATININE 1.15 1.05 1.10  CALCIUM 8.9 7.8* 7.9*  MG  --  1.7 1.9  PHOS  --  3.9 2.2*   GFR: Estimated Creatinine Clearance: 42.3 mL/min (by C-G formula based on SCr of 1.1 mg/dL). Liver Function Tests: Recent Labs  Lab 04/10/20 1401  AST 30  ALT 21  ALKPHOS 75  BILITOT 1.6*  PROT 6.7  ALBUMIN 3.8   No results for input(s): LIPASE, AMYLASE in the last 168 hours. No results for input(s): AMMONIA in the last 168 hours. Coagulation Profile: Recent Labs  Lab 04/10/20 1401  INR 1.5*   Cardiac Enzymes: No results for input(s): CKTOTAL, CKMB, CKMBINDEX, TROPONINI in the last 168 hours. BNP (last 3 results) No results for input(s): PROBNP in the last 8760 hours. HbA1C: No results for input(s): HGBA1C in the last 72 hours. CBG: Recent Labs  Lab 04/14/20 1145 04/14/20 1650 04/14/20 2135 04/15/20 0750  GLUCAP 149* 154* 120* 78   Lipid Profile: No results for input(s): CHOL, HDL, LDLCALC, TRIG, CHOLHDL, LDLDIRECT in the last 72 hours. Thyroid Function Tests: No results for input(s): TSH, T4TOTAL, FREET4, T3FREE, THYROIDAB in the last 72 hours. Anemia Panel: No results for input(s): VITAMINB12, FOLATE, FERRITIN, TIBC, IRON, RETICCTPCT in the last 72 hours. Sepsis Labs: No results for input(s): PROCALCITON, LATICACIDVEN in the last 168 hours.  Recent Results (from the past 240 hour(s))  SARS CORONAVIRUS 2 (TAT 6-24 HRS) Nasopharyngeal Nasopharyngeal Swab     Status: None   Collection Time: 04/10/20  1:01 PM   Specimen: Nasopharyngeal Swab  Result Value Ref Range  Status   SARS Coronavirus 2 NEGATIVE NEGATIVE Final    Comment: (NOTE) SARS-CoV-2 target nucleic acids are NOT DETECTED. The SARS-CoV-2 RNA is generally detectable in upper and lower respiratory specimens during the acute phase of infection. Negative results do not preclude SARS-CoV-2 infection, do not rule out co-infections with other pathogens, and should not be used as the sole basis for treatment or other patient management decisions. Negative results must be combined with clinical observations, patient history, and epidemiological information. The expected result is Negative. Fact Sheet for Patients: SugarRoll.be Fact Sheet for Healthcare Providers: https://www.woods-Takina Busser.com/ This test is not yet approved or cleared by the [place] FDA and  has been authorized for detection and/or diagnosis of SARS-CoV-2 by FDA under an Emergency Use Authorization (EUA). This EUA will remain  in effect (meaning this test can be used) for the duration of the COVID-19 declaration under Section 56 4(b)(1) of the Act, 21 U.S.C. section 360bbb-3(b)(1), unless the authorization is terminated or revoked  sooner. Performed at [facility], [place] [address] [place], [place] [address]   MRSA PCR Screening     Status: None   Collection Time: 04/13/20  6:21 PM   Specimen: Nasal Mucosa; Nasopharyngeal  Result Value Ref Range Status   MRSA by PCR NEGATIVE NEGATIVE Final    Comment:        The GeneXpert MRSA Assay (FDA approved for NASAL specimens only), is one component of a comprehensive MRSA colonization surveillance program. It is not intended to diagnose MRSA infection nor to guide or monitor treatment for MRSA infections. Performed at [facility], [place] [address], [place], [place] [address]          Radiology Studies: No results found.      Scheduled Meds: . acetaminophen  1,000 mg Oral Q6H  . alvimopan  12  mg Oral BID  . apixaban  5 mg Oral BID  . timolol  1 drop Both Eyes BID   And  . brimonidine  1 drop Both Eyes BID  . Chlorhexidine Gluconate Cloth  6 each Topical Daily  . cholecalciferol  2,000 Units Oral Daily  . feeding supplement  237 mL Oral BID BM  . finasteride  5 mg Oral Daily  . gabapentin  200 mg Oral QHS  . heparin injection (subcutaneous)  5,000 Units Subcutaneous Q8H  . insulin aspart  0-15 Units Subcutaneous TID WC  . insulin aspart  0-5 Units Subcutaneous QHS  . levothyroxine  75 mcg Oral QAC breakfast  . lip balm  1 application Topical BID  . mouth rinse  15 mL Mouth Rinse BID  . metoprolol succinate  50 mg Oral BID  . psyllium  1 packet Oral BID  . sodium chloride flush  3 mL Intravenous Q12H  . Sulfacetamide Sodium-Sulfur   Apply externally Daily  . tamsulosin  0.4 mg Oral QHS   Continuous Infusions: . sodium chloride    . albumin human    . iron dextran (INFED/DEXFERRUM) infusion       LOS: 2 days     Georgette Shell, MD  04/15/2020, 11:15 AM

## 2020-04-16 ENCOUNTER — Encounter (HOSPITAL_COMMUNITY): Payer: Self-pay | Admitting: Surgery

## 2020-04-16 LAB — CBC
HCT: 28.4 % — ABNORMAL LOW (ref 39.0–52.0)
Hemoglobin: 9.1 g/dL — ABNORMAL LOW (ref 13.0–17.0)
MCH: 30.3 pg (ref 26.0–34.0)
MCHC: 32 g/dL (ref 30.0–36.0)
MCV: 94.7 fL (ref 80.0–100.0)
Platelets: 123 10*3/uL — ABNORMAL LOW (ref 150–400)
RBC: 3 MIL/uL — ABNORMAL LOW (ref 4.22–5.81)
RDW: 14.8 % (ref 11.5–15.5)
WBC: 5.7 10*3/uL (ref 4.0–10.5)
nRBC: 0 % (ref 0.0–0.2)

## 2020-04-16 LAB — BASIC METABOLIC PANEL
Anion gap: 7 (ref 5–15)
BUN: 21 mg/dL (ref 8–23)
CO2: 23 mmol/L (ref 22–32)
Calcium: 8.1 mg/dL — ABNORMAL LOW (ref 8.9–10.3)
Chloride: 105 mmol/L (ref 98–111)
Creatinine, Ser: 1.14 mg/dL (ref 0.61–1.24)
GFR calc Af Amer: >60 mL/min (ref >60)
GFR calc non Af Amer: 56 mL/min — ABNORMAL LOW (ref >60)
Glucose, Bld: 101 mg/dL — ABNORMAL HIGH (ref 70–99)
Potassium: 3.4 mmol/L — ABNORMAL LOW (ref 3.5–5.1)
Sodium: 135 mmol/L (ref 135–145)

## 2020-04-16 LAB — MAGNESIUM: Magnesium: 1.8 mg/dL (ref 1.7–2.4)

## 2020-04-16 LAB — GLUCOSE, CAPILLARY
Glucose-Capillary: 94 mg/dL (ref 70–99)
Glucose-Capillary: 101 mg/dL — ABNORMAL HIGH (ref 70–99)
Glucose-Capillary: 133 mg/dL — ABNORMAL HIGH (ref 70–99)
Glucose-Capillary: 111 mg/dL — ABNORMAL HIGH (ref 70–99)

## 2020-04-16 LAB — PHOSPHORUS: Phosphorus: 1.7 mg/dL — ABNORMAL LOW (ref 2.5–4.6)

## 2020-04-16 LAB — SURGICAL PATHOLOGY

## 2020-04-16 NOTE — Progress Notes (Signed)
Physical Therapy Treatment Patient Details Name: Dylan York MRN: [id number] DOB: 19-May-1926 Today's Date: 04/16/2020    History of Present Illness Pt is a very pleasant 64yoM with hx TIA, afib (on Eliquis), hypothyroidism - clear with a newly diagnosed adenocarcinoma in the distal transverse colon in close proximity to the splenic flexure. NOw s/p Segmental colectomy.     PT Comments    Pt OOB in recliner "feeling better".  Plans to D/C to home tomorrow.  Assisted with amb in hallway.  General Gait Details: amb with and without RW.  Without pt was unsteady with tendency to grab furniture/hallway rail.  With walker pt was steady and tolerated an increased distance.  Advised pt to use walker "until you get straonger".  Pt "sometimes" uses a Rollator when he is out and about but WOULD like a regular RW for home use when needed.General transfer comment: 25% VC's on safety with turns and navigating through tight spaces.  Returned to room and performed some standing TE's of leg lifts (hip flex, ABD, ext) as well as small standing squats and heel raises.  Pt very interested on exercises he can do at home to regain his strength.  Gave hin a general exercise handout.  Advised him on activity progression.  Pt also receiving HH PT.    Follow Up Recommendations  Home health PT     Equipment Recommendations  Rolling walker with 5" wheels    Recommendations for Other Services       Precautions / Restrictions Precautions Precautions: Fall Restrictions Weight Bearing Restrictions: No    Mobility  Bed Mobility               General bed mobility comments: OOB in recliner  Transfers Overall transfer level: Needs assistance Equipment used: Rolling walker (2 wheeled) Transfers: Sit to/from Stand Sit to Stand: Min guard;Supervision         General transfer comment: 25% VC's on safety with turns and navigating through tight spaces  Ambulation/Gait Ambulation/Gait assistance: Min  guard;Min assist Gait Distance (Feet): 250 Feet Assistive device: Rolling walker (2 wheeled) Gait Pattern/deviations: Step-through pattern;Decreased stride length;Trunk flexed Gait velocity: decreased   General Gait Details: amb with and without RW.  Without pt was unsteady with tendency to grab furniture/hallway rail.  With walker pt was steady and tolerated an increased distance.  Advised pt to use walker "until you get straonger".  Pt "sometimes" uses a Rollator when he is out and about but WOULD like a regular RW for home use when needed.   Stairs             Wheelchair Mobility    Modified Rankin (Stroke Patients Only)       Balance                                            Cognition Arousal/Alertness: Awake/alert Behavior During Therapy: WFL for tasks assessed/performed Overall Cognitive Status: Within Functional Limits for tasks assessed                                 General Comments: AxO x 4 pleasant      Exercises  5 reps all standing TE's hip flex, ABD, ext as well as short squats and heel raises all holding to walker for sppport.  General Comments        Pertinent Vitals/Pain Pain Assessment: Faces Faces Pain Scale: Hurts a little bit Pain Location: ABD Pain Descriptors / Indicators: Discomfort;Constant Pain Intervention(s): Monitored during session    Home Living                      Prior Function            PT Goals (current goals can now be found in the care plan section) Progress towards PT goals: Progressing toward goals    Frequency    Min 3X/week      PT Plan Current plan remains appropriate    Co-evaluation              AM-PAC PT "6 Clicks" Mobility   Outcome Measure  Help needed turning from your back to your side while in a flat bed without using bedrails?: A Little Help needed moving from lying on your back to sitting on the side of a flat bed without using  bedrails?: A Little Help needed moving to and from a bed to a chair (including a wheelchair)?: A Little Help needed standing up from a chair using your arms (e.g., wheelchair or bedside chair)?: A Little Help needed to walk in hospital room?: A Little Help needed climbing 3-5 steps with a railing? : A Little 6 Click Score: 18    End of Session Equipment Utilized During Treatment: Gait belt Activity Tolerance: Patient tolerated treatment well Patient left: in chair;with call bell/phone within reach Nurse Communication: Mobility status PT Visit Diagnosis: Other abnormalities of gait and mobility (R26.89);Muscle weakness (generalized) (M62.81);Difficulty in walking, not elsewhere classified (R26.2)     Time: 9295-7473 PT Time Calculation (min) (ACUTE ONLY): 26 min  Charges:  $Gait Training: 8-22 mins $Therapeutic Exercise: 8-22 mins                     Rica Koyanagi  PTA Acute  Rehabilitation Services Pager      [phone] Office      [phone]

## 2020-04-16 NOTE — Care Management Important Message (Signed)
Important Message  Patient Details IM Letter given to [place] Case Manager to present to the Patient Name: Dylan York MRN: [id number] Date of Birth: Dec 13, 1925   Medicare Important Message Given:  Yes     Kerin Salen 04/16/2020, 3:43 PM

## 2020-04-16 NOTE — TOC Initial Note (Addendum)
Transition of Care [facility]) - Initial/Assessment Note    Patient Details  Name: Dylan York MRN: [id number] Date of Birth: [birth year]  Transition of Care [facility] CM/SW Contact:    Lennart Pall, LCSW Phone Number: 04/16/2020, 10:24 AM  Clinical Narrative:    Met with pt to review d/c needs.  Pt very pleasant and hopeful to go home today.  He is agreeable with MD orders for HHPT and OT and has no agency preference - referral placed with [facility].   Pt reports that he is the caregiver for his wife, yet notes that she is physically independent "it's just that her memory is not good."  Wife's sister has been staying with her while he has been hospitalized and they have hired private duty caregiver to assist daily while pt recovers.               Expected Discharge Plan: [place] Barriers to Discharge: Continued Medical Work up   Patient Goals and CMS Choice Patient states their goals for this hospitalization and ongoing recovery are:: go home CMS Medicare.gov Compare Post Acute Care list provided to:: Patient Choice offered to / list presented to : Patient  Expected Discharge Plan and Services Expected Discharge Plan: [place] In-house Referral: Clinical Social Work   Post Acute Care Choice: Home Health, Durable Medical Equipment Living arrangements for the past 2 months: [place]                 DME Arranged: Walker rolling DME Agency: [employer] Date DME Agency Contacted: 04/16/20 Time DME Agency Contacted: 67 Representative spoke with at DME Agency: Andree Coss HH Arranged: PT, OT [place] Agency: [place] Date [place]: 04/16/20 Time [place]: [phone] Representative spoke with at [place]: [place]  Prior Living Arrangements/Services Living arrangements for the past 2 months: [place] with:: Spouse Patient language and need for interpreter reviewed:: Yes Do you feel safe  going back to the place where you live?: Yes        Care giver support system in place?: Yes (comment)   Criminal Activity/Legal Involvement Pertinent to Current Situation/Hospitalization: No - Comment as needed  Activities of Daily Living Home Assistive Devices/Equipment: Cane (specify quad or straight), Eyeglasses, Blood pressure cuff ADL Screening (condition at time of admission) Patient's cognitive ability adequate to safely complete daily activities?: Yes Is the patient deaf or have difficulty hearing?: No Does the patient have difficulty seeing, even when wearing glasses/contacts?: No Does the patient have difficulty concentrating, remembering, or making decisions?: No Patient able to express need for assistance with ADLs?: Yes Does the patient have difficulty dressing or bathing?: No Independently performs ADLs?: Yes (appropriate for developmental age) Does the patient have difficulty walking or climbing stairs?: Yes Weakness of Legs: Both Weakness of Arms/Hands: None  Permission Sought/Granted                  Emotional Assessment Appearance:: Appears stated age Attitude/Demeanor/Rapport: Engaged, Gracious   Orientation: : Oriented to Self, Oriented to Place, Oriented to  Time, Oriented to Situation Alcohol / Substance Use: Not Applicable Psych Involvement: No (comment)  Admission diagnosis:  S/P laparoscopic colectomy [Z90.49] Patient Active Problem List   Diagnosis Date Noted  . IDA (iron deficiency anemia) 04/15/2020  . DM (diabetes mellitus) ([place]) 04/14/2020  . Hyperglycemia 04/14/2020  . Adenocarcinoma of transverse colon s/p robotic colectomy 04/13/2020 04/14/2020  . S/P laparoscopic  colectomy 04/13/2020  . Dysphagia 11/08/2019  . Anemia, unspecified 11/08/2019  . Bilateral hand numbness 05/12/2017  . Hyperlipidemia 10/02/2016  . Essential hypertension 10/02/2016  . Chronic diastolic heart failure ([place]) 09/11/2016  . Right homonymous hemianopsia  09/07/2016  . Cerebrovascular accident (CVA) due to embolism of left middle cerebral artery ([place]) 09/07/2016  . Chronic prostatitis 07/02/2015  . Hypothyroidism 09/26/2014  . Lower extremity edema 08/23/2013  . PAF (paroxysmal atrial fibrillation) ([place]) 08/08/2013  . Sinus bradycardia, with HR in the 40s BB stopped. 08/08/2013  . Labile hypertension 08/07/2013  . Hypertensive crisis 08/05/2013  . Anticoagulated 05/05/2013  . AF (atrial fibrillation) ([place]) 04/04/2013  . Hypertensive heart disease 04/04/2013  . Benign prostatic hyperplasia with lower urinary tract symptoms 04/04/2013  . GERD (gastroesophageal reflux disease) 04/04/2013  . Sick sinus syndrome ([place]) 04/04/2013  . Renal cyst 11/03/2011   PCP:  Glenda Chroman, MD Pharmacy:   [place], [place] [address] [place] [address] Phone: [phone] Fax: [phone]     Social Determinants of Health (SDOH) Interventions    Readmission Risk Interventions No flowsheet data found.

## 2020-04-16 NOTE — Progress Notes (Signed)
Occupational Therapy Treatment Patient Details Name: Dylan York MRN: [id number] DOB: September 07, 1926 Today's Date: 04/16/2020    History of present illness Pt is a very pleasant 70yoM with hx TIA, afib (on Eliquis), hypothyroidism - clear with a newly diagnosed adenocarcinoma in the distal transverse colon in close proximity to the splenic flexure. NOw s/p Segmental colectomy.    OT comments  Pt very motivated to get well and care for wife  Follow Up Recommendations  Home health OT    Equipment Recommendations  None recommended by OT    Recommendations for Other Services      Precautions / Restrictions Precautions Precautions: Fall Restrictions Weight Bearing Restrictions: No       Mobility Bed Mobility               General bed mobility comments: sitting EOB  Transfers Overall transfer level: Needs assistance Equipment used: Rolling walker (2 wheeled) Transfers: Sit to/from [facility] Sit to Stand: Min guard Stand pivot transfers: Min assist       General transfer comment: 25% VC's on safety with turns and navigating through tight spaces    Balance Overall balance assessment: Needs assistance;History of Falls Sitting-balance support: No upper extremity supported;Feet supported Sitting balance-Leahy Scale: Good       Standing balance-Leahy Scale: Fair Standing balance comment: able to stand statically without external support, requires RW for dynamic standing                           ADL either performed or assessed with clinical judgement   ADL Overall ADL's : Needs assistance/impaired     Grooming: Standing;Set up   Upper Body Bathing: Set up;Sitting   Lower Body Bathing: Sit to/from stand;Minimal assistance;Cueing for safety;Cueing for sequencing       Lower Body Dressing: Minimal assistance;Sit to/from stand   Toilet Transfer: Min guard;RW   Toileting- Water quality scientist and Hygiene: Minimal assistance;Sit  to/from stand;Cueing for safety;Cueing for compensatory techniques       Functional mobility during ADLs: Minimal assistance General ADL Comments: pt will have A at home per pt.  Will need tub DME but reccomend HH Aing pt with this as tub is an older model     Vision Patient Visual Report: No change from baseline            Cognition Arousal/Alertness: Awake/alert Behavior During Therapy: WFL for tasks assessed/performed Overall Cognitive Status: Within Functional Limits for tasks assessed                                 General Comments: AxO x 4 pleasant                   Pertinent Vitals/ Pain       Pain Assessment: No/denies pain Faces Pain Scale: Hurts a little bit Pain Location: ABD Pain Descriptors / Indicators: Discomfort;Constant Pain Intervention(s): Monitored during session         Frequency  Min 2X/week        Progress Toward Goals  OT Goals(current goals can now be found in the care plan section)  Progress towards OT goals: Progressing toward goals            AM-PAC OT "6 Clicks" Daily Activity     Outcome Measure   Help from another person eating meals?: None Help from another person taking care of  personal grooming?: A Little Help from another person toileting, which includes using toliet, bedpan, or urinal?: A Little Help from another person bathing (including washing, rinsing, drying)?: A Little Help from another person to put on and taking off regular upper body clothing?: A Little Help from another person to put on and taking off regular lower body clothing?: A Little 6 Click Score: 19    End of Session Equipment Utilized During Treatment: Rolling walker  OT Visit Diagnosis: Other abnormalities of gait and mobility (R26.89);Unsteadiness on feet (R26.81);Muscle weakness (generalized) (M62.81)   Activity Tolerance Patient tolerated treatment well   Patient Left Other (comment) (walking in hallway with PT)   Nurse  Communication Mobility status;Other (comment) (small BM in toilet)        Time: 1497-0263 OT Time Calculation (min): 10 min  Charges: OT General Charges $OT Visit: 1 Visit OT Treatments $Self Care/Home Management : 8-22 mins  Kari Baars, Agency [phone] Office- [phone], Edwena Felty D 04/16/2020, 4:15 PM

## 2020-04-16 NOTE — Progress Notes (Addendum)
Subjective No acute events. Feeling quite well - had lots of flatus in last 24hrs and abdominal distention he had experienced Saturday has resolved. Tolerating diet without n/v. Passing gas, having BMs. Denies pain.  Objective: Vital signs in last 24 hours: Temp:  [97.6 F (36.4 C)-97.9 F (36.6 C)] 97.8 F (36.6 C) (06/14 0557) Pulse Rate:  [80-115] 96 (06/14 0557) Resp:  [15-21] 18 (06/14 0557) BP: (101-156)/(65-106) 156/99 (06/14 0557) SpO2:  [94 %-99 %] 98 % (06/14 0557) Last BM Date: 04/15/20  Intake/Output from previous day: 06/13 0701 - 06/14 0700 In: 540 [P.O.:540] Out: 1300 [Urine:1300] Intake/Output this shift: No intake/output data recorded.  Gen: NAD, comfortable CV: RRR Pulm: Normal work of breathing Abd: Soft, NT/ND. Incisions c/d/i without erythema or drainage. Ext: SCDs in place  Lab Results: CBC  Recent Labs    04/15/20 0249 04/16/20 0446  WBC 7.3 5.7  HGB 8.9* 9.1*  HCT 27.5* 28.4*  PLT 123* 123*   BMET Recent Labs    04/15/20 0249 04/16/20 0446  NA 136 135  K 4.0 3.4*  CL 107 105  CO2 23 23  GLUCOSE 108* 101*  BUN 20 21  CREATININE 1.10 1.14  CALCIUM 7.9* 8.1*   PT/INR No results for input(s): LABPROT, INR in the last 72 hours. ABG No results for input(s): PHART, HCO3 in the last 72 hours.  Invalid input(s): PCO2, PO2  Studies/Results:  Anti-infectives: Anti-infectives (From admission, onward)   Start     Dose/Rate Route Frequency Ordered Stop   04/13/20 1400  neomycin (MYCIFRADIN) tablet 1,000 mg  Status:  Discontinued       "And" Linked Group Details   1,000 mg Oral 3 times per day 04/13/20 1009 04/13/20 1013   04/13/20 1400  metroNIDAZOLE (FLAGYL) tablet 1,000 mg  Status:  Discontinued       "And" Linked Group Details   1,000 mg Oral 3 times per day 04/13/20 1009 04/13/20 1013   04/13/20 1015  cefoTEtan (CEFOTAN) 2 g in sodium chloride 0.9 % 100 mL IVPB        2 g 200 mL/hr over 30 Minutes Intravenous On call to O.R.  04/13/20 1009 04/13/20 1651       Assessment/Plan: Patient Active Problem List   Diagnosis Date Noted  . IDA (iron deficiency anemia) 04/15/2020  . DM (diabetes mellitus) ([place]) 04/14/2020  . Hyperglycemia 04/14/2020  . Adenocarcinoma of transverse colon s/p robotic colectomy 04/13/2020 04/14/2020  . S/P laparoscopic colectomy 04/13/2020  . Dysphagia 11/08/2019  . Anemia, unspecified 11/08/2019  . Bilateral hand numbness 05/12/2017  . Hyperlipidemia 10/02/2016  . Essential hypertension 10/02/2016  . Chronic diastolic heart failure ([place]) 09/11/2016  . Right homonymous hemianopsia 09/07/2016  . Cerebrovascular accident (CVA) due to embolism of left middle cerebral artery ([place]) 09/07/2016  . Chronic prostatitis 07/02/2015  . Hypothyroidism 09/26/2014  . Lower extremity edema 08/23/2013  . PAF (paroxysmal atrial fibrillation) ([place]) 08/08/2013  . Sinus bradycardia, with HR in the 40s BB stopped. 08/08/2013  . Labile hypertension 08/07/2013  . Hypertensive crisis 08/05/2013  . Anticoagulated 05/05/2013  . AF (atrial fibrillation) ([place]) 04/04/2013  . Hypertensive heart disease 04/04/2013  . Benign prostatic hyperplasia with lower urinary tract symptoms 04/04/2013  . GERD (gastroesophageal reflux disease) 04/04/2013  . Sick sinus syndrome ([place]) 04/04/2013  . Renal cyst 11/03/2011   s/p Procedure(s): XI ROBOT ASSISTED SEGMENTAL COLECTOMY, TAKEDOWN OF SPLENIC FLEXURE, BILATERAL TAP BLOCK 04/13/2020  -Doing quite well! -Working with therapies - appreciate help - recommending  home PT/OT and this has been requested today with consult to transition of care team. [place] Rile for home -Diet as tolerated -Ambulate with walker 5x/day as able, up in chair -Hgb stable on Eliquis which was resumed yesterday; no blood in stool per patient report -PPx: SCDs (and on therapeutic eliquis) -Dispo: Pending establishment of home health   LOS: 3 days   Sharon Mt. Dema Severin, M.D. [facility] Use [email or web address] to contact on call provider

## 2020-04-17 LAB — GLUCOSE, CAPILLARY: Glucose-Capillary: 146 mg/dL — ABNORMAL HIGH (ref 70–99)

## 2020-04-17 MED ORDER — POTASSIUM CHLORIDE CRYS ER 20 MEQ PO TBCR
40.0000 meq | EXTENDED_RELEASE_TABLET | Freq: Once | ORAL | Status: AC
  Administered 2020-04-17: 40 meq via ORAL
  Filled 2020-04-17: qty 2

## 2020-04-17 NOTE — Plan of Care (Signed)
Discharge done

## 2020-04-17 NOTE — Discharge Instructions (Signed)
POST OP INSTRUCTIONS AFTER COLON SURGERY  1. DIET: Be sure to include lots of fluids daily to stay hydrated.  Avoid fast food or heavy meals for the first couple of weeks as your are more likely to get nauseated. Avoid raw/uncooked fruits or vegetables for the first 4 weeks (its ok to have these if they are blended into smoothie form). If you have fruits/vegetables, make sure they are cooked until soft enough to mash on the roof of your mouth and chew your food well. Otherwise, diet as tolerated.  2. Take your usually prescribed home medications unless otherwise directed.  3. PAIN CONTROL: a. Pain is best controlled by a usual combination of three different methods TOGETHER: i. Ice/Heat ii. Over the counter pain medication iii. Prescription pain medication b. Most patients will experience some swelling and bruising around the surgical site.  Ice packs or heating pads (30-60 minutes up to 6 times a day) will help. Some people prefer to use ice alone, heat alone, alternating between ice & heat.  Experiment to what works for you.  Swelling and bruising can take several weeks to resolve.   c. It is helpful to take an over-the-counter pain medication regularly for the first few weeks: i. Ibuprofen (Motrin/Advil) - 200mg  tabs - take 3 tabs (600mg ) every 6 hours as needed for pain (unless you have been directed previously to avoid NSAIDs/ibuprofen) ii. Acetaminophen (Tylenol) - you may take 650mg  every 6 hours as needed. You can take this with motrin as they act differently on the body. If you are taking a narcotic pain medication that has acetaminophen in it, do not take over the counter tylenol at the same time. iii. NOTE: You may take both of these medications together - most patients  find it most helpful when alternating between the two (i.e. Ibuprofen at 6am, tylenol at 9am, ibuprofen at 12pm ...)  4. Avoid getting constipated.  Between the surgery and the pain medications, it is common to experience  some constipation.  Increasing fluid intake and taking a fiber supplement (such as Metamucil, Citrucel, FiberCon, MiraLax, etc) 1-2 times a day regularly will usually help prevent this problem from occurring.  A mild laxative (prune juice, Milk of Magnesia, MiraLax, etc) should be taken according to package directions if there are no bowel movements after 48 hours.    5. Dressing: Your incisions are covered in Dermabond which is like sterile superglue for the skin. This will come off on it's own in a couple weeks. It is waterproof and you may bathe normally starting the day after your surgery in a shower. Avoid baths/pools/lakes/oceans until your wounds have fully healed.  6. ACTIVITIES as tolerated:   a. Avoid heavy lifting (>10lbs or 1 gallon of milk) for the next 6 weeks. b. You may resume regular daily activities as tolerated--such as daily self-care, walking, climbing stairs--gradually increasing activities as tolerated.  If you can walk 30 minutes without difficulty, it is safe to try more intense activity such as jogging, treadmill, bicycling, low-impact aerobics.  c. DO NOT PUSH THROUGH PAIN.  Let pain be your guide: If it hurts to do something, don't do it. d. Dylan York may drive when you are no longer taking prescription pain medication, you can comfortably wear a seatbelt, and you can safely maneuver your car and apply brakes.  7. FOLLOW UP in our office a. Please call [facility] at [phone] to set up an appointment to see your surgeon in the office for a follow-up appointment  approximately 2 weeks after your surgery. b. Make sure that you call for this appointment the day you arrive home to insure a convenient appointment time.  9. If you have disability or family leave forms that need to be completed, you may have them completed by your primary care physician's office; for return to work instructions, please ask our office staff and they will be happy to assist you in obtaining this  documentation   When to call us [phone]: 1. Poor pain control 2. Reactions / problems with new medications (rash/itching, etc)  3. Fever over 101.5 F (38.5 C) 4. Inability to urinate 5. Nausea/vomiting 6. Worsening swelling or bruising 7. Continued bleeding from incision. 8. Increased pain, redness, or drainage from the incision  The clinic staff is available to answer your questions during regular business hours (8:30am-5pm).  Please don't hesitate to call and ask to speak to one of our nurses for clinical concerns.   A surgeon from [facility] is always on call at the hospitals   If you have a medical emergency, go to the nearest emergency room or call 911.  [facility], [place] [address] [place], [place], [place]  [address] MAIN: [phone] FAX: [phone] [email or web address]

## 2020-04-17 NOTE — Discharge Summary (Addendum)
Patient ID: Dylan York MRN: [id number] DOB/AGE: 1926-07-04 86 y.o.  Admit date: 04/13/2020 Discharge date: 04/17/2020  Discharge Diagnoses Patient Active Problem List   Diagnosis Date Noted  . IDA (iron deficiency anemia) 04/15/2020  . DM (diabetes mellitus) ([place]) 04/14/2020  . Hyperglycemia 04/14/2020  . Adenocarcinoma of transverse colon s/p robotic colectomy 04/13/2020 04/14/2020  . S/P laparoscopic colectomy 04/13/2020  . Dysphagia 11/08/2019  . Anemia, unspecified 11/08/2019  . Bilateral hand numbness 05/12/2017  . Hyperlipidemia 10/02/2016  . Essential hypertension 10/02/2016  . Chronic diastolic heart failure ([place]) 09/11/2016  . Right homonymous hemianopsia 09/07/2016  . Cerebrovascular accident (CVA) due to embolism of left middle cerebral artery ([place]) 09/07/2016  . Chronic prostatitis 07/02/2015  . Hypothyroidism 09/26/2014  . Lower extremity edema 08/23/2013  . PAF (paroxysmal atrial fibrillation) ([place]) 08/08/2013  . Sinus bradycardia, with HR in the 40s BB stopped. 08/08/2013  . Labile hypertension 08/07/2013  . Hypertensive crisis 08/05/2013  . Anticoagulated 05/05/2013  . AF (atrial fibrillation) ([place]) 04/04/2013  . Hypertensive heart disease 04/04/2013  . Benign prostatic hyperplasia with lower urinary tract symptoms 04/04/2013  . GERD (gastroesophageal reflux disease) 04/04/2013  . Sick sinus syndrome ([place]) 04/04/2013  . Renal cyst 11/03/2011    Consultants Internal medicine - assistance in management of comorbidities  Procedures OR 04/13/20 PROCEDURE:   1. Robotic assisted segmental colectomy 2. Takedown of splenic flexure 3.  Intraoperative assessment of perfusion 4. Bilateral transversus abdominus plane blocks  Hospital Course: He was admitted postoperatively and recovered quite well. His diet advanced, pain controlled without narcotics, tolerating diet and having bowel function. Eliquis was restarted and hgb remained stable. PT/OT had recommended  home health PT/OT and this was arranged. He was medically stable for discharge  Pathology returned: A. COLON, TRANSVERSE, SPLENIC FLEXURE AND DESCENDING, RESECTION:  - Invasive colonic adenocarcinoma, 6 cm.  - Tumor invades through the muscularis propria into pericolonic tissues.  - Margins of resection are not involved.  - Metastatic carcinoma in (1) of (10) lymph nodes.  - See oncology table and comment.   (pT3N1aM0 = Stage III colon cancer)  And this was reviewed with him as well as submitted to MDT for review. An oncology referral was made as well. His questions were answered and he voiced understanding.   On the day of discharge, he continued to do great without issues. Eating, walking with walker, pain well controlled without any narcotics, requesting to go home. Home PT/OT was being arranged and pending acceptance, cleared for discharge home.  AF VSS NAD, comfortable and at baseline in good spirits Abdomen is soft, nontender, nondistended; incisions c/d/i without erythema   Allergies as of 04/17/2020      Reactions   Amiodarone Other (See Comments)   Not good for liver   Multaq [dronedarone] Shortness Of Breath      Medication List    TAKE these medications   CENTRUM SILVER PO Take 1 tablet by mouth daily.   Combigan 0.2-0.5 % ophthalmic solution Generic drug: brimonidine-timolol Place 1 drop into both eyes 2 (two) times daily.   docusate sodium 100 MG capsule Commonly known as: Colace Take 2 capsules (200 mg total) by mouth at bedtime.   Eliquis 5 MG Tabs tablet Generic drug: apixaban TAKE 1 TABLET BY MOUTH TWICE DAILY What changed: how much to take   finasteride 5 MG tablet Commonly known as: PROSCAR Take 5 mg by mouth daily.   levothyroxine 75 MCG tablet Commonly known as: SYNTHROID Take 75 mcg by  mouth daily before breakfast.   LORazepam 1 MG tablet Commonly known as: ATIVAN Take 0.5 mg by mouth at bedtime as needed for sleep.   metoprolol  succinate 50 MG 24 hr tablet Commonly known as: TOPROL-XL TAKE 2 TABLETS BY MOUTH TWICE A DAY (50 MG TWICE A DAY) What changed:   how much to take  how to take this  when to take this   Sulfacetamide Sodium-Sulfur 10-5 % Crea Apply topically daily. To forehead   tamsulosin 0.4 MG Caps capsule Commonly known as: FLOMAX TAKE ONE CAPSULE BY MOUTH DAILY What changed: when to take this   Vitamin D (Cholecalciferol) 50 MCG (2000 UT) Caps Take 2,000 Units by mouth daily.         Follow-up Information    Care, [facility] Follow up.   Specialty: [place] Why: to provide home health physical and occupational therapy Contact information: [address] [place] [address] [phone]               Sharon Mt. Dema Severin, M.D. [facility]

## 2020-04-18 ENCOUNTER — Encounter: Payer: Self-pay | Admitting: Oncology

## 2020-04-18 ENCOUNTER — Telehealth: Payer: Self-pay | Admitting: Oncology

## 2020-04-18 NOTE — Telephone Encounter (Signed)
Received a new pt referral from Dr. Lovena Neighbours at [facility] for colonc cancer. Dylan York has been cld and scheduled to see Dr. Benay Spice on 7/1 at 2pm. Pt aware to arrive 15 minutes early.

## 2020-04-20 DIAGNOSIS — R1310 Dysphagia, unspecified: Secondary | ICD-10-CM | POA: Diagnosis not present

## 2020-04-20 DIAGNOSIS — D630 Anemia in neoplastic disease: Secondary | ICD-10-CM | POA: Diagnosis not present

## 2020-04-20 DIAGNOSIS — D509 Iron deficiency anemia, unspecified: Secondary | ICD-10-CM | POA: Diagnosis not present

## 2020-04-20 DIAGNOSIS — I480 Paroxysmal atrial fibrillation: Secondary | ICD-10-CM | POA: Diagnosis not present

## 2020-04-20 DIAGNOSIS — N281 Cyst of kidney, acquired: Secondary | ICD-10-CM | POA: Diagnosis not present

## 2020-04-20 DIAGNOSIS — C185 Malignant neoplasm of splenic flexure: Secondary | ICD-10-CM | POA: Diagnosis not present

## 2020-04-20 DIAGNOSIS — D709 Neutropenia, unspecified: Secondary | ICD-10-CM | POA: Diagnosis not present

## 2020-04-20 DIAGNOSIS — H409 Unspecified glaucoma: Secondary | ICD-10-CM | POA: Diagnosis not present

## 2020-04-20 DIAGNOSIS — I0981 Rheumatic heart failure: Secondary | ICD-10-CM | POA: Diagnosis not present

## 2020-04-20 DIAGNOSIS — E039 Hypothyroidism, unspecified: Secondary | ICD-10-CM | POA: Diagnosis not present

## 2020-04-20 DIAGNOSIS — K219 Gastro-esophageal reflux disease without esophagitis: Secondary | ICD-10-CM | POA: Diagnosis not present

## 2020-04-20 DIAGNOSIS — I110 Hypertensive heart disease with heart failure: Secondary | ICD-10-CM | POA: Diagnosis not present

## 2020-04-20 DIAGNOSIS — I071 Rheumatic tricuspid insufficiency: Secondary | ICD-10-CM | POA: Diagnosis not present

## 2020-04-20 DIAGNOSIS — H53461 Homonymous bilateral field defects, right side: Secondary | ICD-10-CM | POA: Diagnosis not present

## 2020-04-20 DIAGNOSIS — I5032 Chronic diastolic (congestive) heart failure: Secondary | ICD-10-CM | POA: Diagnosis not present

## 2020-04-20 DIAGNOSIS — Z483 Aftercare following surgery for neoplasm: Secondary | ICD-10-CM | POA: Diagnosis not present

## 2020-04-20 DIAGNOSIS — I495 Sick sinus syndrome: Secondary | ICD-10-CM | POA: Diagnosis not present

## 2020-04-20 DIAGNOSIS — E785 Hyperlipidemia, unspecified: Secondary | ICD-10-CM | POA: Diagnosis not present

## 2020-04-20 DIAGNOSIS — D849 Immunodeficiency, unspecified: Secondary | ICD-10-CM | POA: Diagnosis not present

## 2020-04-20 DIAGNOSIS — E1165 Type 2 diabetes mellitus with hyperglycemia: Secondary | ICD-10-CM | POA: Diagnosis not present

## 2020-04-20 DIAGNOSIS — K5730 Diverticulosis of large intestine without perforation or abscess without bleeding: Secondary | ICD-10-CM | POA: Diagnosis not present

## 2020-04-20 DIAGNOSIS — L719 Rosacea, unspecified: Secondary | ICD-10-CM | POA: Diagnosis not present

## 2020-04-20 DIAGNOSIS — C184 Malignant neoplasm of transverse colon: Secondary | ICD-10-CM | POA: Diagnosis not present

## 2020-04-20 DIAGNOSIS — I471 Supraventricular tachycardia: Secondary | ICD-10-CM | POA: Diagnosis not present

## 2020-04-20 DIAGNOSIS — N400 Enlarged prostate without lower urinary tract symptoms: Secondary | ICD-10-CM | POA: Diagnosis not present

## 2020-04-23 DIAGNOSIS — Z483 Aftercare following surgery for neoplasm: Secondary | ICD-10-CM | POA: Diagnosis not present

## 2020-04-23 DIAGNOSIS — E1165 Type 2 diabetes mellitus with hyperglycemia: Secondary | ICD-10-CM | POA: Diagnosis not present

## 2020-04-23 DIAGNOSIS — C184 Malignant neoplasm of transverse colon: Secondary | ICD-10-CM | POA: Diagnosis not present

## 2020-04-23 DIAGNOSIS — C185 Malignant neoplasm of splenic flexure: Secondary | ICD-10-CM | POA: Diagnosis not present

## 2020-04-23 DIAGNOSIS — K5730 Diverticulosis of large intestine without perforation or abscess without bleeding: Secondary | ICD-10-CM | POA: Diagnosis not present

## 2020-04-23 DIAGNOSIS — D630 Anemia in neoplastic disease: Secondary | ICD-10-CM | POA: Diagnosis not present

## 2020-04-24 DIAGNOSIS — C185 Malignant neoplasm of splenic flexure: Secondary | ICD-10-CM | POA: Diagnosis not present

## 2020-04-24 DIAGNOSIS — C184 Malignant neoplasm of transverse colon: Secondary | ICD-10-CM | POA: Diagnosis not present

## 2020-04-24 DIAGNOSIS — E1165 Type 2 diabetes mellitus with hyperglycemia: Secondary | ICD-10-CM | POA: Diagnosis not present

## 2020-04-24 DIAGNOSIS — K5730 Diverticulosis of large intestine without perforation or abscess without bleeding: Secondary | ICD-10-CM | POA: Diagnosis not present

## 2020-04-24 DIAGNOSIS — D630 Anemia in neoplastic disease: Secondary | ICD-10-CM | POA: Diagnosis not present

## 2020-04-24 DIAGNOSIS — Z483 Aftercare following surgery for neoplasm: Secondary | ICD-10-CM | POA: Diagnosis not present

## 2020-04-26 ENCOUNTER — Encounter (HOSPITAL_COMMUNITY): Payer: Self-pay | Admitting: Oncology

## 2020-04-26 DIAGNOSIS — E1165 Type 2 diabetes mellitus with hyperglycemia: Secondary | ICD-10-CM | POA: Diagnosis not present

## 2020-04-26 DIAGNOSIS — Z483 Aftercare following surgery for neoplasm: Secondary | ICD-10-CM | POA: Diagnosis not present

## 2020-04-26 DIAGNOSIS — C184 Malignant neoplasm of transverse colon: Secondary | ICD-10-CM | POA: Diagnosis not present

## 2020-04-26 DIAGNOSIS — C185 Malignant neoplasm of splenic flexure: Secondary | ICD-10-CM | POA: Diagnosis not present

## 2020-04-26 DIAGNOSIS — K5730 Diverticulosis of large intestine without perforation or abscess without bleeding: Secondary | ICD-10-CM | POA: Diagnosis not present

## 2020-04-26 DIAGNOSIS — D630 Anemia in neoplastic disease: Secondary | ICD-10-CM | POA: Diagnosis not present

## 2020-05-02 DIAGNOSIS — I10 Essential (primary) hypertension: Secondary | ICD-10-CM | POA: Diagnosis not present

## 2020-05-02 DIAGNOSIS — K5730 Diverticulosis of large intestine without perforation or abscess without bleeding: Secondary | ICD-10-CM | POA: Diagnosis not present

## 2020-05-02 DIAGNOSIS — D630 Anemia in neoplastic disease: Secondary | ICD-10-CM | POA: Diagnosis not present

## 2020-05-02 DIAGNOSIS — C185 Malignant neoplasm of splenic flexure: Secondary | ICD-10-CM | POA: Diagnosis not present

## 2020-05-02 DIAGNOSIS — E1165 Type 2 diabetes mellitus with hyperglycemia: Secondary | ICD-10-CM | POA: Diagnosis not present

## 2020-05-02 DIAGNOSIS — Z483 Aftercare following surgery for neoplasm: Secondary | ICD-10-CM | POA: Diagnosis not present

## 2020-05-02 DIAGNOSIS — C184 Malignant neoplasm of transverse colon: Secondary | ICD-10-CM | POA: Diagnosis not present

## 2020-05-02 DIAGNOSIS — N289 Disorder of kidney and ureter, unspecified: Secondary | ICD-10-CM | POA: Diagnosis not present

## 2020-05-03 ENCOUNTER — Encounter: Payer: Self-pay | Admitting: Oncology

## 2020-05-03 ENCOUNTER — Other Ambulatory Visit: Payer: Self-pay

## 2020-05-03 ENCOUNTER — Inpatient Hospital Stay: Payer: Medicare Other | Attending: Oncology | Admitting: Oncology

## 2020-05-03 VITALS — BP 136/97 | HR 64 | Temp 97.7°F | Resp 18 | Ht 68.0 in | Wt 136.3 lb

## 2020-05-03 DIAGNOSIS — C184 Malignant neoplasm of transverse colon: Secondary | ICD-10-CM | POA: Insufficient documentation

## 2020-05-03 DIAGNOSIS — R918 Other nonspecific abnormal finding of lung field: Secondary | ICD-10-CM | POA: Diagnosis not present

## 2020-05-03 DIAGNOSIS — E039 Hypothyroidism, unspecified: Secondary | ICD-10-CM | POA: Diagnosis not present

## 2020-05-03 DIAGNOSIS — I119 Hypertensive heart disease without heart failure: Secondary | ICD-10-CM | POA: Insufficient documentation

## 2020-05-03 DIAGNOSIS — N400 Enlarged prostate without lower urinary tract symptoms: Secondary | ICD-10-CM | POA: Insufficient documentation

## 2020-05-03 DIAGNOSIS — Z8673 Personal history of transient ischemic attack (TIA), and cerebral infarction without residual deficits: Secondary | ICD-10-CM | POA: Insufficient documentation

## 2020-05-03 DIAGNOSIS — H409 Unspecified glaucoma: Secondary | ICD-10-CM | POA: Insufficient documentation

## 2020-05-03 DIAGNOSIS — I4891 Unspecified atrial fibrillation: Secondary | ICD-10-CM | POA: Insufficient documentation

## 2020-05-03 NOTE — Progress Notes (Signed)
[facility] New Patient Consult   Requesting MD: Dylan Specking, Md [address] [place],  [place] [address]   Dylan York 86 y.o.  1926-01-22     Reason for Consult: Colon cancer   HPI: [name] had a single episode of rectal bleeding and was referred to Dr. Karilyn York.  A colonoscopy on 02/09/2020 revealed a nonobstructing mass at the splenic flexure.  No bleeding was present.  The mass was biopsied.  A 4 mm polyp was removed from the rectum.  The pathology from the splenic flexure biopsy revealed adenocarcinoma.  The rectal polyp returned as a tubular adenoma.  Mismatch repair protein expression is intact on the adenocarcinoma. A CT of the abdomen and pelvis on 02/16/2020 found the liver to appear normal.  An apple core lesion was noted at the distal transverse colon.  No suspicious lymphadenopathy. A CT of the chest on 03/06/2020 revealed multiple stable pulmonary nodules.  No evidence of metastatic disease.  He was referred to Dr. Cliffton York and was taken to the operating room for a robotic assisted segmental colectomy on 04/13/2020.  A tumor was found in the mid/distal transverse colon.  The liver appeared normal.  The peritoneal surface appeared normal.  The pathology ([id number]) revealed a moderate to poorly differentiated adenocarcinoma of the mid to distal transverse colon.  Tumor invaded through the muscularis propria into pericolonic tissues.  The resection margins are negative.  Lymphovascular invasion is present.  No perineural invasion.  No tumor deposits.  1 of 10 lymph nodes contained metastatic carcinoma.  The tumor returned MSI-stable.  Low-grade dysplasia is present focally in the closest resection margin.  [name] has recovered from surgery.  His bowels are functioning.  Past Medical History:  Diagnosis Date  . Atrial fibrillation (HCC)   . Atrial tachycardia (HCC)   . Biatrial enlargement    severe biatrial enlargement  . BPH (benign prostatic hyperplasia)   .  Colon cancer (HCC)-transverse colon, stage III (T3N1)  04/13/2020  . Glaucoma   . Graded compression stocking in place   . Hypertension   . Hypothyroidism   . Neutropenia (HCC)   . Rosacea   . Sinus bradycardia, with HR in the 40s BB stopped. 08/08/2013  . SSS (sick sinus syndrome) (HCC)   . Stroke [facility])    no residual  . Thyroid disease   . Tricuspid regurgitation     Past Surgical History:  Procedure Laterality Date  . APPENDECTOMY    . BACTERIAL OVERGROWTH TEST N/A 01/14/2016   Procedure: BACTERIAL OVERGROWTH TEST;  Surgeon: Malissa Hippo, MD;  Location: [facility];  Service: Endoscopy;  Laterality: N/A;  730  . BIOPSY  02/09/2020   Procedure: BIOPSY;  Surgeon: Malissa Hippo, MD;  Location: [facility];  Service: Endoscopy;;  . COLONOSCOPY N/A 02/09/2020   Procedure: COLONOSCOPY;  Surgeon: Malissa Hippo, MD;  Location: [facility];  Service: Endoscopy;  Laterality: N/A;  1225  . HERNIA REPAIR    . INGUINAL HERNIA REPAIR Right   . POLYPECTOMY  02/09/2020   Procedure: POLYPECTOMY;  Surgeon: Malissa Hippo, MD;  Location: [facility];  Service: Endoscopy;;  . TRANSURETHRAL RESECTION OF PROSTATE    . UMBILICAL HERNIA REPAIR      Medications: Reviewed  Allergies:  Allergies  Allergen Reactions  . Amiodarone Other (See Comments)    Not good for liver  . Multaq [Dronedarone] Shortness Of Breath    Family history: His mother  died of "stomach "cancer at age 65.  No other family history of cancer.  Social History:   He lives with his wife in [place].  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  He is not use cigarettes or alcohol.  No transfusion history.  No risk factor for HIV or hepatitis.  ROS:   Positives include: Solid and liquid dysphagia-evaluated by gastroenterology, one episode of rectal bleeding prior to surgery, weight loss  A complete ROS was otherwise negative.  Physical Exam:  Blood pressure (!) 136/97, pulse 64, temperature 97.7 F (36.5  C), temperature source Temporal, resp. rate 18, height '5\' 8"'$  (1.727 m), weight 136 lb 4.8 oz (61.8 kg), SpO2 100 %.  HEENT: Neck without mass Lungs: Clear bilaterally Cardiac: Regular rate and rhythm Abdomen: No hepatosplenomegaly, healed surgical incisions, no mass GU: Testes without mass Vascular: No leg edema Lymph nodes: No cervical, supraclavicular, axillary, or inguinal nodes Neurologic: Alert and oriented, the motor exam appears intact in the upper and lower extremities bilaterally Skin: No rash Musculoskeletal: No spine tenderness   LAB:  CBC  Lab Results  Component Value Date   WBC 5.7 04/16/2020   HGB 9.1 (L) 04/16/2020   HCT 28.4 (L) 04/16/2020   MCV 94.7 04/16/2020   PLT 123 (L) 04/16/2020   NEUTROABS 4.3 04/14/2020        CMP  Lab Results  Component Value Date   NA 135 04/16/2020   K 3.4 (L) 04/16/2020   CL 105 04/16/2020   CO2 23 04/16/2020   GLUCOSE 101 (H) 04/16/2020   BUN 21 04/16/2020   CREATININE 1.14 04/16/2020   CALCIUM 8.1 (L) 04/16/2020   PROT 6.7 04/10/2020   ALBUMIN 3.8 04/10/2020   AST 30 04/10/2020   ALT 21 04/10/2020   ALKPHOS 75 04/10/2020   BILITOT 1.6 (H) 04/10/2020   GFRNONAA 56 (L) 04/16/2020   GFRAA >60 04/16/2020     Lab Results  Component Value Date   CEA1 2.6 04/10/2020    Imaging: As per HPI, CT abdomen/pelvis images reviewed   Assessment/Plan:   1. Colon cancer-transverse, stage IIIb (T3N1), status post a segmental colectomy 04/13/2020  1/10 lymph nodes, lymphovascular invasion present, MSS, resection margins negative for invasive cancer, "closest "margin positive for low-grade dysplasia  Colonoscopy 02/09/2020-ulcerated nonobstructing mass at the splenic flexure, 4 mm polyp of the rectum, splenic flexure lesion-adenocarcinoma with intact mismatch repair protein expression, tubular adenoma at the rectal polyp  CT abdomen/pelvis 02/16/2020-apple core lesion at the distal transverse colon, no evidence of metastatic  disease  CT chest 03/06/2020-no evidence of metastatic disease, stable benign lung nodules 2. Rectal bleeding secondary to #1 3. Anemia-likely secondary to GI bleeding 4. Atrial fibrillation, maintained on apixaban 5. BPH 6. Glaucoma 7. Hypertension 8. Hypothyroid 9. History of a CVA 10. Positive resection margin for low-grade dysplasia on the colectomy specimen 04/13/2020   Disposition:   Mr. Blubaugh has been diagnosed with stage III colon cancer.  I discussed the prognosis and reviewed details of the surgical pathology report with Mr. Faucett and his wife.  His case was presented at the GI tumor conference last week.  He has a significant chance of developing recurrent colon cancer over the next several years.  I discussed the expected benefit from adjuvant capecitabine in this setting.  Mr. Minkin has an excellent performance status, but he is at an advanced age with several comorbid conditions.  We discussed potential toxicities associated with capecitabine including the chance for nausea, mucositis,  diarrhea, and hematologic toxicity.  We discussed the sun sensitivity, rash, hyperpigmentation, and hand/foot syndrome associated with capecitabine.  He remains undecided on whether to proceed with adjuvant capecitabine.  He understands colorectal cancer generally recurs within the first few years from diagnosis.  If he has recurrent cancer he may be a candidate for systemic therapy, but this would not be curative.   He may be a candidate for a follow-up surveillance colonoscopy given the low-grade dysplasia at the resection margin.  Mr. Buttram has lost 10 to 15 pounds over the last year.  He reports a good appetite.  I doubt the weight loss is related to the colon cancer diagnosis.  He will return for an office visit and further discussion in 1-2 weeks.  Mr. Ratchford does not appear to have hereditary nonpolyposis colon cancer syndrome, but his family members are at increased risk of developing  colorectal cancer and should receive appropriate screening.  Betsy Coder, MD  05/03/2020, 2:17 PM

## 2020-05-03 NOTE — Progress Notes (Signed)
Met with patient and his wife today at their initial medical oncology appointment with Dr. Benay Spice.  I explained my role as nurse navigator and he was given a card with my direct contact information.

## 2020-05-03 NOTE — Progress Notes (Signed)
Met with patient and his wife Dylan York today at his medical oncologist appointment with Dr. Benay Spice s/p surgery.  I gave him my card with my direct number and explained my role as nurse navigator.  They both verbalized an understanding.  He was given printed information on Xeloda to review and think about whether he wants to go on this medication.  He will return on 7/12 in follow up to discuss.

## 2020-05-09 DIAGNOSIS — K5730 Diverticulosis of large intestine without perforation or abscess without bleeding: Secondary | ICD-10-CM | POA: Diagnosis not present

## 2020-05-09 DIAGNOSIS — C184 Malignant neoplasm of transverse colon: Secondary | ICD-10-CM | POA: Diagnosis not present

## 2020-05-09 DIAGNOSIS — Z483 Aftercare following surgery for neoplasm: Secondary | ICD-10-CM | POA: Diagnosis not present

## 2020-05-09 DIAGNOSIS — D630 Anemia in neoplastic disease: Secondary | ICD-10-CM | POA: Diagnosis not present

## 2020-05-09 DIAGNOSIS — C185 Malignant neoplasm of splenic flexure: Secondary | ICD-10-CM | POA: Diagnosis not present

## 2020-05-09 DIAGNOSIS — E1165 Type 2 diabetes mellitus with hyperglycemia: Secondary | ICD-10-CM | POA: Diagnosis not present

## 2020-05-14 ENCOUNTER — Telehealth: Payer: Self-pay | Admitting: Oncology

## 2020-05-14 ENCOUNTER — Other Ambulatory Visit: Payer: Self-pay

## 2020-05-14 ENCOUNTER — Inpatient Hospital Stay (HOSPITAL_BASED_OUTPATIENT_CLINIC_OR_DEPARTMENT_OTHER): Payer: Medicare Other | Admitting: Nurse Practitioner

## 2020-05-14 VITALS — BP 146/72 | HR 87 | Temp 97.5°F | Resp 17 | Ht 68.0 in | Wt 138.3 lb

## 2020-05-14 DIAGNOSIS — E039 Hypothyroidism, unspecified: Secondary | ICD-10-CM | POA: Diagnosis not present

## 2020-05-14 DIAGNOSIS — Z8673 Personal history of transient ischemic attack (TIA), and cerebral infarction without residual deficits: Secondary | ICD-10-CM | POA: Diagnosis not present

## 2020-05-14 DIAGNOSIS — C184 Malignant neoplasm of transverse colon: Secondary | ICD-10-CM | POA: Diagnosis not present

## 2020-05-14 DIAGNOSIS — R918 Other nonspecific abnormal finding of lung field: Secondary | ICD-10-CM | POA: Diagnosis not present

## 2020-05-14 DIAGNOSIS — I4891 Unspecified atrial fibrillation: Secondary | ICD-10-CM | POA: Diagnosis not present

## 2020-05-14 DIAGNOSIS — N400 Enlarged prostate without lower urinary tract symptoms: Secondary | ICD-10-CM | POA: Diagnosis not present

## 2020-05-14 DIAGNOSIS — I119 Hypertensive heart disease without heart failure: Secondary | ICD-10-CM | POA: Diagnosis not present

## 2020-05-14 DIAGNOSIS — H409 Unspecified glaucoma: Secondary | ICD-10-CM | POA: Diagnosis not present

## 2020-05-14 NOTE — Progress Notes (Addendum)
  [facility] OFFICE PROGRESS NOTE   Diagnosis: Colon cancer  INTERVAL HISTORY:   Dylan York returns as scheduled.  He feels he is recovering from surgery well.  Bowels are moving.  Good appetite.  No nausea or vomiting.  He has decided against adjuvant Xeloda.  Objective:  Vital signs in last 24 hours:  Blood pressure (!) 146/72, pulse 87, temperature (!) 97.5 F (36.4 C), temperature source Temporal, resp. rate 17, height '5\' 8"'$  (1.727 m), weight 138 lb 4.8 oz (62.7 kg), SpO2 98 %.    GI: Abdomen soft and nontender.  No hepatomegaly.  Healed surgical incisions. Vascular: No leg edema. Neuro: Alert and oriented. Skin: Ecchymoses dorsal aspect of both hands.   Lab Results:  Lab Results  Component Value Date   WBC 5.7 04/16/2020   HGB 9.1 (L) 04/16/2020   HCT 28.4 (L) 04/16/2020   MCV 94.7 04/16/2020   PLT 123 (L) 04/16/2020   NEUTROABS 4.3 04/14/2020    Imaging:  No results found.  Medications: I have reviewed the patient's current medications.  Assessment/Plan: 1. Colon cancer-transverse, stage IIIb (T3N1), status post a segmental colectomy 04/13/2020 ? 1/10 lymph nodes, lymphovascular invasion present, MSS, resection margins negative for invasive cancer, "closest" margin positive for low-grade dysplasia ? Colonoscopy 02/09/2020-ulcerated nonobstructing mass at the splenic flexure, 4 mm polyp of the rectum, splenic flexure lesion-adenocarcinoma with intact mismatch repair protein expression, tubular adenoma at the rectal polyp ? CT abdomen/pelvis 02/16/2020-apple core lesion at the distal transverse colon, no evidence of metastatic disease ? CT chest 03/06/2020-no evidence of metastatic disease, stable benign lung nodules 2. Rectal bleeding secondary to #1 3. Anemia-likely secondary to GI bleeding 4. Atrial fibrillation, maintained on apixaban 5. BPH 6. Glaucoma 7. Hypertension 8. Hypothyroid 9. History of a CVA 10. Positive resection margin for low-grade  dysplasia on the colectomy specimen 04/13/2020  Disposition: [name] appears stable.  We again reviewed the diagnosis, prognosis and treatment options at today's appointment.  He understands the optimal time to begin adjuvant therapy is within 4 to 6 weeks after surgery.  He has decided against adjuvant Xeloda.  He would like to continue routine follow-up in our office.  He will return for a CEA and follow-up visit in 4 months.    Patient seen with Dr. Benay Spice.    Ned Card ANP/GNP-BC   05/14/2020  1:39 PM Mr. Whitenack has decided against adjuvant therapy.  We discussed the prognosis and expected benefit of adjuvant therapy with him today.  He understands no therapy will be curative if he develops recurrent disease.  The plan is to follow him with surveillance.  Julieanne Manson, MD

## 2020-05-14 NOTE — Telephone Encounter (Signed)
Scheduled appt per 7/12 los - gave patient AVS

## 2020-05-15 ENCOUNTER — Ambulatory Visit (INDEPENDENT_AMBULATORY_CARE_PROVIDER_SITE_OTHER): Payer: Medicare Other | Admitting: Internal Medicine

## 2020-05-15 ENCOUNTER — Other Ambulatory Visit (INDEPENDENT_AMBULATORY_CARE_PROVIDER_SITE_OTHER): Payer: Self-pay | Admitting: *Deleted

## 2020-05-15 ENCOUNTER — Encounter (INDEPENDENT_AMBULATORY_CARE_PROVIDER_SITE_OTHER): Payer: Self-pay | Admitting: Internal Medicine

## 2020-05-15 VITALS — BP 147/110 | HR 111 | Temp 97.7°F | Ht 68.0 in | Wt 138.4 lb

## 2020-05-15 DIAGNOSIS — D649 Anemia, unspecified: Secondary | ICD-10-CM

## 2020-05-15 DIAGNOSIS — C184 Malignant neoplasm of transverse colon: Secondary | ICD-10-CM | POA: Diagnosis not present

## 2020-05-15 NOTE — Progress Notes (Signed)
Presenting complaint;  History of colon carcinoma. Anemia.  Database and subjective:  Patient is 86 year old Caucasian male who is here for scheduled visit accompanied by his wife Alice.  He underwent colonoscopy on 02/09/2020 for rectal bleeding anemia.  He was found to have colon carcinoma.  He had robotic assisted segmental colon resection on 04/13/2020 with uneventful recovery.  1 out of 10 [place] nodes were positive for metastatic disease.  Patient was referred to Dr. Benay Spice of oncology.  Patient has seen him twice.  He saw him yesterday.  Patient has decided not to proceed with chemotherapy.  He also wanted my opinion knowing well that I am not an oncologist. He states he has not had any more rectal bleeding.  He states he dropped weight down to about 134 pounds.  He has gained 3 to 4 pounds.  He has very good appetite.  He generally has 1 formed stool daily.  He denies nausea vomiting or abdominal pain.  He states he has fully recovered from surgery.  He drove all the way from [place] to [place] and back yesterday without any difficulty.  Current Medications: Outpatient Encounter Medications as of 05/15/2020  Medication Sig  . COMBIGAN 0.2-0.5 % ophthalmic solution Place 1 drop into both eyes 2 (two) times daily.  Marland Kitchen docusate sodium (COLACE) 100 MG capsule Take 2 capsules (200 mg total) by mouth at bedtime.  Marland Kitchen ELIQUIS 5 MG TABS tablet TAKE 1 TABLET BY MOUTH TWICE DAILY (Patient taking differently: Take 5 mg by mouth 2 (two) times daily. )  . finasteride (PROSCAR) 5 MG tablet Take 5 mg by mouth daily.  Marland Kitchen levothyroxine (SYNTHROID, LEVOTHROID) 75 MCG tablet Take 75 mcg by mouth daily before breakfast.   . LORazepam (ATIVAN) 1 MG tablet Take 0.5 mg by mouth at bedtime as needed for sleep.   . metoprolol succinate (TOPROL-XL) 50 MG 24 hr tablet TAKE 2 TABLETS BY MOUTH TWICE A DAY (50 MG TWICE A DAY) (Patient taking differently: Take 50 mg by mouth in the morning and at bedtime. TAKE 2 TABLETS BY  MOUTH TWICE A DAY (50 MG TWICE A DAY))  . Multiple Vitamins-Minerals (CENTRUM SILVER PO) Take 1 tablet by mouth daily.   . Sulfacetamide Sodium-Sulfur 10-5 % CREA Apply topically daily. To forehead  . tamsulosin (FLOMAX) 0.4 MG CAPS capsule TAKE ONE CAPSULE BY MOUTH DAILY (Patient taking differently: Take 0.4 mg by mouth at bedtime. )  . Vitamin D, Cholecalciferol, 50 MCG (2000 UT) CAPS Take 2,000 Units by mouth daily.    No facility-administered encounter medications on file as of 05/15/2020.     Objective: Blood pressure (!) 147/110, pulse (!) 111, temperature 97.7 F (36.5 C), temperature source Oral, height '5\' 8"'$  (1.727 m), weight 138 lb 6.4 oz (62.8 kg). Patient is alert and in no acute distress. He is wearing a mask. Conjunctiva is pink. Sclera is nonicteric Oropharyngeal mucosa is normal. No neck masses or thyromegaly noted. Cardiac exam with regular rhythm normal S1 and S2. No murmur or gallop noted. Lungs are clear to auscultation. Abdomen is symmetrical.  He has laparoscopy scars in upper abdomen.  Abdomen is soft and nontender with organomegaly or masses. No LE edema or clubbing noted.  Labs/studies Results:  CBC Latest Ref Rng & Units 04/16/2020 04/15/2020 04/14/2020  WBC 4.0 - 10.5 K/uL 5.7 7.3 5.2  Hemoglobin 13.0 - 17.0 g/dL 9.1(L) 8.9(L) 9.3(L)  Hematocrit 39 - 52 % 28.4(L) 27.5(L) 29.3(L)  Platelets 150 - 400 K/uL 123(L) 123(L) 125(L)  CMP Latest Ref Rng & Units 04/16/2020 04/15/2020 04/14/2020  Glucose 70 - 99 mg/dL 101(H) 108(H) 207(H)  BUN 8 - 23 mg/dL '21 20 13  '$ Creatinine 0.61 - 1.24 mg/dL 1.14 1.10 1.05  Sodium 135 - 145 mmol/L 135 136 133(L)  Potassium 3.5 - 5.1 mmol/L 3.4(L) 4.0 4.2  Chloride 98 - 111 mmol/L 105 107 105  CO2 22 - 32 mmol/L 23 23 21(L)  Calcium 8.9 - 10.3 mg/dL 8.1(L) 7.9(L) 7.8(L)  Total Protein 6.5 - 8.1 g/dL - - -  Total Bilirubin 0.3 - 1.2 mg/dL - - -  Alkaline Phos 38 - 126 U/L - - -  AST 15 - 41 U/L - - -  ALT 0 - 44 U/L - - -     Hepatic Function Latest Ref Rng & Units 04/10/2020 11/10/2018 08/03/2018  Total Protein 6.5 - 8.1 g/dL 6.7 5.8(L) 5.6(L)  Albumin 3.5 - 5.0 g/dL 3.8 3.8 3.5  AST 15 - 41 U/L 30 29 32  ALT 0 - 44 U/L '21 19 26  '$ Alk Phosphatase 38 - 126 U/L 75 89 87  Total Bilirubin 0.3 - 1.2 mg/dL 1.6(H) 0.8 0.7  Bilirubin, Direct 0.00 - 0.40 mg/dL - - 0.22      Assessment:  #1.  History of colon carcinoma.  He had stage IIIb disease.  He has decided not to proceed with chemotherapy.  I have told him that the decision that he has made is correct decision given his own conviction.  Hopefully he will remain in remission. He is to follow-up with Dr. Learta Codding in November 2021.  #2.  Anemia secondary to GI bleed.  He is due for blood work.   Plan:  Patient will go to the lab for CBC, serum iron TIBC and ferritin. Office visit on as-needed basis.

## 2020-05-15 NOTE — Patient Instructions (Signed)
Physician will call with results of blood test when completed. 

## 2020-05-16 LAB — CBC
Hematocrit: 35.4 % — ABNORMAL LOW (ref 37.5–51.0)
Hemoglobin: 11.7 g/dL — ABNORMAL LOW (ref 13.0–17.7)
MCH: 31.5 pg (ref 26.6–33.0)
MCHC: 33.1 g/dL (ref 31.5–35.7)
MCV: 95 fL (ref 79–97)
Platelets: 161 10*3/uL (ref 150–450)
RBC: 3.72 x10E6/uL — ABNORMAL LOW (ref 4.14–5.80)
RDW: 14.5 % (ref 11.6–15.4)
WBC: 4.9 10*3/uL (ref 3.4–10.8)

## 2020-05-16 LAB — IRON,TIBC AND FERRITIN PANEL
Ferritin: 31 ng/mL (ref 30–400)
Iron Saturation: 13 % — ABNORMAL LOW (ref 15–55)
Iron: 46 ug/dL (ref 38–169)
Total Iron Binding Capacity: 355 ug/dL (ref 250–450)
UIBC: 309 ug/dL (ref 111–343)

## 2020-05-22 DIAGNOSIS — Z85820 Personal history of malignant melanoma of skin: Secondary | ICD-10-CM | POA: Diagnosis not present

## 2020-05-22 DIAGNOSIS — L570 Actinic keratosis: Secondary | ICD-10-CM | POA: Diagnosis not present

## 2020-05-22 DIAGNOSIS — L718 Other rosacea: Secondary | ICD-10-CM | POA: Diagnosis not present

## 2020-05-22 DIAGNOSIS — L821 Other seborrheic keratosis: Secondary | ICD-10-CM | POA: Diagnosis not present

## 2020-05-22 DIAGNOSIS — Z85828 Personal history of other malignant neoplasm of skin: Secondary | ICD-10-CM | POA: Diagnosis not present

## 2020-06-10 ENCOUNTER — Other Ambulatory Visit: Payer: Self-pay | Admitting: Adult Health

## 2020-06-13 NOTE — Telephone Encounter (Signed)
Okay to refill? 

## 2020-07-03 DIAGNOSIS — N289 Disorder of kidney and ureter, unspecified: Secondary | ICD-10-CM | POA: Diagnosis not present

## 2020-07-03 DIAGNOSIS — I10 Essential (primary) hypertension: Secondary | ICD-10-CM | POA: Diagnosis not present

## 2020-07-10 ENCOUNTER — Telehealth: Payer: Self-pay | Admitting: Internal Medicine

## 2020-07-10 NOTE — Telephone Encounter (Signed)
Received call from Dylan York tonight regarding his blood pressure.  Felt unusually tired today after doing errands so took a nap at 3pm and checked BP when he woke up at 5pm - this was elevated to 147/109. He checks daily and BP is typically around 110-120/80s. BP at 8pm was 158/118 then 184/125 at 8:30 so he first called [employer]. He was instructed to take evening meds, including metoprolol at [facility]. By 9:30 BP was down to 163/111.   Only symptom has been the fatigue earlier today and after awakening from his nap he has been feeling fine. He denies any chest pain, dyspnea, headache, vision changes. His blood pressure has historically been labile at times. He does have chronic atrial fibrillation which he reports is asymptomatic. His HR has come down from 110s to 80s after the metoprolol. The atrial fibrillation may account for some of the variability in BP measurements by his home automated cuff. Given that he is asymptomatic and BP has come down after evening medications he will recheck tomorrow morning and let us know if pressure readings remain elevated. Reviewed alarm symptoms that should prompt ED evaluation. He reports understanding and is agreeable to the plan.

## 2020-07-12 NOTE — Telephone Encounter (Signed)
Try to arrange the patient to be seen in the office within the next week with either [facility] or [facility].D.

## 2020-07-13 NOTE — Telephone Encounter (Signed)
Messages sent to schedulers to see if Pharmacists have open appts next week.

## 2020-07-16 NOTE — Telephone Encounter (Signed)
Pt has appt with pharmacists on 07/17/20 at 10:00 am

## 2020-07-17 ENCOUNTER — Other Ambulatory Visit: Payer: Self-pay

## 2020-07-17 ENCOUNTER — Ambulatory Visit (INDEPENDENT_AMBULATORY_CARE_PROVIDER_SITE_OTHER): Payer: Medicare Other | Admitting: Pharmacist Clinician (PhC)/ Clinical Pharmacy Specialist

## 2020-07-17 VITALS — BP 160/118 | HR 104 | Resp 15 | Ht 68.0 in | Wt 141.6 lb

## 2020-07-17 DIAGNOSIS — I10 Essential (primary) hypertension: Secondary | ICD-10-CM | POA: Diagnosis not present

## 2020-07-17 MED ORDER — SPIRONOLACTONE 25 MG PO TABS
12.5000 mg | ORAL_TABLET | Freq: Every day | ORAL | 3 refills | Status: DC
Start: 2020-07-17 — End: 2020-11-29

## 2020-07-17 NOTE — Assessment & Plan Note (Signed)
Patient with mixed systolic/disatolic hypertension currently only on metoprolol.  Will add spironolactone 12.5 mg daily back to his regimen.  Will titrated dose slowly, as patient has history of random low pressure and is an increased fall risk with his age.  He is to continue with regular home BP monitoring and will have him return in 6 weeks for follow up.  He will need to have BMET drawn in 2 weeks with the addition of spironolactone.  Lab orders will be mailed to patient next week to remind him.

## 2020-07-17 NOTE — Progress Notes (Signed)
07/17/2020 ASHAZ ROBLING 16-Sep-1926 [id number]   HPI:  Dylan York is a 86 y.o. male patient of Dr Claiborne Billings, with a PMH below who presents today for hypertension clinic evaluation.  He had been seen in [facility] several times in the past, last in 2017.  At that time he was mostly well controlled, although he did have both hyper- and hypo- tensive episodes.  Since then he has managed to cut back on BP medications and currently takes only metoprolol succ 50 mg bid.  He was recently diagnosed with colon cancer and had segmental colectomy.  He notes that 10 lymph nodes were removed and 1 was found to have metastatic cancer.  He has chosen not to go thru chemotherapy at his stage in life.    Today he returns for follow up.  He continues to monitor home BP readings multiple times per day.  See below for information.  He keeps meticulous notes from prior visits and would like to re-start spironolactone, as he believed that was the "best" BP medication that he previously took.  It was stopped by Jory Sims in early 2020 due to low normal blood pressure and risk of fall/hypotension.    Past Medical History: Heart failure Chronic diastolic  ASCVD CVA due to embolism 2017  hyperlipidemia 10/2019:  TC 134, TG 86, HDL 51, LDL 66 - no medications  AF Persistent -  on Eliquis, metoprolol  DM2 6/21 A1c 6.4 - diet controlled  hypothyroidism 10/2019 TSH 0.5, on levothyroixine 75 mcg     Blood Pressure Goal:  130/80  Current Medications: metoprolol succ 50 mg bid  Social Hx: former smoker (quit ~45 yrs ago), no alcohol, small amts of caffeine (half caf coffee)  Diet: eating out - Meals on Wheels 5 days per week; no added salt, mostly avoids deep fried foods;   Exercise: no regular exercise; previously owned tree farm  Home BP readings: mostly 409-811'B systolic; diastolic 14-782  Intolerances: amiodarone, Multaq  Labs: 6/21:  Na 135, K 3.4, Glu 101, BUN 21, SCr 1.14, GFR 56  Wt Readings from  Last 3 Encounters:  07/17/20 141 lb 9.6 oz (64.2 kg)  05/15/20 138 lb 6.4 oz (62.8 kg)  05/14/20 138 lb 4.8 oz (62.7 kg)   BP Readings from Last 3 Encounters:  07/17/20 (!) 160/118  05/15/20 (!) 147/110  05/14/20 (!) 146/72   Pulse Readings from Last 3 Encounters:  07/17/20 (!) 104  05/15/20 (!) 111  05/14/20 87    Current Outpatient Medications  Medication Sig Dispense Refill  . COMBIGAN 0.2-0.5 % ophthalmic solution Place 1 drop into both eyes 2 (two) times daily.    Marland Kitchen docusate sodium (COLACE) 100 MG capsule Take 2 capsules (200 mg total) by mouth at bedtime. 60 capsule 0  . ELIQUIS 5 MG TABS tablet TAKE 1 TABLET BY MOUTH TWICE DAILY (Patient taking differently: Take 5 mg by mouth 2 (two) times daily. ) 180 tablet 1  . finasteride (PROSCAR) 5 MG tablet Take 5 mg by mouth daily.    Marland Kitchen levothyroxine (SYNTHROID, LEVOTHROID) 75 MCG tablet Take 75 mcg by mouth daily before breakfast.     . LORazepam (ATIVAN) 1 MG tablet Take 0.5 mg by mouth at bedtime as needed for sleep.   2  . metoprolol succinate (TOPROL-XL) 50 MG 24 hr tablet TAKE 2 TABLETS BY MOUTH TWICE A DAY (50 MG TWICE A DAY) (Patient taking differently: Take 50 mg by mouth in the morning and  at bedtime. TAKE 2 TABLETS BY MOUTH TWICE A DAY (50 MG TWICE A DAY)) 180 tablet 3  . Multiple Vitamins-Minerals (CENTRUM SILVER PO) Take 1 tablet by mouth daily.     . Sulfacetamide Sodium-Sulfur 10-5 % CREA Apply topically daily. To forehead    . tamsulosin (FLOMAX) 0.4 MG CAPS capsule Take 1 capsule (0.4 mg total) by mouth at bedtime. 90 capsule 3  . Vitamin D, Cholecalciferol, 50 MCG (2000 UT) CAPS Take 2,000 Units by mouth daily.     Marland Kitchen spironolactone (ALDACTONE) 25 MG tablet Take 0.5 tablets (12.5 mg total) by mouth daily. 45 tablet 3   No current facility-administered medications for this visit.    Allergies  Allergen Reactions  . Amiodarone Other (See Comments)    Not good for liver  . Multaq [Dronedarone] Shortness Of Breath     Past Medical History:  Diagnosis Date  . Atrial fibrillation ([place])   . Atrial tachycardia ([place])   . Biatrial enlargement    severe biatrial enlargement  . BPH (benign prostatic hyperplasia)   . Colon cancer ([place])   . Glaucoma   . Graded compression stocking in place   . Hypertension   . Hypothyroidism   . Neutropenia ([place])   . Rosacea   . Sinus bradycardia, with HR in the 40s BB stopped. 08/08/2013  . SSS (sick sinus syndrome) ([place])   . Stroke [facility])    no residual  . Thyroid disease   . Tricuspid regurgitation     Blood pressure (!) 160/118, pulse (!) 104, resp. rate 15, height 5\' 8"  (1.727 m), weight 141 lb 9.6 oz (64.2 kg), SpO2 98 %.  Essential hypertension Patient with mixed systolic/disatolic hypertension currently only on metoprolol.  Will add spironolactone 12.5 mg daily back to his regimen.  Will titrated dose slowly, as patient has history of random low pressure and is an increased fall risk with his age.  He is to continue with regular home BP monitoring and will have him return in 6 weeks for follow up.  He will need to have BMET drawn in 2 weeks with the addition of spironolactone.  Lab orders will be mailed to patient next week to remind him.     Tommy Medal PharmD CPP [facility] [address] [place], [place] [address] [phone]

## 2020-07-17 NOTE — Patient Instructions (Addendum)
Return for a a follow up appointment October 26  Go to the lab in 2 weeks  - about October 1  Check your blood pressure at home daily and keep record of the readings.  Take your BP meds as follows:  Start spironolactone 12.5 mg (1/2 tablet) daily  Continue with all other medications.  Bring all of your meds, your BP cuff and your record of home blood pressures to your next appointment.  Exercise as you're able, try to walk approximately 30 minutes per day.  Keep salt intake to a minimum, especially watch canned and prepared boxed foods.  Eat more fresh fruits and vegetables and fewer canned items.  Avoid eating in fast food restaurants.    HOW TO TAKE YOUR BLOOD PRESSURE: . Rest 5 minutes before taking your blood pressure. .  Don't smoke or drink caffeinated beverages for at least 30 minutes before. . Take your blood pressure before (not after) you eat. . Sit comfortably with your back supported and both feet on the floor (don't cross your legs). . Elevate your arm to heart level on a table or a desk. . Use the proper sized cuff. It should fit smoothly and snugly around your bare upper arm. There should be enough room to slip a fingertip under the cuff. The bottom edge of the cuff should be 1 inch above the crease of the elbow. . Ideally, take 3 measurements at one sitting and record the average.

## 2020-07-20 DIAGNOSIS — I471 Supraventricular tachycardia: Secondary | ICD-10-CM | POA: Diagnosis not present

## 2020-07-20 DIAGNOSIS — E039 Hypothyroidism, unspecified: Secondary | ICD-10-CM | POA: Diagnosis not present

## 2020-07-20 DIAGNOSIS — Z299 Encounter for prophylactic measures, unspecified: Secondary | ICD-10-CM | POA: Diagnosis not present

## 2020-07-20 DIAGNOSIS — I10 Essential (primary) hypertension: Secondary | ICD-10-CM | POA: Diagnosis not present

## 2020-07-20 DIAGNOSIS — G4700 Insomnia, unspecified: Secondary | ICD-10-CM | POA: Diagnosis not present

## 2020-07-23 DIAGNOSIS — N281 Cyst of kidney, acquired: Secondary | ICD-10-CM | POA: Diagnosis not present

## 2020-07-23 DIAGNOSIS — N138 Other obstructive and reflux uropathy: Secondary | ICD-10-CM | POA: Diagnosis not present

## 2020-07-23 DIAGNOSIS — N390 Urinary tract infection, site not specified: Secondary | ICD-10-CM | POA: Diagnosis not present

## 2020-07-23 DIAGNOSIS — N401 Enlarged prostate with lower urinary tract symptoms: Secondary | ICD-10-CM | POA: Diagnosis not present

## 2020-07-26 ENCOUNTER — Telehealth: Payer: Self-pay | Admitting: Oncology

## 2020-07-26 NOTE — Telephone Encounter (Signed)
Rescheduled appointment per provider pal schedule. Called patient who is aware of appointment time change.

## 2020-08-02 DIAGNOSIS — I10 Essential (primary) hypertension: Secondary | ICD-10-CM | POA: Diagnosis not present

## 2020-08-02 DIAGNOSIS — N289 Disorder of kidney and ureter, unspecified: Secondary | ICD-10-CM | POA: Diagnosis not present

## 2020-08-03 LAB — BASIC METABOLIC PANEL
BUN/Creatinine Ratio: 21 (ref 10–24)
BUN: 28 mg/dL (ref 10–36)
CO2: 24 mmol/L (ref 20–29)
Calcium: 9.2 mg/dL (ref 8.6–10.2)
Chloride: 105 mmol/L (ref 96–106)
Creatinine, Ser: 1.33 mg/dL — ABNORMAL HIGH (ref 0.76–1.27)
GFR calc Af Amer: 54 mL/min/{1.73_m2} — ABNORMAL LOW (ref >59)
GFR calc non Af Amer: 46 mL/min/{1.73_m2} — ABNORMAL LOW (ref >59)
Glucose: 98 mg/dL (ref 65–99)
Potassium: 4.4 mmol/L (ref 3.5–5.2)
Sodium: 141 mmol/L (ref 134–144)

## 2020-08-09 ENCOUNTER — Other Ambulatory Visit: Payer: Self-pay | Admitting: Cardiovascular Disease

## 2020-08-16 DIAGNOSIS — H61001 Unspecified perichondritis of right external ear: Secondary | ICD-10-CM | POA: Diagnosis not present

## 2020-08-16 DIAGNOSIS — Z85820 Personal history of malignant melanoma of skin: Secondary | ICD-10-CM | POA: Diagnosis not present

## 2020-08-16 DIAGNOSIS — Z85828 Personal history of other malignant neoplasm of skin: Secondary | ICD-10-CM | POA: Diagnosis not present

## 2020-08-16 DIAGNOSIS — D485 Neoplasm of uncertain behavior of skin: Secondary | ICD-10-CM | POA: Diagnosis not present

## 2020-08-21 ENCOUNTER — Telehealth (INDEPENDENT_AMBULATORY_CARE_PROVIDER_SITE_OTHER): Payer: Self-pay | Admitting: *Deleted

## 2020-08-21 NOTE — Telephone Encounter (Signed)
Patient will need office visit either with me or with Ms. Laurine Blazer, PA-C

## 2020-08-21 NOTE — Telephone Encounter (Signed)
Patient left a telephone message . He states that he has been experiencing pain in his stomach. He is okay in the morning and while sleeping. As soon as he gets active and moving around , he gets a pain in his stomach , one area is where he had the Colon Surgery. The pain is a burning type pain. He would like to find out what is going on and if Dr.Rehman could get a hold of him he would appreciate it. Call back number is [phone].

## 2020-08-22 NOTE — Telephone Encounter (Signed)
Per Dr.Rehman the patient will need appointment with him or Thayer Headings for further evaluation. Mitzie please let me know availability.

## 2020-08-28 ENCOUNTER — Ambulatory Visit (INDEPENDENT_AMBULATORY_CARE_PROVIDER_SITE_OTHER): Payer: Medicare Other | Admitting: Internal Medicine

## 2020-08-28 ENCOUNTER — Other Ambulatory Visit: Payer: Self-pay

## 2020-08-28 ENCOUNTER — Ambulatory Visit: Payer: Medicare Other

## 2020-08-28 ENCOUNTER — Encounter (INDEPENDENT_AMBULATORY_CARE_PROVIDER_SITE_OTHER): Payer: Self-pay | Admitting: Internal Medicine

## 2020-08-28 DIAGNOSIS — K4090 Unilateral inguinal hernia, without obstruction or gangrene, not specified as recurrent: Secondary | ICD-10-CM | POA: Diagnosis not present

## 2020-08-28 NOTE — Progress Notes (Signed)
Presenting complaint;  Pain in left lower quadrant of abdomen.  Database and subjective:  Patient is a 86 year old Caucasian male who called last week and requested to be seen for new onset of pain in left lower quadrant of his abdomen. He he was found to have adenocarcinoma the colon at splenic flexure in April 2021 when he presented with rectal bleeding and anemia. He underwent laparoscopic assisted partial colectomy in June 2021. He opted not to proceed with adjuvant chemotherapy. He was last seen on May 15, 2020 he was doing well. His hemoglobin was coming up and so is his weight. He has an appointment with his oncologist Dr. Betsy Coder next month.  He now presents with 2-week history of pain in left groin and left lower quadrant radiating up posterolaterally. He also has noticed some bloating relieved with Phazyme which she is using on as-needed basis. Pain is sharp. He does not have pain when he lies flat or he is sitting. He has pain when he stands or he bends. He has not had any back problems. He denies numbness in his lower extremities. He is prone to constipation and has been using stool softener. He denies nausea vomiting melena or rectal bleeding. His appetite is fair. He has lost 4 pounds since his last visit. He states he and his wife do not prepare any meals at home They just buy food from outside. They do get 1 meal a day via Meals on Wheels for 5 days each week.  Current Medications: Outpatient Encounter Medications as of 08/28/2020  Medication Sig  . COMBIGAN 0.2-0.5 % ophthalmic solution Place 1 drop into both eyes 2 (two) times daily.  Marland Kitchen docusate sodium (COLACE) 100 MG capsule Take 2 capsules (200 mg total) by mouth at bedtime.  Marland Kitchen ELIQUIS 5 MG TABS tablet TAKE 1 TABLET BY MOUTH TWICE DAILY  . finasteride (PROSCAR) 5 MG tablet Take 5 mg by mouth daily.  Marland Kitchen levothyroxine (SYNTHROID, LEVOTHROID) 75 MCG tablet Take 75 mcg by mouth daily before breakfast.   . LORazepam (ATIVAN) 1  MG tablet Take 0.5 mg by mouth at bedtime as needed for sleep.   . metoprolol succinate (TOPROL-XL) 50 MG 24 hr tablet TAKE 2 TABLETS BY MOUTH TWICE A DAY (50 MG TWICE A DAY) (Patient taking differently: Take 50 mg by mouth in the morning and at bedtime. TAKE 2 TABLETS BY MOUTH TWICE A DAY (50 MG TWICE A DAY))  . Multiple Vitamins-Minerals (CENTRUM SILVER PO) Take 1 tablet by mouth daily.   Marland Kitchen spironolactone (ALDACTONE) 25 MG tablet Take 0.5 tablets (12.5 mg total) by mouth daily.  . tamsulosin (FLOMAX) 0.4 MG CAPS capsule Take 1 capsule (0.4 mg total) by mouth at bedtime.  . Vitamin D, Cholecalciferol, 50 MCG (2000 UT) CAPS Take 2,000 Units by mouth daily.   . Sulfacetamide Sodium-Sulfur 10-5 % CREA Apply topically daily. To forehead (Patient not taking: Reported on 08/28/2020)   No facility-administered encounter medications on file as of 08/28/2020.     Objective: Blood pressure (!) 152/96, pulse (!) 105, temperature 97.9 F (36.6 C), temperature source Oral, height _0  (1.727 m), weight 134 lb 8 oz (61 kg). Patient is alert and in no acute distress. He is wearing a mask. Conjunctiva is pink. Sclera is nonicteric Oropharyngeal mucosa is normal. No neck masses or thyromegaly noted. Cardiac exam with irregular rhythm normal S1 and S2. No murmur or gallop noted. Lungs are clear to auscultation. Abdomen symmetrical with laparoscopy scars across upper abdomen  and small scar in right lower quadrant. He also has scar in right inguinal region. He has a lump in left inguinal region with positive cough impulse. It is more pronounced when he stands up. No tenderness organomegaly or masses. No LE edema or clubbing noted.  Labs/studies Results:  CBC Latest Ref Rng & Units 05/15/2020 04/16/2020 04/15/2020  WBC 3.4 - 10.8 x10E3/uL 4.9 5.7 7.3  Hemoglobin 13.0 - 17.7 g/dL 11.7(L) 9.1(L) 8.9(L)  Hematocrit 37.5 - 51.0 % 35.4(L) 28.4(L) 27.5(L)  Platelets 150 - 450 x10E3/uL 161 123(L) 123(L)    CMP  Latest Ref Rng & Units 08/02/2020 04/16/2020 04/15/2020  Glucose 65 - 99 mg/dL 98 101(H) 108(H)  BUN 10 - 36 mg/dL _0 Creatinine 0.76 - 1.27 mg/dL 1.33(H) 1.14 1.10  Sodium 134 - 144 mmol/L 141 135 136  Potassium 3.5 - 5.2 mmol/L 4.4 3.4(L) 4.0  Chloride 96 - 106 mmol/L 105 105 107  CO2 20 - 29 mmol/L _1 Calcium 8.6 - 10.2 mg/dL 9.2 8.1(L) 7.9(L)  Total Protein 6.5 - 8.1 g/dL - - -  Total Bilirubin 0.3 - 1.2 mg/dL - - -  Alkaline Phos 38 - 126 U/L - - -  AST 15 - 41 U/L - - -  ALT 0 - 44 U/L - - -    Hepatic Function Latest Ref Rng & Units 04/10/2020 11/10/2018 08/03/2018  Total Protein 6.5 - 8.1 g/dL 6.7 5.8(L) 5.6(L)  Albumin 3.5 - 5.0 g/dL 3.8 3.8 3.5  AST 15 - 41 U/L 30 29 32  ALT 0 - 44 U/L _2 Alk Phosphatase 38 - 126 U/L 75 89 87  Total Bilirubin 0.3 - 1.2 mg/dL 1.6(H) 0.8 0.7  Bilirubin, Direct 0.00 - 0.40 mg/dL - - 0.22     Assessment:  #1. Left lower quadrant abdominal pain appears to be due to left inguinal hernia. Hernia is completely reducible in supine position. He remains very active and I would recommend he should proceed with surgery. He would like to have surgery at [facility].  #2. History of colon carcinoma. He is status post laparoscopic partial colectomy in June 2021. He is due for evaluation by his oncologist Dr. Betsy Coder next month.   Plan:  Surgical consultation with Dr. Blake Divine. In the meantime if the patient has painful lump and it does not resolve in supine position he should immediately report to emergency room. He will return for office visit in 6 months.

## 2020-08-28 NOTE — Patient Instructions (Addendum)
Pain in left inguinal area not relieved by lying flat please report to emergency room. Office visit with Dr. Blake Divine be scheduled.

## 2020-08-30 ENCOUNTER — Ambulatory Visit (INDEPENDENT_AMBULATORY_CARE_PROVIDER_SITE_OTHER): Payer: Medicare Other | Admitting: Gastroenterology

## 2020-09-03 ENCOUNTER — Telehealth (INDEPENDENT_AMBULATORY_CARE_PROVIDER_SITE_OTHER): Payer: Self-pay

## 2020-09-03 NOTE — Telephone Encounter (Signed)
Dylan York is calling stating that he is supposed to get his covid booster Wednesday this week and he is asking Dr Otelia Limes opinion if he should get it or not, please advise?

## 2020-09-03 NOTE — Telephone Encounter (Signed)
Per Dr.Rehman - he feels that the patient should get the booster vaccine for the COVID, he ask that the patient also follow up with his PCP for their recommendation as well.

## 2020-09-05 DIAGNOSIS — Z23 Encounter for immunization: Secondary | ICD-10-CM | POA: Diagnosis not present

## 2020-09-06 NOTE — Telephone Encounter (Signed)
Mr Dennin got the [employer]

## 2020-09-11 ENCOUNTER — Other Ambulatory Visit: Payer: Self-pay

## 2020-09-11 ENCOUNTER — Telehealth: Payer: Self-pay

## 2020-09-11 ENCOUNTER — Inpatient Hospital Stay: Payer: Medicare Other | Attending: Oncology

## 2020-09-11 ENCOUNTER — Inpatient Hospital Stay (HOSPITAL_BASED_OUTPATIENT_CLINIC_OR_DEPARTMENT_OTHER): Payer: Medicare Other | Admitting: Oncology

## 2020-09-11 VITALS — BP 137/95 | HR 91 | Temp 97.5°F | Resp 17 | Ht 68.0 in | Wt 134.8 lb

## 2020-09-11 DIAGNOSIS — K625 Hemorrhage of anus and rectum: Secondary | ICD-10-CM | POA: Insufficient documentation

## 2020-09-11 DIAGNOSIS — N400 Enlarged prostate without lower urinary tract symptoms: Secondary | ICD-10-CM | POA: Insufficient documentation

## 2020-09-11 DIAGNOSIS — Z9049 Acquired absence of other specified parts of digestive tract: Secondary | ICD-10-CM | POA: Diagnosis not present

## 2020-09-11 DIAGNOSIS — H409 Unspecified glaucoma: Secondary | ICD-10-CM | POA: Insufficient documentation

## 2020-09-11 DIAGNOSIS — C184 Malignant neoplasm of transverse colon: Secondary | ICD-10-CM

## 2020-09-11 DIAGNOSIS — E039 Hypothyroidism, unspecified: Secondary | ICD-10-CM | POA: Diagnosis not present

## 2020-09-11 DIAGNOSIS — K4090 Unilateral inguinal hernia, without obstruction or gangrene, not specified as recurrent: Secondary | ICD-10-CM | POA: Diagnosis not present

## 2020-09-11 DIAGNOSIS — Z8673 Personal history of transient ischemic attack (TIA), and cerebral infarction without residual deficits: Secondary | ICD-10-CM | POA: Diagnosis not present

## 2020-09-11 DIAGNOSIS — I4891 Unspecified atrial fibrillation: Secondary | ICD-10-CM | POA: Insufficient documentation

## 2020-09-11 DIAGNOSIS — I10 Essential (primary) hypertension: Secondary | ICD-10-CM | POA: Diagnosis not present

## 2020-09-11 DIAGNOSIS — C787 Secondary malignant neoplasm of liver and intrahepatic bile duct: Secondary | ICD-10-CM | POA: Diagnosis not present

## 2020-09-11 DIAGNOSIS — D500 Iron deficiency anemia secondary to blood loss (chronic): Secondary | ICD-10-CM | POA: Insufficient documentation

## 2020-09-11 LAB — BASIC METABOLIC PANEL - CANCER CENTER ONLY
Anion gap: 10 (ref 5–15)
BUN: 24 mg/dL — ABNORMAL HIGH (ref 8–23)
CO2: 23 mmol/L (ref 22–32)
Calcium: 8.8 mg/dL — ABNORMAL LOW (ref 8.9–10.3)
Chloride: 104 mmol/L (ref 98–111)
Creatinine: 1.17 mg/dL (ref 0.61–1.24)
GFR, Estimated: 59 mL/min — ABNORMAL LOW (ref >60)
Glucose, Bld: 99 mg/dL (ref 70–99)
Potassium: 4.7 mmol/L (ref 3.5–5.1)
Sodium: 137 mmol/L (ref 135–145)

## 2020-09-11 LAB — CEA (IN HOUSE-CHCC): CEA (CHCC-In House): 135.86 ng/mL — ABNORMAL HIGH (ref 0.00–5.00)

## 2020-09-11 NOTE — Progress Notes (Signed)
  [place] OFFICE PROGRESS NOTE   Diagnosis: Colon cancer  INTERVAL HISTORY:   Dylan York returns for a scheduled visit.  He has developed a "hernia "over the past month.  The hernia is painful.  He saw Dr. Laural Golden and has been referred to Dr. Constance Haw.  No other complaint.  Objective:  Vital signs in last 24 hours:  Blood pressure (!) 137/95, pulse 91, temperature (!) 97.5 F (36.4 C), temperature source Tympanic, resp. rate 17, height $RemoveBe'5\' 8"'gsnjgbqkw$  (1.727 m), weight 134 lb 12.8 oz (61.1 kg), SpO2 98 %.    Lymphatics: No cervical, supraclavicular, axillary, or inguinal nodes Resp: Lungs clear bilaterally Cardio: Irregular, 2/6 systolic murmur GI: Nodular liver edge at the right upper lateral abdomen, no splenomegaly, nontender, reducible left inguinal hernia Vascular: No leg edema    Lab Results:  Lab Results  Component Value Date   WBC 4.9 05/15/2020   HGB 11.7 (L) 05/15/2020   HCT 35.4 (L) 05/15/2020   MCV 95 05/15/2020   PLT 161 05/15/2020   NEUTROABS 4.3 04/14/2020    CMP  Lab Results  Component Value Date   NA 141 08/02/2020   K 4.4 08/02/2020   CL 105 08/02/2020   CO2 24 08/02/2020   GLUCOSE 98 08/02/2020   BUN 28 08/02/2020   CREATININE 1.33 (H) 08/02/2020   CALCIUM 9.2 08/02/2020   PROT 6.7 04/10/2020   ALBUMIN 3.8 04/10/2020   AST 30 04/10/2020   ALT 21 04/10/2020   ALKPHOS 75 04/10/2020   BILITOT 1.6 (H) 04/10/2020   GFRNONAA 46 (L) 08/02/2020   GFRAA 54 (L) 08/02/2020    Lab Results  Component Value Date   CEA1 2.6 04/10/2020     Medications: I have reviewed the patient's current medications.   Assessment/Plan: 1. Colon cancer-transverse, stage IIIb (T3N1), status post a segmental colectomy 04/13/2020 ? 1/10 lymph nodes, lymphovascular invasion present, MSS, resection margins negative for invasive cancer, "closest" margin positive for low-grade dysplasia ? Colonoscopy 02/09/2020-ulcerated nonobstructing mass at the splenic flexure, 4  mm polyp of the rectum, splenic flexure lesion-adenocarcinoma with intact mismatch repair protein expression, tubular adenoma at the rectal polyp ? CT abdomen/pelvis 02/16/2020-apple core lesion at the distal transverse colon, no evidence of metastatic disease ? CT chest 03/06/2020-no evidence of metastatic disease, stable benign lung nodules 2. Rectal bleeding secondary to #1 3. Anemia-likely secondary to GI bleeding 4. Atrial fibrillation, maintained on apixaban 5. BPH 6. Glaucoma 7. Hypertension 8. Hypothyroid 9. History of a CVA 10. Positive resection margin for low-grade dysplasia on the colectomy specimen 04/13/2020    Disposition: Dylan York was diagnosed with stage III colon cancer in June.  He did not receive adjuvant therapy.  He appears to have a left inguinal hernia.  He will see Dr. Constance Haw later this week.  I am concerned there is hepatomegaly on exam today.  He will be referred for a CT abdomen/pelvis this week to look for evidence of metastatic disease to the liver and evaluate the left inguinal hernia.  Dylan York will return for a scheduled visit in 4 months.  We will see him sooner as indicated based on the CT result.  Betsy Coder, MD  09/11/2020  10:26 AM

## 2020-09-11 NOTE — Telephone Encounter (Signed)
-----   Message from Ladell Pier, MD sent at 09/11/2020  2:19 PM EST ----- Please call patient, the CEA returned elevated, proceed with the CT as ordered ASAP, the elevated CEA and palpable liver could indicate recurrent colon cancer, schedule office visit this Friday if the CT can be performed prior to Friday, office visit can also be 11 AM on 09/17/2020

## 2020-09-11 NOTE — Telephone Encounter (Signed)
Called spoke with pt made ct appt for earliest pt inquired about [place] no appt available before next Friday pt scheduled at [facility] long but instructed to go to [place] for contrast as this is closer to his home

## 2020-09-12 ENCOUNTER — Telehealth: Payer: Self-pay | Admitting: Oncology

## 2020-09-12 NOTE — Telephone Encounter (Signed)
Scheduled appointment per 11/9 los. Spoke to patient who is aware of appointment date and time.

## 2020-09-13 ENCOUNTER — Other Ambulatory Visit: Payer: Self-pay

## 2020-09-13 ENCOUNTER — Other Ambulatory Visit: Payer: Medicare Other

## 2020-09-13 ENCOUNTER — Ambulatory Visit: Payer: Medicare Other | Admitting: Nurse Practitioner

## 2020-09-13 ENCOUNTER — Encounter: Payer: Self-pay | Admitting: General Surgery

## 2020-09-13 ENCOUNTER — Ambulatory Visit (INDEPENDENT_AMBULATORY_CARE_PROVIDER_SITE_OTHER): Payer: Medicare Other | Admitting: General Surgery

## 2020-09-13 ENCOUNTER — Ambulatory Visit: Payer: Medicare Other | Admitting: Oncology

## 2020-09-13 VITALS — BP 131/89 | HR 48 | Temp 98.4°F | Resp 18 | Ht 68.0 in | Wt 133.0 lb

## 2020-09-13 DIAGNOSIS — K4090 Unilateral inguinal hernia, without obstruction or gangrene, not specified as recurrent: Secondary | ICD-10-CM

## 2020-09-13 NOTE — Progress Notes (Signed)
[name] History and Physical  Reason for Referral: Left hernia  Referring Physician:  Dr. Laural Golden  Chief Complaint    New Patient (Initial Visit); Hernia      Dylan York is a 86 y.o. male.  HPI: Dylan York is a 86 yo with Stage IIIb transverse colon cancer s/p robotic assisted partial colectomy and splenic flexure takedown 04/2020. The patient had been having some lower left abdominal pain and has been having some constipation. He was seen by Dr. Laural Golden how appreciated a left sided inguinal hernia after examining the patient. On his preoperative CT he did have a fat containing hernia. Aside from the left lower abdominal / possibly groin pain the patient says he has lost about 30 lbs in the last several months. He was seen by his Oncologist this past week too and was felt to have some hepatomegaly.  The patient did not receive any adjuvant therapy for his cancer. He says that he has had prior hernia surgeries in the past with a right inguinal hernia and what sounds like an umbilical hernia repair. He takes Eliquis for A fib.    Due to the hepatomegaly and hernia a CT is being ordered by Dr. Learta Codding, Oncology.  This is scheduled for next week.   Past Medical History:  Diagnosis Date  . Atrial fibrillation ([place])   . Atrial tachycardia ([place])   . Biatrial enlargement    severe biatrial enlargement  . BPH (benign prostatic hyperplasia)   . Colon cancer ([place])   . Glaucoma   . Graded compression stocking in place   . Hypertension   . Hypothyroidism   . Neutropenia ([place])   . Rosacea   . Sinus bradycardia, with HR in the 40s BB stopped. 08/08/2013  . SSS (sick sinus syndrome) ([place])   . Stroke [facility])    no residual  . Thyroid disease   . Tricuspid regurgitation     Past Surgical History:  Procedure Laterality Date  . APPENDECTOMY    . BACTERIAL OVERGROWTH TEST N/A 01/14/2016   Procedure: BACTERIAL OVERGROWTH TEST;  Surgeon: Rogene Houston, MD;  Location[facility];  Service: Endoscopy;  Laterality: N/A;  730  . BIOPSY  02/09/2020   Procedure: BIOPSY;  Surgeon: Rogene Houston, MD;  Location[facility];  Service: Endoscopy;;  . COLONOSCOPY N/A 02/09/2020   Procedure: COLONOSCOPY;  Surgeon: Rogene Houston, MD;  Location: [facility];  Service: Endoscopy;  Laterality: N/A;  1225  . HERNIA REPAIR    . INGUINAL HERNIA REPAIR Right   . POLYPECTOMY  02/09/2020   Procedure: POLYPECTOMY;  Surgeon: Rogene Houston, MD;  Location: [facility];  Service: Endoscopy;;  . TRANSURETHRAL RESECTION OF PROSTATE    . UMBILICAL HERNIA REPAIR      Family History  Problem Relation Age of Onset  . Cancer Mother   . Heart Problems Father        atherosclerosis  . Cancer - Prostate Paternal Grandfather     Social History   Tobacco Use  . Smoking status: Former Smoker    Quit date: 08/1977    Years since quitting: 43.1  . Smokeless tobacco: Never Used  Vaping Use  . Vaping Use: Never used  Substance Use Topics  . Alcohol use: No    Alcohol/week: 0.0 standard drinks  . Drug use: No    Medications: I have reviewed the patient's current medications. Allergies as of 09/13/2020  Reactions   Amiodarone Other (See Comments)   Not good for liver   Multaq [dronedarone] Shortness Of Breath      Medication List       Accurate as of September 13, 2020  1:33 PM. If you have any questions, ask your nurse or doctor.        CENTRUM SILVER PO Take 1 tablet by mouth daily.   Combigan 0.2-0.5 % ophthalmic solution Generic drug: brimonidine-timolol Place 1 drop into both eyes 2 (two) times daily.   docusate sodium 100 MG capsule Commonly known as: Colace Take 2 capsules (200 mg total) by mouth at bedtime.   Eliquis 5 MG Tabs tablet Generic drug: apixaban TAKE 1 TABLET BY MOUTH TWICE DAILY   finasteride 5 MG tablet Commonly known as: PROSCAR Take 5 mg by mouth daily.   levothyroxine 75 MCG tablet Commonly known as: SYNTHROID Take 75 mcg  by mouth daily before breakfast.   LORazepam 1 MG tablet Commonly known as: ATIVAN Take 0.5 mg by mouth at bedtime as needed for sleep.   metoprolol succinate 50 MG 24 hr tablet Commonly known as: TOPROL-XL TAKE 2 TABLETS BY MOUTH TWICE A DAY (50 MG TWICE A DAY) What changed:   how much to take  how to take this  when to take this   spironolactone 25 MG tablet Commonly known as: ALDACTONE Take 0.5 tablets (12.5 mg total) by mouth daily.   Sulfacetamide Sodium-Sulfur 10-5 % Crea Apply topically daily. To forehead   tamsulosin 0.4 MG Caps capsule Commonly known as: FLOMAX Take 1 capsule (0.4 mg total) by mouth at bedtime.   vitamin D3 50 MCG (2000 UT) Caps Take 2,000 Units by mouth daily.        ROS:  A comprehensive review of systems was negative except for: Respiratory: positive for SOB Gastrointestinal: positive for abdominal pain and constipation  Blood pressure 131/89, pulse (!) 48, temperature 98.4 F (36.9 C), temperature source Oral, resp. rate 18, height 5\' 8"  (1.727 m), weight 133 lb (60.3 kg), SpO2 96 %. Physical Exam Vitals reviewed.  HENT:     Head: Normocephalic.     Nose: Nose normal.     Mouth/Throat:     Mouth: Mucous membranes are moist.  Eyes:     Extraocular Movements: Extraocular movements intact.  Cardiovascular:     Rate and Rhythm: Normal rate.  Pulmonary:     Effort: Pulmonary effort is normal.  Abdominal:     General: There is no distension.     Palpations: Abdomen is soft.     Comments: Left inguinal hernia, mildly tender, reducible  Musculoskeletal:        General: Normal range of motion.     Cervical back: Normal range of motion.  Skin:    General: Skin is warm and dry.  Neurological:     General: No focal deficit present.     Mental Status: He is alert and oriented to person, place, and time.  Psychiatric:        Mood and Affect: Mood normal.        Behavior: Behavior normal.        Thought Content: Thought content  normal.        Judgment: Judgment normal.     Results: CT a/p 02/2020- left inguinal hernia with fat but no bowel   Assessment & Plan:  Dylan York is a 86 y.o. male with some left lower quadrant/ groin pain and a hernia on that  side. He is on Eliquis for A fib and has just had surgery for colon cancer. He is getting a CT next week to further evaluate things given concern for hepatomegaly. His CEA is up from his prior at 135 from normal.    I am concerned he may have metastatic disease causing the constipation and thus causing the LLQ pain and likely causing the hernia to be worse.    He has his CT and then we will see to further discuss the options/ plan  All questions were answered to the satisfaction of the patient and family.  Future Appointments  Date Time Provider [place]  09/14/2020  3:00 PM [facility] PHARMACIST [facility]  09/20/2020 11:00 AM WL-CT 1 WL-CT [place]  09/21/2020 10:15 AM Owens Shark, NP [facility] None  09/25/2020  9:30 AM Virl Cagey, MD RS-RS None  01/10/2021  2:30 PM [facility] LAB [facility] None  01/10/2021  3:00 PM Ladell Pier, MD [facility] None  02/26/2021 11:00 AM Rehman, Mechele Dawley, MD NRE-NRE None       Virl Cagey 09/13/2020, 1:33 PM

## 2020-09-13 NOTE — Patient Instructions (Signed)
Inguinal Hernia, Adult An inguinal hernia develops when fat or the intestines push through a weak spot in a muscle where your leg meets your lower abdomen (groin). This creates a bulge. This kind of hernia could also be:  In your scrotum, if you are male.  In folds of skin around your vagina, if you are male. There are three types of inguinal hernias:  Hernias that can be pushed back into the abdomen (are reducible). This type rarely causes pain.  Hernias that are not reducible (are incarcerated).  Hernias that are not reducible and lose their blood supply (are strangulated). This type of hernia requires emergency surgery. What are the causes? This condition is caused by having a weak spot in the muscles or tissues in the groin. This weak spot develops over time. The hernia may poke through the weak spot when you suddenly strain your lower abdominal muscles, such as when you:  Lift a heavy object.  Strain to have a bowel movement. Constipation can lead to straining.  Cough. What increases the risk? This condition is more likely to develop in:  Men.  Pregnant women.  People who: ? Are overweight. ? Work in jobs that require long periods of standing or heavy lifting. ? Have had an inguinal hernia before. ? Smoke or have lung disease. These factors can lead to long-lasting (chronic) coughing. What are the signs or symptoms? Symptoms may depend on the size of the hernia. Often, a small inguinal hernia has no symptoms. Symptoms of a larger hernia may include:  A lump in the groin area. This is easier to see when standing. It might not be visible when lying down.  Pain or burning in the groin. This may get worse when lifting, straining, or coughing.  A dull ache or a feeling of pressure in the groin.  In men, an unusual lump in the scrotum. Symptoms of a strangulated inguinal hernia may include:  A bulge in your groin that is very painful and tender to the touch.  A bulge  that turns red or purple.  Fever, nausea, and vomiting.  Inability to have a bowel movement or to pass gas. How is this diagnosed? This condition is diagnosed based on your symptoms, your medical history, and a physical exam. Your health care provider may feel your groin area and ask you to cough. How is this treated? Treatment depends on the size of your hernia and whether you have symptoms. If you do not have symptoms, your health care provider may have you watch your hernia carefully and have you come in for follow-up visits. If your hernia is large or if you have symptoms, you may need surgery to repair the hernia. Follow these instructions at home: Lifestyle  Avoid lifting heavy objects.  Avoid standing for long periods of time.  Do not use any products that contain nicotine or tobacco, such as cigarettes and e-cigarettes. If you need help quitting, ask your health care provider.  Maintain a healthy weight. Preventing constipation  Take actions to prevent constipation. Constipation leads to straining with bowel movements, which can make a hernia worse or cause a hernia repair to break down. Your health care provider may recommend that you: ? Drink enough fluid to keep your urine pale yellow. ? Eat foods that are high in fiber, such as fresh fruits and vegetables, whole grains, and beans. ? Limit foods that are high in fat and processed sugars, such as fried or sweet foods. ? Take an over-the-counter   or prescription medicine for constipation. General instructions  You may try to push the hernia back in place by very gently pressing on it while lying down. Do not try to force the bulge back in if it will not push in easily.  Watch your hernia for any changes in shape, size, or color. Get help right away if you notice any changes.  Take over-the-counter and prescription medicines only as told by your health care provider.  Keep all follow-up visits as told by your health care  provider. This is important. Contact a health care provider if:  You have a fever.  You develop new symptoms.  Your symptoms get worse. Get help right away if:  You have pain in your groin that suddenly gets worse.  You have a bulge in your groin that: ? Suddenly gets bigger and does not get smaller. ? Becomes red or purple or painful to the touch.  You are a man and you have a sudden pain in your scrotum, or the size of your scrotum suddenly changes.  You cannot push the hernia back in place by very gently pressing on it when you are lying down. Do not try to force the bulge back in if it will not push in easily.  You have nausea or vomiting that does not go away.  You have a fast heartbeat.  You cannot have a bowel movement or pass gas. These symptoms may represent a serious problem that is an emergency. Do not wait to see if the symptoms will go away. Get medical help right away. Call your local emergency services (911 in the [place] Summary  An inguinal hernia develops when fat or the intestines push through a weak spot in a muscle where your leg meets your lower abdomen (groin).  This condition is caused by having a weak spot in muscles or tissue in your groin.  Symptoms may depend on the size of the hernia, and they may include pain or swelling in your groin. A small inguinal hernia often has no symptoms.  Treatment may not be needed if you do not have symptoms. If you have symptoms or a large hernia, you may need surgery to repair the hernia.  Avoid lifting heavy objects. Also avoid standing for long amounts of time. This information is not intended to replace advice given to you by your health care provider. Make sure you discuss any questions you have with your health care provider. Document Revised: 11/21/2017 Document Reviewed: 07/22/2017 Elsevier Patient Education  [place] Repair, Adult  Open hernia repair is a surgical procedure to fix  a hernia. A hernia occurs when an internal organ or tissue pushes out through a weak spot in the abdominal wall muscles. Hernias commonly occur in the groin and around the navel. Most hernias tend to get worse over time. Often, surgery is done to prevent the hernia from becoming bigger, uncomfortable, or an emergency. Emergency surgery may be needed if abdominal contents get stuck in the opening (incarcerated hernia) or the blood supply gets cut off (strangulated hernia). In an open repair, an incision is made in the abdomen to perform the surgery. Tell a health care provider about:  Any allergies you have.  All medicines you are taking, including vitamins, herbs, eye drops, creams, and over-the-counter medicines.  Any problems you or family members have had with anesthetic medicines.  Any blood or bone disorders you have.  Any surgeries you have had.  Any  medical conditions you have, including any recent cold or flu symptoms.  Whether you are pregnant or may be pregnant. What are the risks? Generally, this is a safe procedure. However, problems may occur, including:  Long-lasting (chronic) pain.  Bleeding.  Infection.  Damage to the testicle. This can cause shrinking or swelling.  Damage to the bladder, blood vessels, intestine, or nerves near the hernia.  Trouble passing urine.  Allergic reactions to medicines.  Return of the hernia. Medicines  Ask your health care provider about: ? Changing or stopping your regular medicines. This is especially important if you are taking diabetes medicines or blood thinners. ? Taking medicines such as aspirin and ibuprofen. These medicines can thin your blood. Do not take these medicines before your procedure if your health care provider instructs you not to.  You may be given antibiotic medicine to help prevent infection. General instructions  You may have blood tests or imaging studies.  Ask your health care provider how your  surgical site will be marked or identified.  If you smoke, do not smoke for at least 2 weeks before your procedure or for as long as told by your health care provider.  Let your health care provider know if you develop a cold or any infection before your surgery.  Plan to have someone take you home from the hospital or clinic.  If you will be going home right after the procedure, plan to have someone with you for 24 hours. What happens during the procedure?  To reduce your risk of infection: ? Your health care team will wash or sanitize their hands. ? Your skin will be washed with soap. ? Hair may be removed from the surgical area.  An IV tube will be inserted into one of your veins.  You will be given one or more of the following: ? A medicine to help you relax (sedative). ? A medicine to numb the area (local anesthetic). ? A medicine to make you fall asleep (general anesthetic).  Your surgeon will make an incision over the hernia.  The tissues of the hernia will be moved back into place.  The edges of the hernia may be stitched together.  The opening in the abdominal muscles will be closed with stitches (sutures). Or, your surgeon will place a mesh patch made of manmade (synthetic) material over the opening.  The incision will be closed.  A bandage (dressing) may be placed over the incision. The procedure may vary among health care providers and hospitals. What happens after the procedure?  Your blood pressure, heart rate, breathing rate, and blood oxygen level will be monitored until the medicines you were given have worn off.  You may be given medicine for pain.  Do not drive for 24 hours if you received a sedative. This information is not intended to replace advice given to you by your health care provider. Make sure you discuss any questions you have with your health care provider. Document Revised: 10/02/2017 Document Reviewed: 04/02/2016 Elsevier Patient Education   2020 [employer].

## 2020-09-14 ENCOUNTER — Ambulatory Visit: Payer: Medicare Other

## 2020-09-14 NOTE — Progress Notes (Deleted)
09/14/2020 [name] 1926-09-05 [id number]   HPI:  Dylan York is a 86 y.o. male patient of Dr Claiborne Billings, with a PMH below who presents today for hypertension clinic evaluation.  He had been seen in [facility] several times in the past, last in 2017.  At that time he was mostly well controlled, although he did have both hyper- and hypo- tensive episodes.  Since then he has managed to cut back on BP medications and currently takes only metoprolol succ 50 mg bid.  He was recently diagnosed with colon cancer and had segmental colectomy.  He notes that 10 lymph nodes were removed and 1 was found to have metastatic cancer.  He has chosen not to go thru chemotherapy at his stage in life.  At his last visit he asked about re-starting spironolactone, as he thought it was the best at keeping his pressure controlled.  We did re-start at 12.5 mg daily He has a history of labile pressures, and with his advanced age and cancer diagnosis, we chose to be less aggressive in getting his pressure down.  Labs after the addition of spironolactone did show an increase in SCr of just under 20% and increase in potassium from 3.4 to 4.4.    Today he returns for follow up.    Past Medical History: Heart failure Chronic diastolic  ASCVD CVA due to embolism 2017  hyperlipidemia 10/2019:  TC 134, TG 86, HDL 51, LDL 66 - no medications  AF Persistent -  on Eliquis, metoprolol  DM2 6/21 A1c 6.4 - diet controlled  hypothyroidism 10/2019 TSH 0.5, on levothyroixine 75 mcg     Blood Pressure Goal:  130/80  Current Medications: metoprolol succ 50 mg bid, spironolactone 12.5 mg qd  Social Hx: former smoker (quit ~45 yrs ago), no alcohol, small amts of caffeine (half caf coffee)  Diet: eating out - Meals on Wheels 5 days per week; no added salt, mostly avoids deep fried foods;   Exercise: no regular exercise; previously owned tree farm  Home BP readings: mostly 627-035'K systolic; diastolic 09-381  Intolerances:  amiodarone, Multaq  Labs: 6/21:  Na 135, K 3.4, Glu 101, BUN 21, SCr 1.14, GFR 56  Wt Readings from Last 3 Encounters:  09/13/20 133 lb (60.3 kg)  09/11/20 134 lb 12.8 oz (61.1 kg)  08/28/20 134 lb 8 oz (61 kg)   BP Readings from Last 3 Encounters:  09/13/20 131/89  09/11/20 (!) 137/95  08/28/20 (!) 152/96   Pulse Readings from Last 3 Encounters:  09/13/20 (!) 48  09/11/20 91  08/28/20 (!) 105    Current Outpatient Medications  Medication Sig Dispense Refill  . COMBIGAN 0.2-0.5 % ophthalmic solution Place 1 drop into both eyes 2 (two) times daily.    Marland Kitchen docusate sodium (COLACE) 100 MG capsule Take 2 capsules (200 mg total) by mouth at bedtime. 60 capsule 0  . ELIQUIS 5 MG TABS tablet TAKE 1 TABLET BY MOUTH TWICE DAILY 180 tablet 1  . finasteride (PROSCAR) 5 MG tablet Take 5 mg by mouth daily.    Marland Kitchen levothyroxine (SYNTHROID, LEVOTHROID) 75 MCG tablet Take 75 mcg by mouth daily before breakfast.     . LORazepam (ATIVAN) 1 MG tablet Take 0.5 mg by mouth at bedtime as needed for sleep.   2  . metoprolol succinate (TOPROL-XL) 50 MG 24 hr tablet TAKE 2 TABLETS BY MOUTH TWICE A DAY (50 MG TWICE A DAY) (Patient taking differently: Take 50 mg by  mouth in the morning and at bedtime. TAKE 2 TABLETS BY MOUTH TWICE A DAY (50 MG TWICE A DAY)) 180 tablet 3  . Multiple Vitamins-Minerals (CENTRUM SILVER PO) Take 1 tablet by mouth daily.     Marland Kitchen spironolactone (ALDACTONE) 25 MG tablet Take 0.5 tablets (12.5 mg total) by mouth daily. 45 tablet 3  . Sulfacetamide Sodium-Sulfur 10-5 % CREA Apply topically daily. To forehead (Patient not taking: Reported on 09/11/2020)    . tamsulosin (FLOMAX) 0.4 MG CAPS capsule Take 1 capsule (0.4 mg total) by mouth at bedtime. 90 capsule 3  . Vitamin D, Cholecalciferol, 50 MCG (2000 UT) CAPS Take 2,000 Units by mouth daily.      No current facility-administered medications for this visit.    Allergies  Allergen Reactions  . Amiodarone Other (See Comments)    Not  good for liver  . Multaq [Dronedarone] Shortness Of Breath    Past Medical History:  Diagnosis Date  . Atrial fibrillation ([place])   . Atrial tachycardia ([place])   . Biatrial enlargement    severe biatrial enlargement  . BPH (benign prostatic hyperplasia)   . Colon cancer ([place])   . Glaucoma   . Graded compression stocking in place   . Hypertension   . Hypothyroidism   . Neutropenia ([place])   . Rosacea   . Sinus bradycardia, with HR in the 40s BB stopped. 08/08/2013  . SSS (sick sinus syndrome) ([place])   . Stroke [facility])    no residual  . Thyroid disease   . Tricuspid regurgitation     There were no vitals taken for this visit.  No problem-specific Assessment & Plan notes found for this encounter.   Tommy Medal PharmD CPP [facility] [address] [place], [place] [address] [phone]

## 2020-09-20 ENCOUNTER — Other Ambulatory Visit: Payer: Self-pay

## 2020-09-20 ENCOUNTER — Ambulatory Visit (HOSPITAL_COMMUNITY)
Admission: RE | Admit: 2020-09-20 | Discharge: 2020-09-20 | Disposition: A | Discharge status: Home / Self Care | Payer: Medicare Other | Source: Ambulatory Visit | Attending: Oncology | Admitting: Oncology

## 2020-09-20 ENCOUNTER — Encounter (HOSPITAL_COMMUNITY): Payer: Self-pay

## 2020-09-20 DIAGNOSIS — C787 Secondary malignant neoplasm of liver and intrahepatic bile duct: Secondary | ICD-10-CM | POA: Diagnosis not present

## 2020-09-20 DIAGNOSIS — I358 Other nonrheumatic aortic valve disorders: Secondary | ICD-10-CM | POA: Diagnosis not present

## 2020-09-20 DIAGNOSIS — K4090 Unilateral inguinal hernia, without obstruction or gangrene, not specified as recurrent: Secondary | ICD-10-CM | POA: Diagnosis not present

## 2020-09-20 DIAGNOSIS — Z85038 Personal history of other malignant neoplasm of large intestine: Secondary | ICD-10-CM | POA: Diagnosis not present

## 2020-09-20 DIAGNOSIS — C184 Malignant neoplasm of transverse colon: Secondary | ICD-10-CM | POA: Diagnosis not present

## 2020-09-20 HISTORY — DX: Type 2 diabetes mellitus without complications: E11.9

## 2020-09-20 MED ORDER — IOHEXOL 300 MG/ML  SOLN
100.0000 mL | Freq: Once | INTRAMUSCULAR | Status: AC | PRN
  Administered 2020-09-20: 100 mL via INTRAVENOUS

## 2020-09-21 ENCOUNTER — Other Ambulatory Visit: Payer: Self-pay

## 2020-09-21 ENCOUNTER — Inpatient Hospital Stay (HOSPITAL_BASED_OUTPATIENT_CLINIC_OR_DEPARTMENT_OTHER): Payer: Medicare Other | Admitting: Nurse Practitioner

## 2020-09-21 ENCOUNTER — Encounter: Payer: Self-pay | Admitting: Nurse Practitioner

## 2020-09-21 VITALS — BP 126/88 | HR 87 | Temp 97.4°F | Resp 16 | Ht 68.0 in | Wt 132.2 lb

## 2020-09-21 DIAGNOSIS — C184 Malignant neoplasm of transverse colon: Secondary | ICD-10-CM

## 2020-09-21 DIAGNOSIS — K4090 Unilateral inguinal hernia, without obstruction or gangrene, not specified as recurrent: Secondary | ICD-10-CM | POA: Diagnosis not present

## 2020-09-21 DIAGNOSIS — C189 Malignant neoplasm of colon, unspecified: Secondary | ICD-10-CM | POA: Diagnosis not present

## 2020-09-21 DIAGNOSIS — H409 Unspecified glaucoma: Secondary | ICD-10-CM | POA: Diagnosis not present

## 2020-09-21 DIAGNOSIS — E039 Hypothyroidism, unspecified: Secondary | ICD-10-CM | POA: Diagnosis not present

## 2020-09-21 DIAGNOSIS — C787 Secondary malignant neoplasm of liver and intrahepatic bile duct: Secondary | ICD-10-CM | POA: Diagnosis not present

## 2020-09-21 DIAGNOSIS — I10 Essential (primary) hypertension: Secondary | ICD-10-CM | POA: Diagnosis not present

## 2020-09-21 MED ORDER — PROCHLORPERAZINE MALEATE 5 MG PO TABS: 5.0000 mg | ORAL_TABLET | Freq: Four times a day (QID) | ORAL | 2 refills | Status: AC | PRN

## 2020-09-21 MED ORDER — LIDOCAINE-PRILOCAINE 2.5-2.5 % EX CREA: TOPICAL_CREAM | CUTANEOUS | 2 refills | Status: AC

## 2020-09-21 NOTE — Progress Notes (Addendum)
[place] OFFICE PROGRESS NOTE   Diagnosis: Colon cancer  INTERVAL HISTORY:   Dylan York returns as scheduled.  He continues to have pain at the left lower abdomen.  He attributes the pain to the hernia.  He does not have pain if he lays flat.  Appetite varies.  He has lost some weight.  Objective:  Vital signs in last 24 hours:  Blood pressure 126/88, pulse 87, temperature (!) 97.4 F (36.3 C), temperature source Tympanic, resp. rate 16, height $RemoveBe'5\' 8"'zrJbCJXpp$  (1.727 m), weight 132 lb 3.2 oz (60 kg), SpO2 100 %.    GI: Liver palpable right abdomen. Vascular: No leg edema. Neuro: Alert and oriented.   Lab Results:  Lab Results  Component Value Date   WBC 4.9 05/15/2020   HGB 11.7 (L) 05/15/2020   HCT 35.4 (L) 05/15/2020   MCV 95 05/15/2020   PLT 161 05/15/2020   NEUTROABS 4.3 04/14/2020    Imaging:  CT ABDOMEN PELVIS W CONTRAST  Result Date: 09/21/2020 CLINICAL DATA:  86 year old male with history of colon cancer. Evaluate for liver metastases. EXAM: CT ABDOMEN AND PELVIS WITH CONTRAST TECHNIQUE: Multidetector CT imaging of the abdomen and pelvis was performed using the standard protocol following bolus administration of intravenous contrast. CONTRAST:  162mL OMNIPAQUE IOHEXOL 300 MG/ML  SOLN COMPARISON:  CT the abdomen and pelvis 02/16/2020. FINDINGS: Lower chest: Cardiomegaly. Atherosclerotic calcifications in the descending thoracic aorta as well as the left anterior descending, left circumflex and right coronary arteries. Thickening calcification of the aortic valve. Hepatobiliary: Multiple new hypovascular lesions scattered throughout the hepatic parenchyma, indicative of widespread hepatic metastatic disease, largest of which is in the inferior aspect of the right lobe of the liver (axial image 30 of series 2 and coronal image 27 of series 5) measuring 8.6 x 6.2 x 6.2 cm. No intra or extrahepatic biliary ductal dilatation. Gallbladder is unremarkable in appearance.  Pancreas: No pancreatic mass. No pancreatic ductal dilatation. No pancreatic or peripancreatic fluid collections or inflammatory changes. Spleen: Unremarkable. Adrenals/Urinary Tract: Multiple low-attenuation lesions scattered throughout both kidneys, similar to the prior study. Many of these are simple (Bosniak class 1), while others are mildly complex with some internal thin septations or thin peripheral calcifications (Bosniak class 2). The largest lesion is in the lower pole of the left kidney where there is an exophytic lesion measuring up to 7.5 x 6.7 cm. No suspicious renal lesions. No hydroureteronephrosis. Urinary bladder is normal in appearance. Stomach/Bowel: Normal appearance of the stomach. No pathologic dilatation of small bowel or colon. Status post partial colectomy with resection of the mid and distal transverse colon. The appendix is not confidently identified and may be surgically absent. Regardless, there are no inflammatory changes noted adjacent to the cecum to suggest the presence of an acute appendicitis at this time. Vascular/Lymphatic: Aortic atherosclerosis, without evidence of aneurysm or dissection in the abdominal or pelvic vasculature. Borderline enlarged 9 mm short axis gastrohepatic ligament lymph node (axial image 22 of series 2). No other lymphadenopathy noted in the abdomen or pelvis. Reproductive: Prostate gland and seminal vesicles are unremarkable in appearance. Other: Small left inguinal hernia containing only omental fat. No significant volume of ascites. No pneumoperitoneum. Musculoskeletal: There are no aggressive appearing lytic or blastic lesions noted in the visualized portions of the skeleton. IMPRESSION: 1. Interval development of widespread metastatic disease to the liver with multiple hypovascular hepatic masses, largest of which measures up to 8.6 x 6.2 x 6.2 cm. Borderline enlarged gastrohepatic ligament lymph  node is nonspecific, but suspicious for potential nodal  metastasis. 2. Aortic atherosclerosis, in addition to 3 vessel coronary artery disease. 3. There are calcifications of the aortic valve. Echocardiographic correlation for evaluation of potential valvular dysfunction may be warranted if clinically indicated. 4. Additional incidental findings, as above. Electronically Signed   By: Vinnie Langton M.D.   On: 09/21/2020 10:56    Medications: I have reviewed the patient's current medications.  Assessment/Plan: 1. Colon cancer-transverse, stage IIIb (T3N1), status post a segmental colectomy 04/13/2020 ? 1/10 lymph nodes, lymphovascular invasion present, MSS, resection margins negative for invasive cancer, "closest" margin positive for low-grade dysplasia ? Colonoscopy 02/09/2020-ulcerated nonobstructing mass at the splenic flexure, 4 mm polyp of the rectum, splenic flexure lesion-adenocarcinoma with intact mismatch repair protein expression, tubular adenoma at the rectal polyp ? CT abdomen/pelvis 02/16/2020-apple core lesion at the distal transverse colon, no evidence of metastatic disease ? CT chest 03/06/2020-no evidence of metastatic disease, stable benign lung nodules ? CT abdomen/pelvis 09/20/2020-interval development of widespread metastatic disease to the liver with multiple hypervascular hepatic masses, largest measuring 8.6 x 6.2 x 6.2 cm. Enlarged gastrohepatic ligament lymph node. 2. Rectal bleeding secondary to #1 3. Anemia-likely secondary to GI bleeding 4. Atrial fibrillation, maintained on apixaban 5. BPH 6. Glaucoma 7. Hypertension 8. Hypothyroid 9. History of a CVA 10. Positive resection margin for low-grade dysplasia on the colectomy specimen 04/13/2020  Disposition: Dylan York appears stable.  The recent CT scan shows evidence of metastatic disease involving the liver.  He understands findings are consistent with metastatic colon cancer to the liver and that no therapy will be curative.  Dr. Benay Spice reviewed options to include  supportive/comfort care/hospice referral versus a trial of systemic therapy.  Dylan York is interested in systemic therapy.  Dr. Benay Spice recommends the FOLFOX regimen.  We reviewed potential toxicities including bone marrow toxicity, nausea, hair loss.  We discussed the various forms of neuropathy associated with oxaliplatin including cold sensitivity, peripheral neuropathy, acute laryngopharyngeal dysesthesia and more rare occurrences such as diplopia, ataxia, incontinence.  We reviewed potential toxicities associated with 5-fluorouracil including mouth sores, diarrhea, increased sensitivity to sun, skin rash, hand-foot syndrome, skin hypermigmentation.  He agrees to proceed.  He understands this regimen requires a Port-A-Cath.  We made a referral to interventional radiology.  Prescription sent to his pharmacy for Compazine and EMLA cream.  He will attend a chemotherapy education class.  At the end of the office visit Dylan York indicated it would be significantly more convenient to receive future care at the [facility] in [place].  We will make a referral to Dr. Delton York.  Patient seen with Dr. Benay Spice.  Ned Card ANP/GNP-BC   09/21/2020  12:51 PM  This was a shared visit with Ned Card.  Dylan York has recurrent colon cancer.  The CEA is markedly elevated and the CT reveals extensive liver metastases.  I reviewed the CT images with Dylan York and his wife.  We discussed treatment options.  He understands no therapy will be curative.  We discussed the expected survival with supportive care versus systemic chemotherapy.  We reviewed treatment options including comfort care versus a trial of systemic chemotherapy.  We discussed single agent capecitabine, capecitabine/bevacizumab, and FOLFOX chemotherapy.  I do not recommend bevacizumab with his history of a CVA and atrial fibrillation.  He indicated he does wish to proceed with systemic therapy.  Dylan York has a good performance  status and is very functional relative to the average person  of his age.  I recommend FOLFOX chemotherapy.  He understands the need for Port-A-Cath placement.  We reviewed potential toxicities associated with the FOLFOX regimen.  Dylan York indicates it will be easier for him to receive treatment at the [place] center.  I will contact Dr. Delton York and request he is soon the care of Dylan York.  I am available to see him in the future as needed.  Julieanne Manson, MD

## 2020-09-24 ENCOUNTER — Telehealth: Payer: Self-pay | Admitting: Pharmacist Clinician (PhC)/ Clinical Pharmacy Specialist

## 2020-09-24 ENCOUNTER — Telehealth: Payer: Self-pay | Admitting: Nurse Practitioner

## 2020-09-24 ENCOUNTER — Encounter (HOSPITAL_COMMUNITY): Payer: Self-pay

## 2020-09-24 NOTE — Telephone Encounter (Signed)
Made no changes to pt's scheduler per 11/19 los and 11/22 staff msg

## 2020-09-24 NOTE — Telephone Encounter (Signed)
Patient sent by mail information about home BP readings since starting back on spironolactone 12.5 mg daily.  Patient averaged readings, before addition of spironolactone was 126/90.  After addition, dropped to 115/85.    LMOM for patient to continue with current plan, with his age, don't want to increase dose and potentially lower systolic any further.

## 2020-09-24 NOTE — Progress Notes (Signed)
I have placed an introductory phone call to this patient today. I introduced myself and explained my role in the patient's care. Patient reports no known barriers to care at this time and is very inquisitive on when he can proceed with treatment. I briefly discussed what the patient can expect tomorrow during his initial visit with Dr. Delton Coombes. I will plan to meet with this patient tomorrow during his initial visit with Dr. Delton Coombes.

## 2020-09-25 ENCOUNTER — Ambulatory Visit: Payer: Medicare Other | Admitting: General Surgery

## 2020-09-25 ENCOUNTER — Inpatient Hospital Stay (HOSPITAL_COMMUNITY): Payer: Medicare Other | Admitting: Hematology

## 2020-10-02 ENCOUNTER — Telehealth: Payer: Self-pay | Admitting: *Deleted

## 2020-10-02 DIAGNOSIS — N289 Disorder of kidney and ureter, unspecified: Secondary | ICD-10-CM | POA: Diagnosis not present

## 2020-10-02 DIAGNOSIS — I10 Essential (primary) hypertension: Secondary | ICD-10-CM | POA: Diagnosis not present

## 2020-10-02 NOTE — Telephone Encounter (Signed)
Left VM for IR requesting port placement next week if possible. Requesting late am or early afternoon due to needing to attend chemo education class that day before he is sedated. He is also on Eliquis for Afib. Followed by Dr. Shelva Majestic.

## 2020-10-02 NOTE — Telephone Encounter (Signed)
Provided patient with appointment on 10/08/20 to arrive at [facility] radiology at 12:00 for port placement. Instructed him to be NPO after 7 am that day and to take last dose of Eliquis on 10/05/20. Also informed he needs a driver. Scheduled his education session at 10:00 same day to limit excess travel. He was able to read all instructions back to nurse.

## 2020-10-02 NOTE — Progress Notes (Signed)
Patient returned my phone.  I discussed with the patient why he didn't go see Dr. Delton Coombes at [facility] and he states that he originally had an appt for 11/23 which he couldn't keep, he got rescheduled for 12/1 and then they called him stating they needed to reschedule him to 12/17.  He states he got very frustrated feeling like 12/17 was a long time to wait.  He had also received a call to schedule his port placement which he declined thinking he needed to see the medical oncologist first.  I confirmed with the patient that he indeed does want to proceed with treatment that was outlined by Dr. Benay Spice  and to receive treatment here in [place]. I explained to him in order to get Folfox he has to have a port placed and a chemo education class.  He is in agreement to proceed with these.  I explained to him that Merceda Elks RN (Dr. Gearldine Shown nurse) will be contacting him with appointments for these and to see Dr. Benay Spice. He verbalized an understanding.

## 2020-10-03 ENCOUNTER — Other Ambulatory Visit: Payer: Self-pay | Admitting: *Deleted

## 2020-10-03 ENCOUNTER — Telehealth: Payer: Self-pay

## 2020-10-03 ENCOUNTER — Ambulatory Visit (HOSPITAL_COMMUNITY): Payer: Medicare Other | Admitting: Hematology

## 2020-10-03 DIAGNOSIS — C184 Malignant neoplasm of transverse colon: Secondary | ICD-10-CM

## 2020-10-03 NOTE — Telephone Encounter (Signed)
Spoke with patient and his wife regarding follow up which I have scheduled for Friday 12/3 to be here at 10:00 am.  They are in agreement with this time.

## 2020-10-03 NOTE — Progress Notes (Signed)
Scheduling message sent for L/flush/OV/1st FOLFOX on 12/09

## 2020-10-04 ENCOUNTER — Telehealth: Payer: Self-pay | Admitting: Oncology

## 2020-10-04 ENCOUNTER — Other Ambulatory Visit: Payer: Self-pay | Admitting: Radiology

## 2020-10-04 NOTE — Telephone Encounter (Signed)
Scheduled appointments per 12/1 scheduling msg. Spoke to patient who is aware of appointment date and time.

## 2020-10-05 ENCOUNTER — Other Ambulatory Visit: Payer: Self-pay

## 2020-10-05 ENCOUNTER — Encounter: Payer: Self-pay | Admitting: Nurse Practitioner

## 2020-10-05 ENCOUNTER — Inpatient Hospital Stay: Payer: Medicare Other | Attending: Oncology | Admitting: Nurse Practitioner

## 2020-10-05 VITALS — BP 122/84 | HR 98 | Temp 98.0°F | Resp 18 | Ht 68.0 in | Wt 132.5 lb

## 2020-10-05 DIAGNOSIS — Z7901 Long term (current) use of anticoagulants: Secondary | ICD-10-CM | POA: Insufficient documentation

## 2020-10-05 DIAGNOSIS — D649 Anemia, unspecified: Secondary | ICD-10-CM | POA: Diagnosis not present

## 2020-10-05 DIAGNOSIS — C184 Malignant neoplasm of transverse colon: Secondary | ICD-10-CM | POA: Insufficient documentation

## 2020-10-05 DIAGNOSIS — E039 Hypothyroidism, unspecified: Secondary | ICD-10-CM | POA: Diagnosis not present

## 2020-10-05 DIAGNOSIS — Z5111 Encounter for antineoplastic chemotherapy: Secondary | ICD-10-CM | POA: Insufficient documentation

## 2020-10-05 DIAGNOSIS — I10 Essential (primary) hypertension: Secondary | ICD-10-CM | POA: Insufficient documentation

## 2020-10-05 DIAGNOSIS — C787 Secondary malignant neoplasm of liver and intrahepatic bile duct: Secondary | ICD-10-CM

## 2020-10-05 DIAGNOSIS — N400 Enlarged prostate without lower urinary tract symptoms: Secondary | ICD-10-CM | POA: Insufficient documentation

## 2020-10-05 DIAGNOSIS — Z7189 Other specified counseling: Secondary | ICD-10-CM

## 2020-10-05 DIAGNOSIS — C189 Malignant neoplasm of colon, unspecified: Secondary | ICD-10-CM | POA: Diagnosis not present

## 2020-10-05 DIAGNOSIS — H409 Unspecified glaucoma: Secondary | ICD-10-CM | POA: Diagnosis not present

## 2020-10-05 DIAGNOSIS — I4891 Unspecified atrial fibrillation: Secondary | ICD-10-CM | POA: Insufficient documentation

## 2020-10-05 DIAGNOSIS — D701 Agranulocytosis secondary to cancer chemotherapy: Secondary | ICD-10-CM | POA: Diagnosis not present

## 2020-10-05 DIAGNOSIS — Z8673 Personal history of transient ischemic attack (TIA), and cerebral infarction without residual deficits: Secondary | ICD-10-CM | POA: Diagnosis not present

## 2020-10-05 NOTE — Progress Notes (Signed)
Pharmacist Chemotherapy Monitoring - Initial Assessment    Anticipated start date: 10/11/20   Regimen:  . Are orders appropriate based on the patient's diagnosis, regimen, and cycle? Yes . Does the plan date match the patient's scheduled date? Yes . Is the sequencing of drugs appropriate? Yes . Are the premedications appropriate for the patient's regimen? Yes . Prior Authorization for treatment is: Not Started o If applicable, is the correct biosimilar selected based on the patient's insurance? not applicable  Organ Function and Labs: Marland Kitchen Are dose adjustments needed based on the patient's renal function, hepatic function, or hematologic function? Yes . Are appropriate labs ordered prior to the start of patient's treatment? Yes . Other organ system assessment, if indicated: N/A . The following baseline labs, if indicated, have been ordered: N/A  Dose Assessment: . Are the drug doses appropriate? Yes . Are the following correct: o Drug concentrations Yes o IV fluid compatible with drug Yes o Administration routes Yes o Timing of therapy Yes . If applicable, does the patient have documented access for treatment and/or plans for port-a-cath placement? yes . If applicable, have lifetime cumulative doses been properly documented and assessed? not applicable Lifetime Dose Tracking  No doses have been documented on this patient for the following tracked chemicals: Doxorubicin, Epirubicin, Idarubicin, Daunorubicin, Mitoxantrone, Bleomycin, Oxaliplatin, Carboplatin, Liposomal Doxorubicin  o   Toxicity Monitoring/Prevention: . The patient has the following take home antiemetics prescribed: Prochlorperazine . The patient has the following take home medications prescribed: N/A . Medication allergies and previous infusion related reactions, if applicable, have been reviewed and addressed. Yes . The patient's current medication list has been assessed for drug-drug interactions with their  chemotherapy regimen. no significant drug-drug interactions were identified on review.  Order Review: . Are the treatment plan orders signed? No . Is the patient scheduled to see a provider prior to their treatment? No  I verify that I have reviewed each item in the above checklist and answered each question accordingly.  Dylan York 10/05/2020 10:18 AM

## 2020-10-05 NOTE — Progress Notes (Addendum)
  [place] OFFICE PROGRESS NOTE   Diagnosis: Colon cancer  INTERVAL HISTORY:   Dylan York returns for follow-up.  At the time of his last visit 09/21/2020 he requested to receive care/treatment at the [facility].  He recently contacted the office here requesting follow-up/chemotherapy in [place].  He has occasional pain related to the left inguinal hernia.  Otherwise feels fairly well.  No nausea or vomiting.  Some constipation.  No baseline neuropathy symptoms.  Objective:  Vital signs in last 24 hours:  Blood pressure 122/84, pulse 98, temperature 98 F (36.7 C), temperature source Tympanic, resp. rate 18, height $RemoveBe'5\' 8"'pqOhZeeuK$  (1.727 m), weight 132 lb 8 oz (60.1 kg), SpO2 98 %.    HEENT: No thrush or ulcers. Resp: Lungs clear bilaterally. Cardio: Irregular. GI: Liver palpable right abdomen. Vascular: No leg edema. Neuro: Alert and oriented.   Lab Results:  Lab Results  Component Value Date   WBC 4.9 05/15/2020   HGB 11.7 (L) 05/15/2020   HCT 35.4 (L) 05/15/2020   MCV 95 05/15/2020   PLT 161 05/15/2020   NEUTROABS 4.3 04/14/2020    Imaging:  No results found.  Medications: I have reviewed the patient's current medications.  Assessment/Plan: 1. Colon cancer-transverse, stage IIIb (T3N1), status post a segmental colectomy 04/13/2020 ? 1/10 lymph nodes, lymphovascular invasion present, MSS, resection margins negative for invasive cancer, "closest" margin positive for low-grade dysplasia ? Colonoscopy 02/09/2020-ulcerated nonobstructing mass at the splenic flexure, 4 mm polyp of the rectum, splenic flexure lesion-adenocarcinoma with intact mismatch repair protein expression, tubular adenoma at the rectal polyp ? CT abdomen/pelvis 02/16/2020-apple core lesion at the distal transverse colon, no evidence of metastatic disease ? CT chest 03/06/2020-no evidence of metastatic disease, stable benign lung nodules ? CT abdomen/pelvis 09/20/2020-interval  development of widespread metastatic disease to the liver with multiple hypervascular hepatic masses, largest measuring 8.6 x 6.2 x 6.2 cm. Enlarged gastrohepatic ligament lymph node. ? Cycle 1 FOLFOX 10/11/2020 (planned) 2. Rectal bleeding secondary to #1 3. Anemia-likely secondary to GI bleeding 4. Atrial fibrillation, maintained on apixaban 5. BPH 6. Glaucoma 7. Hypertension 8. Hypothyroid 9. History of a CVA 10. Positive resection margin for low-grade dysplasia on the colectomy specimen 04/13/2020  Disposition: Dylan York has metastatic colon cancer.  He would like to proceed with FOLFOX as previously discussed.  We again reviewed potential toxicities.  He and his wife understand no therapy will be curative.  He will attend a chemotherapy education class and have a Port-A-Cath placed on 10/08/2020.  He will return for cycle 1 FOLFOX on 10/11/2020.  We will see him in follow-up prior to cycle 2 on 10/31/2020.  He will contact the office in the interim with any problems.  Patient seen with Dr. Benay Spice.    Ned Card ANP/GNP-BC   10/05/2020  11:11 AM This was a shared visit with Ned Card.  Dylan York has decided to proceed with FOLFOX chemotherapy in [place].  We reviewed CT images and the FOLFOX regimen with him again today.  He will undergo Port-A-Cath placement and cycle 1 FOLFOX is scheduled for 10/11/2020.  A chemotherapy plan was entered today.  Julieanne Manson, MD

## 2020-10-06 DIAGNOSIS — C787 Secondary malignant neoplasm of liver and intrahepatic bile duct: Secondary | ICD-10-CM | POA: Insufficient documentation

## 2020-10-06 DIAGNOSIS — C189 Malignant neoplasm of colon, unspecified: Secondary | ICD-10-CM | POA: Insufficient documentation

## 2020-10-06 DIAGNOSIS — Z7189 Other specified counseling: Secondary | ICD-10-CM | POA: Insufficient documentation

## 2020-10-06 NOTE — Progress Notes (Signed)
START ON PATHWAY REGIMEN - Colorectal     A cycle is every 14 days:     Oxaliplatin      Leucovorin      Fluorouracil      Fluorouracil   **Always confirm dose/schedule in your pharmacy ordering system**  Patient Characteristics: Distant Metastases, Nonsurgical Candidate, KRAS/NRAS Mutation Positive/Unknown (BRAF V600 Wild-Type/Unknown), Standard Cytotoxic Therapy, First Line Standard Cytotoxic Therapy, Bevacizumab Ineligible, PS = 0,1 Tumor Location: Colon Therapeutic Status: Distant Metastases Microsatellite/Mismatch Repair Status: MSS/pMMR BRAF Mutation Status: Awaiting Test Results KRAS/NRAS Mutation Status: Awaiting Test Results Standard Cytotoxic Line of Therapy: First Line Standard Cytotoxic Therapy ECOG Performance Status: 1 Bevacizumab Eligibility: Ineligible Intent of Therapy: Non-Curative / Palliative Intent, Discussed with Patient 

## 2020-10-08 ENCOUNTER — Inpatient Hospital Stay: Payer: Medicare Other

## 2020-10-08 ENCOUNTER — Encounter (HOSPITAL_COMMUNITY): Payer: Self-pay

## 2020-10-08 ENCOUNTER — Ambulatory Visit (HOSPITAL_COMMUNITY)
Admission: RE | Admit: 2020-10-08 | Discharge: 2020-10-08 | Disposition: A | Discharge status: Home / Self Care | Payer: Medicare Other | Source: Ambulatory Visit | Attending: Nurse Practitioner | Admitting: Nurse Practitioner

## 2020-10-08 ENCOUNTER — Other Ambulatory Visit: Payer: Self-pay

## 2020-10-08 ENCOUNTER — Encounter: Payer: Self-pay | Admitting: Oncology

## 2020-10-08 ENCOUNTER — Telehealth: Payer: Self-pay | Admitting: *Deleted

## 2020-10-08 DIAGNOSIS — C184 Malignant neoplasm of transverse colon: Secondary | ICD-10-CM | POA: Insufficient documentation

## 2020-10-08 DIAGNOSIS — Z888 Allergy status to other drugs, medicaments and biological substances status: Secondary | ICD-10-CM | POA: Diagnosis not present

## 2020-10-08 DIAGNOSIS — C189 Malignant neoplasm of colon, unspecified: Secondary | ICD-10-CM | POA: Diagnosis not present

## 2020-10-08 DIAGNOSIS — Z79890 Hormone replacement therapy (postmenopausal): Secondary | ICD-10-CM | POA: Insufficient documentation

## 2020-10-08 DIAGNOSIS — C787 Secondary malignant neoplasm of liver and intrahepatic bile duct: Secondary | ICD-10-CM | POA: Diagnosis not present

## 2020-10-08 DIAGNOSIS — Z7901 Long term (current) use of anticoagulants: Secondary | ICD-10-CM | POA: Insufficient documentation

## 2020-10-08 DIAGNOSIS — Z8042 Family history of malignant neoplasm of prostate: Secondary | ICD-10-CM | POA: Insufficient documentation

## 2020-10-08 DIAGNOSIS — Z8249 Family history of ischemic heart disease and other diseases of the circulatory system: Secondary | ICD-10-CM | POA: Insufficient documentation

## 2020-10-08 DIAGNOSIS — Z79899 Other long term (current) drug therapy: Secondary | ICD-10-CM | POA: Diagnosis not present

## 2020-10-08 DIAGNOSIS — Z87891 Personal history of nicotine dependence: Secondary | ICD-10-CM | POA: Diagnosis not present

## 2020-10-08 DIAGNOSIS — Z809 Family history of malignant neoplasm, unspecified: Secondary | ICD-10-CM | POA: Diagnosis not present

## 2020-10-08 DIAGNOSIS — Z452 Encounter for adjustment and management of vascular access device: Secondary | ICD-10-CM | POA: Diagnosis not present

## 2020-10-08 HISTORY — PX: IR IMAGING GUIDED PORT INSERTION: IMG5740

## 2020-10-08 LAB — CBC WITH DIFFERENTIAL/PLATELET
Abs Immature Granulocytes: 0.02 10*3/uL (ref 0.00–0.07)
Basophils Absolute: 0.1 10*3/uL (ref 0.0–0.1)
Basophils Relative: 1 %
Eosinophils Absolute: 0.2 10*3/uL (ref 0.0–0.5)
Eosinophils Relative: 3 %
HCT: 36 % — ABNORMAL LOW (ref 39.0–52.0)
Hemoglobin: 11.6 g/dL — ABNORMAL LOW (ref 13.0–17.0)
Immature Granulocytes: 0 %
Lymphocytes Relative: 17 %
Lymphs Abs: 1.1 10*3/uL (ref 0.7–4.0)
MCH: 31.4 pg (ref 26.0–34.0)
MCHC: 32.2 g/dL (ref 30.0–36.0)
MCV: 97.3 fL (ref 80.0–100.0)
Monocytes Absolute: 0.5 10*3/uL (ref 0.1–1.0)
Monocytes Relative: 8 %
Neutro Abs: 4.6 10*3/uL (ref 1.7–7.7)
Neutrophils Relative %: 71 %
Platelets: 198 10*3/uL (ref 150–400)
RBC: 3.7 MIL/uL — ABNORMAL LOW (ref 4.22–5.81)
RDW: 14.4 % (ref 11.5–15.5)
WBC: 6.5 10*3/uL (ref 4.0–10.5)
nRBC: 0 % (ref 0.0–0.2)

## 2020-10-08 LAB — PROTIME-INR
INR: 1.2 (ref 0.8–1.2)
Prothrombin Time: 14.7 seconds (ref 11.4–15.2)

## 2020-10-08 MED ORDER — FENTANYL CITRATE (PF) 100 MCG/2ML IJ SOLN
INTRAMUSCULAR | Status: AC | PRN
Start: 2020-10-08 — End: 2020-10-08
  Administered 2020-10-08 (×2): 25 ug via INTRAVENOUS

## 2020-10-08 MED ORDER — HEPARIN SOD (PORK) LOCK FLUSH 100 UNIT/ML IV SOLN
INTRAVENOUS | Status: AC
  Filled 2020-10-08: qty 5

## 2020-10-08 MED ORDER — MIDAZOLAM HCL 2 MG/2ML IJ SOLN
INTRAMUSCULAR | Status: AC
  Filled 2020-10-08: qty 2

## 2020-10-08 MED ORDER — MIDAZOLAM HCL 2 MG/2ML IJ SOLN
INTRAMUSCULAR | Status: AC | PRN
  Administered 2020-10-08 (×2): 0.5 mg via INTRAVENOUS

## 2020-10-08 MED ORDER — CEFAZOLIN SODIUM-DEXTROSE 2-4 GM/100ML-% IV SOLN: 2.0000 g | INTRAVENOUS | Status: AC

## 2020-10-08 MED ORDER — HEPARIN SOD (PORK) LOCK FLUSH 100 UNIT/ML IV SOLN
INTRAVENOUS | Status: AC | PRN
  Administered 2020-10-08: 500 [IU] via INTRAVENOUS

## 2020-10-08 MED ORDER — CEFAZOLIN SODIUM-DEXTROSE 2-4 GM/100ML-% IV SOLN
INTRAVENOUS | Status: AC
  Administered 2020-10-08: 2 g via INTRAVENOUS
  Filled 2020-10-08: qty 100

## 2020-10-08 MED ORDER — SODIUM CHLORIDE 0.9 % IV SOLN: INTRAVENOUS | Status: DC

## 2020-10-08 MED ORDER — LIDOCAINE HCL 1 % IJ SOLN
INTRAMUSCULAR | Status: AC
  Filled 2020-10-08: qty 20

## 2020-10-08 MED ORDER — FENTANYL CITRATE (PF) 100 MCG/2ML IJ SOLN
INTRAMUSCULAR | Status: AC
  Filled 2020-10-08: qty 2

## 2020-10-08 MED ORDER — LIDOCAINE HCL (PF) 1 % IJ SOLN
INTRAMUSCULAR | Status: AC | PRN
  Administered 2020-10-08 (×2): 10 mL via INTRADERMAL

## 2020-10-08 NOTE — Telephone Encounter (Signed)
Per Marlynn Perking, request for foundation 1 path from 04/13/20 emailed to [employer]. Awaiting results.

## 2020-10-08 NOTE — Procedures (Signed)
Interventional Radiology Procedure Note  Procedure: Single Lumen Power Port Placement    Access:  Right IJ vein.  Findings: Catheter tip positioned at SVC/RA junction. Port is ready for immediate use.   Complications: None  EBL: < 10 mL  Recommendations:  - Ok to shower in 24 hours - Do not submerge for 7 days - Routine line care   Dylan York T. Treyten Monestime, M.D Pager:  [phone]   

## 2020-10-08 NOTE — H&P (Signed)
Chief Complaint: Patient was seen in consultation today for port placement at the request of Vyas,Dhruv B  Referring Physician(s): Vyas,Dhruv B  Supervising Physician: Aletta Edouard  Patient Status: [facility] - Out-pt  History of Present Illness: Dylan York is a 86 y.o. male with metastatic colon cancer. He is to start chemotherapy soon and is scheduled for port placement today. PMHx, meds, labs, imaging, allergies reviewed. Has held his Eliquis since 12/3 as directed Feels well, no recent fevers, chills, illness. Has been NPO today as directed.     Past Medical History:  Diagnosis Date  . Atrial fibrillation ([place])   . Atrial tachycardia ([place])   . Biatrial enlargement    severe biatrial enlargement  . BPH (benign prostatic hyperplasia)   . Colon cancer ([place])   . Diabetes mellitus without complication ([place])   . Glaucoma   . Graded compression stocking in place   . Hypertension   . Hypothyroidism   . Neutropenia ([place])   . Rosacea   . Sinus bradycardia, with HR in the 40s BB stopped. 08/08/2013  . SSS (sick sinus syndrome) ([place])   . Stroke [facility])    no residual  . Thyroid disease   . Tricuspid regurgitation     Past Surgical History:  Procedure Laterality Date  . APPENDECTOMY    . BACTERIAL OVERGROWTH TEST N/A 01/14/2016   Procedure: BACTERIAL OVERGROWTH TEST;  Surgeon: Rogene Houston, MD;  Location: [facility];  Service: Endoscopy;  Laterality: N/A;  730  . BIOPSY  02/09/2020   Procedure: BIOPSY;  Surgeon: Rogene Houston, MD;  Location: [facility];  Service: Endoscopy;;  . COLONOSCOPY N/A 02/09/2020   Procedure: COLONOSCOPY;  Surgeon: Rogene Houston, MD;  Location: [facility];  Service: Endoscopy;  Laterality: N/A;  1225  . HERNIA REPAIR    . INGUINAL HERNIA REPAIR Right   . POLYPECTOMY  02/09/2020   Procedure: POLYPECTOMY;  Surgeon: Rogene Houston, MD;  Location: [facility];  Service: Endoscopy;;  . TRANSURETHRAL RESECTION OF PROSTATE    .  UMBILICAL HERNIA REPAIR      Allergies: Amiodarone and Multaq [dronedarone]  Medications: Prior to Admission medications   Medication Sig Start Date End Date Taking? Authorizing Provider  COMBIGAN 0.2-0.5 % ophthalmic solution Place 1 drop into both eyes 2 (two) times daily. 05/13/14  Yes [provider]  finasteride (PROSCAR) 5 MG tablet Take 5 mg by mouth daily.   Yes [provider]  levothyroxine (SYNTHROID, LEVOTHROID) 75 MCG tablet Take 75 mcg by mouth daily before breakfast.  08/06/18  Yes [provider]  LORazepam (ATIVAN) 1 MG tablet Take 0.5 mg by mouth at bedtime as needed for sleep.  01/18/16  Yes [provider]  metoprolol succinate (TOPROL-XL) 50 MG 24 hr tablet TAKE 2 TABLETS BY MOUTH TWICE A DAY (50 MG TWICE A DAY) Patient taking differently: Take 50 mg by mouth in the morning and at bedtime. TAKE 2 TABLETS BY MOUTH TWICE A DAY (50 MG TWICE A DAY) 02/15/20  Yes Troy Sine, MD  Multiple Vitamins-Minerals (CENTRUM SILVER PO) Take 1 tablet by mouth daily.    Yes [provider]  spironolactone (ALDACTONE) 25 MG tablet Take 0.5 tablets (12.5 mg total) by mouth daily. 07/17/20 10/15/20 Yes Troy Sine, MD  tamsulosin (FLOMAX) 0.4 MG CAPS capsule Take 1 capsule (0.4 mg total) by mouth at bedtime. 06/13/20  Yes Troy Sine, MD  Vitamin D, Cholecalciferol, 50 MCG (  2000 UT) CAPS Take 2,000 Units by mouth daily.    Yes [provider]  docusate sodium (COLACE) 100 MG capsule Take 2 capsules (200 mg total) by mouth at bedtime. Patient not taking: Reported on 10/05/2020 04/13/18   Rogene Houston, MD  ELIQUIS 5 MG TABS tablet TAKE 1 TABLET BY MOUTH TWICE DAILY 08/09/20   Troy Sine, MD  lidocaine-prilocaine (EMLA) cream Apply to port site 1-2 hours prior to use Patient not taking: Reported on 10/05/2020 09/21/20   Owens Shark, NP  prochlorperazine (COMPAZINE) 5 MG tablet Take 1 tablet (5 mg total) by mouth every 6 (six)  hours as needed for nausea or vomiting. Patient not taking: Reported on 10/05/2020 09/21/20   Owens Shark, NP  Sulfacetamide Sodium-Sulfur 10-5 % CREA Apply topically daily. To forehead Patient not taking: Reported on 09/11/2020    [provider]     Family History  Problem Relation Age of Onset  . Cancer Mother   . Heart Problems Father        atherosclerosis  . Cancer - Prostate Paternal Grandfather     Social History   Socioeconomic History  . Marital status: Married    Spouse name: Not on file  . Number of children: Not on file  . Years of education: Not on file  . Highest education level: Not on file  Occupational History  . Not on file  Tobacco Use  . Smoking status: Former Smoker    Quit date: 08/1977    Years since quitting: 43.2  . Smokeless tobacco: Never Used  Vaping Use  . Vaping Use: Never used  Substance and Sexual Activity  . Alcohol use: No    Alcohol/week: 0.0 standard drinks  . Drug use: No  . Sexual activity: Not on file  Other Topics Concern  . Not on file  Social History Narrative  . Not on file   Social Determinants of Health   Financial Resource Strain: Low Risk   . Difficulty of Paying Living Expenses: Not hard at all  Food Insecurity: No Food Insecurity  . Worried About Charity fundraiser in the Last Year: Never true  . Ran Out of Food in the Last Year: Never true  Transportation Needs: No Transportation Needs  . Lack of Transportation (Medical): No  . Lack of Transportation (Non-Medical): No  Physical Activity: Inactive  . Days of Exercise per Week: 0 days  . Minutes of Exercise per Session: 0 min  Stress: No Stress Concern Present  . Feeling of Stress : Not at all  Social Connections: Socially Integrated  . Frequency of Communication with Friends and Family: More than three times a week  . Frequency of Social Gatherings with Friends and Family: Three times a week  . Attends Religious Services: 1 to 4 times per year  .  Active Member of Clubs or Organizations: No  . Attends Archivist Meetings: 1 to 4 times per year  . Marital Status: Married    Review of Systems: A 12 point ROS discussed and pertinent positives are indicated in the HPI above.  All other systems are negative.  Review of Systems  Vital Signs: There were no vitals taken for this visit.  Physical Exam Constitutional:      Appearance: Normal appearance. He is not ill-appearing.  HENT:     Mouth/Throat:     Mouth: Mucous membranes are moist.     Pharynx: Oropharynx is clear.  Cardiovascular:  Rate and Rhythm: Normal rate and regular rhythm.     Heart sounds: Normal heart sounds.  Pulmonary:     Effort: Pulmonary effort is normal. No respiratory distress.     Breath sounds: Normal breath sounds.  Skin:    General: Skin is warm and dry.  Neurological:     General: No focal deficit present.     Mental Status: He is alert and oriented to person, place, and time.  Psychiatric:        Mood and Affect: Mood normal.        Thought Content: Thought content normal.        Judgment: Judgment normal.     Imaging: CT ABDOMEN PELVIS W CONTRAST  Result Date: 09/21/2020 CLINICAL DATA:  86 year old male with history of colon cancer. Evaluate for liver metastases. EXAM: CT ABDOMEN AND PELVIS WITH CONTRAST TECHNIQUE: Multidetector CT imaging of the abdomen and pelvis was performed using the standard protocol following bolus administration of intravenous contrast. CONTRAST:  177mL OMNIPAQUE IOHEXOL 300 MG/ML  SOLN COMPARISON:  CT the abdomen and pelvis 02/16/2020. FINDINGS: Lower chest: Cardiomegaly. Atherosclerotic calcifications in the descending thoracic aorta as well as the left anterior descending, left circumflex and right coronary arteries. Thickening calcification of the aortic valve. Hepatobiliary: Multiple new hypovascular lesions scattered throughout the hepatic parenchyma, indicative of widespread hepatic metastatic disease,  largest of which is in the inferior aspect of the right lobe of the liver (axial image 30 of series 2 and coronal image 27 of series 5) measuring 8.6 x 6.2 x 6.2 cm. No intra or extrahepatic biliary ductal dilatation. Gallbladder is unremarkable in appearance. Pancreas: No pancreatic mass. No pancreatic ductal dilatation. No pancreatic or peripancreatic fluid collections or inflammatory changes. Spleen: Unremarkable. Adrenals/Urinary Tract: Multiple low-attenuation lesions scattered throughout both kidneys, similar to the prior study. Many of these are simple (Bosniak class 1), while others are mildly complex with some internal thin septations or thin peripheral calcifications (Bosniak class 2). The largest lesion is in the lower pole of the left kidney where there is an exophytic lesion measuring up to 7.5 x 6.7 cm. No suspicious renal lesions. No hydroureteronephrosis. Urinary bladder is normal in appearance. Stomach/Bowel: Normal appearance of the stomach. No pathologic dilatation of small bowel or colon. Status post partial colectomy with resection of the mid and distal transverse colon. The appendix is not confidently identified and may be surgically absent. Regardless, there are no inflammatory changes noted adjacent to the cecum to suggest the presence of an acute appendicitis at this time. Vascular/Lymphatic: Aortic atherosclerosis, without evidence of aneurysm or dissection in the abdominal or pelvic vasculature. Borderline enlarged 9 mm short axis gastrohepatic ligament lymph node (axial image 22 of series 2). No other lymphadenopathy noted in the abdomen or pelvis. Reproductive: Prostate gland and seminal vesicles are unremarkable in appearance. Other: Small left inguinal hernia containing only omental fat. No significant volume of ascites. No pneumoperitoneum. Musculoskeletal: There are no aggressive appearing lytic or blastic lesions noted in the visualized portions of the skeleton. IMPRESSION: 1.  Interval development of widespread metastatic disease to the liver with multiple hypovascular hepatic masses, largest of which measures up to 8.6 x 6.2 x 6.2 cm. Borderline enlarged gastrohepatic ligament lymph node is nonspecific, but suspicious for potential nodal metastasis. 2. Aortic atherosclerosis, in addition to 3 vessel coronary artery disease. 3. There are calcifications of the aortic valve. Echocardiographic correlation for evaluation of potential valvular dysfunction may be warranted if clinically indicated. 4. Additional incidental findings,  as above. Electronically Signed   By: Vinnie Langton M.D.   On: 09/21/2020 10:56    Labs:  CBC: Recent Labs    04/14/20 0211 04/15/20 0249 04/16/20 0446 05/15/20 1515  WBC 5.2 7.3 5.7 4.9  HGB 9.3* 8.9* 9.1* 11.7*  HCT 29.3* 27.5* 28.4* 35.4*  PLT 125* 123* 123* 161    COAGS: Recent Labs    04/10/20 1401  INR 1.5*  APTT 40*    BMP: Recent Labs    04/14/20 0211 04/14/20 0211 04/15/20 0249 04/16/20 0446 08/02/20 1455 09/11/20 0919  NA 133*   < > 136 135 141 137  K 4.2   < > 4.0 3.4* 4.4 4.7  CL 105   < > 107 105 105 104  CO2 21*   < > 23 23 24 23   GLUCOSE 207*   < > 108* 101* 98 99  BUN 13   < > 20 21 28  24*  CALCIUM 7.8*   < > 7.9* 8.1* 9.2 8.8*  CREATININE 1.05   < > 1.10 1.14 1.33* 1.17  GFRNONAA >60   < > 58* 56* 46* 59*  GFRAA >60  --  >60 >60 54*  --    < > = values in this interval not displayed.    LIVER FUNCTION TESTS: Recent Labs    04/10/20 1401  BILITOT 1.6*  AST 30  ALT 21  ALKPHOS 75  PROT 6.7  ALBUMIN 3.8    TUMOR MARKERS: No results for input(s): AFPTM, CEA, CA199, CHROMGRNA in the last 8760 hours.  Assessment and Plan: Metastatic colon cancer For port placement. Risks and benefits of image guided port-a-catheter placement was discussed with the patient including, but not limited to bleeding, infection, pneumothorax, or fibrin sheath development and need for additional  procedures.  All of the patient's questions were answered, patient is agreeable to proceed. Consent signed and in chart.    Thank you for this interesting consult.  I greatly enjoyed meeting Dylan York and look forward to participating in their care.  A copy of this report was sent to the requesting provider on this date.  Electronically Signed: Ascencion Dike, PA-C 10/08/2020, 12:55 PM   I spent a total of 20 minutes in face to face in clinical consultation, greater than 50% of which was counseling/coordinating care for port

## 2020-10-08 NOTE — Discharge Instructions (Signed)

## 2020-10-08 NOTE — Progress Notes (Signed)
Gave my card to Abigail Butts RN to give patient for any financial questions or concerns.

## 2020-10-09 ENCOUNTER — Encounter: Payer: Self-pay | Admitting: *Deleted

## 2020-10-09 NOTE — Progress Notes (Signed)
[place] Psychosocial Distress Screening Clinical Social Work  Clinical Social Work was referred by distress screening protocol.  The patient scored a 6 on the Psychosocial Distress Thermometer which indicates moderate distress. Clinical Social Worker contacted patient by phone to assess for distress and other psychosocial needs.  Patient stated he was doing "ok" after getting his port placed yesterday.  CSW provided education on CSW role and the support team at [facility].  CSW and patient discussed transportation as patient will be traveling from [place], [place].  Patient stated he has friends and family members that have offered to help with transportation, but he also plans to drive himself.  CSW encouraged patient to contact his supplemental insurance provider to explore possible transportation benefits; which he plan to do.  While on the phone with patient he received a delivery from [employer].  CSW provided contact information and encouraged patient to call with questions or concerns.      ONCBCN DISTRESS SCREENING 10/08/2020  Screening Type Initial Screening  Distress experienced in past week (1-10) 6  Practical problem type Food  Emotional problem type Adjusting to illness  Information Concerns Type Lack of info about treatment  Physical Problem type Mouth sores/swallowing;Constipation/diarrhea    Johnnye Lana, MSW, LCSW, OSW-C Clinical Social Worker [place] [phone]

## 2020-10-11 ENCOUNTER — Ambulatory Visit: Payer: Medicare Other | Admitting: Oncology

## 2020-10-11 ENCOUNTER — Inpatient Hospital Stay: Payer: Medicare Other

## 2020-10-11 ENCOUNTER — Other Ambulatory Visit: Payer: Self-pay

## 2020-10-11 VITALS — BP 133/95 | HR 114 | Temp 97.8°F | Resp 18

## 2020-10-11 DIAGNOSIS — C787 Secondary malignant neoplasm of liver and intrahepatic bile duct: Secondary | ICD-10-CM

## 2020-10-11 DIAGNOSIS — C184 Malignant neoplasm of transverse colon: Secondary | ICD-10-CM | POA: Diagnosis not present

## 2020-10-11 DIAGNOSIS — Z5111 Encounter for antineoplastic chemotherapy: Secondary | ICD-10-CM | POA: Diagnosis not present

## 2020-10-11 DIAGNOSIS — I4891 Unspecified atrial fibrillation: Secondary | ICD-10-CM | POA: Diagnosis not present

## 2020-10-11 DIAGNOSIS — Z95828 Presence of other vascular implants and grafts: Secondary | ICD-10-CM

## 2020-10-11 DIAGNOSIS — C189 Malignant neoplasm of colon, unspecified: Secondary | ICD-10-CM

## 2020-10-11 DIAGNOSIS — D649 Anemia, unspecified: Secondary | ICD-10-CM | POA: Diagnosis not present

## 2020-10-11 DIAGNOSIS — D701 Agranulocytosis secondary to cancer chemotherapy: Secondary | ICD-10-CM | POA: Diagnosis not present

## 2020-10-11 LAB — CMP (CANCER CENTER ONLY)
ALT: 23 U/L (ref 0–44)
AST: 71 U/L — ABNORMAL HIGH (ref 15–41)
Albumin: 3 g/dL — ABNORMAL LOW (ref 3.5–5.0)
Alkaline Phosphatase: 143 U/L — ABNORMAL HIGH (ref 38–126)
Anion gap: 12 (ref 5–15)
BUN: 24 mg/dL — ABNORMAL HIGH (ref 8–23)
CO2: 18 mmol/L — ABNORMAL LOW (ref 22–32)
Calcium: 8.7 mg/dL — ABNORMAL LOW (ref 8.9–10.3)
Chloride: 108 mmol/L (ref 98–111)
Creatinine: 1.03 mg/dL (ref 0.61–1.24)
GFR, Estimated: >60 mL/min (ref >60)
Glucose, Bld: 91 mg/dL (ref 70–99)
Potassium: 4.3 mmol/L (ref 3.5–5.1)
Sodium: 138 mmol/L (ref 135–145)
Total Bilirubin: 1.9 mg/dL — ABNORMAL HIGH (ref 0.3–1.2)
Total Protein: 6.4 g/dL — ABNORMAL LOW (ref 6.5–8.1)

## 2020-10-11 LAB — CBC WITH DIFFERENTIAL (CANCER CENTER ONLY)
Abs Immature Granulocytes: 0 10*3/uL (ref 0.00–0.07)
Basophils Absolute: 0.1 10*3/uL (ref 0.0–0.1)
Basophils Relative: 1 %
Eosinophils Absolute: 0.3 10*3/uL (ref 0.0–0.5)
Eosinophils Relative: 4 %
HCT: 33.9 % — ABNORMAL LOW (ref 39.0–52.0)
Hemoglobin: 11.2 g/dL — ABNORMAL LOW (ref 13.0–17.0)
Immature Granulocytes: 0 %
Lymphocytes Relative: 16 %
Lymphs Abs: 1 10*3/uL (ref 0.7–4.0)
MCH: 31.5 pg (ref 26.0–34.0)
MCHC: 33 g/dL (ref 30.0–36.0)
MCV: 95.5 fL (ref 80.0–100.0)
Monocytes Absolute: 0.6 10*3/uL (ref 0.1–1.0)
Monocytes Relative: 10 %
Neutro Abs: 4.4 10*3/uL (ref 1.7–7.7)
Neutrophils Relative %: 69 %
Platelet Count: 198 10*3/uL (ref 150–400)
RBC: 3.55 MIL/uL — ABNORMAL LOW (ref 4.22–5.81)
RDW: 14.3 % (ref 11.5–15.5)
WBC Count: 6.2 10*3/uL (ref 4.0–10.5)
nRBC: 0 % (ref 0.0–0.2)

## 2020-10-11 LAB — CEA (IN HOUSE-CHCC): CEA (CHCC-In House): 1058.81 ng/mL — ABNORMAL HIGH (ref 0.00–5.00)

## 2020-10-11 MED ORDER — PALONOSETRON HCL INJECTION 0.25 MG/5ML
INTRAVENOUS | Status: AC
  Filled 2020-10-11: qty 5

## 2020-10-11 MED ORDER — SODIUM CHLORIDE 0.9% FLUSH
10.0000 mL | Freq: Once | INTRAVENOUS | Status: AC
  Administered 2020-10-11: 10 mL via INTRAVENOUS
  Filled 2020-10-11: qty 10

## 2020-10-11 MED ORDER — PALONOSETRON HCL INJECTION 0.25 MG/5ML
0.2500 mg | Freq: Once | INTRAVENOUS | Status: AC
  Administered 2020-10-11: 0.25 mg via INTRAVENOUS

## 2020-10-11 MED ORDER — SODIUM CHLORIDE 0.9 % IV SOLN
2000.0000 mg/m2 | INTRAVENOUS | Status: DC
  Administered 2020-10-11: 3400 mg via INTRAVENOUS
  Filled 2020-10-11: qty 68

## 2020-10-11 MED ORDER — LEUCOVORIN CALCIUM INJECTION 350 MG
400.0000 mg/m2 | Freq: Once | INTRAVENOUS | Status: AC
  Administered 2020-10-11: 680 mg via INTRAVENOUS
  Filled 2020-10-11: qty 34

## 2020-10-11 MED ORDER — DEXTROSE 5 % IV SOLN
Freq: Once | INTRAVENOUS | Status: AC
  Filled 2020-10-11: qty 250

## 2020-10-11 MED ORDER — OXALIPLATIN CHEMO INJECTION 100 MG/20ML
85.0000 mg/m2 | Freq: Once | INTRAVENOUS | Status: AC
  Administered 2020-10-11: 145 mg via INTRAVENOUS
  Filled 2020-10-11: qty 10

## 2020-10-11 MED ORDER — SODIUM CHLORIDE 0.9 % IV SOLN
10.0000 mg | Freq: Once | INTRAVENOUS | Status: AC
  Administered 2020-10-11: 10 mg via INTRAVENOUS
  Filled 2020-10-11: qty 10

## 2020-10-11 NOTE — Patient Instructions (Signed)
[facility] Discharge Instructions for Patients Receiving Chemotherapy  Today you received the following chemotherapy agents: Oxaliplatin, Leucovorin, Fluorouracil (5FU)  To help prevent nausea and vomiting after your treatment, we encourage you to take your nausea medication as prescribed.  DO NOT TAKE ZOFRAN (ONDANSTERON) FOR THREE DAYS AFTER TREATMENT!   If you develop nausea and vomiting that is not controlled by your nausea medication, call the clinic.   BELOW ARE SYMPTOMS THAT SHOULD BE REPORTED IMMEDIATELY:  *FEVER GREATER THAN 100.5 F  *CHILLS WITH OR WITHOUT FEVER  NAUSEA AND VOMITING THAT IS NOT CONTROLLED WITH YOUR NAUSEA MEDICATION  *UNUSUAL SHORTNESS OF BREATH  *UNUSUAL BRUISING OR BLEEDING  TENDERNESS IN MOUTH AND THROAT WITH OR WITHOUT PRESENCE OF ULCERS  *URINARY PROBLEMS  *BOWEL PROBLEMS  UNUSUAL RASH Items with * indicate a potential emergency and should be followed up as soon as possible.  Feel free to call the clinic should you have any questions or concerns. The clinic phone number is [phone].  Please show the [facility] at check-in to the Emergency Department and triage nurse.    Oxaliplatin Injection What is this medicine? OXALIPLATIN (ox AL i PLA tin) is a chemotherapy drug. It targets fast dividing cells, like cancer cells, and causes these cells to die. This medicine is used to treat cancers of the colon and rectum, and many other cancers. This medicine may be used for other purposes; ask your health care provider or pharmacist if you have questions. COMMON BRAND NAME(S): Eloxatin What should I tell my health care provider before I take this medicine? They need to know if you have any of these conditions:  heart disease  history of irregular heartbeat  liver disease  low blood counts, like white cells, platelets, or red blood cells  lung or breathing disease, like asthma  take medicines that treat or prevent  blood clots  tingling of the fingers or toes, or other nerve disorder  an unusual or allergic reaction to oxaliplatin, other chemotherapy, other medicines, foods, dyes, or preservatives  pregnant or trying to get pregnant  breast-feeding How should I use this medicine? This drug is given as an infusion into a vein. It is administered in a hospital or clinic by a specially trained health care professional. Talk to your pediatrician regarding the use of this medicine in children. Special care may be needed. Overdosage: If you think you have taken too much of this medicine contact a poison control center or emergency room at once. NOTE: This medicine is only for you. Do not share this medicine with others. What if I miss a dose? It is important not to miss a dose. Call your doctor or health care professional if you are unable to keep an appointment. What may interact with this medicine? Do not take this medicine with any of the following medications:  cisapride  dronedarone  pimozide  thioridazine This medicine may also interact with the following medications:  aspirin and aspirin-like medicines  certain medicines that treat or prevent blood clots like warfarin, apixaban, dabigatran, and rivaroxaban  cisplatin  cyclosporine  diuretics  medicines for infection like acyclovir, adefovir, amphotericin B, bacitracin, cidofovir, foscarnet, ganciclovir, gentamicin, pentamidine, vancomycin  NSAIDs, medicines for pain and inflammation, like ibuprofen or naproxen  other medicines that prolong the QT interval (an abnormal heart rhythm)  pamidronate  zoledronic acid This list may not describe all possible interactions. Give your health care provider a list of all the medicines, herbs, non-prescription  drugs, or dietary supplements you use. Also tell them if you smoke, drink alcohol, or use illegal drugs. Some items may interact with your medicine. What should I watch for while using  this medicine? Your condition will be monitored carefully while you are receiving this medicine. You may need blood work done while you are taking this medicine. This medicine may make you feel generally unwell. This is not uncommon as chemotherapy can affect healthy cells as well as cancer cells. Report any side effects. Continue your course of treatment even though you feel ill unless your healthcare professional tells you to stop. This medicine can make you more sensitive to cold. Do not drink cold drinks or use ice. Cover exposed skin before coming in contact with cold temperatures or cold objects. When out in cold weather wear warm clothing and cover your mouth and nose to warm the air that goes into your lungs. Tell your doctor if you get sensitive to the cold. Do not become pregnant while taking this medicine or for 9 months after stopping it. Women should inform their health care professional if they wish to become pregnant or think they might be pregnant. Men should not father a child while taking this medicine and for 6 months after stopping it. There is potential for serious side effects to an unborn child. Talk to your health care professional for more information. Do not breast-feed a child while taking this medicine or for 3 months after stopping it. This medicine has caused ovarian failure in some women. This medicine may make it more difficult to get pregnant. Talk to your health care professional if you are concerned about your fertility. This medicine has caused decreased sperm counts in some men. This may make it more difficult to father a child. Talk to your health care professional if you are concerned about your fertility. This medicine may increase your risk of getting an infection. Call your health care professional for advice if you get a fever, chills, or sore throat, or other symptoms of a cold or flu. Do not treat yourself. Try to avoid being around people who are sick. Avoid  taking medicines that contain aspirin, acetaminophen, ibuprofen, naproxen, or ketoprofen unless instructed by your health care professional. These medicines may hide a fever. Be careful brushing or flossing your teeth or using a toothpick because you may get an infection or bleed more easily. If you have any dental work done, tell your dentist you are receiving this medicine. What side effects may I notice from receiving this medicine? Side effects that you should report to your doctor or health care professional as soon as possible:  allergic reactions like skin rash, itching or hives, swelling of the face, lips, or tongue  breathing problems  cough  low blood counts - this medicine may decrease the number of white blood cells, red blood cells, and platelets. You may be at increased risk for infections and bleeding  nausea, vomiting  pain, redness, or irritation at site where injected  pain, tingling, numbness in the hands or feet  signs and symptoms of bleeding such as bloody or black, tarry stools; red or dark brown urine; spitting up blood or brown material that looks like coffee grounds; red spots on the skin; unusual bruising or bleeding from the eyes, gums, or nose  signs and symptoms of a dangerous change in heartbeat or heart rhythm like chest pain; dizziness; fast, irregular heartbeat; palpitations; feeling faint or lightheaded; falls  signs and symptoms  of infection like fever; chills; cough; sore throat; pain or trouble passing urine  signs and symptoms of liver injury like dark yellow or brown urine; general ill feeling or flu-like symptoms; light-colored stools; loss of appetite; nausea; right upper belly pain; unusually weak or tired; yellowing of the eyes or skin  signs and symptoms of low red blood cells or anemia such as unusually weak or tired; feeling faint or lightheaded; falls  signs and symptoms of muscle injury like dark urine; trouble passing urine or change in  the amount of urine; unusually weak or tired; muscle pain; back pain Side effects that usually do not require medical attention (report to your doctor or health care professional if they continue or are bothersome):  changes in taste  diarrhea  gas  hair loss  loss of appetite  mouth sores This list may not describe all possible side effects. Call your doctor for medical advice about side effects. You may report side effects to FDA at 1-800-FDA-1088. Where should I keep my medicine? This drug is given in a hospital or clinic and will not be stored at home. NOTE: This sheet is a summary. It may not cover all possible information. If you have questions about this medicine, talk to your doctor, pharmacist, or health care provider.  2020 Elsevier/Gold Standard (2019-03-09 12:20:35)   Leucovorin injection What is this medicine? LEUCOVORIN (loo koe VOR in) is used to prevent or treat the harmful effects of some medicines. This medicine is used to treat anemia caused by a low amount of folic acid in the body. It is also used with 5-fluorouracil (5-FU) to treat colon cancer. This medicine may be used for other purposes; ask your health care provider or pharmacist if you have questions. What should I tell my health care provider before I take this medicine? They need to know if you have any of these conditions:  anemia from low levels of vitamin B-12 in the blood  an unusual or allergic reaction to leucovorin, folic acid, other medicines, foods, dyes, or preservatives  pregnant or trying to get pregnant  breast-feeding How should I use this medicine? This medicine is for injection into a muscle or into a vein. It is given by a health care professional in a hospital or clinic setting. Talk to your pediatrician regarding the use of this medicine in children. Special care may be needed. Overdosage: If you think you have taken too much of this medicine contact a poison control center or  emergency room at once. NOTE: This medicine is only for you. Do not share this medicine with others. What if I miss a dose? This does not apply. What may interact with this medicine?  capecitabine  fluorouracil  phenobarbital  phenytoin  primidone  trimethoprim-sulfamethoxazole This list may not describe all possible interactions. Give your health care provider a list of all the medicines, herbs, non-prescription drugs, or dietary supplements you use. Also tell them if you smoke, drink alcohol, or use illegal drugs. Some items may interact with your medicine. What should I watch for while using this medicine? Your condition will be monitored carefully while you are receiving this medicine. This medicine may increase the side effects of 5-fluorouracil, 5-FU. Tell your doctor or health care professional if you have diarrhea or mouth sores that do not get better or that get worse. What side effects may I notice from receiving this medicine? Side effects that you should report to your doctor or health care professional as soon  as possible:  allergic reactions like skin rash, itching or hives, swelling of the face, lips, or tongue  breathing problems  fever, infection  mouth sores  unusual bleeding or bruising  unusually weak or tired Side effects that usually do not require medical attention (report to your doctor or health care professional if they continue or are bothersome):  constipation or diarrhea  loss of appetite  nausea, vomiting This list may not describe all possible side effects. Call your doctor for medical advice about side effects. You may report side effects to FDA at 1-800-FDA-1088. Where should I keep my medicine? This drug is given in a hospital or clinic and will not be stored at home. NOTE: This sheet is a summary. It may not cover all possible information. If you have questions about this medicine, talk to your doctor, pharmacist, or health care  provider.  2020 Elsevier/Gold Standard (2008-04-25 16:50:29)   Fluorouracil, 5-FU injection What is this medicine? FLUOROURACIL, 5-FU (flure oh YOOR a sil) is a chemotherapy drug. It slows the growth of cancer cells. This medicine is used to treat many types of cancer like breast cancer, colon or rectal cancer, pancreatic cancer, and stomach cancer. This medicine may be used for other purposes; ask your health care provider or pharmacist if you have questions. COMMON BRAND NAME(S): Adrucil What should I tell my health care provider before I take this medicine? They need to know if you have any of these conditions:  blood disorders  dihydropyrimidine dehydrogenase (DPD) deficiency  infection (especially a virus infection such as chickenpox, cold sores, or herpes)  kidney disease  liver disease  malnourished, poor nutrition  recent or ongoing radiation therapy  an unusual or allergic reaction to fluorouracil, other chemotherapy, other medicines, foods, dyes, or preservatives  pregnant or trying to get pregnant  breast-feeding How should I use this medicine? This drug is given as an infusion or injection into a vein. It is administered in a hospital or clinic by a specially trained health care professional. Talk to your pediatrician regarding the use of this medicine in children. Special care may be needed. Overdosage: If you think you have taken too much of this medicine contact a poison control center or emergency room at once. NOTE: This medicine is only for you. Do not share this medicine with others. What if I miss a dose? It is important not to miss your dose. Call your doctor or health care professional if you are unable to keep an appointment. What may interact with this medicine?  allopurinol  cimetidine  dapsone  digoxin  hydroxyurea  leucovorin  levamisole  medicines for seizures like ethotoin, fosphenytoin, phenytoin  medicines to increase blood  counts like filgrastim, pegfilgrastim, sargramostim  medicines that treat or prevent blood clots like warfarin, enoxaparin, and dalteparin  methotrexate  metronidazole  pyrimethamine  some other chemotherapy drugs like busulfan, cisplatin, estramustine, vinblastine  trimethoprim  trimetrexate  vaccines Talk to your doctor or health care professional before taking any of these medicines:  acetaminophen  aspirin  ibuprofen  ketoprofen  naproxen This list may not describe all possible interactions. Give your health care provider a list of all the medicines, herbs, non-prescription drugs, or dietary supplements you use. Also tell them if you smoke, drink alcohol, or use illegal drugs. Some items may interact with your medicine. What should I watch for while using this medicine? Visit your doctor for checks on your progress. This drug may make you feel generally unwell. This is  not uncommon, as chemotherapy can affect healthy cells as well as cancer cells. Report any side effects. Continue your course of treatment even though you feel ill unless your doctor tells you to stop. In some cases, you may be given additional medicines to help with side effects. Follow all directions for their use. Call your doctor or health care professional for advice if you get a fever, chills or sore throat, or other symptoms of a cold or flu. Do not treat yourself. This drug decreases your body's ability to fight infections. Try to avoid being around people who are sick. This medicine may increase your risk to bruise or bleed. Call your doctor or health care professional if you notice any unusual bleeding. Be careful brushing and flossing your teeth or using a toothpick because you may get an infection or bleed more easily. If you have any dental work done, tell your dentist you are receiving this medicine. Avoid taking products that contain aspirin, acetaminophen, ibuprofen, naproxen, or ketoprofen unless  instructed by your doctor. These medicines may hide a fever. Do not become pregnant while taking this medicine. Women should inform their doctor if they wish to become pregnant or think they might be pregnant. There is a potential for serious side effects to an unborn child. Talk to your health care professional or pharmacist for more information. Do not breast-feed an infant while taking this medicine. Men should inform their doctor if they wish to father a child. This medicine may lower sperm counts. Do not treat diarrhea with over the counter products. Contact your doctor if you have diarrhea that lasts more than 2 days or if it is severe and watery. This medicine can make you more sensitive to the sun. Keep out of the sun. If you cannot avoid being in the sun, wear protective clothing and use sunscreen. Do not use sun lamps or tanning beds/booths. What side effects may I notice from receiving this medicine? Side effects that you should report to your doctor or health care professional as soon as possible:  allergic reactions like skin rash, itching or hives, swelling of the face, lips, or tongue  low blood counts - this medicine may decrease the number of white blood cells, red blood cells and platelets. You may be at increased risk for infections and bleeding.  signs of infection - fever or chills, cough, sore throat, pain or difficulty passing urine  signs of decreased platelets or bleeding - bruising, pinpoint red spots on the skin, black, tarry stools, blood in the urine  signs of decreased red blood cells - unusually weak or tired, fainting spells, lightheadedness  breathing problems  changes in vision  chest pain  mouth sores  nausea and vomiting  pain, swelling, redness at site where injected  pain, tingling, numbness in the hands or feet  redness, swelling, or sores on hands or feet  stomach pain  unusual bleeding Side effects that usually do not require medical  attention (report to your doctor or health care professional if they continue or are bothersome):  changes in finger or toe nails  diarrhea  dry or itchy skin  hair loss  headache  loss of appetite  sensitivity of eyes to the light  stomach upset  unusually teary eyes This list may not describe all possible side effects. Call your doctor for medical advice about side effects. You may report side effects to FDA at 1-800-FDA-1088. Where should I keep my medicine? This drug is given in a  hospital or clinic and will not be stored at home. NOTE: This sheet is a summary. It may not cover all possible information. If you have questions about this medicine, talk to your doctor, pharmacist, or health care provider.  2020 Elsevier/Gold Standard (2008-02-23 13:53:16)

## 2020-10-11 NOTE — Progress Notes (Signed)
Pt tolerated tx well, stable at time of d/c, ambulatory w/belongings and spouse to exit, driving self home.  Pt and his wife were provided with extremely extensive education with step by step instructions for pump management, side effect management, return appt date/time, spill management, f/u information, chemo education, etc.  MD Benay Spice aware that pt/his wife required detailed education and lots of repetition.  RN advised MD that pt may need alternative treatment plan d/t the difficulty he had today understanding/retaining the information that was being given.  MD verbalized awareness.

## 2020-10-11 NOTE — Progress Notes (Signed)
Per Dr. Benay Spice: OK to treat today with total bili 1.9 and pulse 114

## 2020-10-12 ENCOUNTER — Telehealth: Payer: Self-pay | Admitting: *Deleted

## 2020-10-12 NOTE — Telephone Encounter (Signed)
Called pt to see how he did with his treatment yesterday.  He reports doing OK.  Discussed pump & troubleshooting.  He reports pump is doing OK.  He reports knowing how to reach [place] for concerns/questions.

## 2020-10-12 NOTE — Telephone Encounter (Signed)
-----   Message from Arman Bogus, RN sent at 10/11/2020  3:31 PM EST ----- Regarding: 1st time [employer], 1st time folfox, tolerated well

## 2020-10-13 ENCOUNTER — Inpatient Hospital Stay: Payer: Medicare Other

## 2020-10-13 ENCOUNTER — Other Ambulatory Visit: Payer: Self-pay

## 2020-10-13 VITALS — BP 125/90 | HR 110 | Temp 96.0°F | Resp 17

## 2020-10-13 DIAGNOSIS — C189 Malignant neoplasm of colon, unspecified: Secondary | ICD-10-CM

## 2020-10-13 DIAGNOSIS — Z5111 Encounter for antineoplastic chemotherapy: Secondary | ICD-10-CM | POA: Diagnosis not present

## 2020-10-13 DIAGNOSIS — D649 Anemia, unspecified: Secondary | ICD-10-CM | POA: Diagnosis not present

## 2020-10-13 DIAGNOSIS — C787 Secondary malignant neoplasm of liver and intrahepatic bile duct: Secondary | ICD-10-CM | POA: Diagnosis not present

## 2020-10-13 DIAGNOSIS — I4891 Unspecified atrial fibrillation: Secondary | ICD-10-CM | POA: Diagnosis not present

## 2020-10-13 DIAGNOSIS — C184 Malignant neoplasm of transverse colon: Secondary | ICD-10-CM | POA: Diagnosis not present

## 2020-10-13 DIAGNOSIS — D701 Agranulocytosis secondary to cancer chemotherapy: Secondary | ICD-10-CM | POA: Diagnosis not present

## 2020-10-13 MED ORDER — HEPARIN SOD (PORK) LOCK FLUSH 100 UNIT/ML IV SOLN
500.0000 [IU] | Freq: Once | INTRAVENOUS | Status: AC | PRN
  Administered 2020-10-13: 13:00:00 500 [IU]
  Filled 2020-10-13: qty 5

## 2020-10-13 MED ORDER — SODIUM CHLORIDE 0.9% FLUSH
10.0000 mL | INTRAVENOUS | Status: DC | PRN
  Administered 2020-10-13: 13:00:00 10 mL
  Filled 2020-10-13: qty 10

## 2020-10-15 DIAGNOSIS — R5383 Other fatigue: Secondary | ICD-10-CM | POA: Diagnosis not present

## 2020-10-15 DIAGNOSIS — Z7189 Other specified counseling: Secondary | ICD-10-CM | POA: Diagnosis not present

## 2020-10-15 DIAGNOSIS — Z6820 Body mass index (BMI) 20.0-20.9, adult: Secondary | ICD-10-CM | POA: Diagnosis not present

## 2020-10-15 DIAGNOSIS — Z0000 Encounter for general adult medical examination without abnormal findings: Secondary | ICD-10-CM | POA: Diagnosis not present

## 2020-10-15 DIAGNOSIS — Z299 Encounter for prophylactic measures, unspecified: Secondary | ICD-10-CM | POA: Diagnosis not present

## 2020-10-15 DIAGNOSIS — C787 Secondary malignant neoplasm of liver and intrahepatic bile duct: Secondary | ICD-10-CM | POA: Diagnosis not present

## 2020-10-15 DIAGNOSIS — Z1339 Encounter for screening examination for other mental health and behavioral disorders: Secondary | ICD-10-CM | POA: Diagnosis not present

## 2020-10-15 DIAGNOSIS — C189 Malignant neoplasm of colon, unspecified: Secondary | ICD-10-CM | POA: Diagnosis not present

## 2020-10-15 DIAGNOSIS — Z1331 Encounter for screening for depression: Secondary | ICD-10-CM | POA: Diagnosis not present

## 2020-10-16 ENCOUNTER — Encounter: Payer: Self-pay | Admitting: *Deleted

## 2020-10-16 NOTE — Progress Notes (Signed)
Received fax from [facility] that the specimen for testing has been received.

## 2020-10-22 DIAGNOSIS — C189 Malignant neoplasm of colon, unspecified: Secondary | ICD-10-CM | POA: Diagnosis not present

## 2020-10-28 ENCOUNTER — Other Ambulatory Visit: Payer: Self-pay | Admitting: Oncology

## 2020-10-29 ENCOUNTER — Encounter (HOSPITAL_COMMUNITY): Payer: Self-pay | Admitting: Oncology

## 2020-10-31 ENCOUNTER — Inpatient Hospital Stay (HOSPITAL_BASED_OUTPATIENT_CLINIC_OR_DEPARTMENT_OTHER): Payer: Medicare Other | Admitting: Oncology

## 2020-10-31 ENCOUNTER — Inpatient Hospital Stay: Payer: Medicare Other

## 2020-10-31 ENCOUNTER — Other Ambulatory Visit: Payer: Self-pay

## 2020-10-31 ENCOUNTER — Other Ambulatory Visit: Payer: Self-pay | Admitting: *Deleted

## 2020-10-31 VITALS — BP 146/98 | HR 110 | Temp 97.8°F | Resp 18 | Ht 68.0 in | Wt 129.8 lb

## 2020-10-31 DIAGNOSIS — Z5111 Encounter for antineoplastic chemotherapy: Secondary | ICD-10-CM | POA: Diagnosis not present

## 2020-10-31 DIAGNOSIS — C184 Malignant neoplasm of transverse colon: Secondary | ICD-10-CM | POA: Diagnosis not present

## 2020-10-31 DIAGNOSIS — C189 Malignant neoplasm of colon, unspecified: Secondary | ICD-10-CM | POA: Diagnosis not present

## 2020-10-31 DIAGNOSIS — C787 Secondary malignant neoplasm of liver and intrahepatic bile duct: Secondary | ICD-10-CM | POA: Diagnosis not present

## 2020-10-31 DIAGNOSIS — D649 Anemia, unspecified: Secondary | ICD-10-CM | POA: Diagnosis not present

## 2020-10-31 DIAGNOSIS — D701 Agranulocytosis secondary to cancer chemotherapy: Secondary | ICD-10-CM | POA: Diagnosis not present

## 2020-10-31 DIAGNOSIS — I4891 Unspecified atrial fibrillation: Secondary | ICD-10-CM | POA: Diagnosis not present

## 2020-10-31 DIAGNOSIS — Z95828 Presence of other vascular implants and grafts: Secondary | ICD-10-CM

## 2020-10-31 LAB — CMP (CANCER CENTER ONLY)
ALT: 26 U/L (ref 0–44)
AST: 56 U/L — ABNORMAL HIGH (ref 15–41)
Albumin: 2.9 g/dL — ABNORMAL LOW (ref 3.5–5.0)
Alkaline Phosphatase: 121 U/L (ref 38–126)
Anion gap: 6 (ref 5–15)
BUN: 19 mg/dL (ref 8–23)
CO2: 25 mmol/L (ref 22–32)
Calcium: 8.7 mg/dL — ABNORMAL LOW (ref 8.9–10.3)
Chloride: 109 mmol/L (ref 98–111)
Creatinine: 0.98 mg/dL (ref 0.61–1.24)
GFR, Estimated: >60 mL/min (ref >60)
Glucose, Bld: 95 mg/dL (ref 70–99)
Potassium: 4.3 mmol/L (ref 3.5–5.1)
Sodium: 140 mmol/L (ref 135–145)
Total Bilirubin: 1.1 mg/dL (ref 0.3–1.2)
Total Protein: 6.2 g/dL — ABNORMAL LOW (ref 6.5–8.1)

## 2020-10-31 LAB — CBC WITH DIFFERENTIAL (CANCER CENTER ONLY)
Abs Immature Granulocytes: 0.01 10*3/uL (ref 0.00–0.07)
Basophils Absolute: 0 10*3/uL (ref 0.0–0.1)
Basophils Relative: 2 %
Eosinophils Absolute: 0 10*3/uL (ref 0.0–0.5)
Eosinophils Relative: 2 %
HCT: 33 % — ABNORMAL LOW (ref 39.0–52.0)
Hemoglobin: 10.5 g/dL — ABNORMAL LOW (ref 13.0–17.0)
Immature Granulocytes: 0 %
Lymphocytes Relative: 37 %
Lymphs Abs: 0.9 10*3/uL (ref 0.7–4.0)
MCH: 31.2 pg (ref 26.0–34.0)
MCHC: 31.8 g/dL (ref 30.0–36.0)
MCV: 97.9 fL (ref 80.0–100.0)
Monocytes Absolute: 0.7 10*3/uL (ref 0.1–1.0)
Monocytes Relative: 29 %
Neutro Abs: 0.7 10*3/uL — ABNORMAL LOW (ref 1.7–7.7)
Neutrophils Relative %: 30 %
Platelet Count: 220 10*3/uL (ref 150–400)
RBC: 3.37 MIL/uL — ABNORMAL LOW (ref 4.22–5.81)
RDW: 14.9 % (ref 11.5–15.5)
WBC Count: 2.3 10*3/uL — ABNORMAL LOW (ref 4.0–10.5)
nRBC: 0 % (ref 0.0–0.2)

## 2020-10-31 MED ORDER — HEPARIN SOD (PORK) LOCK FLUSH 100 UNIT/ML IV SOLN
500.0000 [IU] | Freq: Once | INTRAVENOUS | Status: AC
  Administered 2020-10-31: 12:00:00 500 [IU] via INTRAVENOUS
  Filled 2020-10-31: qty 5

## 2020-10-31 MED ORDER — SODIUM CHLORIDE 0.9% FLUSH
10.0000 mL | Freq: Once | INTRAVENOUS | Status: AC
  Administered 2020-10-31: 10:00:00 10 mL
  Filled 2020-10-31: qty 10

## 2020-10-31 MED ORDER — SODIUM CHLORIDE 0.9% FLUSH
10.0000 mL | INTRAVENOUS | Status: DC | PRN
  Administered 2020-10-31: 12:00:00 10 mL via INTRAVENOUS
  Filled 2020-10-31: qty 10

## 2020-10-31 NOTE — Progress Notes (Signed)
Patient expressed worry and concern over his current medical situation and how to plan for his wife's care in the future. She has dementia and he needs to make plans for her care. Sent referral for CSW.

## 2020-10-31 NOTE — Progress Notes (Signed)
  [facility] OFFICE PROGRESS NOTE   Diagnosis: Colon cancer  INTERVAL HISTORY:   Dylan York returns as scheduled.  He completed cycle 1 FOLFOX on 10/11/2020.  No nausea/vomiting, mouth sores, or diarrhea.  He had cold sensitivity following chemotherapy.  No other neuropathy symptoms.  He reports a poor appetite.  No other complaint.  Objective:  Vital signs in last 24 hours:  Blood pressure (!) 130/102, pulse (!) 110, temperature 97.8 F (36.6 C), temperature source Tympanic, resp. rate 18, height _0  (1.727 m), weight 129 lb 12.8 oz (58.9 kg), SpO2 99 %.    HEENT: No thrush or ulcers Resp: Lungs clear bilaterally Cardio: Irregular, 2/6 systolic murmur GI: The liver is palpable in the right upper abdomen, nontender, soft reducible left inguinal hernia Vascular: No leg edema   Portacath/PICC-without erythema  Lab Results:  Lab Results  Component Value Date   WBC 2.3 (L) 10/31/2020   HGB 10.5 (L) 10/31/2020   HCT 33.0 (L) 10/31/2020   MCV 97.9 10/31/2020   PLT 220 10/31/2020   NEUTROABS 0.7 (L) 10/31/2020    CMP  Lab Results  Component Value Date   NA 140 10/31/2020   K 4.3 10/31/2020   CL 109 10/31/2020   CO2 25 10/31/2020   GLUCOSE 95 10/31/2020   BUN 19 10/31/2020   CREATININE 0.98 10/31/2020   CALCIUM 8.7 (L) 10/31/2020   PROT 6.2 (L) 10/31/2020   ALBUMIN 2.9 (L) 10/31/2020   AST 56 (H) 10/31/2020   ALT 26 10/31/2020   ALKPHOS 121 10/31/2020   BILITOT 1.1 10/31/2020   GFRNONAA >60 10/31/2020   GFRAA 54 (L) 08/02/2020    Lab Results  Component Value Date   CEA1 1,058.81 (H) 10/11/2020    Medications: I have reviewed the patient's current medications.   Assessment/Plan: 1. Colon cancer-transverse, stage IIIb (T3N1), status post a segmental colectomy 04/13/2020 ? 1/10 lymph nodes, lymphovascular invasion present, MSS, resection margins negative for invasive cancer, "closest" margin positive for low-grade dysplasia ? Colonoscopy  02/09/2020-ulcerated nonobstructing mass at the splenic flexure, 4 mm polyp of the rectum, splenic flexure lesion-adenocarcinoma with intact mismatch repair protein expression, tubular adenoma at the rectal polyp ? CT abdomen/pelvis 02/16/2020-apple core lesion at the distal transverse colon, no evidence of metastatic disease ? CT chest 03/06/2020-no evidence of metastatic disease, stable benign lung nodules ? CT abdomen/pelvis 09/20/2020-interval development of widespread metastatic disease to the liver with multiple hypervascular hepatic masses, largest measuring 8.6 x 6.2 x 6.2 cm. Enlarged gastrohepatic ligament lymph node. ? Cycle 1 FOLFOX 10/11/2020 2. Rectal bleeding secondary to #1 3. Anemia-likely secondary to GI bleeding 4. Atrial fibrillation, maintained on apixaban 5. BPH 6. Glaucoma 7. Hypertension 8. Hypothyroid 9. History of a CVA 10. Positive resection margin for low-grade dysplasia on the colectomy specimen 04/13/2020 11. Neutropenia following cycle 1 FOLFOX, Udenyca added with cycle 2    Disposition: [name] tolerated the first cycle of chemotherapy well.  He has developed neutropenia.  Chemotherapy will be held today.  He knows to call for a fever or symptoms of an infection.  G-CSF will be added with cycle 2.  We reviewed potential toxicities associated with G-CSF including the chance of bone pain, rash, and splenic rupture.  He agrees to proceed.  He will return for an office visit prior to cycle 2 chemotherapy on 11/08/2019.  Betsy Coder, MD  10/31/2020  11:25 AM

## 2020-10-31 NOTE — Patient Instructions (Signed)

## 2020-11-01 ENCOUNTER — Telehealth: Payer: Self-pay | Admitting: Oncology

## 2020-11-01 ENCOUNTER — Telehealth: Payer: Self-pay | Admitting: *Deleted

## 2020-11-01 NOTE — Telephone Encounter (Signed)
[facility] Clinical Social Work  Initial Assessment   Dylan York is a 86 y.o. year old male. Clinical Social Work was referred by medical oncology for assessment of psychosocial needs.   SDOH (Social Determinants of Health) assessments performed: No @SDOHINTERVENTIONS @   Distress Screen completed: Yes ONCBCN DISTRESS SCREENING 10/08/2020  Screening Type Initial Screening  Distress experienced in past week (1-10) 6  Practical problem type Food  Emotional problem type Adjusting to illness  Information Concerns Type Lack of info about treatment  Physical Problem type Mouth sores/swallowing;Constipation/diarrhea    Family/Social Information:  . Housing Arrangement: patient lives with spouse, 14/04/2020 . Family members/support persons in your life? Supportive Friends/Colleagues and neighbors.  Patient had a neighbor visit at time of phone visit.  He identified extremely supportive neighbors that help patient/spouse if needed (including picking up mail, running errands, etc). Patient's daughter lives in [place]- she has set up weekly home cleaning and limited home care.  CSW encouraged patient to consider utilizing this service/team for more hours if his or his spouse's needs change. . Transportation concerns: no, patient is currently driving  . Employment: Retired. Income source: [place] . Financial concerns: No o Type of concern: None . Food access concerns: no, patient receives Meals on Wheels  . Religious or spiritual practice: yes . Medication Concerns: no  . Services Currently in place:  Meals on Wheels,   Coping/ Adjustment to diagnosis: . Patient understands treatment plan and what happens next? yes . Concerns about diagnosis and/or treatment: How I will care for other members of my family and anticipating needs that may arise through treatment . 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 Hopes and priorities: Patient's priority is completing treatment and managing side  effects.  Patient has anxiety regarding uncertainty of cancer outcome, but states he feels he can manage this day by day . Patient enjoys time with family/ friends . Current coping skills/ strengths: Ability for insight, Average or above average intelligence, Communication skills, General fund of knowledge, Religious Affiliation and Supportive family/friends    SUMMARY: Current SDOH Barriers:  . Level of care concerns and Inability to perform ADL's independently  Clinical Social Work Clinical Goal(s):  Programme researcher, broadcasting/film/video None identified at this time  Interventions: . Discussed common feeling and emotions when being diagnosed with cancer, and the importance of support during treatment . Informed patient of the support team roles and support services at [facility] . Provided CSW contact information and encouraged patient to call with any questions or concerns . Provided patient with information about home care services through supplemental insurance- patient agreed to contact [employer] supplemental plan and determine services available through insurance plan   Follow Up Plan: Patient will follow up with CSW team as needed Patient verbalizes understanding of plan: Yes    [facility] , LCSW

## 2020-11-01 NOTE — Telephone Encounter (Signed)
Scheduled appointments per 12/29 los. Called patient, no answer. Left message with appointments dates and times. Had to schedule two different days because those were the only times available. Okay per Lonna Cobb.

## 2020-11-02 ENCOUNTER — Inpatient Hospital Stay: Payer: Medicare Other

## 2020-11-09 ENCOUNTER — Encounter: Payer: Self-pay | Admitting: Nurse Practitioner

## 2020-11-09 ENCOUNTER — Inpatient Hospital Stay (HOSPITAL_BASED_OUTPATIENT_CLINIC_OR_DEPARTMENT_OTHER): Payer: Medicare Other | Admitting: Nurse Practitioner

## 2020-11-09 ENCOUNTER — Other Ambulatory Visit: Payer: Self-pay

## 2020-11-09 ENCOUNTER — Inpatient Hospital Stay: Payer: Medicare Other | Attending: Oncology

## 2020-11-09 ENCOUNTER — Inpatient Hospital Stay: Payer: Medicare Other

## 2020-11-09 VITALS — BP 126/101 | HR 92 | Temp 98.1°F | Resp 18 | Ht 68.0 in | Wt 127.8 lb

## 2020-11-09 DIAGNOSIS — Z23 Encounter for immunization: Secondary | ICD-10-CM | POA: Insufficient documentation

## 2020-11-09 DIAGNOSIS — I4891 Unspecified atrial fibrillation: Secondary | ICD-10-CM | POA: Insufficient documentation

## 2020-11-09 DIAGNOSIS — I10 Essential (primary) hypertension: Secondary | ICD-10-CM | POA: Diagnosis not present

## 2020-11-09 DIAGNOSIS — C189 Malignant neoplasm of colon, unspecified: Secondary | ICD-10-CM

## 2020-11-09 DIAGNOSIS — Z79899 Other long term (current) drug therapy: Secondary | ICD-10-CM | POA: Insufficient documentation

## 2020-11-09 DIAGNOSIS — Z5189 Encounter for other specified aftercare: Secondary | ICD-10-CM | POA: Diagnosis not present

## 2020-11-09 DIAGNOSIS — D649 Anemia, unspecified: Secondary | ICD-10-CM | POA: Insufficient documentation

## 2020-11-09 DIAGNOSIS — Z5111 Encounter for antineoplastic chemotherapy: Secondary | ICD-10-CM | POA: Diagnosis not present

## 2020-11-09 DIAGNOSIS — Z8673 Personal history of transient ischemic attack (TIA), and cerebral infarction without residual deficits: Secondary | ICD-10-CM | POA: Insufficient documentation

## 2020-11-09 DIAGNOSIS — Z95828 Presence of other vascular implants and grafts: Secondary | ICD-10-CM

## 2020-11-09 DIAGNOSIS — C787 Secondary malignant neoplasm of liver and intrahepatic bile duct: Secondary | ICD-10-CM

## 2020-11-09 DIAGNOSIS — E039 Hypothyroidism, unspecified: Secondary | ICD-10-CM | POA: Insufficient documentation

## 2020-11-09 DIAGNOSIS — D701 Agranulocytosis secondary to cancer chemotherapy: Secondary | ICD-10-CM | POA: Insufficient documentation

## 2020-11-09 DIAGNOSIS — C184 Malignant neoplasm of transverse colon: Secondary | ICD-10-CM | POA: Insufficient documentation

## 2020-11-09 DIAGNOSIS — Z7901 Long term (current) use of anticoagulants: Secondary | ICD-10-CM | POA: Diagnosis not present

## 2020-11-09 LAB — CBC WITH DIFFERENTIAL (CANCER CENTER ONLY)
Abs Immature Granulocytes: 0.02 10*3/uL (ref 0.00–0.07)
Basophils Absolute: 0.1 10*3/uL (ref 0.0–0.1)
Basophils Relative: 1 %
Eosinophils Absolute: 0.1 10*3/uL (ref 0.0–0.5)
Eosinophils Relative: 2 %
HCT: 34.1 % — ABNORMAL LOW (ref 39.0–52.0)
Hemoglobin: 10.9 g/dL — ABNORMAL LOW (ref 13.0–17.0)
Immature Granulocytes: 0 %
Lymphocytes Relative: 17 %
Lymphs Abs: 1 10*3/uL (ref 0.7–4.0)
MCH: 31.1 pg (ref 26.0–34.0)
MCHC: 32 g/dL (ref 30.0–36.0)
MCV: 97.4 fL (ref 80.0–100.0)
Monocytes Absolute: 0.6 10*3/uL (ref 0.1–1.0)
Monocytes Relative: 10 %
Neutro Abs: 4.1 10*3/uL (ref 1.7–7.7)
Neutrophils Relative %: 70 %
Platelet Count: 138 10*3/uL — ABNORMAL LOW (ref 150–400)
RBC: 3.5 MIL/uL — ABNORMAL LOW (ref 4.22–5.81)
RDW: 15 % (ref 11.5–15.5)
WBC Count: 6 10*3/uL (ref 4.0–10.5)
nRBC: 0 % (ref 0.0–0.2)

## 2020-11-09 LAB — CMP (CANCER CENTER ONLY)
ALT: 19 U/L (ref 0–44)
AST: 55 U/L — ABNORMAL HIGH (ref 15–41)
Albumin: 3 g/dL — ABNORMAL LOW (ref 3.5–5.0)
Alkaline Phosphatase: 125 U/L (ref 38–126)
Anion gap: 8 (ref 5–15)
BUN: 21 mg/dL (ref 8–23)
CO2: 20 mmol/L — ABNORMAL LOW (ref 22–32)
Calcium: 9 mg/dL (ref 8.9–10.3)
Chloride: 109 mmol/L (ref 98–111)
Creatinine: 0.98 mg/dL (ref 0.61–1.24)
GFR, Estimated: >60 mL/min (ref >60)
Glucose, Bld: 97 mg/dL (ref 70–99)
Potassium: 4.2 mmol/L (ref 3.5–5.1)
Sodium: 137 mmol/L (ref 135–145)
Total Bilirubin: 1.8 mg/dL — ABNORMAL HIGH (ref 0.3–1.2)
Total Protein: 6.5 g/dL (ref 6.5–8.1)

## 2020-11-09 MED ORDER — HEPARIN SOD (PORK) LOCK FLUSH 100 UNIT/ML IV SOLN
500.0000 [IU] | Freq: Once | INTRAVENOUS | Status: AC
  Administered 2020-11-09: 500 [IU] via INTRAVENOUS
  Filled 2020-11-09: qty 5

## 2020-11-09 MED ORDER — SODIUM CHLORIDE 0.9% FLUSH
10.0000 mL | INTRAVENOUS | Status: DC | PRN
  Administered 2020-11-09: 10 mL via INTRAVENOUS
  Filled 2020-11-09: qty 10

## 2020-11-09 NOTE — Progress Notes (Addendum)
  Dylan York OFFICE PROGRESS NOTE   Diagnosis: Colon cancer  INTERVAL HISTORY:   Dylan York returns as scheduled.  He completed cycle 1 FOLFOX 10/11/2020.  Cycle 2 was held on 10/31/2020 due to neutropenia.  He denies nausea/vomiting.  No mouth sores.  No diarrhea.  He is unsure about cold sensitivity.  He denies pain.  He continues to be fatigued.  He reports his wife has a significant cough and is currently on a Z-Pak.  He is not aware of Covid testing.  He denies fever, cough, shortness of breath.  He reports a "runny nose" for 3 months.  Objective:  Vital signs in last 24 hours:  Blood pressure (!) 126/101, pulse 92, temperature 98.1 F (36.7 C), temperature source Tympanic, resp. rate 18, height $RemoveBe'5\' 8"'iKesrGkXt$  (1.727 m), weight 127 lb 12.8 oz (58 kg), SpO2 99 %.    HEENT: No thrush or ulcers. Resp: Lungs clear bilaterally. Cardio: Irregular. GI: Liver palpable right upper abdomen, nontender. Vascular: No leg edema. Skin: Palms without erythema. Port-A-Cath without erythema.   Lab Results:  Lab Results  Component Value Date   WBC 6.0 11/09/2020   HGB 10.9 (L) 11/09/2020   HCT 34.1 (L) 11/09/2020   MCV 97.4 11/09/2020   PLT 138 (L) 11/09/2020   NEUTROABS 4.1 11/09/2020    Imaging:  No results found.  Medications: I have reviewed the patient's current medications.  Assessment/Plan: 1. Colon cancer-transverse, stage IIIb (T3N1), status post a segmental colectomy 04/13/2020 ? 1/10 lymph nodes, lymphovascular invasion present, MSS, resection margins negative for invasive cancer, "closest" margin positive for low-grade dysplasia; microsatellite stable, tumor mutation burden 3, KRAS G12D ? Colonoscopy 02/09/2020-ulcerated nonobstructing mass at the splenic flexure, 4 mm polyp of the rectum, splenic flexure lesion-adenocarcinoma with intact mismatch repair protein expression, tubular adenoma at the rectal polyp ? CT abdomen/pelvis 02/16/2020-apple core lesion at the distal  transverse colon, no evidence of metastatic disease ? CT chest 03/06/2020-no evidence of metastatic disease, stable benign lung nodules ? CT abdomen/pelvis 09/20/2020-interval development of widespread metastatic disease to the liver with multiple hypervascular hepatic masses, largest measuring 8.6 x 6.2 x 6.2 cm. Enlarged gastrohepatic ligament lymph node. ? Cycle 1 FOLFOX 10/11/2020 ? Cycle 2 FOLFOX 11/10/2020, Udenyca 2. Rectal bleeding secondary to #1 3. Anemia-likely secondary to GI bleeding 4. Atrial fibrillation, maintained on apixaban 5. BPH 6. Glaucoma 7. Hypertension 8. Hypothyroid 9. History of a CVA 10. Positive resection margin for low-grade dysplasia on the colectomy specimen 04/13/2020 11. Neutropenia following cycle 1 FOLFOX, Udenyca added with cycle 2   Disposition: Dylan York appears stable.  He has completed 1 cycle of FOLFOX.  Cycle 2 was held due to neutropenia.  We reviewed the CBC from today.  Counts are adequate to proceed with treatment.  He will return for cycle 2 FOLFOX 11/10/2020, Udenyca on day of pump discontinuation.  He agrees with this plan.  He will return for lab, follow-up, cycle 3 FOLFOX on 11/26/2020.  He will contact the office in the interim with any problems.  I recommended he obtain Covid testing for his wife.  Ned Card ANP/GNP-BC   11/09/2020  12:25 PM

## 2020-11-10 ENCOUNTER — Inpatient Hospital Stay: Payer: Medicare Other

## 2020-11-10 VITALS — BP 126/97 | HR 87 | Temp 97.6°F | Resp 18

## 2020-11-10 DIAGNOSIS — D701 Agranulocytosis secondary to cancer chemotherapy: Secondary | ICD-10-CM | POA: Diagnosis not present

## 2020-11-10 DIAGNOSIS — Z23 Encounter for immunization: Secondary | ICD-10-CM | POA: Diagnosis not present

## 2020-11-10 DIAGNOSIS — Z5111 Encounter for antineoplastic chemotherapy: Secondary | ICD-10-CM | POA: Diagnosis not present

## 2020-11-10 DIAGNOSIS — C189 Malignant neoplasm of colon, unspecified: Secondary | ICD-10-CM

## 2020-11-10 DIAGNOSIS — C787 Secondary malignant neoplasm of liver and intrahepatic bile duct: Secondary | ICD-10-CM | POA: Diagnosis not present

## 2020-11-10 DIAGNOSIS — C184 Malignant neoplasm of transverse colon: Secondary | ICD-10-CM | POA: Diagnosis not present

## 2020-11-10 DIAGNOSIS — Z5189 Encounter for other specified aftercare: Secondary | ICD-10-CM | POA: Diagnosis not present

## 2020-11-10 MED ORDER — PALONOSETRON HCL INJECTION 0.25 MG/5ML
0.2500 mg | Freq: Once | INTRAVENOUS | Status: AC
  Administered 2020-11-10: 0.25 mg via INTRAVENOUS

## 2020-11-10 MED ORDER — OXALIPLATIN CHEMO INJECTION 100 MG/20ML
85.0000 mg/m2 | Freq: Once | INTRAVENOUS | Status: AC
  Administered 2020-11-10: 145 mg via INTRAVENOUS
  Filled 2020-11-10: qty 20

## 2020-11-10 MED ORDER — SODIUM CHLORIDE 0.9 % IV SOLN
10.0000 mg | Freq: Once | INTRAVENOUS | Status: AC
  Administered 2020-11-10: 10 mg via INTRAVENOUS
  Filled 2020-11-10: qty 10
  Filled 2020-11-10: qty 1

## 2020-11-10 MED ORDER — PALONOSETRON HCL INJECTION 0.25 MG/5ML
INTRAVENOUS | Status: AC
  Filled 2020-11-10: qty 5

## 2020-11-10 MED ORDER — INFLUENZA VAC A&B SA ADJ QUAD 0.5 ML IM PRSY
0.5000 mL | PREFILLED_SYRINGE | Freq: Once | INTRAMUSCULAR | Status: AC
  Administered 2020-11-10: 0.5 mL via INTRAMUSCULAR

## 2020-11-10 MED ORDER — FLUOROURACIL CHEMO INJECTION 5 GM/100ML
2000.0000 mg/m2 | INTRAVENOUS | Status: DC
  Administered 2020-11-10: 3400 mg via INTRAVENOUS
  Filled 2020-11-10: qty 68

## 2020-11-10 MED ORDER — LEUCOVORIN CALCIUM INJECTION 350 MG
400.0000 mg/m2 | Freq: Once | INTRAVENOUS | Status: AC
  Administered 2020-11-10: 680 mg via INTRAVENOUS
  Filled 2020-11-10: qty 34

## 2020-11-10 MED ORDER — DEXTROSE 5 % IV SOLN
Freq: Once | INTRAVENOUS | Status: AC
  Filled 2020-11-10: qty 250

## 2020-11-10 MED ORDER — INFLUENZA VAC A&B SA ADJ QUAD 0.5 ML IM PRSY
PREFILLED_SYRINGE | INTRAMUSCULAR | Status: AC
  Filled 2020-11-10: qty 0.5

## 2020-11-10 NOTE — Progress Notes (Signed)
Mr. Roselle tolerated his FOLFOX treatment today without any complications. Per Wilber Bihari, NP ok to treat with a bilirubin of 1.8 today.

## 2020-11-12 ENCOUNTER — Other Ambulatory Visit: Payer: Self-pay

## 2020-11-12 ENCOUNTER — Telehealth: Payer: Self-pay | Admitting: Nurse Practitioner

## 2020-11-12 ENCOUNTER — Inpatient Hospital Stay: Payer: Medicare Other

## 2020-11-12 VITALS — BP 158/82 | HR 68 | Resp 19

## 2020-11-12 DIAGNOSIS — Z5189 Encounter for other specified aftercare: Secondary | ICD-10-CM | POA: Diagnosis not present

## 2020-11-12 DIAGNOSIS — Z23 Encounter for immunization: Secondary | ICD-10-CM | POA: Diagnosis not present

## 2020-11-12 DIAGNOSIS — D701 Agranulocytosis secondary to cancer chemotherapy: Secondary | ICD-10-CM | POA: Diagnosis not present

## 2020-11-12 DIAGNOSIS — C189 Malignant neoplasm of colon, unspecified: Secondary | ICD-10-CM

## 2020-11-12 DIAGNOSIS — C184 Malignant neoplasm of transverse colon: Secondary | ICD-10-CM | POA: Diagnosis not present

## 2020-11-12 DIAGNOSIS — C787 Secondary malignant neoplasm of liver and intrahepatic bile duct: Secondary | ICD-10-CM | POA: Diagnosis not present

## 2020-11-12 DIAGNOSIS — Z5111 Encounter for antineoplastic chemotherapy: Secondary | ICD-10-CM | POA: Diagnosis not present

## 2020-11-12 MED ORDER — PEGFILGRASTIM-CBQV 6 MG/0.6ML ~~LOC~~ SOSY
6.0000 mg | PREFILLED_SYRINGE | Freq: Once | SUBCUTANEOUS | Status: AC
  Administered 2020-11-12: 6 mg via SUBCUTANEOUS

## 2020-11-12 MED ORDER — SODIUM CHLORIDE 0.9% FLUSH
10.0000 mL | INTRAVENOUS | Status: DC | PRN
  Administered 2020-11-12: 10 mL
  Filled 2020-11-12: qty 10

## 2020-11-12 MED ORDER — HEPARIN SOD (PORK) LOCK FLUSH 100 UNIT/ML IV SOLN
500.0000 [IU] | Freq: Once | INTRAVENOUS | Status: AC | PRN
  Administered 2020-11-12: 500 [IU]
  Filled 2020-11-12: qty 5

## 2020-11-12 MED ORDER — PEGFILGRASTIM-CBQV 6 MG/0.6ML ~~LOC~~ SOSY
PREFILLED_SYRINGE | SUBCUTANEOUS | Status: AC
  Filled 2020-11-12: qty 0.6

## 2020-11-12 NOTE — Telephone Encounter (Signed)
Scheduled appointments per 1/7 los. Spoke to patient's wife who is aware of appointments dates and times.

## 2020-11-12 NOTE — Patient Instructions (Signed)
Pegfilgrastim injection What is this medicine? PEGFILGRASTIM (PEG fil gra stim) is a long-acting granulocyte colony-stimulating factor that stimulates the growth of neutrophils, a type of white blood cell important in the body's fight against infection. It is used to reduce the incidence of fever and infection in patients with certain types of cancer who are receiving chemotherapy that affects the bone marrow, and to increase survival after being exposed to high doses of radiation. This medicine may be used for other purposes; ask your health care provider or pharmacist if you have questions. COMMON BRAND NAME(S): Fulphila, Neulasta, Nyvepria, UDENYCA, Ziextenzo What should I tell my health care provider before I take this medicine? They need to know if you have any of these conditions:  kidney disease  latex allergy  ongoing radiation therapy  sickle cell disease  skin reactions to acrylic adhesives (On-Body Injector only)  an unusual or allergic reaction to pegfilgrastim, filgrastim, other medicines, foods, dyes, or preservatives  pregnant or trying to get pregnant  breast-feeding How should I use this medicine? This medicine is for injection under the skin. If you get this medicine at home, you will be taught how to prepare and give the pre-filled syringe or how to use the On-body Injector. Refer to the patient Instructions for Use for detailed instructions. Use exactly as directed. Tell your healthcare provider immediately if you suspect that the On-body Injector may not have performed as intended or if you suspect the use of the On-body Injector resulted in a missed or partial dose. It is important that you put your used needles and syringes in a special sharps container. Do not put them in a trash can. If you do not have a sharps container, call your pharmacist or healthcare provider to get one. Talk to your pediatrician regarding the use of this medicine in children. While this drug  may be prescribed for selected conditions, precautions do apply. Overdosage: If you think you have taken too much of this medicine contact a poison control center or emergency room at once. NOTE: This medicine is only for you. Do not share this medicine with others. What if I miss a dose? It is important not to miss your dose. Call your doctor or health care professional if you miss your dose. If you miss a dose due to an On-body Injector failure or leakage, a new dose should be administered as soon as possible using a single prefilled syringe for manual use. What may interact with this medicine? Interactions have not been studied. This list may not describe all possible interactions. Give your health care provider a list of all the medicines, herbs, non-prescription drugs, or dietary supplements you use. Also tell them if you smoke, drink alcohol, or use illegal drugs. Some items may interact with your medicine. What should I watch for while using this medicine? Your condition will be monitored carefully while you are receiving this medicine. You may need blood work done while you are taking this medicine. Talk to your health care provider about your risk of cancer. You may be more at risk for certain types of cancer if you take this medicine. If you are going to need a MRI, CT scan, or other procedure, tell your doctor that you are using this medicine (On-Body Injector only). What side effects may I notice from receiving this medicine? Side effects that you should report to your doctor or health care professional as soon as possible:  allergic reactions (skin rash, itching or hives, swelling of   the face, lips, or tongue)  back pain  dizziness  fever  pain, redness, or irritation at site where injected  pinpoint red spots on the skin  red or dark-brown urine  shortness of breath or breathing problems  stomach or side pain, or pain at the shoulder  swelling  tiredness  trouble  passing urine or change in the amount of urine  unusual bruising or bleeding Side effects that usually do not require medical attention (report to your doctor or health care professional if they continue or are bothersome):  bone pain  muscle pain This list may not describe all possible side effects. Call your doctor for medical advice about side effects. You may report side effects to FDA at 1-800-FDA-1088. Where should I keep my medicine? Keep out of the reach of children. If you are using this medicine at home, you will be instructed on how to store it. Throw away any unused medicine after the expiration date on the label. NOTE: This sheet is a summary. It may not cover all possible information. If you have questions about this medicine, talk to your doctor, pharmacist, or health care provider.  2021 Elsevier/Gold Standard (2019-11-11 13:20:51)  

## 2020-11-15 ENCOUNTER — Ambulatory Visit: Payer: Medicare Other | Admitting: Nurse Practitioner

## 2020-11-15 ENCOUNTER — Other Ambulatory Visit: Payer: Medicare Other

## 2020-11-15 ENCOUNTER — Ambulatory Visit: Payer: Medicare Other

## 2020-11-18 ENCOUNTER — Other Ambulatory Visit: Payer: Self-pay | Admitting: Oncology

## 2020-11-21 ENCOUNTER — Ambulatory Visit: Payer: Medicare Other

## 2020-11-21 ENCOUNTER — Other Ambulatory Visit: Payer: Medicare Other

## 2020-11-21 ENCOUNTER — Ambulatory Visit: Payer: Medicare Other | Admitting: Nurse Practitioner

## 2020-11-22 ENCOUNTER — Ambulatory Visit (INDEPENDENT_AMBULATORY_CARE_PROVIDER_SITE_OTHER): Payer: Medicare Other | Admitting: Gastroenterology

## 2020-11-26 ENCOUNTER — Inpatient Hospital Stay: Payer: Medicare Other

## 2020-11-26 ENCOUNTER — Other Ambulatory Visit: Payer: Self-pay

## 2020-11-26 ENCOUNTER — Inpatient Hospital Stay (HOSPITAL_BASED_OUTPATIENT_CLINIC_OR_DEPARTMENT_OTHER): Payer: Medicare Other | Admitting: Oncology

## 2020-11-26 VITALS — BP 140/96 | HR 100 | Temp 96.8°F | Resp 20 | Wt 126.5 lb

## 2020-11-26 DIAGNOSIS — C787 Secondary malignant neoplasm of liver and intrahepatic bile duct: Secondary | ICD-10-CM

## 2020-11-26 DIAGNOSIS — C189 Malignant neoplasm of colon, unspecified: Secondary | ICD-10-CM | POA: Diagnosis not present

## 2020-11-26 DIAGNOSIS — C184 Malignant neoplasm of transverse colon: Secondary | ICD-10-CM | POA: Diagnosis not present

## 2020-11-26 DIAGNOSIS — Z23 Encounter for immunization: Secondary | ICD-10-CM | POA: Diagnosis not present

## 2020-11-26 DIAGNOSIS — D701 Agranulocytosis secondary to cancer chemotherapy: Secondary | ICD-10-CM | POA: Diagnosis not present

## 2020-11-26 DIAGNOSIS — Z5189 Encounter for other specified aftercare: Secondary | ICD-10-CM | POA: Diagnosis not present

## 2020-11-26 DIAGNOSIS — Z5111 Encounter for antineoplastic chemotherapy: Secondary | ICD-10-CM | POA: Diagnosis not present

## 2020-11-26 LAB — CBC WITH DIFFERENTIAL (CANCER CENTER ONLY)
Abs Immature Granulocytes: 0.82 10*3/uL — ABNORMAL HIGH (ref 0.00–0.07)
Basophils Absolute: 0 10*3/uL (ref 0.0–0.1)
Basophils Relative: 0 %
Eosinophils Absolute: 0 10*3/uL (ref 0.0–0.5)
Eosinophils Relative: 0 %
HCT: 32.1 % — ABNORMAL LOW (ref 39.0–52.0)
Hemoglobin: 10.5 g/dL — ABNORMAL LOW (ref 13.0–17.0)
Immature Granulocytes: 8 %
Lymphocytes Relative: 11 %
Lymphs Abs: 1.2 10*3/uL (ref 0.7–4.0)
MCH: 31.8 pg (ref 26.0–34.0)
MCHC: 32.7 g/dL (ref 30.0–36.0)
MCV: 97.3 fL (ref 80.0–100.0)
Monocytes Absolute: 1 10*3/uL (ref 0.1–1.0)
Monocytes Relative: 10 %
Neutro Abs: 7.5 10*3/uL (ref 1.7–7.7)
Neutrophils Relative %: 71 %
Platelet Count: 90 10*3/uL — ABNORMAL LOW (ref 150–400)
RBC: 3.3 MIL/uL — ABNORMAL LOW (ref 4.22–5.81)
RDW: 16.6 % — ABNORMAL HIGH (ref 11.5–15.5)
WBC Count: 10.6 10*3/uL — ABNORMAL HIGH (ref 4.0–10.5)
nRBC: 0.2 % (ref 0.0–0.2)

## 2020-11-26 LAB — CMP (CANCER CENTER ONLY)
ALT: 15 U/L (ref 0–44)
AST: 42 U/L — ABNORMAL HIGH (ref 15–41)
Albumin: 3 g/dL — ABNORMAL LOW (ref 3.5–5.0)
Alkaline Phosphatase: 120 U/L (ref 38–126)
Anion gap: 8 (ref 5–15)
BUN: 15 mg/dL (ref 8–23)
CO2: 23 mmol/L (ref 22–32)
Calcium: 8.6 mg/dL — ABNORMAL LOW (ref 8.9–10.3)
Chloride: 109 mmol/L (ref 98–111)
Creatinine: 1.1 mg/dL (ref 0.61–1.24)
GFR, Estimated: >60 mL/min (ref >60)
Glucose, Bld: 105 mg/dL — ABNORMAL HIGH (ref 70–99)
Potassium: 4 mmol/L (ref 3.5–5.1)
Sodium: 140 mmol/L (ref 135–145)
Total Bilirubin: 1.7 mg/dL — ABNORMAL HIGH (ref 0.3–1.2)
Total Protein: 6 g/dL — ABNORMAL LOW (ref 6.5–8.1)

## 2020-11-26 LAB — CEA (IN HOUSE-CHCC): CEA (CHCC-In House): 3064.74 ng/mL — ABNORMAL HIGH (ref 0.00–5.00)

## 2020-11-26 MED ORDER — LEUCOVORIN CALCIUM INJECTION 350 MG
400.0000 mg/m2 | Freq: Once | INTRAVENOUS | Status: AC
  Administered 2020-11-26: 680 mg via INTRAVENOUS
  Filled 2020-11-26: qty 34

## 2020-11-26 MED ORDER — SODIUM CHLORIDE 0.9 % IV SOLN
10.0000 mg | Freq: Once | INTRAVENOUS | Status: AC
  Administered 2020-11-26: 10 mg via INTRAVENOUS
  Filled 2020-11-26: qty 10

## 2020-11-26 MED ORDER — DEXTROSE 5 % IV SOLN
65.0000 mg/m2 | Freq: Once | INTRAVENOUS | Status: AC
  Administered 2020-11-26: 110 mg via INTRAVENOUS
  Filled 2020-11-26: qty 22

## 2020-11-26 MED ORDER — PALONOSETRON HCL INJECTION 0.25 MG/5ML
INTRAVENOUS | Status: AC
  Filled 2020-11-26: qty 5

## 2020-11-26 MED ORDER — PALONOSETRON HCL INJECTION 0.25 MG/5ML
0.2500 mg | Freq: Once | INTRAVENOUS | Status: AC
  Administered 2020-11-26: 0.25 mg via INTRAVENOUS

## 2020-11-26 MED ORDER — SODIUM CHLORIDE 0.9 % IV SOLN
2000.0000 mg/m2 | INTRAVENOUS | Status: DC
  Administered 2020-11-26: 3400 mg via INTRAVENOUS
  Filled 2020-11-26: qty 68

## 2020-11-26 MED ORDER — DEXTROSE 5 % IV SOLN
Freq: Once | INTRAVENOUS | Status: AC
  Filled 2020-11-26: qty 250

## 2020-11-26 NOTE — Progress Notes (Addendum)
[facility] OFFICE PROGRESS NOTE   Diagnosis: Colon cancer  INTERVAL HISTORY:   [name] completed another cycle of FOLFOX on 11/10/2020.  He reports malaise following chemotherapy.  No nausea/vomiting or mouth sores.  He had an episode of diarrhea after taking a laxative.  He has persistent cold sensitivity.  He felt "dizzy "1 day last week and had systolic blood pressure readings of 80 and 65 on a home monitor.  The blood pressure was subsequently higher the same day.  No bone pain following G-CSF.  Objective:  Vital signs in last 24 hours:  Blood pressure (!) 140/96, pulse 100, temperature (!) 96.8 F (36 C), resp. rate 20, weight 126 lb 8 oz (57.4 kg), SpO2 100 %.    HEENT: No thrush or ulcers Resp: Lungs clear bilaterally Cardio: Irregular GI: The liver is palpable in the right upper abdomen, nontender Vascular: No leg edema  Skin: Palms without erythema  Portacath/PICC-without erythema  Lab Results:  Lab Results  Component Value Date   WBC 10.6 (H) 11/26/2020   HGB 10.5 (L) 11/26/2020   HCT 32.1 (L) 11/26/2020   MCV 97.3 11/26/2020   PLT 90 (L) 11/26/2020   NEUTROABS PENDING 11/26/2020    CMP  Lab Results  Component Value Date   NA 140 11/26/2020   K 4.0 11/26/2020   CL 109 11/26/2020   CO2 23 11/26/2020   GLUCOSE 105 (H) 11/26/2020   BUN 15 11/26/2020   CREATININE 1.10 11/26/2020   CALCIUM 8.6 (L) 11/26/2020   PROT 6.0 (L) 11/26/2020   ALBUMIN 3.0 (L) 11/26/2020   AST 42 (H) 11/26/2020   ALT 15 11/26/2020   ALKPHOS 120 11/26/2020   BILITOT 1.7 (H) 11/26/2020   GFRNONAA >60 11/26/2020   GFRAA 54 (L) 08/02/2020    Lab Results  Component Value Date   CEA1 1,058.81 (H) 10/11/2020     Medications: I have reviewed the patient's current medications.   Assessment/Plan: 1. Colon cancer-transverse, stage IIIb (T3N1), status post a segmental colectomy 04/13/2020 ? 1/10 lymph nodes, lymphovascular invasion present, MSS, resection margins  negative for invasive cancer, "closest" margin positive for low-grade dysplasia; microsatellite stable, tumor mutation burden 3, KRAS G12D ? Colonoscopy 02/09/2020-ulcerated nonobstructing mass at the splenic flexure, 4 mm polyp of the rectum, splenic flexure lesion-adenocarcinoma with intact mismatch repair protein expression, tubular adenoma at the rectal polyp ? CT abdomen/pelvis 02/16/2020-apple core lesion at the distal transverse colon, no evidence of metastatic disease ? CT chest 03/06/2020-no evidence of metastatic disease, stable benign lung nodules ? CT abdomen/pelvis 09/20/2020-interval development of widespread metastatic disease to the liver with multiple hypervascular hepatic masses, largest measuring 8.6 x 6.2 x 6.2 cm. Enlarged gastrohepatic ligament lymph node. ? Cycle 1 FOLFOX 10/11/2020 ? Cycle 2 FOLFOX 11/10/2020, Udenyca ? Cycle 3 FOLFOX 11/26/2020, oxaliplatin dose decreased secondary to thrombocytopenia 2. Rectal bleeding secondary to #1 3. Anemia-likely secondary to GI bleeding 4. Atrial fibrillation, maintained on apixaban 5. BPH 6. Glaucoma 7. Hypertension 8. Hypothyroid 9. History of a CVA 10. Positive resection margin for low-grade dysplasia on the colectomy specimen 04/13/2020 11. Neutropenia following cycle 1 FOLFOX, Udenyca added with cycle 2    Disposition: Mr. Berthelot has completed 2 cycles of FOLFOX.  He has tolerated the chemotherapy well.  He has mild thrombocytopenia today.  I dose adjusted the oxaliplatin.  He will call for bleeding.  I suspect the low blood pressure readings were related to inaccuracy in the home monitor in the setting of atrial  fibrillation.  He will call for recurrent dizziness.  He will contact cardiology to adjust his medical regimen if the blood pressure continues to run low.  Mr. Vonderhaar return for an office visit in the next cycle of chemotherapy in 2 weeks.  We will follow up on the CEA from today.  Betsy Coder, MD  11/26/2020  11:06  AM

## 2020-11-26 NOTE — Progress Notes (Signed)
Ok to treat with TBili 1.7 per MD.  Hardie Pulley, PharmD, BCPS, BCOP

## 2020-11-26 NOTE — Patient Instructions (Signed)
Cancer Center Discharge Instructions for Patients Receiving Chemotherapy  Today you received the following chemotherapy agents: Oxaliplatin, leucovorin, 5FU   To help prevent nausea and vomiting after your treatment, we encourage you to take your nausea medication as directed.    If you develop nausea and vomiting that is not controlled by your nausea medication, call the clinic.   BELOW ARE SYMPTOMS THAT SHOULD BE REPORTED IMMEDIATELY:  *FEVER GREATER THAN 100.5 F  *CHILLS WITH OR WITHOUT FEVER  NAUSEA AND VOMITING THAT IS NOT CONTROLLED WITH YOUR NAUSEA MEDICATION  *UNUSUAL SHORTNESS OF BREATH  *UNUSUAL BRUISING OR BLEEDING  TENDERNESS IN MOUTH AND THROAT WITH OR WITHOUT PRESENCE OF ULCERS  *URINARY PROBLEMS  *BOWEL PROBLEMS  UNUSUAL RASH Items with * indicate a potential emergency and should be followed up as soon as possible.  Feel free to call the clinic should you have any questions or concerns. The clinic phone number is [phone].  Please show the CHEMO ALERT CARD at check-in to the Emergency Department and triage nurse.   

## 2020-11-27 ENCOUNTER — Telehealth: Payer: Self-pay | Admitting: Oncology

## 2020-11-27 NOTE — Telephone Encounter (Signed)
Scheduled appointments per 1/24 los. Called patient, no answer. Left message with appointments dates and times.  

## 2020-11-28 ENCOUNTER — Inpatient Hospital Stay: Payer: Medicare Other

## 2020-11-28 ENCOUNTER — Other Ambulatory Visit: Payer: Self-pay

## 2020-11-28 VITALS — BP 116/90 | Temp 98.0°F | Resp 18

## 2020-11-28 DIAGNOSIS — Z5111 Encounter for antineoplastic chemotherapy: Secondary | ICD-10-CM | POA: Diagnosis not present

## 2020-11-28 DIAGNOSIS — C787 Secondary malignant neoplasm of liver and intrahepatic bile duct: Secondary | ICD-10-CM | POA: Diagnosis not present

## 2020-11-28 DIAGNOSIS — C189 Malignant neoplasm of colon, unspecified: Secondary | ICD-10-CM

## 2020-11-28 DIAGNOSIS — Z23 Encounter for immunization: Secondary | ICD-10-CM | POA: Diagnosis not present

## 2020-11-28 DIAGNOSIS — Z5189 Encounter for other specified aftercare: Secondary | ICD-10-CM | POA: Diagnosis not present

## 2020-11-28 DIAGNOSIS — C184 Malignant neoplasm of transverse colon: Secondary | ICD-10-CM | POA: Diagnosis not present

## 2020-11-28 DIAGNOSIS — D701 Agranulocytosis secondary to cancer chemotherapy: Secondary | ICD-10-CM | POA: Diagnosis not present

## 2020-11-28 MED ORDER — SODIUM CHLORIDE 0.9% FLUSH
10.0000 mL | INTRAVENOUS | Status: DC | PRN
  Administered 2020-11-28: 10 mL
  Filled 2020-11-28: qty 10

## 2020-11-28 MED ORDER — HEPARIN SOD (PORK) LOCK FLUSH 100 UNIT/ML IV SOLN
500.0000 [IU] | Freq: Once | INTRAVENOUS | Status: AC | PRN
  Administered 2020-11-28: 500 [IU]
  Filled 2020-11-28: qty 5

## 2020-11-28 MED ORDER — PEGFILGRASTIM-CBQV 6 MG/0.6ML ~~LOC~~ SOSY
6.0000 mg | PREFILLED_SYRINGE | Freq: Once | SUBCUTANEOUS | Status: AC
  Administered 2020-11-28: 6 mg via SUBCUTANEOUS

## 2020-11-28 MED ORDER — PEGFILGRASTIM-CBQV 6 MG/0.6ML ~~LOC~~ SOSY
PREFILLED_SYRINGE | SUBCUTANEOUS | Status: AC
  Filled 2020-11-28: qty 0.6

## 2020-11-28 NOTE — Patient Instructions (Signed)
Pegfilgrastim injection What is this medicine? PEGFILGRASTIM (PEG fil gra stim) is a long-acting granulocyte colony-stimulating factor that stimulates the growth of neutrophils, a type of white blood cell important in the body's fight against infection. It is used to reduce the incidence of fever and infection in patients with certain types of cancer who are receiving chemotherapy that affects the bone marrow, and to increase survival after being exposed to high doses of radiation. This medicine may be used for other purposes; ask your health care provider or pharmacist if you have questions. COMMON BRAND NAME(S): Fulphila, Neulasta, Nyvepria, UDENYCA, Ziextenzo What should I tell my health care provider before I take this medicine? They need to know if you have any of these conditions:  kidney disease  latex allergy  ongoing radiation therapy  sickle cell disease  skin reactions to acrylic adhesives (On-Body Injector only)  an unusual or allergic reaction to pegfilgrastim, filgrastim, other medicines, foods, dyes, or preservatives  pregnant or trying to get pregnant  breast-feeding How should I use this medicine? This medicine is for injection under the skin. If you get this medicine at home, you will be taught how to prepare and give the pre-filled syringe or how to use the On-body Injector. Refer to the patient Instructions for Use for detailed instructions. Use exactly as directed. Tell your healthcare provider immediately if you suspect that the On-body Injector may not have performed as intended or if you suspect the use of the On-body Injector resulted in a missed or partial dose. It is important that you put your used needles and syringes in a special sharps container. Do not put them in a trash can. If you do not have a sharps container, call your pharmacist or healthcare provider to get one. Talk to your pediatrician regarding the use of this medicine in children. While this drug  may be prescribed for selected conditions, precautions do apply. Overdosage: If you think you have taken too much of this medicine contact a poison control center or emergency room at once. NOTE: This medicine is only for you. Do not share this medicine with others. What if I miss a dose? It is important not to miss your dose. Call your doctor or health care professional if you miss your dose. If you miss a dose due to an On-body Injector failure or leakage, a new dose should be administered as soon as possible using a single prefilled syringe for manual use. What may interact with this medicine? Interactions have not been studied. This list may not describe all possible interactions. Give your health care provider a list of all the medicines, herbs, non-prescription drugs, or dietary supplements you use. Also tell them if you smoke, drink alcohol, or use illegal drugs. Some items may interact with your medicine. What should I watch for while using this medicine? Your condition will be monitored carefully while you are receiving this medicine. You may need blood work done while you are taking this medicine. Talk to your health care provider about your risk of cancer. You may be more at risk for certain types of cancer if you take this medicine. If you are going to need a MRI, CT scan, or other procedure, tell your doctor that you are using this medicine (On-Body Injector only). What side effects may I notice from receiving this medicine? Side effects that you should report to your doctor or health care professional as soon as possible:  allergic reactions (skin rash, itching or hives, swelling of   the face, lips, or tongue)  back pain  dizziness  fever  pain, redness, or irritation at site where injected  pinpoint red spots on the skin  red or dark-brown urine  shortness of breath or breathing problems  stomach or side pain, or pain at the shoulder  swelling  tiredness  trouble  passing urine or change in the amount of urine  unusual bruising or bleeding Side effects that usually do not require medical attention (report to your doctor or health care professional if they continue or are bothersome):  bone pain  muscle pain This list may not describe all possible side effects. Call your doctor for medical advice about side effects. You may report side effects to FDA at 1-800-FDA-1088. Where should I keep my medicine? Keep out of the reach of children. If you are using this medicine at home, you will be instructed on how to store it. Throw away any unused medicine after the expiration date on the label. NOTE: This sheet is a summary. It may not cover all possible information. If you have questions about this medicine, talk to your doctor, pharmacist, or health care provider.  2021 Elsevier/Gold Standard (2019-11-11 13:20:51)  

## 2020-11-29 ENCOUNTER — Telehealth: Payer: Self-pay | Admitting: Cardiovascular Disease

## 2020-11-29 NOTE — Telephone Encounter (Signed)
I called and spoke with the patient. He states he just finished his 3rd round of chemo for Stage 4 Liver Cancer (Folfox is the chemo he has been taking).  He saw his oncologist this week and was advised to call us due to recorded low BP readings at home.  11/24/20- 65/46 11/25/20- 99/79 11/29/20- 72/55  He states that his BP has been averaging ~ 118/90 usually and his HR's average ~ 105 bpm. He has seen HR readings of 60, 43, 103, & 122 lately.  He has known AF.   I have reviewed the patient's medications with him. He is currently taking: - metoprolol succinate 50 mg BID - spironolactone 12.5 mg QD - flomax 0.4 mg QD  The patient confirms he has taken metoprolol succ 50 mg this morning & spironolactone 12.5 mg this morning.  I inquired if he has been dizzy/ lightheaded. He advised he is fatigued and he did stand up this morning and didn't fall, but stumbled backwards into a dresser.  I have advised the patient to please drink some extra water this afternoon and that I am going to check with one of the providers about holding/ reducing his medication to allow his BP to come up.   The patient voices understanding and is agreeable.

## 2020-11-29 NOTE — Telephone Encounter (Signed)
He should stop taking aldactone. Probably needs to continue metoprolol for rate control  Peter Martinique MD, [facility]

## 2020-11-29 NOTE — Telephone Encounter (Signed)
Spoke to patient, aware of recommendations.   Will continue to monitor blood pressure at home and aware to make sure he is staying hydrated.

## 2020-11-29 NOTE — Telephone Encounter (Signed)
Pt c/o BP issue: STAT if pt c/o blurred vision, one-sided weakness or slurred speech  1. What are your last 5 BP readings? 11/24/20 65/46 same day 84/69 one hour ago 72/55   2. Are you having any other symptoms (ex. Dizziness, headache, blurred vision, passed out)? Very weak   3. What is your BP issue? BP is running very low and patient also takes chemo

## 2020-11-29 NOTE — Telephone Encounter (Signed)
Left message to call back  

## 2020-12-02 ENCOUNTER — Telehealth: Payer: Self-pay | Admitting: Student in an Organized Health Care Education/Training Program

## 2020-12-02 NOTE — Telephone Encounter (Signed)
Dylan York called to discuss recurrent symptomatic hypotension. Currently 80/50s. He feels week/fatigued and generally unwell when BP <90. Recently had similar call and Dr. Martinique recommended stopping aldactone. He did this on 01/27 but has still had issues. This is all since his chemotx (FOLFOX) was started for stage IV colon CA. Last seen by onc on 11/26/20, BP in clinic was 140/96. He has known AF (rates 70-110 currently). I am concerned his BP cuff may be inaccurate as it has been a while since he had it checked. He feels that he can definitely tell when his BP is low and does report poor PO intake. He feels that the toprol could be causing him to have low BP. Although this is less likely he has no other meds that we can d/c other than flomax which I doubt is significantly contributing. He is going to hold his evening dose of toprol and recheck BP/HR in the morning. Explained that his AF rates will likely be less controlled on once daily toprol however he feels he cannot deal with the symptoms related to hypotension ongoing. He may need IVFs if symptoms do not resolve with reducing dose of toprol. Asked him to call back if symptoms and hypotension are persistent. Otherwise for now will reduce dose to toprol XL 50 mg PO once daily from bid, asked him to return to bid dosing if normotensive. Also asked that he go to local pharmacy to get BP cuff checked.

## 2020-12-09 ENCOUNTER — Other Ambulatory Visit: Payer: Self-pay | Admitting: Oncology

## 2020-12-10 MED FILL — Dexamethasone Sodium Phosphate Inj 100 MG/10ML: INTRAMUSCULAR | Qty: 1 | Status: AC

## 2020-12-11 ENCOUNTER — Inpatient Hospital Stay (HOSPITAL_BASED_OUTPATIENT_CLINIC_OR_DEPARTMENT_OTHER): Payer: Medicare Other | Admitting: Nurse Practitioner

## 2020-12-11 ENCOUNTER — Other Ambulatory Visit: Payer: Self-pay

## 2020-12-11 ENCOUNTER — Inpatient Hospital Stay: Payer: Medicare Other

## 2020-12-11 ENCOUNTER — Inpatient Hospital Stay: Payer: Medicare Other | Attending: Oncology

## 2020-12-11 ENCOUNTER — Encounter: Payer: Self-pay | Admitting: Nurse Practitioner

## 2020-12-11 VITALS — BP 108/65 | HR 92 | Temp 97.0°F | Resp 16 | Ht 68.0 in | Wt 131.0 lb

## 2020-12-11 DIAGNOSIS — Z79899 Other long term (current) drug therapy: Secondary | ICD-10-CM | POA: Diagnosis not present

## 2020-12-11 DIAGNOSIS — C787 Secondary malignant neoplasm of liver and intrahepatic bile duct: Secondary | ICD-10-CM | POA: Diagnosis not present

## 2020-12-11 DIAGNOSIS — C184 Malignant neoplasm of transverse colon: Secondary | ICD-10-CM | POA: Diagnosis not present

## 2020-12-11 DIAGNOSIS — D6959 Other secondary thrombocytopenia: Secondary | ICD-10-CM | POA: Diagnosis not present

## 2020-12-11 DIAGNOSIS — D500 Iron deficiency anemia secondary to blood loss (chronic): Secondary | ICD-10-CM | POA: Diagnosis not present

## 2020-12-11 DIAGNOSIS — I10 Essential (primary) hypertension: Secondary | ICD-10-CM | POA: Diagnosis not present

## 2020-12-11 DIAGNOSIS — C189 Malignant neoplasm of colon, unspecified: Secondary | ICD-10-CM

## 2020-12-11 DIAGNOSIS — E039 Hypothyroidism, unspecified: Secondary | ICD-10-CM | POA: Diagnosis not present

## 2020-12-11 DIAGNOSIS — Z5111 Encounter for antineoplastic chemotherapy: Secondary | ICD-10-CM | POA: Insufficient documentation

## 2020-12-11 DIAGNOSIS — Z95828 Presence of other vascular implants and grafts: Secondary | ICD-10-CM

## 2020-12-11 DIAGNOSIS — T451X5A Adverse effect of antineoplastic and immunosuppressive drugs, initial encounter: Secondary | ICD-10-CM | POA: Insufficient documentation

## 2020-12-11 DIAGNOSIS — D701 Agranulocytosis secondary to cancer chemotherapy: Secondary | ICD-10-CM | POA: Diagnosis not present

## 2020-12-11 DIAGNOSIS — Z5189 Encounter for other specified aftercare: Secondary | ICD-10-CM | POA: Diagnosis not present

## 2020-12-11 LAB — CMP (CANCER CENTER ONLY)
ALT: 17 U/L (ref 0–44)
AST: 37 U/L (ref 15–41)
Albumin: 3 g/dL — ABNORMAL LOW (ref 3.5–5.0)
Alkaline Phosphatase: 112 U/L (ref 38–126)
Anion gap: 7 (ref 5–15)
BUN: 19 mg/dL (ref 8–23)
CO2: 22 mmol/L (ref 22–32)
Calcium: 8.4 mg/dL — ABNORMAL LOW (ref 8.9–10.3)
Chloride: 111 mmol/L (ref 98–111)
Creatinine: 1.09 mg/dL (ref 0.61–1.24)
GFR, Estimated: >60 mL/min (ref >60)
Glucose, Bld: 130 mg/dL — ABNORMAL HIGH (ref 70–99)
Potassium: 4.1 mmol/L (ref 3.5–5.1)
Sodium: 140 mmol/L (ref 135–145)
Total Bilirubin: 2 mg/dL — ABNORMAL HIGH (ref 0.3–1.2)
Total Protein: 5.6 g/dL — ABNORMAL LOW (ref 6.5–8.1)

## 2020-12-11 LAB — CBC WITH DIFFERENTIAL (CANCER CENTER ONLY)
Abs Immature Granulocytes: 0.4 10*3/uL — ABNORMAL HIGH (ref 0.00–0.07)
Basophils Absolute: 0 10*3/uL (ref 0.0–0.1)
Basophils Relative: 0 %
Eosinophils Absolute: 0 10*3/uL (ref 0.0–0.5)
Eosinophils Relative: 0 %
HCT: 30.7 % — ABNORMAL LOW (ref 39.0–52.0)
Hemoglobin: 9.6 g/dL — ABNORMAL LOW (ref 13.0–17.0)
Immature Granulocytes: 4 %
Lymphocytes Relative: 9 %
Lymphs Abs: 0.9 10*3/uL (ref 0.7–4.0)
MCH: 32.1 pg (ref 26.0–34.0)
MCHC: 31.3 g/dL (ref 30.0–36.0)
MCV: 102.7 fL — ABNORMAL HIGH (ref 80.0–100.0)
Monocytes Absolute: 0.7 10*3/uL (ref 0.1–1.0)
Monocytes Relative: 8 %
Neutro Abs: 7.4 10*3/uL (ref 1.7–7.7)
Neutrophils Relative %: 79 %
Platelet Count: 57 10*3/uL — ABNORMAL LOW (ref 150–400)
RBC: 2.99 MIL/uL — ABNORMAL LOW (ref 4.22–5.81)
RDW: 18.9 % — ABNORMAL HIGH (ref 11.5–15.5)
WBC Count: 9.4 10*3/uL (ref 4.0–10.5)
nRBC: 0 % (ref 0.0–0.2)

## 2020-12-11 LAB — CEA (IN HOUSE-CHCC): CEA (CHCC-In House): 2452.42 ng/mL — ABNORMAL HIGH (ref 0.00–5.00)

## 2020-12-11 MED ORDER — SODIUM CHLORIDE 0.9% FLUSH
10.0000 mL | INTRAVENOUS | Status: DC | PRN
  Administered 2020-12-11: 10 mL via INTRAVENOUS
  Filled 2020-12-11: qty 10

## 2020-12-11 NOTE — Patient Instructions (Signed)

## 2020-12-11 NOTE — Progress Notes (Addendum)
[place]   [phone] [phone]   Clinic Follow up Note   Patient Care Team: Glenda Chroman, MD as PCP - General (Internal Medicine) Troy Sine, MD as PCP - Cardiology (Cardiology) Myrlene Broker, MD as Attending Physician (Urology) Ladell Pier, MD as Consulting Physician (Oncology) Jonnie Finner, RN as Oncology Nurse Navigator Brien Mates, RN as Oncology Nurse Navigator (Oncology) 12/11/2020  CHIEF COMPLAINT: Follow up colon cancer   CURRENT THERAPY: FOLFOX q2 weeks   INTERVAL HISTORY: Mr. Kimberley returns for follow up as scheduled. He completed cycle 3 dose-reduced FOLFOX on 11/26/20. He had 2 episodes of low SBP at home 60 - 80. He was dizzy. He "stumbled" once and grazed his arm but did not completely fall. He called on-call line, the provider stopped aldactone and reduced metoprolol to 50 mg po once daily. He is on eliquis for Afib and prior stroke. He notices blood tinged nasal drainage and he coughed up blood once, "3 clots." Denies hematuria, hematochezia, melena, epistaxis, or gum bleeding. He is eating and drinking. Remains active at home and takes care of his wife. Cold sensitivity lasts 2 weeks but improves over time. Denies n/v/d. He has mild constipation. Urine is "dark orange." Denies abdominal pain.    MEDICAL HISTORY:  Past Medical History:  Diagnosis Date  . Atrial fibrillation ([place])   . Atrial tachycardia ([place])   . Biatrial enlargement    severe biatrial enlargement  . BPH (benign prostatic hyperplasia)   . Colon cancer ([place])   . Diabetes mellitus without complication ([place])   . Glaucoma   . Graded compression stocking in place   . Hypertension   . Hypothyroidism   . Neutropenia ([place])   . Rosacea   . Sinus bradycardia, with HR in the 40s BB stopped. 08/08/2013  . SSS (sick sinus syndrome) ([place])   . Stroke [facility])    no residual  . Thyroid disease   . Tricuspid regurgitation     SURGICAL  HISTORY: Past Surgical History:  Procedure Laterality Date  . APPENDECTOMY    . BACTERIAL OVERGROWTH TEST N/A 01/14/2016   Procedure: BACTERIAL OVERGROWTH TEST;  Surgeon: Rogene Houston, MD;  [facility];  Service: Endoscopy;  Laterality: N/A;  730  . BIOPSY  02/09/2020   Procedure: BIOPSY;  Surgeon: Rogene Houston, MD;  Location[facility];  Service: Endoscopy;;  . COLONOSCOPY N/A 02/09/2020   Procedure: COLONOSCOPY;  Surgeon: Rogene Houston, MD;  Location: [facility] ENDO SUITE;  Service: Endoscopy;  Laterality: N/A;  1225  . HERNIA REPAIR    . INGUINAL HERNIA REPAIR Right   . IR IMAGING GUIDED PORT INSERTION  10/08/2020  . POLYPECTOMY  02/09/2020   Procedure: POLYPECTOMY;  Surgeon: Rogene Houston, MD;  Location: [facility] END[facility] SU[facility];  Service: Endoscopy;;  . TRANSURETHRAL RESECTION OF PROSTATE    . UMBILICAL HERNIA REPAIR      I have reviewed the social history and family history with the patient and they are unchanged from previous note.  ALLERGIES:  is allergic to amiodarone and multaq [dronedarone].  MEDICATIONS:  Current Outpatient Medications  Medication Sig Dispense Refill  . COMBIGAN 0.2-0.5 % ophthalmic solution Place 1 drop into both eyes 2 (two) times daily.    Marland Kitchen docusate sodium (COLACE) 100 MG capsule Take 2 capsules (200 mg total) by mouth at bedtime. 60 capsule 0  . ELIQUIS 5 MG TABS tablet TAKE 1 TABLET BY MOUTH  TWICE DAILY 180 tablet 1  . finasteride (PROSCAR) 5 MG tablet Take 5 mg by mouth daily.    Marland Kitchen levothyroxine (SYNTHROID, LEVOTHROID) 75 MCG tablet Take 75 mcg by mouth daily before breakfast.     . lidocaine-prilocaine (EMLA) cream Apply to port site 1-2 hours prior to use 30 g 2  . LORazepam (ATIVAN) 1 MG tablet Take 0.5 mg by mouth at bedtime as needed for sleep.   2  . metoprolol succinate (TOPROL-XL) 50 MG 24 hr tablet TAKE 2 TABLETS BY MOUTH TWICE A DAY (50 MG TWICE A DAY) (Patient taking differently: Take 50 mg by mouth in the morning and at bedtime. TAKE  2 TABLETS BY MOUTH TWICE A DAY (50 MG TWICE A DAY)) 180 tablet 3  . prochlorperazine (COMPAZINE) 5 MG tablet Take 1 tablet (5 mg total) by mouth every 6 (six) hours as needed for nausea or vomiting. (Patient not taking: No sig reported) 30 tablet 2  . Sulfacetamide Sodium-Sulfur 10-5 % CREA Apply topically daily. To forehead (Patient not taking: No sig reported)    . tamsulosin (FLOMAX) 0.4 MG CAPS capsule Take 1 capsule (0.4 mg total) by mouth at bedtime. 90 capsule 3  . Vitamin D, Cholecalciferol, 50 MCG (2000 UT) CAPS Take 2,000 Units by mouth daily.      No current facility-administered medications for this visit.    PHYSICAL EXAMINATION:  Vitals:   12/11/20 1248  BP: 108/65  Pulse: 92  Resp: 16  Temp: (!) 97 F (36.1 C)  SpO2: 100%   Filed Weights   12/11/20 1248  Weight: 131 lb (59.4 kg)    GENERAL:alert, no distress and comfortable SKIN: scattered ecchymoses, covered abrasion to right forearm   EYES: sclera clear LUNGS: clear with normal breathing effort HEART: Afib, no lower extremity edema ABDOMEN: abdomen soft, non-tender and normal bowel sounds. Palpable liver in right upper abdomen NEURO: alert & oriented x 3 with fluent speech PAC without erythema   LABORATORY DATA:  I have reviewed the data as listed CBC Latest Ref Rng & Units 12/11/2020 11/26/2020 11/09/2020  WBC 4.0 - 10.5 K/uL 9.4 10.6(H) 6.0  Hemoglobin 13.0 - 17.0 g/dL 9.6(L) 10.5(L) 10.9(L)  Hematocrit 39.0 - 52.0 % 30.7(L) 32.1(L) 34.1(L)  Platelets 150 - 400 K/uL 57(L) 90(L) 138(L)     CMP Latest Ref Rng & Units 12/11/2020 11/26/2020 11/09/2020  Glucose 70 - 99 mg/dL 130(H) 105(H) 97  BUN 8 - 23 mg/dL _0 Creatinine 0.61 - 1.24 mg/dL 1.09 1.10 0.98  Sodium 135 - 145 mmol/L 140 140 137  Potassium 3.5 - 5.1 mmol/L 4.1 4.0 4.2  Chloride 98 - 111 mmol/L 111 109 109  CO2 22 - 32 mmol/L 22 23 20(L)  Calcium 8.9 - 10.3 mg/dL 8.4(L) 8.6(L) 9.0  Total Protein 6.5 - 8.1 g/dL 5.6(L) 6.0(L) 6.5  Total  Bilirubin 0.3 - 1.2 mg/dL 2.0(H) 1.7(H) 1.8(H)  Alkaline Phos 38 - 126 U/L 112 120 125  AST 15 - 41 U/L 37 42(H) 55(H)  ALT 0 - 44 U/L _1 RADIOGRAPHIC STUDIES: I have personally reviewed the radiological images as listed and agreed with the findings in the report. No results found.   ASSESSMENT & PLAN:   1. Colon cancer-transverse, stage IIIb (T3N1), status post a segmental colectomy 04/13/2020 ? 1/10 lymph nodes, lymphovascular invasion present, MSS, resection margins negative for invasive cancer, "closest" margin positive for low-grade dysplasia; microsatellite stable, tumor mutation burden 3,  KRAS G12D ? Colonoscopy 02/09/2020-ulcerated nonobstructing mass at the splenic flexure, 4 mm polyp of the rectum, splenic flexure lesion-adenocarcinoma with intact mismatch repair protein expression, tubular adenoma at the rectal polyp ? CT abdomen/pelvis 02/16/2020-apple core lesion at the distal transverse colon, no evidence of metastatic disease ? CT chest 03/06/2020-no evidence of metastatic disease, stable benign lung nodules ? CT abdomen/pelvis 09/20/2020-interval development of widespread metastatic disease to the liver with multiple hypervascular hepatic masses, largest measuring 8.6 x 6.2 x 6.2 cm. Enlarged gastrohepatic ligament lymph node. ? Cycle 1 FOLFOX 10/11/2020 ? Cycle 2 FOLFOX 11/10/2020, Udenyca ? Cycle 3 FOLFOX 11/26/2020, oxaliplatin dose decreased secondary to thrombocytopenia ? Cycle 4 FOLFOX  postponed due to thrombocytopenia (57K on 12/11/20) 2. Rectal bleeding secondary to #1 3. Anemia-likely secondary to GI bleeding 4. Atrial fibrillation, maintained on apixaban 5. BPH 6. Glaucoma 7. Hypertension 8. Hypothyroid 9. History of a CVA 10. Positive resection margin for low-grade dysplasia on the colectomy specimen 04/13/2020 11. Neutropenia following cycle 1 FOLFOX, Udenyca added with cycle 2 12. Thrombocytopenia, secondary to chemotherapy   Disposition: Mr. Bolyard  appears stable.  He completed cycle 3 dose-reduced FOLFOX.  He tolerates treatment moderately well with cold sensitivity and constipation.  He has periodic hypotension at home, meds have been adjusted.  I encouraged him to bring his home monitor to next follow-up for comparison.  He is able to recover and function well.  Labs reviewed, CMP is stable.  CBC shows progressive thrombocytopenia platelet 57K.  Due to evidence of active bleeding he will hold Eliquis and postpone chemo to next week.  He knows to contact our office if he has further bleeding.  He will return for lab, follow-up, and cycle 4 FOLFOX next week if plt count has recovered.  Given the CEA has stabilized, plan to restage after cycle 5.  The patient was seen with Dr. Benay Spice.  All questions were answered. The patient knows to call the clinic with any problems, questions or concerns. No barriers to learning were detected.     Alla Feeling, NP 12/11/20  This was a shared visit with Cira Rue. Mr. Mayabb will hold eliquis.  He will call for bleeding.  Julieanne Manson, MD

## 2020-12-13 ENCOUNTER — Inpatient Hospital Stay: Payer: Medicare Other

## 2020-12-16 ENCOUNTER — Other Ambulatory Visit: Payer: Self-pay | Admitting: Nurse Practitioner

## 2020-12-16 DIAGNOSIS — C189 Malignant neoplasm of colon, unspecified: Secondary | ICD-10-CM

## 2020-12-16 NOTE — Progress Notes (Addendum)
[place]   Telephone:(336) [phone] Fax:(336) [phone]   Clinic Follow up Note   Patient Care Team: Glenda Chroman, MD as PCP - General (Internal Medicine) Troy Sine, MD as PCP - Cardiology (Cardiology) Myrlene Broker, MD as Attending Physician (Urology) Ladell Pier, MD as Consulting Physician (Oncology) Jonnie Finner, RN as Oncology Nurse Navigator Brien Mates, RN as Oncology Nurse Navigator (Oncology) 12/17/2020  CHIEF COMPLAINT: Follow up colon cancer   CURRENT THERAPY: FOLFOX q2 weeks   INTERVAL HISTORY: Dylan York returns for follow up and treatment as scheduled. FOLFOX was postponed from 2/8 due to thrombocytopenia and bleeding, Eliquis was placed on hold.  Today he denies further bleeding.  The cold sensitivity and his altered taste improved with the extra week off chemo.  No residual neuropathy.  He continues to feel tired but manages his activity level at home.  Continues to deal with constipation.  He has bilateral leg swelling, somewhat new for hom, has not worn compression socks lately.  He has mild left calf pain.  Denies any fever, chills, cough, chest pain, dyspnea, increased pain, or other concerns.   MEDICAL HISTORY:  Past Medical History:  Diagnosis Date  . Atrial fibrillation ([place])   . Atrial tachycardia ([place])   . Biatrial enlargement    severe biatrial enlargement  . BPH (benign prostatic hyperplasia)   . Colon cancer ([place])   . Diabetes mellitus without complication ([place])   . Glaucoma   . Graded compression stocking in place   . Hypertension   . Hypothyroidism   . Neutropenia ([place])   . Rosacea   . Sinus bradycardia, with HR in the 40s BB stopped. 08/08/2013  . SSS (sick sinus syndrome) ([place])   . Stroke [facility])    no residual  . Thyroid disease   . Tricuspid regurgitation     SURGICAL HISTORY: Past Surgical History:  Procedure Laterality Date  . APPENDECTOMY    . BACTERIAL OVERGROWTH TEST N/A 01/14/2016    Procedure: BACTERIAL OVERGROWTH TEST;  Surgeon: Rogene Houston, MD;  [facility];  Service: Endoscopy;  Laterality: N/A;  730  . BIOPSY  02/09/2020   Procedure: BIOPSY;  Surgeon: Rogene Houston, MD;  [facility];  Service: Endoscopy;;  . COLONOSCOPY N/A 02/09/2020   Procedure: COLONOSCOPY;  Surgeon: Rogene Houston, MD;  Location: [facility];  Service: Endoscopy;  Laterality: N/A;  1225  . HERNIA REPAIR    . INGUINAL HERNIA REPAIR Right   . IR IMAGING GUIDED PORT INSERTION  10/08/2020  . POLYPECTOMY  02/09/2020   Procedure: POLYPECTOMY;  Surgeon: Rogene Houston, MD;  Location: [facility];  Service: Endoscopy;;  . TRANSURETHRAL RESECTION OF PROSTATE    . UMBILICAL HERNIA REPAIR      I have reviewed the social history and family history with the patient and they are unchanged from previous note.  ALLERGIES:  is allergic to amiodarone and multaq [dronedarone].  MEDICATIONS:  Current Outpatient Medications  Medication Sig Dispense Refill  . COMBIGAN 0.2-0.5 % ophthalmic solution Place 1 drop into both eyes 2 (two) times daily.    Marland Kitchen docusate sodium (COLACE) 100 MG capsule Take 2 capsules (200 mg total) by mouth at bedtime. 60 capsule 0  . ELIQUIS 5 MG TABS tablet TAKE 1 TABLET BY MOUTH TWICE DAILY 180 tablet 1  . finasteride (PROSCAR) 5 MG tablet Take 5 mg by mouth daily.    Marland Kitchen levothyroxine (  SYNTHROID, LEVOTHROID) 75 MCG tablet Take 75 mcg by mouth daily before breakfast.     . lidocaine-prilocaine (EMLA) cream Apply to port site 1-2 hours prior to use 30 g 2  . LORazepam (ATIVAN) 1 MG tablet Take 0.5 mg by mouth at bedtime as needed for sleep.   2  . metoprolol succinate (TOPROL-XL) 50 MG 24 hr tablet TAKE 2 TABLETS BY MOUTH TWICE A DAY (50 MG TWICE A DAY) (Patient taking differently: Take 50 mg by mouth in the morning and at bedtime. TAKE 2 TABLETS BY MOUTH TWICE A DAY (50 MG TWICE A DAY)) 180 tablet 3  . spironolactone (ALDACTONE) 25 MG tablet Pt is unsure if  he is taking    . Sulfacetamide Sodium-Sulfur 10-5 % CREA Apply topically daily. To forehead    . tamsulosin (FLOMAX) 0.4 MG CAPS capsule Take 1 capsule (0.4 mg total) by mouth at bedtime. 90 capsule 3  . Vitamin D, Cholecalciferol, 50 MCG (2000 UT) CAPS Take 2,000 Units by mouth daily.     . prochlorperazine (COMPAZINE) 5 MG tablet Take 1 tablet (5 mg total) by mouth every 6 (six) hours as needed for nausea or vomiting. (Patient not taking: No sig reported) 30 tablet 2   No current facility-administered medications for this visit.   Facility-Administered Medications Ordered in Other Visits  Medication Dose Route Frequency Provider Last Rate Last Admin  . fluorouracil (ADRUCIL) 3,400 mg in sodium chloride 0.9 % 82 mL chemo infusion  2,000 mg/m2 (Treatment Plan Recorded) Intravenous 1 day or 1 dose Ladell Pier, MD      . leucovorin 680 mg in dextrose 5 % 250 mL infusion  400 mg/m2 (Treatment Plan Recorded) Intravenous Once Ladell Pier, MD      . oxaliplatin (ELOXATIN) 85 mg in dextrose 5 % 500 mL chemo infusion  50 mg/m2 (Treatment Plan Recorded) Intravenous Once Ladell Pier, MD        PHYSICAL EXAMINATION:  Vitals:   12/17/20 1349  BP: (!) 136/96  Pulse: (!) 56  Resp: 20  Temp: 98.4 F (36.9 C)  SpO2: 99%   Filed Weights   12/17/20 1349  Weight: 130 lb 1.6 oz (59 kg)    GENERAL:alert, no distress and comfortable SKIN: no rash  EYES: sclera clear LUNGS: clear, normal breathing effort HEART: Afib. Mild bilateral lower extremity edema L>R with mild left calf tenderness  ABDOMEN:abdomen soft, non-tender and normal bowel sounds. Soft tissue nodularity at the right abdominal wall, likely mesh from hernia repair  NEURO: alert & oriented x 3 with fluent speech PAC without erythema    LABORATORY DATA:  I have reviewed the data as listed CBC Latest Ref Rng & Units 12/17/2020 12/11/2020 11/26/2020  WBC 4.0 - 10.5 K/uL 4.9 9.4 10.6(H)  Hemoglobin 13.0 - 17.0 g/dL 9.9(L)  9.6(L) 10.5(L)  Hematocrit 39.0 - 52.0 % 31.0(L) 30.7(L) 32.1(L)  Platelets 150 - 400 K/uL 90(L) 57(L) 90(L)     CMP Latest Ref Rng & Units 12/17/2020 12/11/2020 11/26/2020  Glucose 70 - 99 mg/dL 95 130(H) 105(H)  BUN 8 - 23 mg/dL $Remove'14 19 15  'bCEvyfM$ Creatinine 0.61 - 1.24 mg/dL 0.92 1.09 1.10  Sodium 135 - 145 mmol/L 140 140 140  Potassium 3.5 - 5.1 mmol/L 4.1 4.1 4.0  Chloride 98 - 111 mmol/L 112(H) 111 109  CO2 22 - 32 mmol/L $RemoveB'23 22 23  'BzYTwOwg$ Calcium 8.9 - 10.3 mg/dL 8.5(L) 8.4(L) 8.6(L)  Total Protein 6.5 - 8.1 g/dL 5.7(L) 5.6(L) 6.0(L)  Total Bilirubin 0.3 - 1.2 mg/dL 1.8(H) 2.0(H) 1.7(H)  Alkaline Phos 38 - 126 U/L 100 112 120  AST 15 - 41 U/L 42(H) 37 42(H)  ALT 0 - 44 U/L $Remo'15 17 15      'axGJE$ RADIOGRAPHIC STUDIES: I have personally reviewed the radiological images as listed and agreed with the findings in the report. No results found.   ASSESSMENT & PLAN:   1. Colon cancer-transverse, stage IIIb (T3N1), status post a segmental colectomy 04/13/2020 ? 1/10 lymph nodes, lymphovascular invasion present, MSS, resection margins negative for invasive cancer, "closest" margin positive for low-grade dysplasia; microsatellite stable, tumor mutation burden 3, KRAS G12D ? Colonoscopy 02/09/2020-ulcerated nonobstructing mass at the splenic flexure, 4 mm polyp of the rectum, splenic flexure lesion-adenocarcinoma with intact mismatch repair protein expression, tubular adenoma at the rectal polyp ? CT abdomen/pelvis 02/16/2020-apple core lesion at the distal transverse colon, no evidence of metastatic disease ? CT chest 03/06/2020-no evidence of metastatic disease, stable benign lung nodules ? CT abdomen/pelvis 09/20/2020-interval development of widespread metastatic disease to the liver with multiple hypervascular hepatic masses, largest measuring 8.6 x 6.2 x 6.2 cm. Enlarged gastrohepatic ligament lymph node. ? Cycle 1 FOLFOX 10/11/2020 ? Cycle 2 FOLFOX 11/10/2020, Udenyca ? Cycle 3 FOLFOX 11/26/2020, oxaliplatin dose  decreased secondary to thrombocytopenia ? Cycle 4 FOLFOX  12/17/2020, postponed and oxaliplatin dose decreased secondary to progressive thrombocytopenia  2. Rectal bleeding secondary to #1 3. Anemia-likely secondary to GI bleeding 4. Atrial fibrillation, maintained on apixaban 5. BPH 6. Glaucoma 7. Hypertension 8. Hypothyroid 9. History of a CVA 10. Positive resection margin for low-grade dysplasia on the colectomy specimen 04/13/2020 11. Neutropenia following cycle 1 FOLFOX, Udenyca added with cycle 2 12. Thrombocytopenia, secondary to chemotherapy    Disposition:  Dylan York appears stable. He has no signs of recurrent bleeding. He will resume eliquis today. Exam shows bilateral L>R edema with mild calf tenderness, he is being referred for doppler ASAP to r/o DVT. He also has soft tissue nodularity at the right abdomen which is felt to be mesh material from hernia repair. Otherwise doing well.   Labs reviewed, plt improved to 90K; CBC and CMP otherwise stable and adequate for chemo. He will proceed with cycle 4 FOLFOX, oxaliplatin will be further dose-reduced for progressive thrombocytopenia. CEA improved after cycle 3. The plan is to restage after cycle 5.   The patient was seen with Dr. Benay Spice.    Orders Placed This Encounter  Procedures  . CBC with Differential (Cancer Center Only)    Standing Status:   Future    Standing Expiration Date:   12/17/2021  . CMP ([place] only)    Standing Status:   Future    Standing Expiration Date:   12/17/2021  . CEA (IN HOUSE-[facility])    Standing Status:   Future    Standing Expiration Date:   12/17/2021   All questions were answered. The patient knows to call the clinic with any problems, questions or concerns. No barriers to learning were detected.     Alla Feeling, NP 12/17/20  This was a shared visit with Cira Rue.  Dylan York was interviewed and examined.  Platelet count is improved.  He will complete another cycle of FOLFOX today.   The oxaliplatin will be further dose reduced.  He will resume anticoagulation therapy.  He will call for bleeding.  I was present for greater than 50% of today's visit.  I performed medical decision making.  Julieanne Manson, MD

## 2020-12-17 ENCOUNTER — Inpatient Hospital Stay: Payer: Medicare Other

## 2020-12-17 ENCOUNTER — Other Ambulatory Visit: Payer: Self-pay

## 2020-12-17 ENCOUNTER — Encounter: Payer: Self-pay | Admitting: Nurse Practitioner

## 2020-12-17 ENCOUNTER — Inpatient Hospital Stay (HOSPITAL_BASED_OUTPATIENT_CLINIC_OR_DEPARTMENT_OTHER): Payer: Medicare Other | Admitting: Nurse Practitioner

## 2020-12-17 VITALS — BP 136/96 | HR 56 | Temp 98.4°F | Resp 20 | Ht 68.0 in | Wt 130.1 lb

## 2020-12-17 DIAGNOSIS — C189 Malignant neoplasm of colon, unspecified: Secondary | ICD-10-CM

## 2020-12-17 DIAGNOSIS — D701 Agranulocytosis secondary to cancer chemotherapy: Secondary | ICD-10-CM | POA: Diagnosis not present

## 2020-12-17 DIAGNOSIS — C184 Malignant neoplasm of transverse colon: Secondary | ICD-10-CM | POA: Diagnosis not present

## 2020-12-17 DIAGNOSIS — Z5111 Encounter for antineoplastic chemotherapy: Secondary | ICD-10-CM | POA: Diagnosis not present

## 2020-12-17 DIAGNOSIS — D6959 Other secondary thrombocytopenia: Secondary | ICD-10-CM | POA: Diagnosis not present

## 2020-12-17 DIAGNOSIS — C787 Secondary malignant neoplasm of liver and intrahepatic bile duct: Secondary | ICD-10-CM | POA: Diagnosis not present

## 2020-12-17 DIAGNOSIS — Z5189 Encounter for other specified aftercare: Secondary | ICD-10-CM | POA: Diagnosis not present

## 2020-12-17 LAB — CBC WITH DIFFERENTIAL (CANCER CENTER ONLY)
Abs Immature Granulocytes: 0.02 10*3/uL (ref 0.00–0.07)
Basophils Absolute: 0.1 10*3/uL (ref 0.0–0.1)
Basophils Relative: 1 %
Eosinophils Absolute: 0 10*3/uL (ref 0.0–0.5)
Eosinophils Relative: 0 %
HCT: 31 % — ABNORMAL LOW (ref 39.0–52.0)
Hemoglobin: 9.9 g/dL — ABNORMAL LOW (ref 13.0–17.0)
Immature Granulocytes: 0 %
Lymphocytes Relative: 18 %
Lymphs Abs: 0.9 10*3/uL (ref 0.7–4.0)
MCH: 32.9 pg (ref 26.0–34.0)
MCHC: 31.9 g/dL (ref 30.0–36.0)
MCV: 103 fL — ABNORMAL HIGH (ref 80.0–100.0)
Monocytes Absolute: 0.7 10*3/uL (ref 0.1–1.0)
Monocytes Relative: 14 %
Neutro Abs: 3.2 10*3/uL (ref 1.7–7.7)
Neutrophils Relative %: 67 %
Platelet Count: 90 10*3/uL — ABNORMAL LOW (ref 150–400)
RBC: 3.01 MIL/uL — ABNORMAL LOW (ref 4.22–5.81)
RDW: 19.9 % — ABNORMAL HIGH (ref 11.5–15.5)
WBC Count: 4.9 10*3/uL (ref 4.0–10.5)
nRBC: 0 % (ref 0.0–0.2)

## 2020-12-17 LAB — CMP (CANCER CENTER ONLY)
ALT: 15 U/L (ref 0–44)
AST: 42 U/L — ABNORMAL HIGH (ref 15–41)
Albumin: 3 g/dL — ABNORMAL LOW (ref 3.5–5.0)
Alkaline Phosphatase: 100 U/L (ref 38–126)
Anion gap: 5 (ref 5–15)
BUN: 14 mg/dL (ref 8–23)
CO2: 23 mmol/L (ref 22–32)
Calcium: 8.5 mg/dL — ABNORMAL LOW (ref 8.9–10.3)
Chloride: 112 mmol/L — ABNORMAL HIGH (ref 98–111)
Creatinine: 0.92 mg/dL (ref 0.61–1.24)
GFR, Estimated: >60 mL/min (ref >60)
Glucose, Bld: 95 mg/dL (ref 70–99)
Potassium: 4.1 mmol/L (ref 3.5–5.1)
Sodium: 140 mmol/L (ref 135–145)
Total Bilirubin: 1.8 mg/dL — ABNORMAL HIGH (ref 0.3–1.2)
Total Protein: 5.7 g/dL — ABNORMAL LOW (ref 6.5–8.1)

## 2020-12-17 MED ORDER — PALONOSETRON HCL INJECTION 0.25 MG/5ML
INTRAVENOUS | Status: AC
  Filled 2020-12-17: qty 5

## 2020-12-17 MED ORDER — DEXTROSE 5 % IV SOLN
Freq: Once | INTRAVENOUS | Status: AC
  Filled 2020-12-17: qty 250

## 2020-12-17 MED ORDER — SODIUM CHLORIDE 0.9 % IV SOLN
2000.0000 mg/m2 | INTRAVENOUS | Status: DC
  Administered 2020-12-17: 3400 mg via INTRAVENOUS
  Filled 2020-12-17: qty 68

## 2020-12-17 MED ORDER — PALONOSETRON HCL INJECTION 0.25 MG/5ML
0.2500 mg | Freq: Once | INTRAVENOUS | Status: AC
  Administered 2020-12-17: 0.25 mg via INTRAVENOUS

## 2020-12-17 MED ORDER — SODIUM CHLORIDE 0.9 % IV SOLN
10.0000 mg | Freq: Once | INTRAVENOUS | Status: AC
  Administered 2020-12-17: 10 mg via INTRAVENOUS
  Filled 2020-12-17: qty 10

## 2020-12-17 MED ORDER — LEUCOVORIN CALCIUM INJECTION 350 MG
400.0000 mg/m2 | Freq: Once | INTRAVENOUS | Status: AC
  Administered 2020-12-17: 680 mg via INTRAVENOUS
  Filled 2020-12-17: qty 34

## 2020-12-17 MED ORDER — OXALIPLATIN CHEMO INJECTION 100 MG/20ML
50.0000 mg/m2 | Freq: Once | INTRAVENOUS | Status: AC
  Administered 2020-12-17: 85 mg via INTRAVENOUS
  Filled 2020-12-17: qty 17

## 2020-12-17 NOTE — Progress Notes (Signed)
Per Cira Rue NP, ok to treat with elevated bilirubin and low platelets. Dr. Benay Spice to reduce oxaliplatin dose.

## 2020-12-17 NOTE — Patient Instructions (Signed)
[place] Discharge Instructions for Patients Receiving Chemotherapy  Today you received the following chemotherapy agents: Oxaliplatin, leucovorin, and fluorouracil.  To help prevent nausea and vomiting after your treatment, we encourage you to take your nausea medication as directed.   If you develop nausea and vomiting that is not controlled by your nausea medication, call the clinic.   BELOW ARE SYMPTOMS THAT SHOULD BE REPORTED IMMEDIATELY:  *FEVER GREATER THAN 100.5 F  *CHILLS WITH OR WITHOUT FEVER  NAUSEA AND VOMITING THAT IS NOT CONTROLLED WITH YOUR NAUSEA MEDICATION  *UNUSUAL SHORTNESS OF BREATH  *UNUSUAL BRUISING OR BLEEDING  TENDERNESS IN MOUTH AND THROAT WITH OR WITHOUT PRESENCE OF ULCERS  *URINARY PROBLEMS  *BOWEL PROBLEMS  UNUSUAL RASH Items with * indicate a potential emergency and should be followed up as soon as possible.  Feel free to call the clinic should you have any questions or concerns. The clinic phone number is [phone].  Please show the [place] at check-in to the Emergency Department and triage nurse.

## 2020-12-18 ENCOUNTER — Telehealth: Payer: Self-pay | Admitting: Oncology

## 2020-12-18 NOTE — Telephone Encounter (Signed)
Scheduled follow-up appointments per 2/14 los. Patient is aware. °

## 2020-12-19 ENCOUNTER — Telehealth: Payer: Self-pay

## 2020-12-19 ENCOUNTER — Other Ambulatory Visit: Payer: Self-pay

## 2020-12-19 ENCOUNTER — Inpatient Hospital Stay: Payer: Medicare Other

## 2020-12-19 ENCOUNTER — Ambulatory Visit (HOSPITAL_COMMUNITY)
Admission: RE | Admit: 2020-12-19 | Discharge: 2020-12-19 | Disposition: A | Discharge status: Home / Self Care | Payer: Medicare Other | Source: Ambulatory Visit | Attending: Nurse Practitioner | Admitting: Nurse Practitioner

## 2020-12-19 VITALS — BP 129/98 | HR 90 | Temp 98.2°F | Resp 18

## 2020-12-19 DIAGNOSIS — Z5111 Encounter for antineoplastic chemotherapy: Secondary | ICD-10-CM | POA: Diagnosis not present

## 2020-12-19 DIAGNOSIS — C787 Secondary malignant neoplasm of liver and intrahepatic bile duct: Secondary | ICD-10-CM

## 2020-12-19 DIAGNOSIS — D701 Agranulocytosis secondary to cancer chemotherapy: Secondary | ICD-10-CM | POA: Diagnosis not present

## 2020-12-19 DIAGNOSIS — M79662 Pain in left lower leg: Secondary | ICD-10-CM | POA: Diagnosis not present

## 2020-12-19 DIAGNOSIS — C189 Malignant neoplasm of colon, unspecified: Secondary | ICD-10-CM | POA: Insufficient documentation

## 2020-12-19 DIAGNOSIS — C184 Malignant neoplasm of transverse colon: Secondary | ICD-10-CM | POA: Diagnosis not present

## 2020-12-19 DIAGNOSIS — D6959 Other secondary thrombocytopenia: Secondary | ICD-10-CM | POA: Diagnosis not present

## 2020-12-19 DIAGNOSIS — Z5189 Encounter for other specified aftercare: Secondary | ICD-10-CM | POA: Diagnosis not present

## 2020-12-19 MED ORDER — PEGFILGRASTIM-CBQV 6 MG/0.6ML ~~LOC~~ SOSY
PREFILLED_SYRINGE | SUBCUTANEOUS | Status: AC
  Filled 2020-12-19: qty 0.6

## 2020-12-19 MED ORDER — HEPARIN SOD (PORK) LOCK FLUSH 100 UNIT/ML IV SOLN
500.0000 [IU] | Freq: Once | INTRAVENOUS | Status: AC | PRN
  Administered 2020-12-19: 500 [IU]
  Filled 2020-12-19: qty 5

## 2020-12-19 MED ORDER — SODIUM CHLORIDE 0.9% FLUSH
10.0000 mL | INTRAVENOUS | Status: DC | PRN
  Administered 2020-12-19: 10 mL
  Filled 2020-12-19: qty 10

## 2020-12-19 MED ORDER — PEGFILGRASTIM-CBQV 6 MG/0.6ML ~~LOC~~ SOSY
6.0000 mg | PREFILLED_SYRINGE | Freq: Once | SUBCUTANEOUS | Status: AC
  Administered 2020-12-19: 6 mg via SUBCUTANEOUS

## 2020-12-19 NOTE — Progress Notes (Signed)
Bilateral lower extremity venous duplex has been completed. Preliminary results can be found in CV Proc through chart review.  Results were given to Colletta [place] at [facility] NP office.  12/19/20 2:11 PM Carlos Levering RVT

## 2020-12-19 NOTE — Telephone Encounter (Signed)
Pt is (-) for DVT (+) for [place] Cyst's behind both knees

## 2020-12-19 NOTE — Patient Instructions (Signed)
Pegfilgrastim injection What is this medicine? PEGFILGRASTIM (PEG fil gra stim) is a long-acting granulocyte colony-stimulating factor that stimulates the growth of neutrophils, a type of white blood cell important in the body's fight against infection. It is used to reduce the incidence of fever and infection in patients with certain types of cancer who are receiving chemotherapy that affects the bone marrow, and to increase survival after being exposed to high doses of radiation. This medicine may be used for other purposes; ask your health care provider or pharmacist if you have questions. COMMON BRAND NAME(S): Fulphila, Neulasta, Nyvepria, UDENYCA, Ziextenzo What should I tell my health care provider before I take this medicine? They need to know if you have any of these conditions:  kidney disease  latex allergy  ongoing radiation therapy  sickle cell disease  skin reactions to acrylic adhesives (On-Body Injector only)  an unusual or allergic reaction to pegfilgrastim, filgrastim, other medicines, foods, dyes, or preservatives  pregnant or trying to get pregnant  breast-feeding How should I use this medicine? This medicine is for injection under the skin. If you get this medicine at home, you will be taught how to prepare and give the pre-filled syringe or how to use the On-body Injector. Refer to the patient Instructions for Use for detailed instructions. Use exactly as directed. Tell your healthcare provider immediately if you suspect that the On-body Injector may not have performed as intended or if you suspect the use of the On-body Injector resulted in a missed or partial dose. It is important that you put your used needles and syringes in a special sharps container. Do not put them in a trash can. If you do not have a sharps container, call your pharmacist or healthcare provider to get one. Talk to your pediatrician regarding the use of this medicine in children. While this drug  may be prescribed for selected conditions, precautions do apply. Overdosage: If you think you have taken too much of this medicine contact a poison control center or emergency room at once. NOTE: This medicine is only for you. Do not share this medicine with others. What if I miss a dose? It is important not to miss your dose. Call your doctor or health care professional if you miss your dose. If you miss a dose due to an On-body Injector failure or leakage, a new dose should be administered as soon as possible using a single prefilled syringe for manual use. What may interact with this medicine? Interactions have not been studied. This list may not describe all possible interactions. Give your health care provider a list of all the medicines, herbs, non-prescription drugs, or dietary supplements you use. Also tell them if you smoke, drink alcohol, or use illegal drugs. Some items may interact with your medicine. What should I watch for while using this medicine? Your condition will be monitored carefully while you are receiving this medicine. You may need blood work done while you are taking this medicine. Talk to your health care provider about your risk of cancer. You may be more at risk for certain types of cancer if you take this medicine. If you are going to need a MRI, CT scan, or other procedure, tell your doctor that you are using this medicine (On-Body Injector only). What side effects may I notice from receiving this medicine? Side effects that you should report to your doctor or health care professional as soon as possible:  allergic reactions (skin rash, itching or hives, swelling of   the face, lips, or tongue)  back pain  dizziness  fever  pain, redness, or irritation at site where injected  pinpoint red spots on the skin  red or dark-brown urine  shortness of breath or breathing problems  stomach or side pain, or pain at the shoulder  swelling  tiredness  trouble  passing urine or change in the amount of urine  unusual bruising or bleeding Side effects that usually do not require medical attention (report to your doctor or health care professional if they continue or are bothersome):  bone pain  muscle pain This list may not describe all possible side effects. Call your doctor for medical advice about side effects. You may report side effects to FDA at 1-800-FDA-1088. Where should I keep my medicine? Keep out of the reach of children. If you are using this medicine at home, you will be instructed on how to store it. Throw away any unused medicine after the expiration date on the label. NOTE: This sheet is a summary. It may not cover all possible information. If you have questions about this medicine, talk to your doctor, pharmacist, or health care provider.  2021 Elsevier/Gold Standard (2019-11-11 13:20:51) Implanted [facility] Guide An implanted port is a device that is placed under the skin. It is usually placed in the chest. The device can be used to give IV medicine, to take blood, or for dialysis. You may have an implanted port if:  You need IV medicine that would be irritating to the small veins in your hands or arms.  You need IV medicines, such as antibiotics, for a long period of time.  You need IV nutrition for a long period of time.  You need dialysis. When you have a port, your health care provider can choose to use the port instead of veins in your arms for these procedures. You may have fewer limitations when using a port than you would if you used other types of long-term IVs, and you will likely be able to return to normal activities after your incision heals. An implanted port has two main parts:  Reservoir. The reservoir is the part where a needle is inserted to give medicines or draw blood. The reservoir is round. After it is placed, it appears as a small, raised area under your skin.  Catheter. The catheter is a thin, flexible  tube that connects the reservoir to a vein. Medicine that is inserted into the reservoir goes into the catheter and then into the vein. How is my port accessed? To access your port:  A numbing cream may be placed on the skin over the port site.  Your health care provider will put on a mask and sterile gloves.  The skin over your port will be cleaned carefully with a germ-killing soap and allowed to dry.  Your health care provider will gently pinch the port and insert a needle into it.  Your health care provider will check for a blood return to make sure the port is in the vein and is not clogged.  If your port needs to remain accessed to get medicine continuously (constant infusion), your health care provider will place a clear bandage (dressing) over the needle site. The dressing and needle will need to be changed every week, or as told by your health care provider. What is flushing? Flushing helps keep the port from getting clogged. Follow instructions from your health care provider about how and when to flush the port. Ports are  usually flushed with saline solution or a medicine called heparin. The need for flushing will depend on how the port is used:  If the port is only used from time to time to give medicines or draw blood, the port may need to be flushed: ? Before and after medicines have been given. ? Before and after blood has been drawn. ? As part of routine maintenance. Flushing may be recommended every 4-6 weeks.  If a constant infusion is running, the port may not need to be flushed.  Throw away any syringes in a disposal container that is meant for sharp items (sharps container). You can buy a sharps container from a pharmacy, or you can make one by using an empty hard plastic bottle with a cover. How long will my port stay implanted? The port can stay in for as long as your health care provider thinks it is needed. When it is time for the port to come out, a surgery will be  done to remove it. The surgery will be similar to the procedure that was done to put the port in. Follow these instructions at home:  Flush your port as told by your health care provider.  If you need an infusion over several days, follow instructions from your health care provider about how to take care of your port site. Make sure you: ? Wash your hands with soap and water before you change your dressing. If soap and water are not available, use alcohol-based hand sanitizer. ? Change your dressing as told by your health care provider. ? Place any used dressings or infusion bags into a plastic bag. Throw that bag in the trash. ? Keep the dressing that covers the needle clean and dry. Do not get it wet. ? Do not use scissors or sharp objects near the tube. ? Keep the tube clamped, unless it is being used.  Check your port site every day for signs of infection. Check for: ? Redness, swelling, or pain. ? Fluid or blood. ? Pus or a bad smell.  Protect the skin around the port site. ? Avoid wearing bra straps that rub or irritate the site. ? Protect the skin around your port from seat belts. Place a soft pad over your chest if needed.  Bathe or shower as told by your health care provider. The site may get wet as long as you are not actively receiving an infusion.  Return to your normal activities as told by your health care provider. Ask your health care provider what activities are safe for you.  Carry a medical alert card or wear a medical alert bracelet at all times. This will let health care providers know that you have an implanted port in case of an emergency.   Get help right away if:  You have redness, swelling, or pain at the port site.  You have fluid or blood coming from your port site.  You have pus or a bad smell coming from the port site.  You have a fever. Summary  Implanted ports are usually placed in the chest for long-term IV access.  Follow instructions from  your health care provider about flushing the port and changing bandages (dressings).  Take care of the area around your port by avoiding clothing that puts pressure on the area, and by watching for signs of infection.  Protect the skin around your port from seat belts. Place a soft pad over your chest if needed.  Get help right away  if you have a fever or you have redness, swelling, pain, drainage, or a bad smell at the port site. This information is not intended to replace advice given to you by your health care provider. Make sure you discuss any questions you have with your health care provider. Document Revised: 03/05/2020 Document Reviewed: 03/05/2020 Elsevier Patient Education  [place].

## 2020-12-30 ENCOUNTER — Other Ambulatory Visit: Payer: Self-pay | Admitting: Oncology

## 2020-12-31 MED FILL — Dexamethasone Sodium Phosphate Inj 100 MG/10ML: INTRAMUSCULAR | Qty: 1 | Status: AC

## 2021-01-01 ENCOUNTER — Inpatient Hospital Stay: Payer: Medicare Other | Attending: Oncology | Admitting: Nurse Practitioner

## 2021-01-01 ENCOUNTER — Inpatient Hospital Stay: Payer: Medicare Other

## 2021-01-01 ENCOUNTER — Telehealth: Payer: Self-pay

## 2021-01-01 ENCOUNTER — Other Ambulatory Visit: Payer: Self-pay

## 2021-01-01 ENCOUNTER — Encounter: Payer: Self-pay | Admitting: Nurse Practitioner

## 2021-01-01 VITALS — BP 132/94 | HR 100 | Temp 97.8°F | Resp 18 | Ht 68.0 in | Wt 132.7 lb

## 2021-01-01 DIAGNOSIS — C787 Secondary malignant neoplasm of liver and intrahepatic bile duct: Secondary | ICD-10-CM | POA: Diagnosis not present

## 2021-01-01 DIAGNOSIS — Z5189 Encounter for other specified aftercare: Secondary | ICD-10-CM | POA: Insufficient documentation

## 2021-01-01 DIAGNOSIS — Z95828 Presence of other vascular implants and grafts: Secondary | ICD-10-CM

## 2021-01-01 DIAGNOSIS — I10 Essential (primary) hypertension: Secondary | ICD-10-CM | POA: Insufficient documentation

## 2021-01-01 DIAGNOSIS — E039 Hypothyroidism, unspecified: Secondary | ICD-10-CM | POA: Insufficient documentation

## 2021-01-01 DIAGNOSIS — T451X5A Adverse effect of antineoplastic and immunosuppressive drugs, initial encounter: Secondary | ICD-10-CM | POA: Diagnosis not present

## 2021-01-01 DIAGNOSIS — D6959 Other secondary thrombocytopenia: Secondary | ICD-10-CM | POA: Diagnosis not present

## 2021-01-01 DIAGNOSIS — C189 Malignant neoplasm of colon, unspecified: Secondary | ICD-10-CM | POA: Diagnosis not present

## 2021-01-01 DIAGNOSIS — Z7901 Long term (current) use of anticoagulants: Secondary | ICD-10-CM | POA: Insufficient documentation

## 2021-01-01 DIAGNOSIS — D701 Agranulocytosis secondary to cancer chemotherapy: Secondary | ICD-10-CM | POA: Insufficient documentation

## 2021-01-01 DIAGNOSIS — Z5111 Encounter for antineoplastic chemotherapy: Secondary | ICD-10-CM | POA: Diagnosis not present

## 2021-01-01 DIAGNOSIS — I4891 Unspecified atrial fibrillation: Secondary | ICD-10-CM | POA: Diagnosis not present

## 2021-01-01 DIAGNOSIS — Z79899 Other long term (current) drug therapy: Secondary | ICD-10-CM | POA: Diagnosis not present

## 2021-01-01 DIAGNOSIS — C184 Malignant neoplasm of transverse colon: Secondary | ICD-10-CM | POA: Diagnosis not present

## 2021-01-01 LAB — CBC WITH DIFFERENTIAL (CANCER CENTER ONLY)
Abs Immature Granulocytes: 0.12 10*3/uL — ABNORMAL HIGH (ref 0.00–0.07)
Basophils Absolute: 0.1 10*3/uL (ref 0.0–0.1)
Basophils Relative: 1 %
Eosinophils Absolute: 0 10*3/uL (ref 0.0–0.5)
Eosinophils Relative: 0 %
HCT: 31.2 % — ABNORMAL LOW (ref 39.0–52.0)
Hemoglobin: 9.9 g/dL — ABNORMAL LOW (ref 13.0–17.0)
Immature Granulocytes: 2 %
Lymphocytes Relative: 11 %
Lymphs Abs: 0.8 10*3/uL (ref 0.7–4.0)
MCH: 34 pg (ref 26.0–34.0)
MCHC: 31.7 g/dL (ref 30.0–36.0)
MCV: 107.2 fL — ABNORMAL HIGH (ref 80.0–100.0)
Monocytes Absolute: 0.6 10*3/uL (ref 0.1–1.0)
Monocytes Relative: 8 %
Neutro Abs: 6 10*3/uL (ref 1.7–7.7)
Neutrophils Relative %: 78 %
Platelet Count: 56 10*3/uL — ABNORMAL LOW (ref 150–400)
RBC: 2.91 MIL/uL — ABNORMAL LOW (ref 4.22–5.81)
RDW: 20.6 % — ABNORMAL HIGH (ref 11.5–15.5)
WBC Count: 7.7 10*3/uL (ref 4.0–10.5)
nRBC: 0 % (ref 0.0–0.2)

## 2021-01-01 LAB — CMP (CANCER CENTER ONLY)
ALT: 17 U/L (ref 0–44)
AST: 42 U/L — ABNORMAL HIGH (ref 15–41)
Albumin: 3 g/dL — ABNORMAL LOW (ref 3.5–5.0)
Alkaline Phosphatase: 108 U/L (ref 38–126)
Anion gap: 9 (ref 5–15)
BUN: 16 mg/dL (ref 8–23)
CO2: 22 mmol/L (ref 22–32)
Calcium: 8.3 mg/dL — ABNORMAL LOW (ref 8.9–10.3)
Chloride: 113 mmol/L — ABNORMAL HIGH (ref 98–111)
Creatinine: 1.1 mg/dL (ref 0.61–1.24)
GFR, Estimated: >60 mL/min (ref >60)
Glucose, Bld: 101 mg/dL — ABNORMAL HIGH (ref 70–99)
Potassium: 4 mmol/L (ref 3.5–5.1)
Sodium: 144 mmol/L (ref 135–145)
Total Bilirubin: 2 mg/dL — ABNORMAL HIGH (ref 0.3–1.2)
Total Protein: 5.6 g/dL — ABNORMAL LOW (ref 6.5–8.1)

## 2021-01-01 LAB — CEA (IN HOUSE-CHCC): CEA (CHCC-In House): 1862.46 ng/mL — ABNORMAL HIGH (ref 0.00–5.00)

## 2021-01-01 MED ORDER — SODIUM CHLORIDE 0.9% FLUSH
10.0000 mL | INTRAVENOUS | Status: DC | PRN
  Administered 2021-01-01: 10 mL via INTRAVENOUS
  Filled 2021-01-01: qty 10

## 2021-01-01 NOTE — Telephone Encounter (Signed)
Pt made aware of most recent lab results CEA and knows to follow up as scheduled

## 2021-01-01 NOTE — Telephone Encounter (Signed)
-----   Message from Owens Shark, NP sent at 01/01/2021  1:41 PM EST ----- Please let him know the CEA is better.  Follow-up as scheduled next week.  Call with any bleeding.

## 2021-01-01 NOTE — Progress Notes (Signed)
[place] OFFICE PROGRESS NOTE   Diagnosis: Colon cancer  INTERVAL HISTORY:   [name] returns as scheduled.  He completed cycle 4 FOLFOX 12/17/2020.  He denies nausea/vomiting.  No mouth sores.  No diarrhea.  Cold sensitivity lasted about 10 days.  No persistent neuropathy symptoms.  He reports a good appetite.  He does feel a little weaker than he was 2 weeks ago.  He denies bleeding.  He had a fall 2 days ago, stating he turned too quickly.  He is now walking with a cane.  Objective:  Vital signs in last 24 hours:  Blood pressure (!) 132/94, pulse 100, temperature 97.8 F (36.6 C), temperature source Tympanic, resp. rate 18, height 5' 8" (1.727 m), weight 132 lb 11.2 oz (60.2 kg), SpO2 99 %.    HEENT: No thrush or ulcers. Resp: Lungs clear bilaterally. Cardio: Irregular. GI: Abdomen soft and nontender.  No definite hepatomegaly. Vascular: Pitting edema at the lower leg bilaterally. Skin: Ecchymosis base of left thumb extending to the anterior wrist region. Port-A-Cath without erythema.  Lab Results:  Lab Results  Component Value Date   WBC 7.7 01/01/2021   HGB 9.9 (L) 01/01/2021   HCT 31.2 (L) 01/01/2021   MCV 107.2 (H) 01/01/2021   PLT 56 (L) 01/01/2021   NEUTROABS 6.0 01/01/2021    Imaging:  No results found.  Medications: I have reviewed the patient's current medications.  Assessment/Plan: 1. Colon cancer-transverse, stage IIIb (T3N1), status post a segmental colectomy 04/13/2020 ? 1/10 lymph nodes, lymphovascular invasion present, MSS, resection margins negative for invasive cancer, "closest" margin positive for low-grade dysplasia; microsatellite stable, tumor mutation burden 3, KRAS G12D ? Colonoscopy 02/09/2020-ulcerated nonobstructing mass at the splenic flexure, 4 mm polyp of the rectum, splenic flexure lesion-adenocarcinoma with intact mismatch repair protein expression, tubular adenoma at the rectal polyp ? CT abdomen/pelvis 02/16/2020-apple  core lesion at the distal transverse colon, no evidence of metastatic disease ? CT chest 03/06/2020-no evidence of metastatic disease, stable benign lung nodules ? CT abdomen/pelvis 09/20/2020-interval development of widespread metastatic disease to the liver with multiple hypervascular hepatic masses, largest measuring 8.6 x 6.2 x 6.2 cm. Enlarged gastrohepatic ligament lymph node. ? Cycle 1 FOLFOX 10/11/2020 ? Cycle 2 FOLFOX 11/10/2020, Udenyca ? Cycle 3 FOLFOX 11/26/2020, oxaliplatin dose decreased secondary to thrombocytopenia ? Cycle 4 FOLFOX 12/17/2020, postponed and oxaliplatin dose decreased secondary to progressive thrombocytopenia  ? Chemotherapy held due to thrombocytopenia 01/01/2021 2. Rectal bleeding secondary to #1 3. Anemia-likely secondary to GI bleeding 4. Atrial fibrillation, maintained on apixaban 5. BPH 6. Glaucoma 7. Hypertension 8. Hypothyroid 9. History of a CVA 10. Positive resection margin for low-grade dysplasia on the colectomy specimen 04/13/2020 11. Neutropenia following cycle 1 FOLFOX, Udenyca added with cycle 2 12. Thrombocytopenia, secondary to chemotherapy  13. Lower extremity venous Doppler 12/19/2020-no evidence of DVT right lower extremity, left lower extremity.   Disposition: Mr. Stepanek has completed four cycles of FOLFOX.  Oxaliplatin has been dose reduced due to thrombocytopenia.  He has progressive thrombocytopenia on labs today.  We are holding cycle 5 FOLFOX today and rescheduling for 1 week.  He understands to contact the office with bleeding.  The plan is for CT scans after he has completed five cycles.  He will return for lab, follow-up, FOLFOX in approximately 1 week.  He will contact the office in the interim with any problems.  Plan reviewed with Dr. Benay Spice.    Ned Card ANP/GNP-BC   01/01/2021  10:51 AM

## 2021-01-02 ENCOUNTER — Telehealth: Payer: Self-pay | Admitting: *Deleted

## 2021-01-02 NOTE — Telephone Encounter (Signed)
Per staff message 01/02/21 scheduled patient for tx @ [facility] for 01/10/21 @ 12:30 p.m. [place] to notify patient.

## 2021-01-03 ENCOUNTER — Telehealth: Payer: Self-pay | Admitting: Nurse Practitioner

## 2021-01-03 ENCOUNTER — Inpatient Hospital Stay: Payer: Medicare Other

## 2021-01-03 NOTE — Telephone Encounter (Signed)
Scheduled appointments per 3/1 los. Called patient, no answer. Left message with appointments dates and times. Also let patient know due to limited availability infusion was scheduled in [place]. Okay to schedule this way per Ned Card. Left instructions for patient to call back to confirm appointments.

## 2021-01-04 ENCOUNTER — Telehealth: Payer: Self-pay | Admitting: *Deleted

## 2021-01-04 NOTE — Telephone Encounter (Signed)
Patient asking for his labs to be drawn at [facility] on the day prior to chemo instead of driving to [place]. Wants to be sure his platelet count is adequate before driving 1 hour here. Informed him that it is very likely his counts are adequate on 3/9 and he must see MD that day. We can definitely have his labs done at [facility] on 01/14/21 prior to next treatment. Will discuss w/MD on 01/09/21

## 2021-01-05 ENCOUNTER — Inpatient Hospital Stay: Payer: Medicare Other

## 2021-01-09 ENCOUNTER — Other Ambulatory Visit: Payer: Self-pay

## 2021-01-09 ENCOUNTER — Inpatient Hospital Stay: Payer: Medicare Other

## 2021-01-09 ENCOUNTER — Inpatient Hospital Stay (HOSPITAL_BASED_OUTPATIENT_CLINIC_OR_DEPARTMENT_OTHER): Payer: Medicare Other | Admitting: Oncology

## 2021-01-09 VITALS — BP 137/99 | HR 86 | Temp 98.7°F | Resp 18 | Ht 68.0 in | Wt 134.4 lb

## 2021-01-09 DIAGNOSIS — C189 Malignant neoplasm of colon, unspecified: Secondary | ICD-10-CM

## 2021-01-09 DIAGNOSIS — Z5189 Encounter for other specified aftercare: Secondary | ICD-10-CM | POA: Diagnosis not present

## 2021-01-09 DIAGNOSIS — C787 Secondary malignant neoplasm of liver and intrahepatic bile duct: Secondary | ICD-10-CM

## 2021-01-09 DIAGNOSIS — D6959 Other secondary thrombocytopenia: Secondary | ICD-10-CM | POA: Diagnosis not present

## 2021-01-09 DIAGNOSIS — Z5111 Encounter for antineoplastic chemotherapy: Secondary | ICD-10-CM | POA: Diagnosis not present

## 2021-01-09 DIAGNOSIS — D701 Agranulocytosis secondary to cancer chemotherapy: Secondary | ICD-10-CM | POA: Diagnosis not present

## 2021-01-09 DIAGNOSIS — C184 Malignant neoplasm of transverse colon: Secondary | ICD-10-CM | POA: Diagnosis not present

## 2021-01-09 LAB — CMP (CANCER CENTER ONLY)
ALT: 16 U/L (ref 0–44)
AST: 43 U/L — ABNORMAL HIGH (ref 15–41)
Albumin: 2.9 g/dL — ABNORMAL LOW (ref 3.5–5.0)
Alkaline Phosphatase: 104 U/L (ref 38–126)
Anion gap: 8 (ref 5–15)
BUN: 18 mg/dL (ref 8–23)
CO2: 22 mmol/L (ref 22–32)
Calcium: 8.4 mg/dL — ABNORMAL LOW (ref 8.9–10.3)
Chloride: 111 mmol/L (ref 98–111)
Creatinine: 0.83 mg/dL (ref 0.61–1.24)
GFR, Estimated: >60 mL/min (ref >60)
Glucose, Bld: 88 mg/dL (ref 70–99)
Potassium: 4.2 mmol/L (ref 3.5–5.1)
Sodium: 141 mmol/L (ref 135–145)
Total Bilirubin: 1.9 mg/dL — ABNORMAL HIGH (ref 0.3–1.2)
Total Protein: 5.5 g/dL — ABNORMAL LOW (ref 6.5–8.1)

## 2021-01-09 LAB — CBC WITH DIFFERENTIAL (CANCER CENTER ONLY)
Abs Immature Granulocytes: 0.02 10*3/uL (ref 0.00–0.07)
Basophils Absolute: 0.1 10*3/uL (ref 0.0–0.1)
Basophils Relative: 1 %
Eosinophils Absolute: 0 10*3/uL (ref 0.0–0.5)
Eosinophils Relative: 1 %
HCT: 31.6 % — ABNORMAL LOW (ref 39.0–52.0)
Hemoglobin: 10.1 g/dL — ABNORMAL LOW (ref 13.0–17.0)
Immature Granulocytes: 1 %
Lymphocytes Relative: 15 %
Lymphs Abs: 0.7 10*3/uL (ref 0.7–4.0)
MCH: 34 pg (ref 26.0–34.0)
MCHC: 32 g/dL (ref 30.0–36.0)
MCV: 106.4 fL — ABNORMAL HIGH (ref 80.0–100.0)
Monocytes Absolute: 0.6 10*3/uL (ref 0.1–1.0)
Monocytes Relative: 13 %
Neutro Abs: 3 10*3/uL (ref 1.7–7.7)
Neutrophils Relative %: 69 %
Platelet Count: 71 10*3/uL — ABNORMAL LOW (ref 150–400)
RBC: 2.97 MIL/uL — ABNORMAL LOW (ref 4.22–5.81)
RDW: 19.9 % — ABNORMAL HIGH (ref 11.5–15.5)
WBC Count: 4.4 10*3/uL (ref 4.0–10.5)
nRBC: 0 % (ref 0.0–0.2)

## 2021-01-09 LAB — CEA (IN HOUSE-CHCC): CEA (CHCC-In House): 1926.26 ng/mL — ABNORMAL HIGH (ref 0.00–5.00)

## 2021-01-09 MED ORDER — LEUCOVORIN CALCIUM INJECTION 350 MG
400.0000 mg/m2 | Freq: Once | INTRAVENOUS | Status: AC
  Administered 2021-01-09: 680 mg via INTRAVENOUS
  Filled 2021-01-09: qty 34

## 2021-01-09 MED ORDER — SODIUM CHLORIDE 0.9 % IV SOLN
10.0000 mg | Freq: Once | INTRAVENOUS | Status: AC
  Administered 2021-01-09: 10 mg via INTRAVENOUS
  Filled 2021-01-09: qty 10

## 2021-01-09 MED ORDER — SODIUM CHLORIDE 0.9 % IV SOLN
2000.0000 mg/m2 | INTRAVENOUS | Status: DC
  Administered 2021-01-09: 3400 mg via INTRAVENOUS
  Filled 2021-01-09: qty 68

## 2021-01-09 MED ORDER — PALONOSETRON HCL INJECTION 0.25 MG/5ML
INTRAVENOUS | Status: AC
  Filled 2021-01-09: qty 5

## 2021-01-09 MED ORDER — PALONOSETRON HCL INJECTION 0.25 MG/5ML
0.2500 mg | Freq: Once | INTRAVENOUS | Status: AC
Start: 2021-01-09 — End: 2021-01-09
  Administered 2021-01-09: 0.25 mg via INTRAVENOUS

## 2021-01-09 MED ORDER — DEXTROSE 5 % IV SOLN
Freq: Once | INTRAVENOUS | Status: AC
  Filled 2021-01-09: qty 250

## 2021-01-09 MED ORDER — APIXABAN 2.5 MG PO TABS: 2.5000 mg | ORAL_TABLET | Freq: Two times a day (BID) | ORAL | 3 refills | Status: DC

## 2021-01-09 MED ORDER — OXALIPLATIN CHEMO INJECTION 100 MG/20ML
40.0000 mg/m2 | Freq: Once | INTRAVENOUS | Status: AC
  Administered 2021-01-09: 70 mg via INTRAVENOUS
  Filled 2021-01-09: qty 14

## 2021-01-09 NOTE — Progress Notes (Signed)
Dylan York   Diagnosis: Colon cancer  INTERVAL HISTORY:   Dylan York returns as scheduled.  He reports a good appetite.  No neuropathy symptoms.  He has purpuric areas at the lower legs.  He is caring for his wife.  Objective:  Vital signs in last 24 hours:  Blood pressure (!) 137/99, pulse 86, temperature 98.7 F (37.1 C), temperature source Tympanic, resp. rate 18, height _0  (1.727 m), weight 134 lb 6.4 oz (61 kg), SpO2 97 %.    HEENT: White coat over the tongue, no buccal thrush, no bleeding Resp: Lungs clear bilaterally Cardio: Irregular, 2/6 systolic murmur GI: The liver is palpable in the right upper abdomen, no splenomegaly Vascular: 1+ pitting edema to lower leg bilaterally  Skin: Dryness with abrasions/ecchymoses at the right greater than left lower leg  Portacath/PICC-without erythema  Lab Results:  Lab Results  Component Value Date   WBC 4.4 01/09/2021   HGB 10.1 (L) 01/09/2021   HCT 31.6 (L) 01/09/2021   MCV 106.4 (H) 01/09/2021   PLT 71 (L) 01/09/2021   NEUTROABS 3.0 01/09/2021    CMP  Lab Results  Component Value Date   NA 141 01/09/2021   K 4.2 01/09/2021   CL 111 01/09/2021   CO2 22 01/09/2021   GLUCOSE 88 01/09/2021   BUN 18 01/09/2021   CREATININE 0.83 01/09/2021   CALCIUM 8.4 (L) 01/09/2021   PROT 5.5 (L) 01/09/2021   ALBUMIN 2.9 (L) 01/09/2021   AST 43 (H) 01/09/2021   ALT 16 01/09/2021   ALKPHOS 104 01/09/2021   BILITOT 1.9 (H) 01/09/2021   GFRNONAA >60 01/09/2021   GFRAA 54 (L) 08/02/2020    Lab Results  Component Value Date   CEA1 1,862.46 (H) 01/01/2021     Medications: I have reviewed the patient's current medications.   Assessment/Plan: 1. Colon cancer-transverse, stage IIIb (T3N1), status post a segmental colectomy 04/13/2020 ? 1/10 lymph nodes, lymphovascular invasion present, MSS, resection margins negative for invasive cancer, "closest" margin positive for low-grade dysplasia;  microsatellite stable, tumor mutation burden 3, KRAS G12D ? Colonoscopy 02/09/2020-ulcerated nonobstructing mass at the splenic flexure, 4 mm polyp of the rectum, splenic flexure lesion-adenocarcinoma with intact mismatch repair protein expression, tubular adenoma at the rectal polyp ? CT abdomen/pelvis 02/16/2020-apple core lesion at the distal transverse colon, no evidence of metastatic disease ? CT chest 03/06/2020-no evidence of metastatic disease, stable benign lung nodules ? CT abdomen/pelvis 09/20/2020-interval development of widespread metastatic disease to the liver with multiple hypervascular hepatic masses, largest measuring 8.6 x 6.2 x 6.2 cm. Enlarged gastrohepatic ligament lymph node. ? Cycle 1 FOLFOX 10/11/2020 ? Cycle 2 FOLFOX 11/10/2020, Udenyca ? Cycle 3 FOLFOX 11/26/2020, oxaliplatin dose decreased secondary to thrombocytopenia ? Cycle 4 FOLFOX 12/17/2020, postponed and oxaliplatin dose decreased secondary to progressive thrombocytopenia  ? Chemotherapy held due to thrombocytopenia 01/01/2021 ? Cycle 5 FOLFOX 01/09/2021-oxaliplatin dose reduced secondary to thrombocytopenia 2. Rectal bleeding secondary to #1 3. Anemia-likely secondary to GI bleeding 4. Atrial fibrillation, maintained on apixaban 5. BPH 6. Glaucoma 7. Hypertension 8. Hypothyroid 9. History of a CVA 10. Positive resection margin for low-grade dysplasia on the colectomy specimen 04/13/2020 11. Neutropenia following cycle 1 FOLFOX, Udenyca added with cycle 2 12. Thrombocytopenia, secondary to chemotherapy  13. Lower extremity venous Doppler 12/19/2020-no evidence of DVT right lower extremity, left lower extremity.     Disposition: Dylan York appears stable.  The platelet count remains low, but has improved compared to last week.  The plan is to proceed with cycle 5 FOLFOX today.  Oxaliplatin will be further dose reduced.  He is at risk for bleeding secondary to thrombocytopenia and apixaban anticoagulation.  We will dose  reduce the apixaban.  Dylan York will undergo a restaging CT after this cycle of chemotherapy.  He will return for an office visit in 2 weeks.  Betsy Coder, MD  01/09/2021  10:14 AM

## 2021-01-09 NOTE — Progress Notes (Signed)
Ok to treat with plts 71 and total bili 1.9 per Donneta Romberg, MD.

## 2021-01-09 NOTE — Patient Instructions (Signed)
[facility] Discharge Instructions for Patients Receiving Chemotherapy  Today you received the following chemotherapy agents: Oxaliplatin, leucovorin, and fluorouracil.  To help prevent nausea and vomiting after your treatment, we encourage you to take your nausea medication as directed.   If you develop nausea and vomiting that is not controlled by your nausea medication, call the clinic.   BELOW ARE SYMPTOMS THAT SHOULD BE REPORTED IMMEDIATELY:  *FEVER GREATER THAN 100.5 F  *CHILLS WITH OR WITHOUT FEVER  NAUSEA AND VOMITING THAT IS NOT CONTROLLED WITH YOUR NAUSEA MEDICATION  *UNUSUAL SHORTNESS OF BREATH  *UNUSUAL BRUISING OR BLEEDING  TENDERNESS IN MOUTH AND THROAT WITH OR WITHOUT PRESENCE OF ULCERS  *URINARY PROBLEMS  *BOWEL PROBLEMS  UNUSUAL RASH Items with * indicate a potential emergency and should be followed up as soon as possible.  Feel free to call the clinic should you have any questions or concerns. The clinic phone number is [phone].  Please show the [facility] at check-in to the Emergency Department and triage nurse.

## 2021-01-09 NOTE — Patient Instructions (Signed)
Stop taking Eliquis 5 mg twice daily and begin Eliquis 2.5 mg twice daily (reduced dose). Your new script has been sent to your pharmacy.

## 2021-01-10 ENCOUNTER — Inpatient Hospital Stay: Payer: Medicare Other

## 2021-01-10 ENCOUNTER — Telehealth: Payer: Self-pay | Admitting: Oncology

## 2021-01-10 ENCOUNTER — Other Ambulatory Visit: Payer: Medicare Other

## 2021-01-10 ENCOUNTER — Ambulatory Visit: Payer: Medicare Other | Admitting: Oncology

## 2021-01-10 NOTE — Telephone Encounter (Signed)
Scheduled appointments per 3/9 los. Spoke to patient who is aware of appointments date and times.  

## 2021-01-11 ENCOUNTER — Other Ambulatory Visit: Payer: Self-pay

## 2021-01-11 ENCOUNTER — Inpatient Hospital Stay: Payer: Medicare Other

## 2021-01-11 VITALS — BP 134/105 | HR 94 | Temp 99.1°F | Resp 16

## 2021-01-11 DIAGNOSIS — C787 Secondary malignant neoplasm of liver and intrahepatic bile duct: Secondary | ICD-10-CM

## 2021-01-11 DIAGNOSIS — D6959 Other secondary thrombocytopenia: Secondary | ICD-10-CM | POA: Diagnosis not present

## 2021-01-11 DIAGNOSIS — Z5111 Encounter for antineoplastic chemotherapy: Secondary | ICD-10-CM | POA: Diagnosis not present

## 2021-01-11 DIAGNOSIS — C184 Malignant neoplasm of transverse colon: Secondary | ICD-10-CM | POA: Diagnosis not present

## 2021-01-11 DIAGNOSIS — C189 Malignant neoplasm of colon, unspecified: Secondary | ICD-10-CM

## 2021-01-11 DIAGNOSIS — D701 Agranulocytosis secondary to cancer chemotherapy: Secondary | ICD-10-CM | POA: Diagnosis not present

## 2021-01-11 DIAGNOSIS — Z5189 Encounter for other specified aftercare: Secondary | ICD-10-CM | POA: Diagnosis not present

## 2021-01-11 MED ORDER — PEGFILGRASTIM-CBQV 6 MG/0.6ML ~~LOC~~ SOSY
PREFILLED_SYRINGE | SUBCUTANEOUS | Status: AC
  Filled 2021-01-11: qty 0.6

## 2021-01-11 MED ORDER — SODIUM CHLORIDE 0.9% FLUSH
10.0000 mL | INTRAVENOUS | Status: DC | PRN
  Administered 2021-01-11: 10 mL
  Filled 2021-01-11: qty 10

## 2021-01-11 MED ORDER — HEPARIN SOD (PORK) LOCK FLUSH 100 UNIT/ML IV SOLN
500.0000 [IU] | Freq: Once | INTRAVENOUS | Status: AC | PRN
  Administered 2021-01-11: 500 [IU]
  Filled 2021-01-11: qty 5

## 2021-01-11 MED ORDER — PEGFILGRASTIM-CBQV 6 MG/0.6ML ~~LOC~~ SOSY
6.0000 mg | PREFILLED_SYRINGE | Freq: Once | SUBCUTANEOUS | Status: AC
  Administered 2021-01-11: 6 mg via SUBCUTANEOUS

## 2021-01-11 NOTE — Patient Instructions (Signed)
Pegfilgrastim injection What is this medicine? PEGFILGRASTIM (PEG fil gra stim) is a long-acting granulocyte colony-stimulating factor that stimulates the growth of neutrophils, a type of white blood cell important in the body's fight against infection. It is used to reduce the incidence of fever and infection in patients with certain types of cancer who are receiving chemotherapy that affects the bone marrow, and to increase survival after being exposed to high doses of radiation. This medicine may be used for other purposes; ask your health care provider or pharmacist if you have questions. COMMON BRAND NAME(S): Fulphila, Neulasta, Nyvepria, UDENYCA, Ziextenzo What should I tell my health care provider before I take this medicine? They need to know if you have any of these conditions:  kidney disease  latex allergy  ongoing radiation therapy  sickle cell disease  skin reactions to acrylic adhesives (On-Body Injector only)  an unusual or allergic reaction to pegfilgrastim, filgrastim, other medicines, foods, dyes, or preservatives  pregnant or trying to get pregnant  breast-feeding How should I use this medicine? This medicine is for injection under the skin. If you get this medicine at home, you will be taught how to prepare and give the pre-filled syringe or how to use the On-body Injector. Refer to the patient Instructions for Use for detailed instructions. Use exactly as directed. Tell your healthcare provider immediately if you suspect that the On-body Injector may not have performed as intended or if you suspect the use of the On-body Injector resulted in a missed or partial dose. It is important that you put your used needles and syringes in a special sharps container. Do not put them in a trash can. If you do not have a sharps container, call your pharmacist or healthcare provider to get one. Talk to your pediatrician regarding the use of this medicine in children. While this drug  may be prescribed for selected conditions, precautions do apply. Overdosage: If you think you have taken too much of this medicine contact a poison control center or emergency room at once. NOTE: This medicine is only for you. Do not share this medicine with others. What if I miss a dose? It is important not to miss your dose. Call your doctor or health care professional if you miss your dose. If you miss a dose due to an On-body Injector failure or leakage, a new dose should be administered as soon as possible using a single prefilled syringe for manual use. What may interact with this medicine? Interactions have not been studied. This list may not describe all possible interactions. Give your health care provider a list of all the medicines, herbs, non-prescription drugs, or dietary supplements you use. Also tell them if you smoke, drink alcohol, or use illegal drugs. Some items may interact with your medicine. What should I watch for while using this medicine? Your condition will be monitored carefully while you are receiving this medicine. You may need blood work done while you are taking this medicine. Talk to your health care provider about your risk of cancer. You may be more at risk for certain types of cancer if you take this medicine. If you are going to need a MRI, CT scan, or other procedure, tell your doctor that you are using this medicine (On-Body Injector only). What side effects may I notice from receiving this medicine? Side effects that you should report to your doctor or health care professional as soon as possible:  allergic reactions (skin rash, itching or hives, swelling of   the face, lips, or tongue)  back pain  dizziness  fever  pain, redness, or irritation at site where injected  pinpoint red spots on the skin  red or dark-brown urine  shortness of breath or breathing problems  stomach or side pain, or pain at the shoulder  swelling  tiredness  trouble  passing urine or change in the amount of urine  unusual bruising or bleeding Side effects that usually do not require medical attention (report to your doctor or health care professional if they continue or are bothersome):  bone pain  muscle pain This list may not describe all possible side effects. Call your doctor for medical advice about side effects. You may report side effects to FDA at 1-800-FDA-1088. Where should I keep my medicine? Keep out of the reach of children. If you are using this medicine at home, you will be instructed on how to store it. Throw away any unused medicine after the expiration date on the label. NOTE: This sheet is a summary. It may not cover all possible information. If you have questions about this medicine, talk to your doctor, pharmacist, or health care provider.  2021 Elsevier/Gold Standard (2019-11-11 13:20:51)  

## 2021-01-12 ENCOUNTER — Inpatient Hospital Stay: Payer: Medicare Other

## 2021-01-14 ENCOUNTER — Telehealth: Payer: Self-pay | Admitting: *Deleted

## 2021-01-14 ENCOUNTER — Telehealth: Payer: Self-pay | Admitting: Cardiovascular Disease

## 2021-01-14 ENCOUNTER — Telehealth: Payer: Self-pay | Admitting: Medical

## 2021-01-14 NOTE — Telephone Encounter (Signed)
Patient returned call (had not listened to his VM)> He reports BP at 2 pm today was 83/64. Instructed him NOT to take any metoprolol and call his cardiologist today about the hypotension. He agrees to do so.

## 2021-01-14 NOTE — Telephone Encounter (Addendum)
   Patient called the after hours line to report low blood pressures. Over the past couple days he has had several BP readings in the 80s/60s. He reports associated weakness but no dizziness, lightheadedness, syncope, or falls. He reduced his metoprolol dose to 50mg  daily 01/13/21 and stopped it today given ongoing hypotension. He reports frequently having soft blood pressures a couple days after receiving his chemotherapy infusion for management of his metastatic colon cancer. BP usually improves with laying down, though no more than 100s/70s. Advised to continue holding metoprolol until further notice. Encouraged fluid and po intake.  May benefit from midodrine. Will route to scheduling to arrange a virtual or in office visit within the next 1-2 days for further evaluation.   Abigail Butts, PA-C 01/14/21; 8:06 PM

## 2021-01-14 NOTE — Telephone Encounter (Signed)
Reports his BP yesterday was 65/50 (did not recheck to confirm) and then few hours later 99/70. At bedtime last night it was 103/71 and today 110/81. During yesterday, he felt weak in his neck and back and today is back to normal. Was on metoprolol 50 mg daily, but on call MD said to hold it for 2 days. He is asking if the chemotherapy has caused this? What are plans for last treatment on 3/24?

## 2021-01-14 NOTE — Telephone Encounter (Addendum)
Per Dr. Benay Spice: Decrease metoprolol to 25 mg daily after his 2 days of holding. This should not interfere with his chemotherapy treatment next week. Left this on his VM.

## 2021-01-14 NOTE — Telephone Encounter (Signed)
Pt c/o BP issue: STAT if pt c/o blurred vision, one-sided weakness or slurred speech  1. What are your last 5 BP readings? Sunday 85/68 , 65/50, Today 110/81, after active 80/67, 83/64  2. Are you having any other symptoms (ex. Dizziness, headache, blurred vision, passed out)? no  3. What is your BP issue? Patient states his BP has been low. He states in the last 2 weeks has fallen. She states he is also pretty, tired and barely able to get around.

## 2021-01-14 NOTE — Telephone Encounter (Signed)
Left message to call back  

## 2021-01-15 ENCOUNTER — Inpatient Hospital Stay: Payer: Medicare Other

## 2021-01-15 ENCOUNTER — Encounter (HOSPITAL_COMMUNITY): Payer: Self-pay | Admitting: *Deleted

## 2021-01-15 ENCOUNTER — Emergency Department (HOSPITAL_COMMUNITY): Payer: Medicare Other

## 2021-01-15 ENCOUNTER — Telehealth: Payer: Self-pay

## 2021-01-15 ENCOUNTER — Inpatient Hospital Stay: Payer: Medicare Other | Admitting: Oncology

## 2021-01-15 ENCOUNTER — Telehealth (INDEPENDENT_AMBULATORY_CARE_PROVIDER_SITE_OTHER): Payer: Medicare Other | Admitting: General Practice

## 2021-01-15 ENCOUNTER — Inpatient Hospital Stay (HOSPITAL_COMMUNITY)
Admission: EM | Admit: 2021-01-15 | Discharge: 2021-01-17 | DRG: 308 | Disposition: A | Discharge status: Home Health | Payer: Medicare Other | Attending: Internal Medicine | Admitting: Internal Medicine

## 2021-01-15 ENCOUNTER — Other Ambulatory Visit: Payer: Self-pay

## 2021-01-15 DIAGNOSIS — C189 Malignant neoplasm of colon, unspecified: Secondary | ICD-10-CM | POA: Diagnosis not present

## 2021-01-15 DIAGNOSIS — I495 Sick sinus syndrome: Secondary | ICD-10-CM | POA: Diagnosis present

## 2021-01-15 DIAGNOSIS — T451X5A Adverse effect of antineoplastic and immunosuppressive drugs, initial encounter: Secondary | ICD-10-CM | POA: Diagnosis present

## 2021-01-15 DIAGNOSIS — E119 Type 2 diabetes mellitus without complications: Secondary | ICD-10-CM | POA: Diagnosis present

## 2021-01-15 DIAGNOSIS — D638 Anemia in other chronic diseases classified elsewhere: Secondary | ICD-10-CM | POA: Diagnosis present

## 2021-01-15 DIAGNOSIS — Z20822 Contact with and (suspected) exposure to covid-19: Secondary | ICD-10-CM | POA: Diagnosis not present

## 2021-01-15 DIAGNOSIS — Z9049 Acquired absence of other specified parts of digestive tract: Secondary | ICD-10-CM

## 2021-01-15 DIAGNOSIS — Z8042 Family history of malignant neoplasm of prostate: Secondary | ICD-10-CM

## 2021-01-15 DIAGNOSIS — N401 Enlarged prostate with lower urinary tract symptoms: Secondary | ICD-10-CM | POA: Diagnosis present

## 2021-01-15 DIAGNOSIS — R350 Frequency of micturition: Secondary | ICD-10-CM | POA: Diagnosis not present

## 2021-01-15 DIAGNOSIS — R600 Localized edema: Secondary | ICD-10-CM | POA: Diagnosis not present

## 2021-01-15 DIAGNOSIS — I4891 Unspecified atrial fibrillation: Secondary | ICD-10-CM | POA: Diagnosis not present

## 2021-01-15 DIAGNOSIS — Z87891 Personal history of nicotine dependence: Secondary | ICD-10-CM

## 2021-01-15 DIAGNOSIS — C787 Secondary malignant neoplasm of liver and intrahepatic bile duct: Secondary | ICD-10-CM | POA: Diagnosis present

## 2021-01-15 DIAGNOSIS — F419 Anxiety disorder, unspecified: Secondary | ICD-10-CM | POA: Diagnosis present

## 2021-01-15 DIAGNOSIS — Z8673 Personal history of transient ischemic attack (TIA), and cerebral infarction without residual deficits: Secondary | ICD-10-CM

## 2021-01-15 DIAGNOSIS — D6481 Anemia due to antineoplastic chemotherapy: Secondary | ICD-10-CM | POA: Diagnosis present

## 2021-01-15 DIAGNOSIS — Z7901 Long term (current) use of anticoagulants: Secondary | ICD-10-CM

## 2021-01-15 DIAGNOSIS — I4821 Permanent atrial fibrillation: Principal | ICD-10-CM | POA: Diagnosis present

## 2021-01-15 DIAGNOSIS — E039 Hypothyroidism, unspecified: Secondary | ICD-10-CM | POA: Diagnosis present

## 2021-01-15 DIAGNOSIS — J449 Chronic obstructive pulmonary disease, unspecified: Secondary | ICD-10-CM | POA: Diagnosis not present

## 2021-01-15 DIAGNOSIS — Z66 Do not resuscitate: Secondary | ICD-10-CM | POA: Diagnosis not present

## 2021-01-15 DIAGNOSIS — I9589 Other hypotension: Secondary | ICD-10-CM | POA: Diagnosis present

## 2021-01-15 DIAGNOSIS — Z79890 Hormone replacement therapy (postmenopausal): Secondary | ICD-10-CM

## 2021-01-15 DIAGNOSIS — I110 Hypertensive heart disease with heart failure: Secondary | ICD-10-CM | POA: Diagnosis present

## 2021-01-15 DIAGNOSIS — Z79899 Other long term (current) drug therapy: Secondary | ICD-10-CM

## 2021-01-15 DIAGNOSIS — H409 Unspecified glaucoma: Secondary | ICD-10-CM | POA: Diagnosis present

## 2021-01-15 DIAGNOSIS — D696 Thrombocytopenia, unspecified: Secondary | ICD-10-CM

## 2021-01-15 DIAGNOSIS — R609 Edema, unspecified: Secondary | ICD-10-CM | POA: Diagnosis present

## 2021-01-15 DIAGNOSIS — I509 Heart failure, unspecified: Secondary | ICD-10-CM

## 2021-01-15 DIAGNOSIS — D6959 Other secondary thrombocytopenia: Secondary | ICD-10-CM | POA: Diagnosis present

## 2021-01-15 DIAGNOSIS — E876 Hypokalemia: Secondary | ICD-10-CM | POA: Diagnosis present

## 2021-01-15 DIAGNOSIS — D72828 Other elevated white blood cell count: Secondary | ICD-10-CM | POA: Diagnosis present

## 2021-01-15 DIAGNOSIS — I5043 Acute on chronic combined systolic (congestive) and diastolic (congestive) heart failure: Secondary | ICD-10-CM | POA: Diagnosis not present

## 2021-01-15 DIAGNOSIS — Z9221 Personal history of antineoplastic chemotherapy: Secondary | ICD-10-CM

## 2021-01-15 DIAGNOSIS — R079 Chest pain, unspecified: Secondary | ICD-10-CM | POA: Diagnosis not present

## 2021-01-15 DIAGNOSIS — D630 Anemia in neoplastic disease: Secondary | ICD-10-CM | POA: Diagnosis present

## 2021-01-15 HISTORY — DX: Heart failure, unspecified: I50.9

## 2021-01-15 LAB — BASIC METABOLIC PANEL
Anion gap: 11 (ref 5–15)
BUN: 23 mg/dL (ref 8–23)
CO2: 20 mmol/L — ABNORMAL LOW (ref 22–32)
Calcium: 8.4 mg/dL — ABNORMAL LOW (ref 8.9–10.3)
Chloride: 106 mmol/L (ref 98–111)
Creatinine, Ser: 0.86 mg/dL (ref 0.61–1.24)
GFR, Estimated: >60 mL/min (ref >60)
Glucose, Bld: 99 mg/dL (ref 70–99)
Potassium: 3.4 mmol/L — ABNORMAL LOW (ref 3.5–5.1)
Sodium: 137 mmol/L (ref 135–145)

## 2021-01-15 LAB — TROPONIN I (HIGH SENSITIVITY)
Troponin I (High Sensitivity): 14 ng/L (ref <18)
Troponin I (High Sensitivity): 14 ng/L (ref <18)

## 2021-01-15 LAB — CBC
HCT: 33.3 % — ABNORMAL LOW (ref 39.0–52.0)
Hemoglobin: 10.4 g/dL — ABNORMAL LOW (ref 13.0–17.0)
MCH: 35.1 pg — ABNORMAL HIGH (ref 26.0–34.0)
MCHC: 31.2 g/dL (ref 30.0–36.0)
MCV: 112.5 fL — ABNORMAL HIGH (ref 80.0–100.0)
Platelets: 32 10*3/uL — ABNORMAL LOW (ref 150–400)
RBC: 2.96 MIL/uL — ABNORMAL LOW (ref 4.22–5.81)
RDW: 19 % — ABNORMAL HIGH (ref 11.5–15.5)
WBC: 11.8 10*3/uL — ABNORMAL HIGH (ref 4.0–10.5)
nRBC: 0.2 % (ref 0.0–0.2)

## 2021-01-15 LAB — RESP PANEL BY RT-PCR (FLU A&B, COVID) ARPGX2
Influenza A by PCR: NEGATIVE
Influenza B by PCR: NEGATIVE
SARS Coronavirus 2 by RT PCR: NEGATIVE

## 2021-01-15 LAB — BRAIN NATRIURETIC PEPTIDE: B Natriuretic Peptide: 2845 pg/mL — ABNORMAL HIGH (ref 0.0–100.0)

## 2021-01-15 MED ORDER — ACETAMINOPHEN 325 MG PO TABS: 650.0000 mg | ORAL_TABLET | Freq: Four times a day (QID) | ORAL | Status: DC | PRN

## 2021-01-15 MED ORDER — POLYETHYLENE GLYCOL 3350 17 G PO PACK: 17.0000 g | PACK | Freq: Every day | ORAL | Status: DC | PRN

## 2021-01-15 MED ORDER — TAMSULOSIN HCL 0.4 MG PO CAPS
0.4000 mg | ORAL_CAPSULE | Freq: Every day | ORAL | Status: DC
  Administered 2021-01-15 – 2021-01-16 (×2): 0.4 mg via ORAL
  Filled 2021-01-15 (×2): qty 1

## 2021-01-15 MED ORDER — APIXABAN 2.5 MG PO TABS
2.5000 mg | ORAL_TABLET | Freq: Two times a day (BID) | ORAL | Status: DC
  Administered 2021-01-15: 2.5 mg via ORAL
  Filled 2021-01-15 (×2): qty 1

## 2021-01-15 MED ORDER — ONDANSETRON HCL 4 MG/2ML IJ SOLN: 4.0000 mg | Freq: Four times a day (QID) | INTRAMUSCULAR | Status: DC | PRN

## 2021-01-15 MED ORDER — METOPROLOL TARTRATE 5 MG/5ML IV SOLN
5.0000 mg | Freq: Once | INTRAVENOUS | Status: AC
  Administered 2021-01-15: 5 mg via INTRAVENOUS
  Filled 2021-01-15: qty 5

## 2021-01-15 MED ORDER — OXYCODONE HCL 5 MG PO TABS: 5.0000 mg | ORAL_TABLET | ORAL | Status: DC | PRN

## 2021-01-15 MED ORDER — TIMOLOL MALEATE 0.5 % OP SOLN
1.0000 [drp] | Freq: Two times a day (BID) | OPHTHALMIC | Status: DC
  Administered 2021-01-15 – 2021-01-17 (×4): 1 [drp] via OPHTHALMIC
  Filled 2021-01-15: qty 5

## 2021-01-15 MED ORDER — FUROSEMIDE 10 MG/ML IJ SOLN
20.0000 mg | Freq: Once | INTRAMUSCULAR | Status: AC
  Administered 2021-01-15: 20 mg via INTRAVENOUS
  Filled 2021-01-15: qty 2

## 2021-01-15 MED ORDER — ACETAMINOPHEN 650 MG RE SUPP: 650.0000 mg | Freq: Four times a day (QID) | RECTAL | Status: DC | PRN

## 2021-01-15 MED ORDER — BRIMONIDINE TARTRATE 0.2 % OP SOLN
1.0000 [drp] | Freq: Two times a day (BID) | OPHTHALMIC | Status: DC
  Administered 2021-01-15 – 2021-01-17 (×4): 1 [drp] via OPHTHALMIC
  Filled 2021-01-15: qty 5

## 2021-01-15 MED ORDER — METOPROLOL TARTRATE 25 MG PO TABS
25.0000 mg | ORAL_TABLET | Freq: Once | ORAL | Status: AC
  Administered 2021-01-15: 25 mg via ORAL
  Filled 2021-01-15: qty 1

## 2021-01-15 MED ORDER — METOPROLOL TARTRATE 5 MG/5ML IV SOLN: 5.0000 mg | Freq: Four times a day (QID) | INTRAVENOUS | Status: DC | PRN

## 2021-01-15 MED ORDER — BRIMONIDINE TARTRATE-TIMOLOL 0.2-0.5 % OP SOLN: 1.0000 [drp] | Freq: Two times a day (BID) | OPHTHALMIC | Status: DC

## 2021-01-15 MED ORDER — ONDANSETRON HCL 4 MG PO TABS: 4.0000 mg | ORAL_TABLET | Freq: Four times a day (QID) | ORAL | Status: DC | PRN

## 2021-01-15 MED ORDER — LORAZEPAM 0.5 MG PO TABS: 0.5000 mg | ORAL_TABLET | Freq: Every evening | ORAL | Status: DC | PRN

## 2021-01-15 MED ORDER — MORPHINE SULFATE (PF) 2 MG/ML IV SOLN
2.0000 mg | INTRAVENOUS | Status: DC | PRN
Start: 2021-01-15 — End: 2021-01-17

## 2021-01-15 MED ORDER — POTASSIUM CHLORIDE 20 MEQ PO PACK
20.0000 meq | PACK | Freq: Once | ORAL | Status: AC
  Administered 2021-01-15: 20 meq via ORAL
  Filled 2021-01-15: qty 1

## 2021-01-15 MED ORDER — FINASTERIDE 5 MG PO TABS
5.0000 mg | ORAL_TABLET | Freq: Every day | ORAL | Status: DC
  Administered 2021-01-15 – 2021-01-17 (×3): 5 mg via ORAL
  Filled 2021-01-15 (×3): qty 1

## 2021-01-15 MED ORDER — PROCHLORPERAZINE MALEATE 5 MG PO TABS: 5.0000 mg | ORAL_TABLET | Freq: Four times a day (QID) | ORAL | Status: DC | PRN

## 2021-01-15 MED ORDER — FUROSEMIDE 10 MG/ML IJ SOLN
40.0000 mg | Freq: Every day | INTRAMUSCULAR | Status: DC
  Administered 2021-01-16: 40 mg via INTRAVENOUS
  Filled 2021-01-15: qty 4

## 2021-01-15 MED ORDER — LEVOTHYROXINE SODIUM 75 MCG PO TABS
75.0000 ug | ORAL_TABLET | Freq: Every day | ORAL | Status: DC
  Administered 2021-01-16 – 2021-01-17 (×2): 75 ug via ORAL
  Filled 2021-01-15 (×2): qty 1

## 2021-01-15 NOTE — Progress Notes (Unsigned)
Error

## 2021-01-15 NOTE — ED Triage Notes (Signed)
C/o fast heart rate

## 2021-01-15 NOTE — Telephone Encounter (Signed)
Called pt for a VV today at 13:30pm went over medications, pt is taking all medication listed except Metoprolol. His Oncologist has weaned him off this medication and he has not taken Metoprolol for 2 days, per pt. Today pt's BP 123/97 HR 129-Left UE.  Also, BP on the Right UE 120/106 HR 151. Pt denies any Chest pain. He states that he is experiencing AFIB/flutter on exertion, intermittantly at rest and when he lies down to go to bed at night, he can feel AFIB/Flutter until he falls asleep. D/W Coletta Memos, FNP-C upon review of vital signs and chart review he states to tell pt to go directly to the [place]. LMVM for [place], PA-C. Also d/w cardmaster.

## 2021-01-15 NOTE — Telephone Encounter (Signed)
I spoke with the opt and he spoke with Roby Lofts PA last night due to fluctuating BP... but has been dropping in the 14'J systolic.. pt says he has been having this problem since starting [employer] 11/2020 but it has been worsening...Dr. Benay Spice stopped his Metoprolol but hs is still having it and was told to call cardio.. per Daleen Snook pt to have a virtual or in person asap.. I made the pt a televisit with Coletta Memos NP today at 1:45 pm.  Pt to have fresh vitals for the call and to have all of his meds ready for review.   Pt also reports that he has not been eating or drinking well and he says he will try to make nutritional improvements.  I connected with  Angelica Ran on 01/15/21 by a video enabled telemedicine application and verified that I am speaking with the correct person using two identifiers.   I discussed the limitations of evaluation and management by telemedicine. The patient expressed understanding and agreed to proceed.

## 2021-01-15 NOTE — ED Provider Notes (Signed)
Emergency Department Provider Note   I have reviewed the triage vital signs and the nursing notes.   HISTORY  Chief Complaint Chest Pain   HPI Dylan York is a 87 y.o. male with past medical history reviewed below including metastatic colon cancer currently on chemotherapy as well as atrial fibrillation on Eliquis presents to the emergency department with elevated heart rate.  Patient states that he was having a follow-up televisit with his oncologist today when his heart rate was found to be in the 150s and he was immediately redirected to the emergency department.  Patient denies chest pain, shortness of breath, near syncope symptoms.  He states that he can feel his heart fluttering at times.  Patient states that he was having some lower heart rates at home and his metoprolol dose was cut to once per day as opposed to twice per day.  He tells me is not been on any metoprolol for the past 2 days.  He was also taken off of his amiodarone with concern for possible liver issue with new mets found in the liver area.  Patient tells me that he takes his Eliquis daily but not infrequently misses dosing.  He is supposed to take it twice per day and states he at least takes it once per day but due to him being his wife's primary caregiver often forgets. No radiation of symptoms or modifying factors.    Past Medical History:  Diagnosis Date  . Atrial fibrillation ([place])   . Atrial tachycardia ([place])   . Biatrial enlargement    severe biatrial enlargement  . BPH (benign prostatic hyperplasia)   . Colon cancer ([place])   . Diabetes mellitus without complication ([place])   . Glaucoma   . Graded compression stocking in place   . Hypertension   . Hypothyroidism   . Neutropenia ([place])   . Rosacea   . Sinus bradycardia, with HR in the 40s BB stopped. 08/08/2013  . SSS (sick sinus syndrome) ([place])   . Stroke [facility])    no residual  . Thyroid disease   . Tricuspid regurgitation     Patient Active  Problem List   Diagnosis Date Noted  . Goals of care, counseling/discussion 10/06/2020  . Metastatic colon cancer to liver ([place]) 10/06/2020  . Left inguinal hernia 08/28/2020  . IDA (iron deficiency anemia) 04/15/2020  . DM (diabetes mellitus) ([place]) 04/14/2020  . Hyperglycemia 04/14/2020  . Adenocarcinoma of transverse colon s/p robotic colectomy 04/13/2020 04/14/2020  . S/P laparoscopic colectomy 04/13/2020  . Dysphagia 11/08/2019  . Anemia, unspecified 11/08/2019  . Bilateral hand numbness 05/12/2017  . Hyperlipidemia 10/02/2016  . Essential hypertension 10/02/2016  . Chronic diastolic heart failure ([place]) 09/11/2016  . Right homonymous hemianopsia 09/07/2016  . Cerebrovascular accident (CVA) due to embolism of left middle cerebral artery ([place]) 09/07/2016  . Chronic prostatitis 07/02/2015  . Hypothyroidism 09/26/2014  . Lower extremity edema 08/23/2013  . PAF (paroxysmal atrial fibrillation) ([place]) 08/08/2013  . Sinus bradycardia, with HR in the 40s BB stopped. 08/08/2013  . Labile hypertension 08/07/2013  . Hypertensive crisis 08/05/2013  . Anticoagulated 05/05/2013  . AF (atrial fibrillation) ([place]) 04/04/2013  . Hypertensive heart disease 04/04/2013  . Benign prostatic hyperplasia with lower urinary tract symptoms 04/04/2013  . GERD (gastroesophageal reflux disease) 04/04/2013  . Sick sinus syndrome ([place]) 04/04/2013  . Renal cyst 11/03/2011    Past Surgical History:  Procedure Laterality Date  . APPENDECTOMY    . BACTERIAL OVERGROWTH TEST N/A 01/14/2016  Procedure: BACTERIAL OVERGROWTH TEST;  Surgeon: Rogene Houston, MD;  Location: [facility] ENDO [facility];  Service: Endoscopy;  Laterality: N/A;  730  . BIOPSY  02/09/2020   Procedure: BIOPSY;  Surgeon: Rogene Houston, MD;  Location: [facility] END[facility] SU[facility];  Service: Endoscopy;;  . COLONOSCOPY N/A 02/09/2020   Procedure: COLONOSCOPY;  Surgeon: Rogene Houston, MD;  Location: [facility];  Service: Endoscopy;  Laterality: N/A;  1225  . HERNIA  REPAIR    . INGUINAL HERNIA REPAIR Right   . IR IMAGING GUIDED PORT INSERTION  10/08/2020  . POLYPECTOMY  02/09/2020   Procedure: POLYPECTOMY;  Surgeon: Rogene Houston, MD;  Location: [facility];  Service: Endoscopy;;  . TRANSURETHRAL RESECTION OF PROSTATE    . UMBILICAL HERNIA REPAIR      Allergies Amiodarone and Multaq [dronedarone]  Family History  Problem Relation Age of Onset  . Cancer Mother   . Heart Problems Father        atherosclerosis  . Cancer - Prostate Paternal Grandfather     Social History Social History   Tobacco Use  . Smoking status: Former Smoker    Quit date: 08/1977    Years since quitting: 43.4  . Smokeless tobacco: Never Used  Vaping Use  . Vaping Use: Never used  Substance Use Topics  . Alcohol use: No    Alcohol/week: 0.0 standard drinks  . Drug use: No    Review of Systems  Constitutional: No fever/chills Eyes: No visual changes. ENT: No sore throat. Cardiovascular: Denies chest pain. Positive palpitations.  Respiratory: Denies shortness of breath. Gastrointestinal: No abdominal pain.  No nausea, no vomiting.  No diarrhea.  No constipation. Genitourinary: Negative for dysuria. Musculoskeletal: Negative for back pain. Skin: Negative for rash. Neurological: Negative for headaches, focal weakness or numbness.  10-point ROS otherwise negative.  ____________________________________________   PHYSICAL EXAM:  VITAL SIGNS: ED Triage Vitals  Enc Vitals Group     BP 01/15/21 1436 (!) 123/99     Pulse Rate 01/15/21 1436 (!) 56     Resp 01/15/21 1436 18     Temp 01/15/21 1436 (!) 97.5 F (36.4 C)     Temp Source 01/15/21 1436 Oral     SpO2 01/15/21 1436 98 %   Constitutional: Alert and oriented. Well appearing and in no acute distress. Eyes: Conjunctivae are normal.  Head: Atraumatic. Nose: No congestion/rhinnorhea. Mouth/Throat: Mucous membranes are moist.  Neck: No stridor.  Cardiovascular: Irregularly irregular rhythm. Good  peripheral circulation. Grossly normal heart sounds.   Respiratory: Normal respiratory effort.  No retractions. Lungs CTAB. Gastrointestinal: Soft and nontender. No distention.  Musculoskeletal: No lower extremity tenderness with 3+ pitting edema bilaterally. No gross deformities of extremities. Neurologic:  Normal speech and language. No gross focal neurologic deficits are appreciated.  Skin:  Skin is warm, dry and intact. No rash noted.  ____________________________________________   LABS (all labs ordered are listed, but only abnormal results are displayed)  Labs Reviewed  BASIC METABOLIC PANEL - Abnormal; Notable for the following components:      Result Value   Potassium 3.4 (*)    CO2 20 (*)    Calcium 8.4 (*)    All other components within normal limits  CBC - Abnormal; Notable for the following components:   WBC 11.8 (*)    RBC 2.96 (*)    Hemoglobin 10.4 (*)    HCT 33.3 (*)    MCV 112.5 (*)    MCH 35.1 (*)  RDW 19.0 (*)    Platelets 32 (*)    All other components within normal limits  BRAIN NATRIURETIC PEPTIDE - Abnormal; Notable for the following components:   B Natriuretic Peptide 2,845.0 (*)    All other components within normal limits  RESP PANEL BY RT-PCR (FLU A&B, COVID) ARPGX2  TROPONIN I (HIGH SENSITIVITY)  TROPONIN I (HIGH SENSITIVITY)   ____________________________________________  EKG   EKG Interpretation  Date/Time:  Tuesday January 15 2021 14:39:01 EDT Ventricular Rate:  139 PR Interval:    QRS Duration: 90 QT Interval:  268 QTC Calculation: 407 R Axis:   103 Text Interpretation: Atrial fibrillation with rapid ventricular response Rightward axis Possible Anterior infarct , age undetermined ST changes likely rate related. Confirmed by Nanda Quinton [phone]) on 01/15/2021 3:08:19 PM       ____________________________________________  RADIOLOGY  DG Chest 2 View  Result Date: 01/15/2021 CLINICAL DATA:  Chest pain EXAM: CHEST - 2 VIEW  COMPARISON:  CT 03/06/2020 FINDINGS: Right Port-A-Cath in place with the tip at the cavoatrial junction. Heart is normal size. There is hyperinflation of the lungs compatible with COPD. Multiple nodular densities project over both lungs, the largest the 3 cm rounded area projecting over the left mid lung peripherally. No effusions. No acute bony abnormality. IMPRESSION: Nodular densities project over both lungs including 3 cm rounded mass in the left mid lung region. Recommend chest CT for further evaluation. COPD. Electronically Signed   By: Rolm Baptise M.D.   On: 01/15/2021 15:08    ____________________________________________   PROCEDURES  Procedure(s) performed:   .Critical Care Performed by: Margette Fast, MD Authorized by: Margette Fast, MD   Critical care provider statement:    Critical care time (minutes):  45   Critical care time was exclusive of:  Separately billable procedures and treating other patients and teaching time   Critical care was necessary to treat or prevent imminent or life-threatening deterioration of the following conditions:  Cardiac failure   Critical care was time spent personally by me on the following activities:  Discussions with consultants, evaluation of patient's response to treatment, examination of patient, ordering and performing treatments and interventions, ordering and review of laboratory studies, ordering and review of radiographic studies, pulse oximetry, re-evaluation of patient's condition, obtaining history from patient or surrogate, review of old charts, blood draw for specimens and development of treatment plan with patient or surrogate   I assumed direction of critical care for this patient from another provider in my specialty: no     Care discussed with: admitting provider       ____________________________________________   INITIAL IMPRESSION / [place] / ED COURSE  Pertinent labs & imaging results that were available  during my care of the patient were reviewed by me and considered in my medical decision making (see chart for details).   Patient presents to the emergency department for evaluation of heart palpitations with heart rate in the 150s.  EKG is consistent with A. fib with RVR.  There is some ST changes likely rate related.  He is not having any chest pain this was documented as his chief complaint.  Plan for IV metoprolol to start.  Patient likely not a good cardioversion candidate in the emergency department as he admits to missing his Eliquis dosing from time to time.  Chest x-ray reviewed showing nodularities but no other acute findings.  He also has pitting edema in the legs which he states is new for  him.   04:50 PM  Patient reassessed and heart rate is much improved in the 90s to low 100s.  Blood pressure is normal in the 476L systolic.  He is no longer feeling the heart palpitation symptoms but does remain in A. fib.  He has a normal troponin on first lab.  His second troponin is pending along with his BNP. Plan for po metoprolol 25 mg here and reassess.   06:30 PM  Went to re-evaluate the patient.  His heart rate remains in the low 100s to 90s.  His repeat troponin is unremarkable.  His BNP is significantly elevated at 2800.  I have ordered some IV Lasix.  Will hold on continuous diltiazem infusion as his heart rate is only mildly elevated and I am unsure if he has developed some new CHF.  I reviewed his echo from 2019.  Plan for diuresis and admit for [place] to titrate his home rate control medications and consider possible repeat echo.   Discussed patient's case with [facility] to request admission. Patient and family (if present) updated with plan. Care transferred to [facility] service.  I reviewed all nursing notes, vitals, pertinent old records, EKGs, labs, imaging (as available).  ____________________________________________  FINAL CLINICAL IMPRESSION(S) / ED DIAGNOSES  Final diagnoses:  Atrial  fibrillation with RVR (HCC)  Peripheral edema     MEDICATIONS GIVEN DURING THIS VISIT:  Medications  furosemide (LASIX) injection 20 mg (has no administration in time range)  metoprolol tartrate (LOPRESSOR) injection 5 mg (5 mg Intravenous Given 01/15/21 1601)  metoprolol tartrate (LOPRESSOR) tablet 25 mg (25 mg Oral Given 01/15/21 1724)    Note:  This document was prepared using Dragon voice recognition software and may include unintentional dictation errors.  Nanda Quinton, MD, [facility] Emergency Medicine    Denese Mentink, [facility], MD 01/15/21 2230

## 2021-01-15 NOTE — H&P (Signed)
TRH H&P    Patient Demographics:    Dylan York, is a 87 y.o. male  MRN: [id number]  DOB - [birth year]  Admit Date - 01/15/2021  Referring MD/NP/PA: Long  Outpatient Primary MD for the patient is Glenda Chroman, MD  Patient coming from: Home  Chief complaint- Elevated HR   HPI:    Dylan York  is a 87 y.o. male, with history of tricuspid regurgitation, thyroid disease, sick sinus syndrome, hypertension, colon cancer with mets to liver, atrial fibrillation, and more presents to the ER with a chief complaint of elevated heart rate.  Patient reports that he had a virtual visit with his cardiologist today.  They had him check his vitals before talking to the doctor, and his heart rate was elevated to 150s, so they told him to come into the ER.  Patient reports that he does feel palpitations occasionally.  He reports most likely his palpitations occur during exertion, and last until he has had some time to rest.  Patient reports that he does not have shortness of breath or chest pain with these palpitations.  Patient has been following with the oncologist for his colon cancer.  He is received 5 out of 6 chemo treatments, and his next treatment is supposed to be 24 March.  He has been discussing his hypotension with his oncologist.  He reports a blood pressure as low as 65/50.  His oncologist has taken him from 50 mg of metoprolol twice daily to 25 mg of metoprolol twice daily, and then take him off of metoprolol altogether.  He reports that his last dose of metoprolol was 2 days ago.  Patient reports that he can tell when his blood pressure is low because he feels generalized weakness, so intense that it feels like his neck cannot hold his head up.  He usually goes to bed when this happens, and then when he gets up a couple hours later his blood pressure is normalized per his report.  He is not been told to drink water when  his blood pressure is low.  Patient reports that he checks his vitals every day.  His heart rate is usually 70s to 90s.  He reports the highest he had seen prior to today was 130.  Patient also reports peripheral edema that started 1 week ago and has been progressively worse.  He reports that one time he was on Lasix, he does not know why he was taken off of it.  Recently he was on spironolactone, reports he was taken off that as well.  He stopped wearing his compression stockings for "unknown reasons."  He has remained active as he is the primary caregiver for his wife.  He notes no blistering or weeping of his legs.  He has no erythema, tenderness to his legs.   Patient does report 2 falls within the last month.  The last fall was 10 days ago.  He reports that he feels palpitations prior to the falls, and then falls backwards.  One time he fell backwards in  the bathtub, different time he fell backwards in the kitchen.  He reports no loss of consciousness, but did require assistance to get up.  Patient reports that he cannot say for sure if he hit his head or not.  Patient does not smoke, does not drink.  He is vaccinated for COVID.  Patient is DNR.  In the ED 97.5, heart rate 56-1 26, respiratory rate 13-18, blood pressure 139/98, maintaining saturations on room air Slight leukocytosis at 11.8, hemoglobin 10.4 Chemistry panel shows a mild hypokalemia at 3.4 BNP is elevated at 2845 without any comparison Troponin is stable at 14 and 14 Respiratory panel is negative Chest x-ray shows nodular densities, COPD, recommends CT follow-up EKG showed A. fib with RVR at 139 Patient was given 20 mg of IV Lasix, metoprolol 5 mg IV, metoprolol 25 mg p.o. Admission was requested to titrate rate control medications and for diuresis    Review of systems:    In addition to the HPI above,  No Fever-chills, No Headache, No changes with Vision or hearing, No problems swallowing food or Liquids, No Chest pain,  or shortness of breath does admit to palpitations No Abdominal pain, No Nausea or Vomiting, bowel movements are regular, No Blood in stool or Urine, No dysuria, No new skin rashes or bruises, No new joints pains-aches,  No new asymmetric weakness, tingling, numbness in any extremity, No polyuria, polydypsia or polyphagia, Does have significant mental stressor of being wife's primary caregiver and taking palliative chemo for colon cancer with metastasis to liver  All other systems reviewed and are negative.    Past History of the following :    Past Medical History:  Diagnosis Date  . Atrial fibrillation ([place])   . Atrial tachycardia ([place])   . Biatrial enlargement    severe biatrial enlargement  . BPH (benign prostatic hyperplasia)   . Colon cancer ([place])   . Diabetes mellitus without complication ([place])   . Glaucoma   . Graded compression stocking in place   . Hypertension   . Hypothyroidism   . Neutropenia ([place])   . Rosacea   . Sinus bradycardia, with HR in the 40s BB stopped. 08/08/2013  . SSS (sick sinus syndrome) ([place])   . Stroke [facility])    no residual  . Thyroid disease   . Tricuspid regurgitation       Past Surgical History:  Procedure Laterality Date  . APPENDECTOMY    . BACTERIAL OVERGROWTH TEST N/A 01/14/2016   Procedure: BACTERIAL OVERGROWTH TEST;  Surgeon: Rogene Houston, MD;  Location[facility];  Service: Endoscopy;  Laterality: N/A;  730  . BIOPSY  02/09/2020   Procedure: BIOPSY;  Surgeon: Rogene Houston, MD;  Location[facility];  Service: Endoscopy;;  . COLONOSCOPY N/A 02/09/2020   Procedure: COLONOSCOPY;  Surgeon: Rogene Houston, MD;  Location: [facility];  Service: Endoscopy;  Laterality: N/A;  1225  . HERNIA REPAIR    . INGUINAL HERNIA REPAIR Right   . IR IMAGING GUIDED PORT INSERTION  10/08/2020  . POLYPECTOMY  02/09/2020   Procedure: POLYPECTOMY;  Surgeon: Rogene Houston, MD;  Location[facility];  Service: Endoscopy;;  . TRANSURETHRAL  RESECTION OF PROSTATE    . UMBILICAL HERNIA REPAIR        Social History:      Social History   Tobacco Use  . Smoking status: Former Smoker    Quit date: 08/1977    Years since quitting: 43.4  .  Smokeless tobacco: Never Used  Substance Use Topics  . Alcohol use: No    Alcohol/week: 0.0 standard drinks       Family History :     Family History  Problem Relation Age of Onset  . Cancer Mother   . Heart Problems Father        atherosclerosis  . Cancer - Prostate Paternal Grandfather       Home Medications:   Prior to Admission medications   Medication Sig Start Date End Date Taking? Authorizing Provider  apixaban (ELIQUIS) 2.5 MG TABS tablet Take 1 tablet (2.5 mg total) by mouth 2 (two) times daily. 01/09/21  Yes Ladell Pier, MD  COMBIGAN 0.2-0.5 % ophthalmic solution Place 1 drop into both eyes 2 (two) times daily. 05/13/14  Yes [provider]  finasteride (PROSCAR) 5 MG tablet Take 5 mg by mouth daily.   Yes [provider]  levothyroxine (SYNTHROID, LEVOTHROID) 75 MCG tablet Take 75 mcg by mouth daily before breakfast.  08/06/18  Yes [provider]  lidocaine-prilocaine (EMLA) cream Apply to port site 1-2 hours prior to use 09/21/20  Yes Owens Shark, NP  LORazepam (ATIVAN) 1 MG tablet Take 0.5 mg by mouth at bedtime as needed for sleep.  01/18/16  Yes [provider]  prochlorperazine (COMPAZINE) 5 MG tablet Take 1 tablet (5 mg total) by mouth every 6 (six) hours as needed for nausea or vomiting. 09/21/20  Yes Owens Shark, NP  tamsulosin (FLOMAX) 0.4 MG CAPS capsule Take 1 capsule (0.4 mg total) by mouth at bedtime. 06/13/20  Yes Troy Sine, MD  Vitamin D, Cholecalciferol, 50 MCG (2000 UT) CAPS Take 2,000 Units by mouth daily.    Yes [provider]  docusate sodium (COLACE) 100 MG capsule Take 2 capsules (200 mg total) by mouth at bedtime. Patient not taking: No sig reported 04/13/18   Rogene Houston, MD   spironolactone (ALDACTONE) 25 MG tablet  01/15/21   [provider]     Allergies:     Allergies  Allergen Reactions  . Amiodarone Other (See Comments)    Not good for liver  . Multaq [Dronedarone] Shortness Of Breath     Physical Exam:   Vitals  Blood pressure (!) 132/100, pulse (!) 118, temperature 97.8 F (36.6 C), temperature source Oral, resp. rate 18, height 5\' 8"  (1.727 m), weight 59.6 kg, SpO2 99 %.  1.  General: Patient sitting up on side of bed eating dinner, no acute distress  2. Psychiatric: Very pleasant, very cooperative with exam, alert and oriented x3  3. Neurologic: Speech and language are normal, face is symmetric, moves all 4 extremities voluntarily, no acute deficit on limited exam  4. HEENMT:  Head is atraumatic, normocephalic, pupils are reactive to light, neck is cachectic, mucous membranes are moist, trachea is midline  5. Respiratory : Lungs are clear to auscultation bilaterally without wheezes, rhonchi, crackles, no increased work of breathing, no use of accessory muscles  6. Cardiovascular : Heart rate is normal, rhythm is irregularly irregular, no murmurs rubs or gallops, peripheral edema present  7. Gastrointestinal:  Abdomen is soft, nondistended, nontender to palpation, bowel sounds present  8. Skin:  Skin is warm dry and intact, early venous stasis changes on the medial aspect of the right lower extremity, no acute lesion on limited exam  9.Musculoskeletal:  No acute deformity, no calf tenderness, peripheral pulses palpated    Data Review:    CBC Recent Labs  Lab 01/09/21 0915 01/15/21 1520  WBC 4.4 11.8*  HGB 10.1* 10.4*  HCT 31.6* 33.3*  PLT 71* 32*  MCV 106.4* 112.5*  MCH 34.0 35.1*  MCHC 32.0 31.2  RDW 19.9* 19.0*  LYMPHSABS 0.7  --   MONOABS 0.6  --   EOSABS 0.0  --   BASOSABS 0.1  --     ------------------------------------------------------------------------------------------------------------------  Results for orders placed or performed during the hospital encounter of 01/15/21 (from the past 48 hour(s))  Basic metabolic panel     Status: Abnormal   Collection Time: 01/15/21  3:20 PM  Result Value Ref Range   Sodium 137 135 - 145 mmol/L   Potassium 3.4 (L) 3.5 - 5.1 mmol/L   Chloride 106 98 - 111 mmol/L   CO2 20 (L) 22 - 32 mmol/L   Glucose, Bld 99 70 - 99 mg/dL    Comment: Glucose reference range applies only to samples taken after fasting for at least 8 hours.   BUN 23 8 - 23 mg/dL   Creatinine, Ser 0.86 0.61 - 1.24 mg/dL   Calcium 8.4 (L) 8.9 - 10.3 mg/dL   GFR, Estimated >60 >60 mL/min    Comment: (NOTE) Calculated using the CKD-EPI Creatinine Equation (2021)    Anion gap 11 5 - 15    Comment: Performed at [facility], [address], [place], [place] [address]  CBC     Status: Abnormal   Collection Time: 01/15/21  3:20 PM  Result Value Ref Range   WBC 11.8 (H) 4.0 - 10.5 K/uL   RBC 2.96 (L) 4.22 - 5.81 MIL/uL   Hemoglobin 10.4 (L) 13.0 - 17.0 g/dL   HCT 33.3 (L) 39.0 - 52.0 %   MCV 112.5 (H) 80.0 - 100.0 fL   MCH 35.1 (H) 26.0 - 34.0 pg   MCHC 31.2 30.0 - 36.0 g/dL   RDW 19.0 (H) 11.5 - 15.5 %   Platelets 32 (L) 150 - 400 K/uL    Comment: SPECIMEN CHECKED FOR CLOTS Immature Platelet Fraction may be clinically indicated, consider ordering this additional test [id number] PLATELET COUNT CONFIRMED BY SMEAR    nRBC 0.2 0.0 - 0.2 %    Comment: Performed at [facility], [address], [place], [place] [address]  Troponin I (High Sensitivity)     Status: None   Collection Time: 01/15/21  3:20 PM  Result Value Ref Range   Troponin I (High Sensitivity) 14 <18 ng/L    Comment: (NOTE) Elevated high sensitivity troponin I (hsTnI) values and significant  changes across serial measurements may suggest ACS but many other  chronic and acute conditions are  known to elevate hsTnI results.  Refer to the "Links" section for chest pain algorithms and additional  guidance. Performed at [facility], [address] [place], [place] [address]   Brain natriuretic peptide     Status: Abnormal   Collection Time: 01/15/21  3:20 PM  Result Value Ref Range   B Natriuretic Peptide 2,845.0 (H) 0.0 - 100.0 pg/mL    Comment: Performed at [facility], [address], [place], [place] [address]  Resp Panel by RT-PCR (Flu A&B, Covid) Nasopharyngeal Swab     Status: None   Collection Time: 01/15/21  3:48 PM   Specimen: Nasopharyngeal Swab; Nasopharyngeal(NP) swabs in vial transport medium  Result Value Ref Range   SARS Coronavirus 2 by RT PCR NEGATIVE NEGATIVE    Comment: (NOTE) SARS-CoV-2 target nucleic acids are NOT DETECTED.  The SARS-CoV-2 RNA is  generally detectable in upper respiratory specimens during the acute phase of infection. The lowest concentration of SARS-CoV-2 viral copies this assay can detect is 138 copies/mL. A negative result does not preclude SARS-Cov-2 infection and should not be used as the sole basis for treatment or other patient management decisions. A negative result may occur with  improper specimen collection/handling, submission of specimen other than nasopharyngeal swab, presence of viral mutation(s) within the areas targeted by this assay, and inadequate number of viral copies(<138 copies/mL). A negative result must be combined with clinical observations, patient history, and epidemiological information. The expected result is Negative.  Fact Sheet for Patients:  EntrepreneurPulse.com.au  Fact Sheet for Healthcare Providers:  IncredibleEmployment.be  This test is no t yet approved or cleared by the [place] FDA and  has been authorized for detection and/or diagnosis of SARS-CoV-2 by FDA under an Emergency Use Authorization (EUA). This EUA will remain  in effect (meaning this  test can be used) for the duration of the COVID-19 declaration under Section 564(b)(1) of the Act, 21 U.S.C.section 360bbb-3(b)(1), unless the authorization is terminated  or revoked sooner.       Influenza A by PCR NEGATIVE NEGATIVE   Influenza B by PCR NEGATIVE NEGATIVE    Comment: (NOTE) The Xpert Xpress SARS-CoV-2/FLU/RSV plus assay is intended as an aid in the diagnosis of influenza from Nasopharyngeal swab specimens and should not be used as a sole basis for treatment. Nasal washings and aspirates are unacceptable for Xpert Xpress SARS-CoV-2/FLU/RSV testing.  Fact Sheet for Patients: EntrepreneurPulse.com.au  Fact Sheet for Healthcare Providers: IncredibleEmployment.be  This test is not yet approved or cleared by the [place] FDA and has been authorized for detection and/or diagnosis of SARS-CoV-2 by FDA under an Emergency Use Authorization (EUA). This EUA will remain in effect (meaning this test can be used) for the duration of the COVID-19 declaration under Section 564(b)(1) of the Act, 21 U.S.C. section 360bbb-3(b)(1), unless the authorization is terminated or revoked.  Performed at [facility], [address], [place], [place] [address]   Troponin I (High Sensitivity)     Status: None   Collection Time: 01/15/21  5:37 PM  Result Value Ref Range   Troponin I (High Sensitivity) 14 <18 ng/L    Comment: (NOTE) Elevated high sensitivity troponin I (hsTnI) values and significant  changes across serial measurements may suggest ACS but many other  chronic and acute conditions are known to elevate hsTnI results.  Refer to the "Links" section for chest pain algorithms and additional  guidance. Performed at [facility], [address] [place], [place] [address]     Chemistries  Recent Labs  Lab 01/09/21 0915 01/15/21 1520  NA 141 137  K 4.2 3.4*  CL 111 106  CO2 22 20*  GLUCOSE 88 99  BUN 18 23  CREATININE 0.83 0.86   CALCIUM 8.4* 8.4*  AST 43*  --   ALT 16  --   ALKPHOS 104  --   BILITOT 1.9*  --    ------------------------------------------------------------------------------------------------------------------  ------------------------------------------------------------------------------------------------------------------ GFR: Estimated Creatinine Clearance: 46.2 mL/min (by C-G formula based on SCr of 0.86 mg/dL). Liver Function Tests: Recent Labs  Lab 01/09/21 0915  AST 43*  ALT 16  ALKPHOS 104  BILITOT 1.9*  PROT 5.5*  ALBUMIN 2.9*   No results for input(s): LIPASE, AMYLASE in the last 168 hours. No results for input(s): AMMONIA in the last 168 hours. Coagulation Profile: No results for input(s): INR, PROTIME in the last 168  hours. Cardiac Enzymes: No results for input(s): CKTOTAL, CKMB, CKMBINDEX, TROPONINI in the last 168 hours. BNP (last 3 results) No results for input(s): PROBNP in the last 8760 hours. HbA1C: No results for input(s): HGBA1C in the last 72 hours. CBG: No results for input(s): GLUCAP in the last 168 hours. Lipid Profile: No results for input(s): CHOL, HDL, LDLCALC, TRIG, CHOLHDL, LDLDIRECT in the last 72 hours. Thyroid Function Tests: No results for input(s): TSH, T4TOTAL, FREET4, T3FREE, THYROIDAB in the last 72 hours. Anemia Panel: No results for input(s): VITAMINB12, FOLATE, FERRITIN, TIBC, IRON, RETICCTPCT in the last 72 hours.  --------------------------------------------------------------------------------------------------------------- Urine analysis:    Component Value Date/Time   COLORURINE YELLOW 09/07/2016 [place] 09/07/2016 1143   LABSPEC 1.010 09/07/2016 1143   PHURINE 6.0 09/07/2016 1143   GLUCOSEU NEGATIVE 09/07/2016 1143   HGBUR NEGATIVE 09/07/2016 1143   BILIRUBINUR NEGATIVE 09/07/2016 1143   KETONESUR NEGATIVE 09/07/2016 1143   PROTEINUR NEGATIVE 09/07/2016 1143   NITRITE NEGATIVE 09/07/2016 1143   LEUKOCYTESUR  NEGATIVE 09/07/2016 1143      Imaging Results:    DG Chest 2 View  Result Date: 01/15/2021 CLINICAL DATA:  Chest pain EXAM: CHEST - 2 VIEW COMPARISON:  CT 03/06/2020 FINDINGS: Right Port-A-Cath in place with the tip at the cavoatrial junction. Heart is normal size. There is hyperinflation of the lungs compatible with COPD. Multiple nodular densities project over both lungs, the largest the 3 cm rounded area projecting over the left mid lung peripherally. No effusions. No acute bony abnormality. IMPRESSION: Nodular densities project over both lungs including 3 cm rounded mass in the left mid lung region. Recommend chest CT for further evaluation. COPD. Electronically Signed   By: Rolm Baptise M.D.   On: 01/15/2021 15:08    My personal review of EKG: Rhythm A. fib with RVR, Rate139 /min   Assessment & Plan:    Principal Problem:   Atrial fibrillation with RVR (HCC) Active Problems:   Hypothyroidism   Peripheral edema   Hypokalemia   1. A. fib with RVR 1. Heart rate of 151 at home documented by the nurse that saw him during virtual visit 2. Recently taken off of metoprolol 3. 5 mg metoprolol given in ED IV as well as 25 mg p.o. 4. Patient has had episodes of hypotension at home but blood pressure is stable now 5. Check TSH 6. Consult cardiology for recommendations -patient has had hypotension with the metoprolol, he has been taken off of amiodarone in the past, patient does not consistently take his Eliquis  7. Continue to monitor 2. Peripheral edema 1. Some suspicion for CHF-could be secondary to chemo and/or A. Fib 2. BNP 2800 3. Last echo April 2021 showed an ejection fraction of 45 to 50% with no wall motion abnormality, diastolic function could not be evaluated 4. Update echo 5. Fluid restrictions 6. Strict INO 7. Daily weights 8. Continue metoprolol 9. Continue to monitor 3. Hypokalemia 1. Replace and recheck in a.m. 4. Thyroid disease 1. Check TSH continue  Synthroid    DVT Prophylaxis-   Eliquis- SCDs   AM Labs Ordered, also please review Full Orders  Family Communication: No family at bedside Code Status: DNR  Admission status: Inpatient :The appropriate admission status for this patient is INPATIENT. Inpatient status is judged to be reasonable and necessary in order to provide the required intensity of service to ensure the patient's safety. The patient's presenting symptoms, physical exam findings, and initial radiographic and laboratory data in  the context of their chronic comorbidities is felt to place them at high risk for further clinical deterioration. Furthermore, it is not anticipated that the patient will be medically stable for discharge from the hospital within 2 midnights of admission. The following factors support the admission status of inpatient.     The patient's presenting symptoms include elevated heart rate. The worrisome physical exam findings include A. fib RVR The initial radiographic and laboratory data are worrisome because of elevated BNP and hypokalemia The chronic co-morbidities include colon cancer with mets, thyroid disease       * I certify that at the point of admission it is my clinical judgment that the patient will require inpatient hospital care spanning beyond 2 midnights from the point of admission due to high intensity of service, high risk for further deterioration and high frequency of surveillance required.*  Time spent in minutes : [place]

## 2021-01-16 ENCOUNTER — Observation Stay (HOSPITAL_COMMUNITY): Payer: Medicare Other

## 2021-01-16 ENCOUNTER — Encounter: Payer: Self-pay | Admitting: General Practice

## 2021-01-16 DIAGNOSIS — I9589 Other hypotension: Secondary | ICD-10-CM | POA: Diagnosis present

## 2021-01-16 DIAGNOSIS — Z87891 Personal history of nicotine dependence: Secondary | ICD-10-CM | POA: Diagnosis not present

## 2021-01-16 DIAGNOSIS — N400 Enlarged prostate without lower urinary tract symptoms: Secondary | ICD-10-CM

## 2021-01-16 DIAGNOSIS — N401 Enlarged prostate with lower urinary tract symptoms: Secondary | ICD-10-CM | POA: Diagnosis present

## 2021-01-16 DIAGNOSIS — D696 Thrombocytopenia, unspecified: Secondary | ICD-10-CM | POA: Diagnosis not present

## 2021-01-16 DIAGNOSIS — I4821 Permanent atrial fibrillation: Secondary | ICD-10-CM | POA: Diagnosis present

## 2021-01-16 DIAGNOSIS — Z8042 Family history of malignant neoplasm of prostate: Secondary | ICD-10-CM | POA: Diagnosis not present

## 2021-01-16 DIAGNOSIS — Z9049 Acquired absence of other specified parts of digestive tract: Secondary | ICD-10-CM | POA: Diagnosis not present

## 2021-01-16 DIAGNOSIS — Z9221 Personal history of antineoplastic chemotherapy: Secondary | ICD-10-CM | POA: Diagnosis not present

## 2021-01-16 DIAGNOSIS — Z8673 Personal history of transient ischemic attack (TIA), and cerebral infarction without residual deficits: Secondary | ICD-10-CM | POA: Diagnosis not present

## 2021-01-16 DIAGNOSIS — I517 Cardiomegaly: Secondary | ICD-10-CM | POA: Diagnosis not present

## 2021-01-16 DIAGNOSIS — H409 Unspecified glaucoma: Secondary | ICD-10-CM | POA: Diagnosis present

## 2021-01-16 DIAGNOSIS — Z20822 Contact with and (suspected) exposure to covid-19: Secondary | ICD-10-CM | POA: Diagnosis present

## 2021-01-16 DIAGNOSIS — E119 Type 2 diabetes mellitus without complications: Secondary | ICD-10-CM | POA: Diagnosis present

## 2021-01-16 DIAGNOSIS — Z66 Do not resuscitate: Secondary | ICD-10-CM | POA: Diagnosis present

## 2021-01-16 DIAGNOSIS — C189 Malignant neoplasm of colon, unspecified: Secondary | ICD-10-CM | POA: Diagnosis present

## 2021-01-16 DIAGNOSIS — I110 Hypertensive heart disease with heart failure: Secondary | ICD-10-CM | POA: Diagnosis present

## 2021-01-16 DIAGNOSIS — Z79899 Other long term (current) drug therapy: Secondary | ICD-10-CM | POA: Diagnosis not present

## 2021-01-16 DIAGNOSIS — I5043 Acute on chronic combined systolic (congestive) and diastolic (congestive) heart failure: Secondary | ICD-10-CM | POA: Diagnosis not present

## 2021-01-16 DIAGNOSIS — I5033 Acute on chronic diastolic (congestive) heart failure: Secondary | ICD-10-CM

## 2021-01-16 DIAGNOSIS — Z7901 Long term (current) use of anticoagulants: Secondary | ICD-10-CM | POA: Diagnosis not present

## 2021-01-16 DIAGNOSIS — I495 Sick sinus syndrome: Secondary | ICD-10-CM | POA: Diagnosis present

## 2021-01-16 DIAGNOSIS — I509 Heart failure, unspecified: Secondary | ICD-10-CM | POA: Diagnosis not present

## 2021-01-16 DIAGNOSIS — E039 Hypothyroidism, unspecified: Secondary | ICD-10-CM

## 2021-01-16 DIAGNOSIS — D72828 Other elevated white blood cell count: Secondary | ICD-10-CM | POA: Diagnosis present

## 2021-01-16 DIAGNOSIS — R911 Solitary pulmonary nodule: Secondary | ICD-10-CM | POA: Diagnosis not present

## 2021-01-16 DIAGNOSIS — E876 Hypokalemia: Secondary | ICD-10-CM | POA: Diagnosis not present

## 2021-01-16 DIAGNOSIS — C787 Secondary malignant neoplasm of liver and intrahepatic bile duct: Secondary | ICD-10-CM | POA: Diagnosis present

## 2021-01-16 DIAGNOSIS — D630 Anemia in neoplastic disease: Secondary | ICD-10-CM | POA: Diagnosis not present

## 2021-01-16 DIAGNOSIS — F419 Anxiety disorder, unspecified: Secondary | ICD-10-CM | POA: Diagnosis present

## 2021-01-16 DIAGNOSIS — Z79890 Hormone replacement therapy (postmenopausal): Secondary | ICD-10-CM | POA: Diagnosis not present

## 2021-01-16 DIAGNOSIS — I4891 Unspecified atrial fibrillation: Secondary | ICD-10-CM | POA: Diagnosis not present

## 2021-01-16 LAB — TSH: TSH: 1.216 u[IU]/mL (ref 0.350–4.500)

## 2021-01-16 LAB — BASIC METABOLIC PANEL
Anion gap: 12 (ref 5–15)
BUN: 25 mg/dL — ABNORMAL HIGH (ref 8–23)
CO2: 24 mmol/L (ref 22–32)
Calcium: 8.4 mg/dL — ABNORMAL LOW (ref 8.9–10.3)
Chloride: 105 mmol/L (ref 98–111)
Creatinine, Ser: 0.99 mg/dL (ref 0.61–1.24)
GFR, Estimated: >60 mL/min (ref >60)
Glucose, Bld: 103 mg/dL — ABNORMAL HIGH (ref 70–99)
Potassium: 3.3 mmol/L — ABNORMAL LOW (ref 3.5–5.1)
Sodium: 141 mmol/L (ref 135–145)

## 2021-01-16 LAB — CBC
HCT: 32.1 % — ABNORMAL LOW (ref 39.0–52.0)
Hemoglobin: 10 g/dL — ABNORMAL LOW (ref 13.0–17.0)
MCH: 34.6 pg — ABNORMAL HIGH (ref 26.0–34.0)
MCHC: 31.2 g/dL (ref 30.0–36.0)
MCV: 111.1 fL — ABNORMAL HIGH (ref 80.0–100.0)
Platelets: 27 10*3/uL — CL (ref 150–400)
RBC: 2.89 MIL/uL — ABNORMAL LOW (ref 4.22–5.81)
RDW: 19.1 % — ABNORMAL HIGH (ref 11.5–15.5)
WBC: 7.7 10*3/uL (ref 4.0–10.5)
nRBC: 0 % (ref 0.0–0.2)

## 2021-01-16 LAB — MAGNESIUM: Magnesium: 1.8 mg/dL (ref 1.7–2.4)

## 2021-01-16 MED ORDER — POTASSIUM CHLORIDE CRYS ER 20 MEQ PO TBCR
20.0000 meq | EXTENDED_RELEASE_TABLET | Freq: Every day | ORAL | Status: DC
  Administered 2021-01-16: 20 meq via ORAL
  Filled 2021-01-16 (×2): qty 1

## 2021-01-16 MED ORDER — METOPROLOL TARTRATE 25 MG PO TABS
12.5000 mg | ORAL_TABLET | Freq: Two times a day (BID) | ORAL | Status: DC
  Administered 2021-01-16 – 2021-01-17 (×3): 12.5 mg via ORAL
  Filled 2021-01-16 (×3): qty 1

## 2021-01-16 MED ORDER — FUROSEMIDE 10 MG/ML IJ SOLN
40.0000 mg | Freq: Two times a day (BID) | INTRAMUSCULAR | Status: DC
Start: 2021-01-16 — End: 2021-01-17
  Administered 2021-01-16 – 2021-01-17 (×2): 40 mg via INTRAVENOUS
  Filled 2021-01-16 (×2): qty 4

## 2021-01-16 MED ORDER — DIGOXIN 0.25 MG/ML IJ SOLN
0.5000 mg | Freq: Once | INTRAMUSCULAR | Status: AC
  Administered 2021-01-16: 0.5 mg via INTRAVENOUS
  Filled 2021-01-16: qty 2

## 2021-01-16 MED ORDER — APIXABAN 2.5 MG PO TABS
2.5000 mg | ORAL_TABLET | Freq: Two times a day (BID) | ORAL | Status: DC
  Administered 2021-01-16 – 2021-01-17 (×2): 2.5 mg via ORAL
  Filled 2021-01-16 (×2): qty 1

## 2021-01-16 MED ORDER — CHLORHEXIDINE GLUCONATE CLOTH 2 % EX PADS
6.0000 | MEDICATED_PAD | Freq: Every day | CUTANEOUS | Status: DC
  Administered 2021-01-17: 6 via TOPICAL

## 2021-01-16 MED ORDER — DIGOXIN 125 MCG PO TABS
0.1250 mg | ORAL_TABLET | Freq: Every day | ORAL | Status: DC
  Administered 2021-01-17: 0.125 mg via ORAL
  Filled 2021-01-16: qty 1

## 2021-01-16 NOTE — Progress Notes (Signed)
Patient HR seems to get elevated when he ambulates to the BR. Advised patient to call for assistance when he needs to get up since he has reported two falls within the last month.

## 2021-01-16 NOTE — Progress Notes (Signed)
   01/16/21 1005  Assess: MEWS Score  Temp 97.9 F (36.6 C)  BP (!) 84/67  Pulse Rate (!) 126  Resp 18  SpO2 98 %  O2 Device Room Air  Assess: MEWS Score  MEWS Temp 0  MEWS Systolic 1  MEWS Pulse 2  MEWS RR 0  MEWS LOC 0  MEWS Score 3  MEWS Score Color Yellow  Assess: if the MEWS score is Yellow or Red  Were vital signs taken at a resting state? Yes  Focused Assessment No change from prior assessment  Early Detection of Sepsis Score *See Row Information* Low  MEWS guidelines implemented *See Row Information* Yes  Treat  MEWS Interventions Escalated (See documentation below)  Pain Scale 0-10  Pain Score 0  Take Vital Signs  Increase Vital Sign Frequency  Yellow: Q 2hr X 2 then Q 4hr X 2, if remains yellow, continue Q 4hrs  Escalate  MEWS: Escalate Yellow: discuss with charge nurse/RN and consider discussing with provider and RRT  Notify: Charge Nurse/RN  Name of Charge Nurse/RN Notified todasy  Notify: Provider  Provider Name/Title [facility]  Date Provider Notified 01/16/21  Time Provider Notified 1008  Notification Reason Other (Comment) (BP and Pulse irregular)  Date of Provider Response 01/16/21  Time of Provider Response 1009

## 2021-01-16 NOTE — Progress Notes (Signed)
PROGRESS NOTE    Dylan York  SWH:[id number] DOB: 1926/03/10 DOA: 01/15/2021 PCP: Glenda Chroman, MD   Chief Complaint  Patient presents with  . Chest Pain    Brief admission narrative:  As per H&P written by Dr. Clearence Ped on 01/15/2021. Dylan York  is a 87 y.o. male, with history of tricuspid regurgitation, thyroid disease, sick sinus syndrome, hypertension, colon cancer with mets to liver, atrial fibrillation, and more presents to the ER with a chief complaint of elevated heart rate.  Patient reports that he had a virtual visit with his cardiologist today.  They had him check his vitals before talking to the doctor, and his heart rate was elevated to 150s, so they told him to come into the ER.  Patient reports that he does feel palpitations occasionally.  He reports most likely his palpitations occur during exertion, and last until he has had some time to rest.  Patient reports that he does not have shortness of breath or chest pain with these palpitations.  Patient has been following with the oncologist for his colon cancer.  He is received 5 out of 6 chemo treatments, and his next treatment is supposed to be 24 March.  He has been discussing his hypotension with his oncologist.  He reports a blood pressure as low as 65/50.  His oncologist has taken him from 50 mg of metoprolol twice daily to 25 mg of metoprolol twice daily, and then take him off of metoprolol altogether.  He reports that his last dose of metoprolol was 2 days ago.  Patient reports that he can tell when his blood pressure is low because he feels generalized weakness, so intense that it feels like his neck cannot hold his head up.  He usually goes to bed when this happens, and then when he gets up a couple hours later his blood pressure is normalized per his report.  He is not been told to drink water when his blood pressure is low.  Patient reports that he checks his vitals every day.  His heart rate is usually 70s to 90s.  He  reports the highest he had seen prior to today was 130.  Patient also reports peripheral edema that started 1 week ago and has been progressively worse.  He reports that one time he was on Lasix, he does not know why he was taken off of it.  Recently he was on spironolactone, reports he was taken off that as well.  He stopped wearing his compression stockings for "unknown reasons."  He has remained active as he is the primary caregiver for his wife.  He notes no blistering or weeping of his legs.  He has no erythema, tenderness to his legs.   Patient does report 2 falls within the last month.  The last fall was 10 days ago.  He reports that he feels palpitations prior to the falls, and then falls backwards.  One time he fell backwards in the bathtub, different time he fell backwards in the kitchen.  He reports no loss of consciousness, but did require assistance to get up.  Patient reports that he cannot say for sure if he hit his head or not.  Assessment & Plan: 1-atrial fibrillation with RVR ([place]) -With difficulty controlling rate in the setting of low blood pressure. -Following cardiology recommendations continue low-dose metoprolol and digoxin -Continue to follow electrolytes and monitor on telemetry -Patient denies chest pain -Follow 2D echo.  2-acute on chronic diastolic heart failure -  In the setting of uncontrolled rate and rhythm -No frank crackles on auscultation -Continue adjusted dose of Lasix as per cardiology recommendations -Follow daily weights, low-sodium diet and strict I's and O's.  3-Hypothyroidism -Continue Synthroid  4-Hypokalemia -Follow magnesium level -Continue potassium trend controlled and replete as needed.  5-history of BPH -No urinary retention symptoms -Continue Proscar and Flomax daily.  6-history of colon cancer -Actively followed by oncology service receiving chemotherapy (Folfox) -Stage IV with liver metastases.  7-history of anxiety -Continue as  needed lorazepam.  8-history of glaucoma -Continue Alphagan and Timoptic   DVT prophylaxis: chronically on eliquis Code Status: DNR Family Communication: No family at bedside. Disposition:   Status is: Inpatient  Dispo: The patient is from: Home              Anticipated d/c is to: Home              Patient currently no medically stable for discharge; still experiencing ongoing uncontrolled heart rate and rhythm; also with shortness of breath with activity.   Difficult to place patient no        Consultants:   Cardiology service   Procedures:  See below for x-ray reports   Antimicrobials:  None   Subjective: Afebrile, no chest pain, no nausea, no vomiting.  Reports shortness of breath with activity and intermittent palpitations.  Objective: Vitals:   01/16/21 1005 01/16/21 1202 01/16/21 1336 01/16/21 1752  BP: (!) 84/67 (!) 137/102 (!) 82/64 120/89  Pulse: (!) 126 (!) 106 80 (!) 116  Resp: 18  20 20   Temp: 97.9 F (36.6 C) 98.3 F (36.8 C) (!) 97.5 F (36.4 C) 98 F (36.7 C)  TempSrc: Oral Oral Oral Oral  SpO2: 98% 97% 98%   Weight:      Height:        Intake/Output Summary (Last 24 hours) at 01/16/2021 1840 Last data filed at 01/16/2021 1832 Gross per 24 hour  Intake 960 ml  Output 1050 ml  Net -90 ml   Filed Weights   01/15/21 1945  Weight: 59.6 kg    Examination:  General exam: Appears calm and denying chest pain currently.  Patient expressed no orthopnea.  Which feeling short of breath with activity.  Also experiencing intermittent palpitations.  No nausea, no vomiting. Respiratory system: Clear to auscultation.  No wheezing no frank crackles appreciated.  Decreased breath sounds at the bases. Cardiovascular system: Irregular irregular; no rubs, no gallops. Gastrointestinal system: Abdomen is nondistended, soft and nontender. No organomegaly or masses felt. Normal bowel sounds heard. Central nervous system: Alert and oriented. No focal  neurological deficits. Extremities: No cyanosis or clubbing. Skin: No petechiae. Psychiatry: Judgement and insight appear normal. Mood & affect appropriate.     Data Reviewed: I have personally reviewed following labs and imaging studies  CBC: Recent Labs  Lab 01/15/21 1520 01/16/21 0553  WBC 11.8* 7.7  HGB 10.4* 10.0*  HCT 33.3* 32.1*  MCV 112.5* 111.1*  PLT 32* 27*    Basic Metabolic Panel: Recent Labs  Lab 01/15/21 1520 01/16/21 0553 01/16/21 1729  NA 137  --  141  K 3.4*  --  3.3*  CL 106  --  105  CO2 20*  --  24  GLUCOSE 99  --  103*  BUN 23  --  25*  CREATININE 0.86  --  0.99  CALCIUM 8.4*  --  8.4*  MG  --  1.8  --     GFR: Estimated Creatinine  Clearance: 40.1 mL/min (by C-G formula based on SCr of 0.99 mg/dL).  Liver Function Tests: No results for input(s): AST, ALT, ALKPHOS, BILITOT, PROT, ALBUMIN in the last 168 hours.  CBG: No results for input(s): GLUCAP in the last 168 hours.   Recent Results (from the past 240 hour(s))  Resp Panel by RT-PCR (Flu A&B, Covid) Nasopharyngeal Swab     Status: None   Collection Time: 01/15/21  3:48 PM   Specimen: Nasopharyngeal Swab; Nasopharyngeal(NP) swabs in vial transport medium  Result Value Ref Range Status   SARS Coronavirus 2 by RT PCR NEGATIVE NEGATIVE Final    Comment: (NOTE) SARS-CoV-2 target nucleic acids are NOT DETECTED.  The SARS-CoV-2 RNA is generally detectable in upper respiratory specimens during the acute phase of infection. The lowest concentration of SARS-CoV-2 viral copies this assay can detect is 138 copies/mL. A negative result does not preclude SARS-Cov-2 infection and should not be used as the sole basis for treatment or other patient management decisions. A negative result may occur with  improper specimen collection/handling, submission of specimen other than nasopharyngeal swab, presence of viral mutation(s) within the areas targeted by this assay, and inadequate number of  viral copies(<138 copies/mL). A negative result must be combined with clinical observations, patient history, and epidemiological information. The expected result is Negative.  Fact Sheet for Patients:  EntrepreneurPulse.com.au  Fact Sheet for Healthcare Providers:  IncredibleEmployment.be  This test is no t yet approved or cleared by the [place] FDA and  has been authorized for detection and/or diagnosis of SARS-CoV-2 by FDA under an Emergency Use Authorization (EUA). This EUA will remain  in effect (meaning this test can be used) for the duration of the COVID-19 declaration under Section 564(b)(1) of the Act, 21 U.S.C.section 360bbb-3(b)(1), unless the authorization is terminated  or revoked sooner.       Influenza A by PCR NEGATIVE NEGATIVE Final   Influenza B by PCR NEGATIVE NEGATIVE Final    Comment: (NOTE) The Xpert Xpress SARS-CoV-2/FLU/RSV plus assay is intended as an aid in the diagnosis of influenza from Nasopharyngeal swab specimens and should not be used as a sole basis for treatment. Nasal washings and aspirates are unacceptable for Xpert Xpress SARS-CoV-2/FLU/RSV testing.  Fact Sheet for Patients: EntrepreneurPulse.com.au  Fact Sheet for Healthcare Providers: IncredibleEmployment.be  This test is not yet approved or cleared by the [place] FDA and has been authorized for detection and/or diagnosis of SARS-CoV-2 by FDA under an Emergency Use Authorization (EUA). This EUA will remain in effect (meaning this test can be used) for the duration of the COVID-19 declaration under Section 564(b)(1) of the Act, 21 U.S.C. section 360bbb-3(b)(1), unless the authorization is terminated or revoked.  Performed at [facility], [address] [place], [place] [address]      Radiology Studies: DG Chest 2 View  Result Date: 01/15/2021 CLINICAL DATA:  Chest pain EXAM: CHEST - 2 VIEW  COMPARISON:  CT 03/06/2020 FINDINGS: Right Port-A-Cath in place with the tip at the cavoatrial junction. Heart is normal size. There is hyperinflation of the lungs compatible with COPD. Multiple nodular densities project over both lungs, the largest the 3 cm rounded area projecting over the left mid lung peripherally. No effusions. No acute bony abnormality. IMPRESSION: Nodular densities project over both lungs including 3 cm rounded mass in the left mid lung region. Recommend chest CT for further evaluation. COPD. Electronically Signed   By: Rolm Baptise M.D.   On: 01/15/2021 15:08   DG Chest  Port 1 View  Result Date: 01/16/2021 CLINICAL DATA:  CHF EXAM: PORTABLE CHEST 1 VIEW COMPARISON:  Yesterday FINDINGS: Mild cardiomegaly. Large lung volumes with diaphragm flattening. Hazy density at the bases suggesting atelectasis. Redemonstrated 2.8 cm nodule over the lateral left chest. Smaller nodules are also seen on the right. Porta catheter with tip at the SVC. IMPRESSION: Stable compared to yesterday.  No evident CHF. Pulmonary nodules. Electronically Signed   By: Monte Fantasia M.D.   On: 01/16/2021 05:36    Scheduled Meds: . brimonidine  1 drop Both Eyes BID   And  . timolol  1 drop Both Eyes BID  . [START ON 01/17/2021] digoxin  0.125 mg Oral Daily  . finasteride  5 mg Oral Daily  . furosemide  40 mg Intravenous BID  . levothyroxine  75 mcg Oral Q0600  . metoprolol tartrate  12.5 mg Oral BID  . potassium chloride  20 mEq Oral Daily  . tamsulosin  0.4 mg Oral QHS   Continuous Infusions:   LOS: 0 days    Time spent: 30 minutes   Barton Dubois, MD [facility]   To contact the attending provider between 7A-7P or the covering provider during after hours 7P-7A, please log into the web site [email or web address] and access using universal [place] password for that web site. If you do not have the password, please call the hospital operator.  01/16/2021, 6:40 PM

## 2021-01-16 NOTE — Progress Notes (Signed)
Patients HR remains in 130-140 , Given PO metoprolol 12.5 mg, when moving around in room HR goes to 150 at times, Dr. Dyann Kief is aware . Will continue to monitor patient denies dizziness at this time.

## 2021-01-16 NOTE — Plan of Care (Signed)

## 2021-01-16 NOTE — Progress Notes (Signed)
   01/16/21 1336  Vitals  Temp (!) 97.5 F (36.4 C)  Temp Source Oral  BP (!) 82/64  MAP (mmHg) 71  BP Location Left Arm  BP Method Automatic  Patient Position (if appropriate) Lying  Pulse Rate 80  Resp 20  MEWS COLOR  MEWS Score Color Green  Oxygen Therapy  SpO2 98 %  O2 Device Room Air  MEWS Score  MEWS Temp 0  MEWS Systolic 1  MEWS Pulse 0  MEWS RR 0  MEWS LOC 0  MEWS Score 1

## 2021-01-16 NOTE — Progress Notes (Signed)
   01/16/21 1752  Assess: MEWS Score  Temp 98 F (36.7 C)  BP 120/89  Pulse Rate (!) 116  Resp 20  Assess: MEWS Score  MEWS Temp 0  MEWS Systolic 0  MEWS Pulse 2  MEWS RR 0  MEWS LOC 0  MEWS Score 2  MEWS Score Color Yellow  Assess: if the MEWS score is Yellow or Red  Were vital signs taken at a resting state? Yes  Focused Assessment No change from prior assessment  Early Detection of Sepsis Score *See Row Information* Low  MEWS guidelines implemented *See Row Information* No, previously yellow, continue vital signs every 4 hours

## 2021-01-16 NOTE — Progress Notes (Signed)
Patients heart rate is 140s, has received metaprol. Will check back later.

## 2021-01-16 NOTE — Consult Note (Addendum)
Cardiology Consult    Patient ID: Dylan Dylan; [id number]; Mar 30, 1926   Admit date: 01/15/2021 Date of Consult: 01/16/2021  Primary Care Provider: Glenda Chroman, MD Primary Cardiologist: Dylan Majestic, MD   Patient Profile    Dylan Dylan is a 87 y.o. male with past medical history of permanent atrial fibrillation, chronic combined systolic and diastolic CHF (EF 62-56% by echo in 02/2020), HTN, history of CVA, hypothyroidism, anemia, colon cancer (s/p partial colectomy in 04/2020, recurrent metastatic disease to the liver by imaging in 09/2020 and currently undergoing chemotherapy) who is being seen today for the evaluation of atrial fibrillation with RVR at the request of Dylan Dylan.   History of Present Illness    Dylan Dylan was last examined by Dylan Dylan in 02/2020 and reported recent melena and had recently undergone a colonoscopy demonstrating colon cancer. Had been continued on Eliquis at that time and HR was at 104 during his visit. By review of his note, the patient was previously on Amiodarone in the past but this was discontinued in 2019 due to interval elevation of LFT's which led to discontinuation of both Amiodarone and Atorvastatin. He did increase his Toprol-XL from 37.5mg  BID to 50mg  BID and a repeat echocardiogram was recommended for preop evaluation given his upcoming surgery for colon cancer. His echo showed a low EF of 45-50% with mildly reduced RV function, severe biatrial dilation, mild MR, moderate TR, and aortic valve sclerosis without stenosis.  By review of interval notes, he started chemotherapy for metastatic colon cancer. He did call the office in 11/2020 reporting hypotension since starting treatments and Aldactone was discontinued but hypotension persisted which led to Toprol-XL being reduced from 50mg  BID to 50mg  daily.   Most recently called the office on 01/14/2021 reporting fatigue and BP has been at 85-68, 65/50, 110/81 and 80/67 when checked at home.  He was advised to hold his Toprol-XL and a follow-up telehealth visit was arranged for 3/15 but upon him telling them his HR was in the 120's to 130's, he was sent to the ED for further evaluation.   Upon arrival to the ED, rates remained elevated and he was not felt to be a DCCV candidate as he reported frequently missing his 2nd dose of Eliquis. Initial labs showed WBC 11.8, Hgb 10.4, platelets 32K, NA+ 137, K+ 3.4 and creatinine 0.86. CMET pending but AST was at 43 and ALT 16 by labs in 01/2021. Initial HS Troponin 14 and repeat 14. BNP elevated to 2845. COVID negative. TSH 1.216. CXR with nodular densities over both lungs with Chest CT recommended. EKG showed atrial fibrillation with RVR, HR 139 and ST depression along the lateral leads which is more prominent now as compared to prior tracings but likely rate-related.   He did receive IV Lopressor while in the ED along with 25mg  PO Lopressor but no doses have been ordered for this AM. Was started on IV Lasix 40mg  daily and I&O's have not been recorded.  In talking with the patient this morning, he denies any current palpitations but HR remains in the 120's (just received Lopressor 12.5mg ). Denies any specific dyspnea but has noticed lower extremity edema over the past few weeks. Says he was experiencing fatigue, dizziness and presyncope with hypotension but symptoms have now resolved. Says he is not active at home but is the primary caregiver for his wife who has dementia and says this can be physically demanding at times. They do have a caregiver that helps  with meals and he is unsure if salt is added to the food.    Past Medical History:  Diagnosis Date  . Atrial fibrillation ([place])   . Atrial tachycardia ([place])   . Biatrial enlargement    severe biatrial enlargement  . BPH (benign prostatic hyperplasia)   . Colon cancer ([place])   . Diabetes mellitus without complication ([place])   . Glaucoma   . Graded compression stocking in place   .  Hypertension   . Hypothyroidism   . Neutropenia ([place])   . Rosacea   . Sinus bradycardia, with HR in the 40s BB stopped. 08/08/2013  . SSS (sick sinus syndrome) ([place])   . Stroke [facility])    no residual  . Thyroid disease   . Tricuspid regurgitation     Past Surgical History:  Procedure Laterality Date  . APPENDECTOMY    . BACTERIAL OVERGROWTH TEST N/A 01/14/2016   Procedure: BACTERIAL OVERGROWTH TEST;  Surgeon: Rogene Houston, MD;  Location[facility];  Service: Endoscopy;  Laterality: N/A;  730  . BIOPSY  02/09/2020   Procedure: BIOPSY;  Surgeon: Rogene Houston, MD;  Location: [facility];  Service: Endoscopy;;  . COLONOSCOPY N/A 02/09/2020   Procedure: COLONOSCOPY;  Surgeon: Rogene Houston, MD;  Location: [facility];  Service: Endoscopy;  Laterality: N/A;  1225  . HERNIA REPAIR    . INGUINAL HERNIA REPAIR Right   . IR IMAGING GUIDED PORT INSERTION  10/08/2020  . POLYPECTOMY  02/09/2020   Procedure: POLYPECTOMY;  Surgeon: Rogene Houston, MD;  Location: [facility];  Service: Endoscopy;;  . TRANSURETHRAL RESECTION OF PROSTATE    . UMBILICAL HERNIA REPAIR       Home Medications:  Prior to Admission medications   Medication Sig Start Date End Date Taking? Authorizing Provider  apixaban (ELIQUIS) 2.5 MG TABS tablet Take 1 tablet (2.5 mg total) by mouth 2 (two) times daily. 01/09/21  Yes Ladell Pier, MD  COMBIGAN 0.2-0.5 % ophthalmic solution Place 1 drop into both eyes 2 (two) times daily. 05/13/14  Yes [provider]  finasteride (PROSCAR) 5 MG tablet Take 5 mg by mouth daily.   Yes [provider]  levothyroxine (SYNTHROID, LEVOTHROID) 75 MCG tablet Take 75 mcg by mouth daily before breakfast.  08/06/18  Yes [provider]  lidocaine-prilocaine (EMLA) cream Apply to port site 1-2 hours prior to use 09/21/20  Yes Owens Shark, NP  LORazepam (ATIVAN) 1 MG tablet Take 0.5 mg by mouth at bedtime as needed for sleep.  01/18/16  Yes [provider]  prochlorperazine (COMPAZINE) 5 MG tablet Take 1 tablet (5 mg total) by mouth every 6 (six) hours as needed for nausea or vomiting. 09/21/20  Yes Owens Shark, NP  tamsulosin (FLOMAX) 0.4 MG CAPS capsule Take 1 capsule (0.4 mg total) by mouth at bedtime. 06/13/20  Yes Troy Sine, MD  Vitamin D, Cholecalciferol, 50 MCG (2000 UT) CAPS Take 2,000 Units by mouth daily.    Yes [provider]  docusate sodium (COLACE) 100 MG capsule Take 2 capsules (200 mg total) by mouth at bedtime. Patient not taking: No sig reported 04/13/18   Rogene Houston, MD  spironolactone (ALDACTONE) 25 MG tablet  01/15/21   [provider]    Inpatient Medications: Scheduled Meds: . brimonidine  1 drop Both Eyes BID   And  . timolol  1 drop Both Eyes BID  . finasteride  5 mg  Oral Daily  . furosemide  40 mg Intravenous BID  . levothyroxine  75 mcg Oral Q0600  . metoprolol tartrate  12.5 mg Oral BID  . tamsulosin  0.4 mg Oral QHS   Continuous Infusions:  PRN Meds: acetaminophen **OR** acetaminophen, LORazepam, metoprolol tartrate, morphine injection, ondansetron **OR** ondansetron (ZOFRAN) IV, oxyCODONE, polyethylene glycol, prochlorperazine  Allergies:    Allergies  Allergen Reactions  . Amiodarone Other (See Comments)    Not good for liver  . Multaq [Dronedarone] Shortness Of Breath    Social History:   Social History   Socioeconomic History  . Marital status: Married    Spouse name: Not on file  . Number of children: Not on file  . Years of education: Not on file  . Highest education level: Not on file  Occupational History  . Not on file  Tobacco Use  . Smoking status: Former Smoker    Quit date: 08/1977    Years since quitting: 43.4  . Smokeless tobacco: Never Used  Vaping Use  . Vaping Use: Never used  Substance and Sexual Activity  . Alcohol use: No    Alcohol/week: 0.0 standard drinks  . Drug use: No  . Sexual activity: Not on file  Other Topics  Concern  . Not on file  Social History Narrative  . Not on file   Social Determinants of Health   Financial Resource Strain: Low Risk   . Difficulty of Paying Living Expenses: Not hard at all  Food Insecurity: No Food Insecurity  . Worried About Charity fundraiser in the Last Year: Never true  . Ran Out of Food in the Last Year: Never true  Transportation Needs: No Transportation Needs  . Lack of Transportation (Medical): No  . Lack of Transportation (Non-Medical): No  Physical Activity: Inactive  . Days of Exercise per Week: 0 days  . Minutes of Exercise per Session: 0 min  Stress: No Stress Concern Present  . Feeling of Stress : Not at all  Social Connections: Socially Integrated  . Frequency of Communication with Friends and Family: More than three times a week  . Frequency of Social Gatherings with Friends and Family: Three times a week  . Attends Religious Services: 1 to 4 times per year  . Active Member of Clubs or Organizations: No  . Attends Archivist Meetings: 1 to 4 times per year  . Marital Status: Married  Human resources officer Violence: Not At Risk  . Fear of Current or Ex-Partner: No  . Emotionally Abused: No  . Physically Abused: No  . Sexually Abused: No     Family History:    Family History  Problem Relation Age of Onset  . Cancer Mother   . Heart Problems Father        atherosclerosis  . Cancer - Prostate Paternal Grandfather       Review of Systems    General:  No chills, fever, night sweats or weight changes. Positive for fatigue and dizziness.  Cardiovascular:  No chest pain, dyspnea on exertion, paroxysmal nocturnal dyspnea. Positive for palpitations and edema.  Dermatological: No rash, lesions/masses Respiratory: No cough, dyspnea Urologic: No hematuria, dysuria Abdominal:   No nausea, vomiting, diarrhea, bright red blood per rectum, melena, or hematemesis Neurologic:  No visual changes, wkns, changes in mental status. All other  systems reviewed and are otherwise negative except as noted above.  Physical Exam/Data    Vitals:   01/15/21 1945 01/16/21 0220 01/16/21 0526 01/16/21  1005  BP: (!) 132/100 (!) 122/93 (!) 128/96 (!) 84/67  Pulse: (!) 118 (!) 108 (!) 103 (!) 126  Resp: 18 15 18 18   Temp: 97.8 F (36.6 C) 97.8 F (36.6 C) 97.7 F (36.5 C) 97.9 F (36.6 C)  TempSrc: Oral Oral Oral Oral  SpO2: 99% 97% 98% 98%  Weight: 59.6 kg     Height: 5\' 8"  (1.727 m)      No intake or output data in the 24 hours ending 01/16/21 1017 Filed Weights   01/15/21 1945  Weight: 59.6 kg   Body mass index is 19.98 kg/m.   General: Pleasant, thin male appearing in NAD Psych: Normal affect. Neuro: Alert and oriented X 3. Moves all extremities spontaneously. HEENT: Normal  Neck: Supple without bruits. JVD at 9 cm. Lungs:  Resp regular and unlabored, rales along bases bilaterally. Heart: Irregularly irregular. no s3, s4, 2/6 SEM along RUSB.  Abdomen: Soft, non-tender, non-distended, BS + x 4.  Extremities: No clubbing or cyanosis. 1+ pitting edema bilaterally. DP/PT/Radials 2+ and equal bilaterally.   EKG:  The EKG was personally reviewed and demonstrates:  Atrial fibrillation with RVR, HR 139 and ST depression along the lateral leads which is more prominent now as compared to prior tracings.   Telemetry:  Telemetry was personally reviewed and demonstrates:  Atrial fibrillation, HR in 90's to 120's.    Labs/Studies     Relevant CV Studies:  Event Monitor: 06/2019 The patient was monitored for 7 days and 16 hours from June 28 through May 09, 2019.  Throughout the entire monitoring period, the patient was in atrial fibrillation.  AF rate ranged from 50 to a maximum of 154 bpm with an average rate at 86 bpm.  There were rare isolated PVCs and couplets less than 1%.  There was an episode of ventricular bigeminy for 3.6 seconds and an episode of ventricular trigeminy for 13.9 seconds.  There were no significant pauses.   There were no episodes of ventricular tachycardia.  Echocardiogram: 02/2020 IMPRESSIONS    1. Left ventricular ejection fraction, by estimation, is 45 to 50%. The  left ventricle has mildly decreased function. The left ventricle has no  regional wall motion abnormalities. Left ventricular diastolic function  could not be evaluated.  2. Right ventricular systolic function is mildly reduced. The right  ventricular size is moderately enlarged. There is moderately elevated  pulmonary artery systolic pressure.  3. Left atrial size was severely dilated.  4. Right atrial size was severely dilated.  5. The mitral valve is normal in structure. Mild mitral valve  regurgitation. No evidence of mitral stenosis.  6. Tricuspid valve regurgitation is moderate.  7. The aortic valve is tricuspid. Aortic valve regurgitation is trivial.  Mild to moderate aortic valve sclerosis/calcification is present, without  any evidence of aortic stenosis.  8. The inferior vena cava is dilated in size with >50% respiratory  variability, suggesting right atrial pressure of 8 mmHg.   Conclusion(s)/Recommendation(s): Visually EF appears low normal, down  slightly from EF in 2017. No focal wall motion abnormalities. Otherwise  echo largely unchanged.   Laboratory Data:  Chemistry Recent Labs  Lab 01/15/21 1520  NA 137  K 3.4*  CL 106  CO2 20*  GLUCOSE 99  BUN 23  CREATININE 0.86  CALCIUM 8.4*  GFRNONAA >60  ANIONGAP 11    No results for input(s): PROT, ALBUMIN, AST, ALT, ALKPHOS, BILITOT in the last 168 hours. Hematology Recent Labs  Lab 01/15/21 1520 01/16/21  0553  WBC 11.8* 7.7  RBC 2.96* 2.89*  HGB 10.4* 10.0*  HCT 33.3* 32.1*  MCV 112.5* 111.1*  MCH 35.1* 34.6*  MCHC 31.2 31.2  RDW 19.0* 19.1*  PLT 32* 27*   Cardiac EnzymesNo results for input(s): TROPONINI in the last 168 hours. No results for input(s): TROPIPOC in the last 168 hours.  BNP Recent Labs  Lab 01/15/21 1520   BNP 2,845.0*    DDimer No results for input(s): DDIMER in the last 168 hours.  Radiology/Studies:  DG Chest 2 View  Result Date: 01/15/2021 CLINICAL DATA:  Chest pain EXAM: CHEST - 2 VIEW COMPARISON:  CT 03/06/2020 FINDINGS: Right Port-A-Cath in place with the tip at the cavoatrial junction. Heart is normal size. There is hyperinflation of the lungs compatible with COPD. Multiple nodular densities project over both lungs, the largest the 3 cm rounded area projecting over the left mid lung peripherally. No effusions. No acute bony abnormality. IMPRESSION: Nodular densities project over both lungs including 3 cm rounded mass in the left mid lung region. Recommend chest CT for further evaluation. COPD. Electronically Signed   By: Rolm Baptise M.D.   On: 01/15/2021 15:08   DG Chest Port 1 View  Result Date: 01/16/2021 CLINICAL DATA:  CHF EXAM: PORTABLE CHEST 1 VIEW COMPARISON:  Yesterday FINDINGS: Mild cardiomegaly. Large lung volumes with diaphragm flattening. Hazy density at the bases suggesting atelectasis. Redemonstrated 2.8 cm nodule over the lateral left chest. Smaller nodules are also seen on the right. Porta catheter with tip at the SVC. IMPRESSION: Stable compared to yesterday.  No evident CHF. Pulmonary nodules. Electronically Signed   By: Monte Fantasia M.D.   On: 01/16/2021 05:36     Assessment & Plan    1. Atrial Fibrillation with RVR - He has known permanent atrial fibrillation but rates have been elevated at home as Toprol-XL was reduced earlier this year due to hypotension and he held the medication 2 days prior to admission as his SBP was in the 60's at times.  - Rates have been in the 90's to 120's while on the floor and BP at 128/96 on most recent check. Will try adding back Lopressor at a lower dose of 12.5mg  BID (was on Toprol-XL 50mg  daily prior to admission). If rates remain elevated or BP drops, may need to consider the use of Amiodarone or Digoxin. Digoxin is not ideal  given his age but renal function is stable. He was previously on Amiodarone for several years prior to 2019 but this was discontinued at that time due to interval development of elevated LFT's. He does have known liver mets as outlined above but may need to consider a short-course if rates do not improve with the above measures.  - This patients CHA2DS2-VASc Score and unadjusted Ischemic Stroke Rate (% per year) is equal to 11.2 % stroke rate/year from a score of 7 (CHF, HTN, Vascular, Age (2), CVA (2)). On Eliquis 2.5mg  BID prior to admission as dosing was previously reduced by Oncology given his thrombocytopenia. Given his platelet count at 27 K this AM, will hold Eliquis for now.   2. Acute on Chronic Combined Systolic and Diastolic CHF - He does report lower extremity edema and orthopnea for the past week. BNP elevated to 2845 on admission and a repeat echocardiogram has been ordered but would wait until his rates improve to obtain. His EF was previously at 45-50% by echo in 02/2020. - Currently on Lasix 40mg  daily. Will titrate to BID  dosing given volume overload by examination. Repeat BMET in AM. Follow I&O's along with daily weights. May require further medication adjustments pending echo results but if found to have a cardiomyopathy, medical therapy options are going to be limited in the setting of hypotension.   3. Metastatic Colon Cancer - He is s/p partial colectomy in 04/2020 with recurrent metastatic disease to the liver by imaging in 09/2020 and currently undergoing chemotherapy. Last treatment was on 01/11/2021.  4. Thrombocytopenia - In talking with the patient and by review of notes, this has worsened with his treatments and has led to several treatments being held. Platelets at 32 K on admission and 27 K this AM. Will defer further management to the admitting team but may need to review with his Oncologist.  - He denies any evidence of active bleeding but given the significant decline in  his platelets, will hold Eliquis for now.    For questions or updates, please contact [place] Please consult [email or web address] for contact info under Cardiology/STEMI.  Signed, Erma Heritage, PA-C 01/16/2021, 10:17 AM Pager: [phone]  Pt seen and examined   I agree with findings as noted by B Strader.   Pt is a 87 yo with known permanent atrial fibrillation (rate control and anticoagulation) as well as mild LV and RV  dysfunction on echo (April 2021)  The pt was taken off of amiodarone and statin in the past when developed elevated LFTs.   The patient was last seen in cardiology in Sept 2021  Called office in Jan with low BP   Told to pull back meds as noted above   Again, called on 3/14 and meds pulled back Had a televisit on 3/15 and HR found to be elevated (120s to 130s)  Told to go to ED  Following now with B [facility] for metastatic colon Ca.  Undergoing chemotherapy with FOLFOX.    The pt does not sense high heart rates.   He did have dizziess at home No syncope  No CP   Uses furniture to get around   He does say he cares for wife who is at home with dementia Note that he has missed intermitt Eliquis doses (PM dose) Dose was also cut back with decrease platelets   SInce admit the pt has been given IV lasix and IV lopressor as well as PO lopressor    He is comfortable in bed    ON exam, pt in NAD Neck:  JVP is minimally elevated  Lungs are CTA Cardiac exam:  Irreg irreg   No S3   No signif murmurs  Abd is suppler Ext with triv edema    (L greater than R)      Current tele heart rates have improved from admit.  Now 80s to 100    BP hs been up 120s to 130s for the most part with occasional 80s     Echo is pending    1.  Atrial fibrillation  Pt has had meds pulled back due to hypotension  Has received some IV here and now on low dose PO    I would try low dose dig   Give 0.5 IV x 1  Then 0.125 daily If this does not keep rates controlled may need to think of po  amiodarone for rate control only      Keep on Eliquis   2.5 bid   Follow H and H  Await echo  2  Acute on chronic systolic /  diastolic CHF   Exacerbated by rapid heart rates   Agree with dosing of lasix     Would reassess volume and labs  in AM     Replete K as needed  3  Blood pressure   Hx of HTN   Now BP is low    Follow closely with meds   4 Colon CA   Followed by B Sherrill   Undergoing chemotherapy with FOLFOX  5   Dispo  Pt lives at home with wife  He is her caregiver   Admits he needs help  Will continue to follow   Dorris Carnes MD

## 2021-01-17 ENCOUNTER — Inpatient Hospital Stay (HOSPITAL_COMMUNITY): Payer: Medicare Other

## 2021-01-17 ENCOUNTER — Encounter (HOSPITAL_COMMUNITY): Payer: Self-pay | Admitting: Internal Medicine

## 2021-01-17 ENCOUNTER — Inpatient Hospital Stay: Payer: Medicare Other

## 2021-01-17 DIAGNOSIS — I5033 Acute on chronic diastolic (congestive) heart failure: Secondary | ICD-10-CM

## 2021-01-17 DIAGNOSIS — D696 Thrombocytopenia, unspecified: Secondary | ICD-10-CM

## 2021-01-17 DIAGNOSIS — D630 Anemia in neoplastic disease: Secondary | ICD-10-CM

## 2021-01-17 DIAGNOSIS — I509 Heart failure, unspecified: Secondary | ICD-10-CM

## 2021-01-17 LAB — BASIC METABOLIC PANEL
Anion gap: 9 (ref 5–15)
BUN: 24 mg/dL — ABNORMAL HIGH (ref 8–23)
CO2: 25 mmol/L (ref 22–32)
Calcium: 8.4 mg/dL — ABNORMAL LOW (ref 8.9–10.3)
Chloride: 104 mmol/L (ref 98–111)
Creatinine, Ser: 0.93 mg/dL (ref 0.61–1.24)
GFR, Estimated: >60 mL/min (ref >60)
Glucose, Bld: 91 mg/dL (ref 70–99)
Potassium: 3.2 mmol/L — ABNORMAL LOW (ref 3.5–5.1)
Sodium: 138 mmol/L (ref 135–145)

## 2021-01-17 LAB — CBC
HCT: 30.6 % — ABNORMAL LOW (ref 39.0–52.0)
Hemoglobin: 9.8 g/dL — ABNORMAL LOW (ref 13.0–17.0)
MCH: 35 pg — ABNORMAL HIGH (ref 26.0–34.0)
MCHC: 32 g/dL (ref 30.0–36.0)
MCV: 109.3 fL — ABNORMAL HIGH (ref 80.0–100.0)
Platelets: 28 10*3/uL — CL (ref 150–400)
RBC: 2.8 MIL/uL — ABNORMAL LOW (ref 4.22–5.81)
RDW: 18.6 % — ABNORMAL HIGH (ref 11.5–15.5)
WBC: 7.8 10*3/uL (ref 4.0–10.5)
nRBC: 0 % (ref 0.0–0.2)

## 2021-01-17 LAB — ECHOCARDIOGRAM COMPLETE
Area-P 1/2: 2.97 cm2
Height: 68 in
P 1/2 time: 714 msec
S' Lateral: 2.32 cm
Weight: 2049.4 oz

## 2021-01-17 MED ORDER — APIXABAN 2.5 MG PO TABS: 2.5000 mg | ORAL_TABLET | Freq: Two times a day (BID) | ORAL | Status: DC

## 2021-01-17 MED ORDER — POTASSIUM CHLORIDE CRYS ER 20 MEQ PO TBCR
40.0000 meq | EXTENDED_RELEASE_TABLET | Freq: Every day | ORAL | Status: DC
  Administered 2021-01-17: 40 meq via ORAL
  Filled 2021-01-17: qty 2

## 2021-01-17 MED ORDER — DIGOXIN 125 MCG PO TABS: 0.1250 mg | ORAL_TABLET | Freq: Every day | ORAL | 1 refills | Status: DC

## 2021-01-17 MED ORDER — METOPROLOL TARTRATE 25 MG PO TABS: 12.5000 mg | ORAL_TABLET | Freq: Two times a day (BID) | ORAL | 1 refills | Status: DC

## 2021-01-17 MED ORDER — MAGNESIUM SULFATE 2 GM/50ML IV SOLN
2.0000 g | Freq: Once | INTRAVENOUS | Status: AC
  Administered 2021-01-17: 2 g via INTRAVENOUS
  Filled 2021-01-17: qty 50

## 2021-01-17 NOTE — Discharge Summary (Signed)
Physician Discharge Summary  Dylan York XTG:[id number] DOB: [birth year] DOA: 01/15/2021  PCP: Glenda Chroman, MD  Admit date: 01/15/2021 Discharge date: 01/17/2021  Time spent: 35 minutes  Recommendations for Outpatient Follow-up:  1. Repeat CBC to follow hemoglobin and platelet count 2. Repeat basic metabolic panel electrolytes and renal function.   Discharge Diagnoses:  Principal Problem:   Atrial fibrillation with RVR (HCC) Active Problems:   Hypothyroidism   Peripheral edema   Hypokalemia   CHF (congestive heart failure) (HCC)   Thrombocytopenia (HCC) Neoplastic anemia  Discharge Condition: Stable and improved.  Discharged home with instruction to follow-up with PCP and cardiology services outpatient.  CODE STATUS: Full code.  Diet recommendation: Heart healthy diet.  Filed Weights   01/15/21 1945 01/17/21 0448  Weight: 59.6 kg 58.1 kg    History of present illness:  As per H&P written by Dr. Clearence Ped on 01/15/2021. Dylan York a87 y.o.male,with history of tricuspid regurgitation, thyroid disease, sick sinus syndrome, hypertension, colon cancer with mets to liver, atrial fibrillation, and more presents to the ER with a chief complaint of elevated heart rate. Patient reports that he had a virtual visit with his cardiologist today. They had him check his vitals before talking to the doctor, and his heart rate was elevated to 150s, so they told him to come into the ER. Patient reports that he does feel palpitations occasionally. He reports most likely his palpitations occur during exertion, and last until he has had some time to rest. Patient reports that he does not have shortness of breath or chest pain with these palpitations. Patient has been following with the oncologist for his colon cancer. He is received 5 out of 6 chemo treatments, and his next treatment is supposed to be 24 March. He has been discussing his hypotension with his oncologist. He  reports a blood pressure as low as 65/50. His oncologist has taken him from 50 mg of metoprolol twice daily to 25 mg of metoprolol twice daily, and then take him off of metoprolol altogether. He reports that his last dose of metoprolol was 2 days ago. Patient reports that he can tell when his blood pressure is low because he feels generalized weakness, so intense that it feels like his neck cannot hold his head up. He usually goes to bed when this happens, and then when he gets up a couple hours later his blood pressure is normalized per his report. He is not been told to drink water when his blood pressure is low. Patient reports that he checks his vitals every day. His heart rate is usually 70s to 90s. He reports the highest he had seen prior to today was 130. Patient also reports peripheral edema that started 1 week ago and has been progressively worse. He reports that one time he was on Lasix, he does not know why he was taken off of it. Recently he was on spironolactone, reports he was taken off that as well. He stopped wearing his compression stockings for "unknown reasons." He has remained active as he is the primary caregiver for his wife. He notes no blistering or weeping of his legs. He has no erythema, tenderness to his legs.  Patient does report 2 falls within the last month. The last fall was 10 days ago. He reports that he feels palpitations prior to the falls, and then falls backwards. One time he fell backwards in the bathtub, different time he fell backwards in the kitchen. He reports no loss of consciousness,  but did require assistance to get up. Patient reports that he cannot say for sure if he hit his head or not.  Hospital Course:  1-atrial fibrillation with RVR ([place]) -With difficulty controlling rate in the setting of low blood pressure.  Vital signs stable currently. -Following cardiology recommendations continue low-dose metoprolol and digoxin at time of  discharge. -Continue to follow electrolytes with repeat basic metabolic panel at follow-up visit. -Patient denies chest pain, shortness of breath and orthopnea. -2D echo with 60-65% ejection fraction, no wall motion abnormalities and demonstrating severe biatrial enlargement and aortic valve sclerosis.  2-acute on chronic diastolic heart failure -In the setting of uncontrolled rate and rhythm -No frank crackles on auscultation -Patient denies shortness of breath and orthopnea -Discharged home with instruction to follow low-sodium diet and to check his weight on daily basis. -No diuretics prescribed. -Continue outpatient follow-up with cardiology service -2D echo with preserved ejection fraction and no wall motion abnormalities.  3-Hypothyroidism -Continue Synthroid  4-Hypokalemia -Repleted and within normal limits at discharge -Magnesium also within normal limits. -Repeat basic metabolic panel follow-up visit to reassess electrolytes stability.  5-history of BPH -No urinary retention symptoms -Continue Proscar and Flomax daily.  6-history of colon cancer -Actively followed by oncology service receiving chemotherapy (Folfox) -Stage IV with liver metastases.  7-history of anxiety -Continue as needed lorazepam.  8-history of glaucoma -Continue Alphagan and Timoptic  9-neoplastic anemia and thrombocytopenia -In the setting of chemotherapy  -No overt bleeding or transfusion requirement currently. -Repeat CBC to follow hemoglobin and platelet count -Following recommendations by cardiology service Eliquis will be placed on hold to decrease the chances of a spontaneous bleeding.  Procedures:  See below for x-ray reports  2D echo: 1. Left ventricular ejection fraction, by estimation, is 60 to 65%. The  left ventricle has normal function. The left ventricle has no regional  wall motion abnormalities. There is mild left ventricular hypertrophy.  Left ventricular diastolic  parameters  are indeterminate.  2. Right ventricular systolic function is normal. The right ventricular  size is moderately enlarged. There is mildly elevated pulmonary artery  systolic pressure. The estimated right ventricular systolic pressure is  97.0 mmHg.  3. Left atrial size was severely dilated.  4. Right atrial size was severely dilated.  5. The mitral valve is abnormal. Trivial mitral valve regurgitation.  Moderate mitral annular calcification.  6. Tricuspid valve regurgitation is moderate.  7. The aortic valve is tricuspid. There is moderate calcification of the  aortic valve. Aortic valve regurgitation is trivial. Mild to moderate  aortic valve sclerosis/calcification is present, without any evidence of  aortic stenosis. Aortic regurgitation  PHT measures 714 msec.  8. The inferior vena cava is dilated in size with >50% respiratory  variability, suggesting right atrial pressure of 8 mmHg.   Consultations:  Cardiology service  Discharge Exam: Vitals:   01/17/21 0830 01/17/21 1325  BP:  106/70  Pulse: 91 84  Resp:  18  Temp:  (!) 97.4 F (36.3 C)  SpO2:  98%    General: No fever, no chest pain, no nausea, no vomiting.  Patient expressed no significant palpitations and denies any dizziness, lightheadedness or any other acute complaints.  Reports no dyspnea on exertion and denies orthopnea. Cardiovascular: Rate controlled; irregular, no rubs, no gallops, no JVD on exam. Respiratory: Good air movement bilaterally, no wheezing no crackles Abdomen: Soft, nontender, nondistended, positive bowel sounds Extremities: No cyanosis, no clubbing, no edema.  Discharge Instructions   Discharge Instructions    (  HEART FAILURE PATIENTS) Call MD:  Anytime you have any of the following symptoms: 1) 3 pound weight gain in 24 hours or 5 pounds in 1 week 2) shortness of breath, with or without a dry hacking cough 3) swelling in the hands, feet or stomach 4) if you have to sleep  on extra pillows at night in order to breathe.   Complete by: As directed    Diet - low sodium heart healthy   Complete by: As directed    Discharge instructions   Complete by: As directed    Watch amount of sodium intake Take medications as prescribed Follow-up with cardiology as instructed Follow-up with PCP in 10 days Maintain adequate hydration Check your weight on daily basis.     Allergies as of 01/17/2021      Reactions   Amiodarone Other (See Comments)   Not good for liver   Multaq [dronedarone] Shortness Of Breath      Medication List    STOP taking these medications   spironolactone 25 MG tablet Commonly known as: ALDACTONE     TAKE these medications   apixaban 2.5 MG Tabs tablet Commonly known as: ELIQUIS Take 1 tablet (2.5 mg total) by mouth 2 (two) times daily. Hold until follow up with oncology/cardiology service as an outpatient. What changed: additional instructions   Combigan 0.2-0.5 % ophthalmic solution Generic drug: brimonidine-timolol Place 1 drop into both eyes 2 (two) times daily.   digoxin 0.125 MG tablet Commonly known as: LANOXIN Take 1 tablet (0.125 mg total) by mouth daily. Start taking on: January 18, 2021   docusate sodium 100 MG capsule Commonly known as: Colace Take 2 capsules (200 mg total) by mouth at bedtime.   finasteride 5 MG tablet Commonly known as: PROSCAR Take 5 mg by mouth daily.   levothyroxine 75 MCG tablet Commonly known as: SYNTHROID Take 75 mcg by mouth daily before breakfast.   lidocaine-prilocaine cream Commonly known as: EMLA Apply to port site 1-2 hours prior to use   LORazepam 1 MG tablet Commonly known as: ATIVAN Take 0.5 mg by mouth at bedtime as needed for sleep.   metoprolol tartrate 25 MG tablet Commonly known as: LOPRESSOR Take 0.5 tablets (12.5 mg total) by mouth 2 (two) times daily.   prochlorperazine 5 MG tablet Commonly known as: COMPAZINE Take 1 tablet (5 mg total) by mouth every 6 (six)  hours as needed for nausea or vomiting.   tamsulosin 0.4 MG Caps capsule Commonly known as: FLOMAX Take 1 capsule (0.4 mg total) by mouth at bedtime.   vitamin D3 50 MCG (2000 UT) Caps Take 2,000 Units by mouth daily.      Allergies  Allergen Reactions  . Amiodarone Other (See Comments)    Not good for liver  . Nicholson, [place] M, PA-C Follow up on 01/30/2021.   Specialties: Physician Assistant, Cardiology Why: Cardiology Hospital Follow-up on 01/30/2021 at 1:00 PM with Bernerd Pho, [place] (works with Dr. Claiborne Billings). Appointment will be in the [facility]  Contact information: [address] [place] [address] [phone]        Glenda Chroman, MD. Schedule an appointment as soon as possible for a visit in 2 week(s).   Specialty: Internal Medicine Contact information: [place] [address] [phone]        Troy Sine, MD .   Specialty: Cardiology Contact information: [address]  [address] [place] [address] [phone]               The results of significant diagnostics from this hospitalization (including imaging, microbiology, ancillary and laboratory) are listed below for reference.    Significant Diagnostic Studies: DG Chest 2 View  Result Date: 01/15/2021 CLINICAL DATA:  Chest pain EXAM: CHEST - 2 VIEW COMPARISON:  CT 03/06/2020 FINDINGS: Right Port-A-Cath in place with the tip at the cavoatrial junction. Heart is normal size. There is hyperinflation of the lungs compatible with COPD. Multiple nodular densities project over both lungs, the largest the 3 cm rounded area projecting over the left mid lung peripherally. No effusions. No acute bony abnormality. IMPRESSION: Nodular densities project over both lungs including 3 cm rounded mass in the left mid lung region. Recommend chest CT for further evaluation. COPD. Electronically Signed   By: Rolm Baptise  M.D.   On: 01/15/2021 15:08   DG Chest Port 1 View  Result Date: 01/16/2021 CLINICAL DATA:  CHF EXAM: PORTABLE CHEST 1 VIEW COMPARISON:  Yesterday FINDINGS: Mild cardiomegaly. Large lung volumes with diaphragm flattening. Hazy density at the bases suggesting atelectasis. Redemonstrated 2.8 cm nodule over the lateral left chest. Smaller nodules are also seen on the right. Porta catheter with tip at the SVC. IMPRESSION: Stable compared to yesterday.  No evident CHF. Pulmonary nodules. Electronically Signed   By: Monte Fantasia M.D.   On: 01/16/2021 05:36   ECHOCARDIOGRAM COMPLETE  Result Date: 01/17/2021    ECHOCARDIOGRAM REPORT   Patient Name:   Dylan York Date of Exam: 01/17/2021 Medical Rec #:  [id number]      Height:       68.0 in Accession #:    [id number]     Weight:       128.1 lb Date of Birth:  Oct 23, 1926     BSA:          1.691 m Patient Age:    57 years       BP:           113/83 mmHg Patient Gender: M              HR:           77 bpm. Exam Location:  [facility] Procedure: 2D Echo Indications:    Congestive Heart Failure I50.9  History:        Patient has prior history of Echocardiogram examinations, most                 recent 02/21/2020. Stroke, Arrythmias:Atrial Fibrillation; Risk                 Factors:Former Smoker, Dyslipidemia, Hypertension and Diabetes.                 Sick Sinus Syndrome.  Sonographer:    Leavy Cella RDCS (AE) Referring Phys: [id number] AS[place] B [place]  1. Left ventricular ejection fraction, by estimation, is 60 to 65%. The left ventricle has normal function. The left ventricle has no regional wall motion abnormalities. There is mild left ventricular hypertrophy. Left ventricular diastolic parameters are indeterminate.  2. Right ventricular systolic function is normal. The right ventricular size is moderately enlarged. There is mildly elevated pulmonary artery systolic pressure. The estimated right ventricular systolic pressure is 20.3 mmHg.  3. Left  atrial size was severely dilated.  4. Right atrial size was severely dilated.  5. The mitral valve is abnormal. Trivial mitral valve regurgitation. Moderate mitral  annular calcification.  6. Tricuspid valve regurgitation is moderate.  7. The aortic valve is tricuspid. There is moderate calcification of the aortic valve. Aortic valve regurgitation is trivial. Mild to moderate aortic valve sclerosis/calcification is present, without any evidence of aortic stenosis. Aortic regurgitation  PHT measures 714 msec.  8. The inferior vena cava is dilated in size with >50% respiratory variability, suggesting right atrial pressure of 8 mmHg. FINDINGS  Left Ventricle: Left ventricular ejection fraction, by estimation, is 60 to 65%. The left ventricle has normal function. The left ventricle has no regional wall motion abnormalities. The left ventricular internal cavity size was normal in size. There is  mild left ventricular hypertrophy. Left ventricular diastolic parameters are indeterminate. Right Ventricle: The right ventricular size is moderately enlarged. No increase in right ventricular wall thickness. Right ventricular systolic function is normal. There is mildly elevated pulmonary artery systolic pressure. The tricuspid regurgitant velocity is 2.87 m/s, and with an assumed right atrial pressure of 8 mmHg, the estimated right ventricular systolic pressure is 16.3 mmHg. Left Atrium: Left atrial size was severely dilated. Right Atrium: Right atrial size was severely dilated. Pericardium: There is no evidence of pericardial effusion. Mitral Valve: The mitral valve is abnormal. Moderate mitral annular calcification. Trivial mitral valve regurgitation. Tricuspid Valve: The tricuspid valve is grossly normal. Tricuspid valve regurgitation is moderate. Aortic Valve: The aortic valve is tricuspid. There is moderate calcification of the aortic valve. There is mild to moderate aortic valve annular calcification. Aortic valve  regurgitation is trivial. Aortic regurgitation PHT measures 714 msec. Mild to moderate aortic valve sclerosis/calcification is present, without any evidence of aortic stenosis. Pulmonic Valve: The pulmonic valve was grossly normal. Pulmonic valve regurgitation is mild. Aorta: The aortic root is normal in size and structure. Venous: The inferior vena cava is dilated in size with greater than 50% respiratory variability, suggesting right atrial pressure of 8 mmHg. IAS/Shunts: No atrial level shunt detected by color flow Doppler.  LEFT VENTRICLE PLAX 2D LVIDd:         4.09 cm  Diastology LVIDs:         2.32 cm  LV e' medial:    11.10 cm/s LV PW:         1.34 cm  LV E/e' medial:  5.9 LV IVS:        1.16 cm  LV e' lateral:   14.80 cm/s LVOT diam:     2.00 cm  LV E/e' lateral: 4.5 LVOT Area:     3.14 cm  RIGHT VENTRICLE RV S prime:     14.80 cm/s TAPSE (M-mode): 1.9 cm LEFT ATRIUM              Index       RIGHT ATRIUM           Index LA diam:        4.40 cm  2.60 cm/m  RA Area:     31.10 cm LA Vol (A2C):   95.4 ml  56.41 ml/m RA Volume:   102.00 ml 60.31 ml/m LA Vol (A4C):   107.0 ml 63.27 ml/m LA Biplane Vol: 104.0 ml 61.50 ml/m  AORTIC VALVE AI PHT:      714 msec  AORTA Ao Root diam: 3.40 cm MITRAL VALVE               TRICUSPID VALVE MV Area (PHT): 2.97 cm    TR Peak grad:   32.9 mmHg MV Decel Time: 255 msec  TR Vmax:        287.00 cm/s MV E velocity: 66.00 cm/s MV A velocity: 31.70 cm/s  SHUNTS MV E/A ratio:  2.08        Systemic Diam: 2.00 cm Rozann Lesches MD Electronically signed by Rozann Lesches MD Signature Date/Time: 01/17/2021/11:17:41 AM    Final    VAS [place] LOWER EXTREMITY VENOUS (DVT)  Result Date: 12/19/2020  Lower Venous DVT Study Indications: Edema.  Comparison Study: No prior studies. Performing Technologist: Oliver Hum RVT  Examination Guidelines: A complete evaluation includes B-mode imaging, spectral Doppler, color Doppler, and power Doppler as needed of all accessible portions of  each vessel. Bilateral testing is considered an integral part of a complete examination. Limited examinations for reoccurring indications may be performed as noted. The reflux portion of the exam is performed with the patient in reverse Trendelenburg.  +---------+---------------+---------+-----------+----------+--------------+ RIGHT    CompressibilityPhasicitySpontaneityPropertiesThrombus Aging +---------+---------------+---------+-----------+----------+--------------+ CFV      Full           Yes      Yes                                 +---------+---------------+---------+-----------+----------+--------------+ SFJ      Full                                                        +---------+---------------+---------+-----------+----------+--------------+ FV Prox  Full                                                        +---------+---------------+---------+-----------+----------+--------------+ FV Mid   Full                                                        +---------+---------------+---------+-----------+----------+--------------+ FV DistalFull                                                        +---------+---------------+---------+-----------+----------+--------------+ PFV      Full                                                        +---------+---------------+---------+-----------+----------+--------------+ POP      Full           Yes      Yes                                 +---------+---------------+---------+-----------+----------+--------------+ PTV      Full                                                        +---------+---------------+---------+-----------+----------+--------------+  PERO     Full                                                        +---------+---------------+---------+-----------+----------+--------------+   +---------+---------------+---------+-----------+----------+--------------+ LEFT      CompressibilityPhasicitySpontaneityPropertiesThrombus Aging +---------+---------------+---------+-----------+----------+--------------+ CFV      Full           Yes      Yes                                 +---------+---------------+---------+-----------+----------+--------------+ SFJ      Full                                                        +---------+---------------+---------+-----------+----------+--------------+ FV Prox  Full                                                        +---------+---------------+---------+-----------+----------+--------------+ FV Mid   Full                                                        +---------+---------------+---------+-----------+----------+--------------+ FV DistalFull                                                        +---------+---------------+---------+-----------+----------+--------------+ PFV      Full                                                        +---------+---------------+---------+-----------+----------+--------------+ POP      Full           Yes      Yes                                 +---------+---------------+---------+-----------+----------+--------------+ PTV      Full                                                        +---------+---------------+---------+-----------+----------+--------------+ PERO     Full                                                        +---------+---------------+---------+-----------+----------+--------------+       Summary: RIGHT: - There is no evidence of deep vein thrombosis in the lower extremity.  - A cystic structure, measuring 1.2cm high by 3.1cm wide by 3.9cm long, is found in the popliteal fossa.  LEFT: - There is no evidence of deep vein thrombosis in the lower extremity.  - A cystic structure, measuring 1.1cm high by 2.5cm wide by 4.6cm long, is found in the popliteal fossa.  *See table(s) above for measurements and observations.  Electronically signed by Harold Barban MD on 12/19/2020 at 9:45:01 PM.    Final    Microbiology: Recent Results (from the past 240 hour(s))  Resp Panel by RT-PCR (Flu A&B, Covid) Nasopharyngeal Swab     Status: None   Collection Time: 01/15/21  3:48 PM   Specimen: Nasopharyngeal Swab; Nasopharyngeal(NP) swabs in vial transport medium  Result Value Ref Range Status   SARS Coronavirus 2 by RT PCR NEGATIVE NEGATIVE Final    Comment: (NOTE) SARS-CoV-2 target nucleic acids are NOT DETECTED.  The SARS-CoV-2 RNA is generally detectable in upper respiratory specimens during the acute phase of infection. The lowest concentration of SARS-CoV-2 viral copies this assay can detect is 138 copies/mL. A negative result does not preclude SARS-Cov-2 infection and should not be used as the sole basis for treatment or other patient management decisions. A negative result may occur with  improper specimen collection/handling, submission of specimen other than nasopharyngeal swab, presence of viral mutation(s) within the areas targeted by this assay, and inadequate number of viral copies(<138 copies/mL). A negative result must be combined with clinical observations, patient history, and epidemiological information. The expected result is Negative.  Fact Sheet for Patients:  EntrepreneurPulse.com.au  Fact Sheet for Healthcare Providers:  IncredibleEmployment.be  This test is no t yet approved or cleared by the [place] FDA and  has been authorized for detection and/or diagnosis of SARS-CoV-2 by FDA under an Emergency Use Authorization (EUA). This EUA will remain  in effect (meaning this test can be used) for the duration of the COVID-19 declaration under Section 564(b)(1) of the Act, 21 U.S.C.section 360bbb-3(b)(1), unless the authorization is terminated  or revoked sooner.       Influenza A by PCR NEGATIVE NEGATIVE Final   Influenza B by PCR NEGATIVE  NEGATIVE Final    Comment: (NOTE) The Xpert Xpress SARS-CoV-2/FLU/RSV plus assay is intended as an aid in the diagnosis of influenza from Nasopharyngeal swab specimens and should not be used as a sole basis for treatment. Nasal washings and aspirates are unacceptable for Xpert Xpress SARS-CoV-2/FLU/RSV testing.  Fact Sheet for Patients: EntrepreneurPulse.com.au  Fact Sheet for Healthcare Providers: IncredibleEmployment.be  This test is not yet approved or cleared by the [place] FDA and has been authorized for detection and/or diagnosis of SARS-CoV-2 by FDA under an Emergency Use Authorization (EUA). This EUA will remain in effect (meaning this test can be used) for the duration of the COVID-19 declaration under Section 564(b)(1) of the Act, 21 U.S.C. section 360bbb-3(b)(1), unless the authorization is terminated or revoked.  Performed at [facility], [address] [place], [place] [address]      Labs: Basic Metabolic Panel: Recent Labs  Lab 01/15/21 1520 01/16/21 0553 01/16/21 1729 01/17/21 0530  NA 137  --  141 138  K 3.4*  --  3.3* 3.2*  CL 106  --  105 104  CO2 20*  --  24 25  GLUCOSE 99  --  103* 91  BUN 23  --  25* 24*  CREATININE 0.86  --  0.99 0.93  CALCIUM 8.4*  --  8.4* 8.4*  MG  --  1.8  --   --    CBC: Recent Labs  Lab 01/15/21 1520 01/16/21 0553 01/17/21 0530  WBC 11.8* 7.7 7.8  HGB 10.4* 10.0* 9.8*  HCT 33.3* 32.1* 30.6*  MCV 112.5* 111.1* 109.3*  PLT 32* 27* 28*   BNP (last 3 results) Recent Labs    01/15/21 1520  BNP 2,845.0*    Signed:  Barton Dubois MD.  [facility] 01/17/2021, 1:40 PM

## 2021-01-17 NOTE — TOC Transition Note (Signed)
Transition of Care [facility]) - CM/SW Discharge Note   Patient Details  Name: Dylan York MRN: [id number] Date of Birth: Aug 14, 1926  [facility] CM/SW Contact:  Natasha Bence, LCSW Phone Number: 01/17/2021, 1:40 PM   Clinical Narrative:    CSW notified of patient's readiness for discharge. CSW observed high readmission risk score and conducted risk assessment. CSW also received CHF consult. CSW conducted CHF consult and initial assessment. Patient reported that he follows a heart healthy diet, weighs him self daily, and monitors fluid intake. Patient reported that his wife currently receive hospice services. Patient requested [place] services to assist him with mobility in the home because he is the primary caregiver for his wife. Patient reported that they may need to be referred to an ALF, but would like to further discuss the options. CSW referred [facility] to [facility]. [facility] agreeable to take patient. TOC signing off.    Final next level of care: [place] Barriers to Discharge: Barriers Resolved   Patient Goals and CMS Choice Patient states their goals for this hospitalization and ongoing recovery are:: Return home with [facility] CMS Medicare.gov Compare Post Acute Care list provided to:: Patient Choice offered to / list presented to : Patient  Discharge Placement                    Patient and family notified of of transfer: 01/17/21  Discharge Plan and Services                DME Arranged: N/A DME Agency: NA       HH Arranged: RN,PT [place] Agency: [place] ([place]) Date [place]: 01/17/21 Time [place]: [id number] Representative spoke with at [place]: [place] ([place]) Interventions     Readmission Risk Interventions Readmission Risk Prevention Plan 01/17/2021  Transportation Screening Complete  PCP or Specialist Appt within 3-5 Days Complete  HRI or [place] Complete   Social Work Consult for [place] Planning/Counseling Complete  Palliative Care Screening Not Applicable  Medication Review Press photographer) Complete  Some recent data might be hidden

## 2021-01-17 NOTE — Progress Notes (Addendum)
Progress Note  Patient Name: Dylan York Date of Encounter: 01/17/2021  Primary Cardiologist: Shelva Majestic, MD   Subjective   Frequent urination overnight. Breathing improved. No chest pain or persistent palpitations this AM. Dizziness improved as well.    Inpatient Medications    Scheduled Meds: . apixaban  2.5 mg Oral BID  . brimonidine  1 drop Both Eyes BID   And  . timolol  1 drop Both Eyes BID  . Chlorhexidine Gluconate Cloth  6 each Topical Daily  . digoxin  0.125 mg Oral Daily  . finasteride  5 mg Oral Daily  . furosemide  40 mg Intravenous BID  . levothyroxine  75 mcg Oral Q0600  . metoprolol tartrate  12.5 mg Oral BID  . potassium chloride  40 mEq Oral Daily  . tamsulosin  0.4 mg Oral QHS    PRN Meds: acetaminophen **OR** acetaminophen, LORazepam, metoprolol tartrate, morphine injection, ondansetron **OR** ondansetron (ZOFRAN) IV, oxyCODONE, polyethylene glycol, prochlorperazine   Vital Signs    Vitals:   01/17/21 0448 01/17/21 0614 01/17/21 0626 01/17/21 0830  BP:  112/85 113/83   Pulse:  90 77 91  Resp:  18 20   Temp:  (!) 97.5 F (36.4 C) 97.8 F (36.6 C)   TempSrc:   Oral   SpO2:  91% 97%   Weight: 58.1 kg     Height:        Intake/Output Summary (Last 24 hours) at 01/17/2021 1114 Last data filed at 01/17/2021 0846 Gross per 24 hour  Intake 960 ml  Output 3420 ml  Net -2460 ml    Last 3 Weights 01/17/2021 01/15/2021 01/09/2021  Weight (lbs) 128 lb 1.4 oz 131 lb 6.4 oz 134 lb 6.4 oz  Weight (kg) 58.1 kg 59.603 kg 60.963 kg      Telemetry    Atrial fibrillation, HR in 80's to low-100's with occasional episodes into the 130's. - Personally Reviewed  ECG    No new tracings.   Physical Exam   General: Well developed, well nourished, male appearing in no acute distress. Head: Normocephalic, atraumatic.  Neck: Supple without bruits, JVD not elevated. Lungs:  Resp regular and unlabored, CTA without wheezing or rales. Heart: Irregularly  irregular, S1, S2, no S3, S4, 2/6 SEM alonf RUSB.  Abdomen: Soft, non-tender, non-distended with normoactive bowel sounds. No hepatomegaly. No rebound/guarding. No obvious abdominal masses. Extremities: No clubbing or cyanosis, trace lower extremity edema. Distal pedal pulses are 2+ bilaterally. Neuro: Alert and oriented X 3. Moves all extremities spontaneously. Psych: Normal affect.  Labs    Chemistry Recent Labs  Lab 01/15/21 1520 01/16/21 1729 01/17/21 0530  NA 137 141 138  K 3.4* 3.3* 3.2*  CL 106 105 104  CO2 20* 24 25  GLUCOSE 99 103* 91  BUN 23 25* 24*  CREATININE 0.86 0.99 0.93  CALCIUM 8.4* 8.4* 8.4*  GFRNONAA >60 >60 >60  ANIONGAP 11 12 9      Hematology Recent Labs  Lab 01/15/21 1520 01/16/21 0553 01/17/21 0530  WBC 11.8* 7.7 7.8  RBC 2.96* 2.89* 2.80*  HGB 10.4* 10.0* 9.8*  HCT 33.3* 32.1* 30.6*  MCV 112.5* 111.1* 109.3*  MCH 35.1* 34.6* 35.0*  MCHC 31.2 31.2 32.0  RDW 19.0* 19.1* 18.6*  PLT 32* 27* 28*    BNP Recent Labs  Lab 01/15/21 1520  BNP 2,845.0*     Radiology    DG Chest 2 View  Result Date: 01/15/2021 CLINICAL DATA:  Chest pain EXAM:  CHEST - 2 VIEW COMPARISON:  CT 03/06/2020 FINDINGS: Right Port-A-Cath in place with the tip at the cavoatrial junction. Heart is normal size. There is hyperinflation of the lungs compatible with COPD. Multiple nodular densities project over both lungs, the largest the 3 cm rounded area projecting over the left mid lung peripherally. No effusions. No acute bony abnormality. IMPRESSION: Nodular densities project over both lungs including 3 cm rounded mass in the left mid lung region. Recommend chest CT for further evaluation. COPD. Electronically Signed   By: Rolm Baptise M.D.   On: 01/15/2021 15:08   DG Chest Port 1 View  Result Date: 01/16/2021 CLINICAL DATA:  CHF EXAM: PORTABLE CHEST 1 VIEW COMPARISON:  Yesterday FINDINGS: Mild cardiomegaly. Large lung volumes with diaphragm flattening. Hazy density at the  bases suggesting atelectasis. Redemonstrated 2.8 cm nodule over the lateral left chest. Smaller nodules are also seen on the right. Porta catheter with tip at the SVC. IMPRESSION: Stable compared to yesterday.  No evident CHF. Pulmonary nodules. Electronically Signed   By: Monte Fantasia M.D.   On: 01/16/2021 05:36    Cardiac Studies   Echocardiogram: 01/17/2021 IMPRESSIONS   1. Left ventricular ejection fraction, by estimation, is 60 to 65%. The  left ventricle has normal function. The left ventricle has no regional  wall motion abnormalities. There is mild left ventricular hypertrophy.  Left ventricular diastolic parameters  are indeterminate.  2. Right ventricular systolic function is normal. The right ventricular  size is moderately enlarged. There is mildly elevated pulmonary artery  systolic pressure. The estimated right ventricular systolic pressure is  62.7 mmHg.  3. Left atrial size was severely dilated.  4. Right atrial size was severely dilated.  5. The mitral valve is abnormal. Trivial mitral valve regurgitation.  Moderate mitral annular calcification.  6. Tricuspid valve regurgitation is moderate.  7. The aortic valve is tricuspid. There is moderate calcification of the  aortic valve. Aortic valve regurgitation is trivial. Mild to moderate  aortic valve sclerosis/calcification is present, without any evidence of  aortic stenosis. Aortic regurgitation  PHT measures 714 msec.  8. The inferior vena cava is dilated in size with >50% respiratory  variability, suggesting right atrial pressure of 8 mmHg.   Patient Profile     87 y.o. male with past medical history of permanent atrial fibrillation, chronic combined systolic and diastolic CHF (EF 03-50% by echo in 02/2020), HTN, history of CVA, hypothyroidism, anemia, colon cancer (s/p partial colectomy in 04/2020, recurrent metastatic disease to the liver by imaging in 09/2020 and currently undergoing chemotherapy) who  is currently admitted for atrial fibrillation with RVR complicated by hypotension.   Assessment & Plan    1. Atrial Fibrillation with RVR - He has known permanent atrial fibrillation but rates were elevated on admission as he had been holding his PTA Toprol-XL due to hypotension.  -  BP has remained soft but overall improved since admission. He was on Toprol-XL 50mg  daily prior to admission and this has been transitioned to Lopressor 12.5mg  BID. Digoxin also added at 0.125mg  daily and rates have improved into the 80's to low-100's with elevation at times with activity. Would continue current dosing for now. If he experiences recurrences, may need to consider the use of Amiodarone for rate-control alone in the future but have held off for now given prior elevated LFT's with this (occurring in 2019) and due to his known liver mets.  - His CHA2DS2-VASc Score is 7 and he was on Eliquis 2.5mg   BID prior to admission as dosing was previously reduced by Oncology given his thrombocytopenia. Was initially held due to thrombocytopenia but has since been resumed and previously recommended by Dr. Harrington Challenger to continue. Will review further with Dr. Domenic Polite in regards to continuing for now vs. holding until the patient follows with Oncology (appt and next treatment scheduled for 3/24).   2. Acute Diastolic CHF Exacerbation - Presented with worsening lower extremity edema and orthopnea with BNP elevated to 2845. His EF was previously reduced to 45-50% by echo in 02/2020 and has now normalized to 60-65%. Did have trivial MR and moderate TR.  - He has been receiving IV Lasix 40mg  BID with a recorded output of -1.9 L thus far. Creatinine stable at 0.93 and K+ low at 3.2 with replacement already ordered. - Would anticipate discharging on a lower dose such as 20mg  daily as he was not on Lasix prior to admission. Would need a repeat BMET at follow-up and may be able to reduce to PRN dosing pending reassessment of his volume  status.   3. Metastatic Colon Cancer - He is s/p partial colectomy in 04/2020 with recurrent metastatic disease to the liver by imaging in 09/2020 and currently undergoing chemotherapy.  - Followed by Oncology as an outpatient.   4. Thrombocytopenia - Platelets down to 27K this admission, slightly improved to 28K this AM. Followed by Oncology as an outpatient and several of his treatments have been postponed secondary to this.  - Further management per admitting team.   Will arrange for follow-up with Dr. Claiborne Billings or APP (patient prefers for appt to be in [place] if with an APP).    For questions or updates, please contact [place] Please consult [email or web address] for contact info under Cardiology/STEMI.   Signed, Erma Heritage , PA-C 11:14 AM 01/17/2021 Pager: [phone]   Attending note:  Patient seen and examined.  Case also discussed with Ms. Ahmed Prima PA-C, I agree with her above findings.  Mr. Defina does not report any significant sense of palpitations with improved dizziness and breathing status.  Heart rate control of atrial fibrillation is reasonable at this time, he is now on Lopressor 12.5 mg twice daily and Lanoxin 0.125 mg daily.  Follow-up echocardiogram shows relative improvement in LVEF, now in the range of 60 to 65%.  He does have moderately elevated RVSP with severe biatrial enlargement and aortic valve sclerosis with trivial aortic regurgitation.  He has improved in terms of overall fluid status and acute on chronic diastolic heart failure.  Follow-up lab work shows platelet count 28, hemoglobin 9.8, creatinine 0.9.  Would recommend continuing current doses of Lopressor and Lanoxin.  Suggest holding Eliquis at this time to reduce bleeding risk given significant thrombocytopenia with platelet count in the 20s until follow-up with oncology (presumably related to chemotherapy).  We will sign off and arrange outpatient follow-up.  Satira Sark, M.D.,  F.A.C.C.

## 2021-01-17 NOTE — Progress Notes (Signed)
   01/17/21 0600  Provider Notification  Provider Name/Title Olevia Bowens  Date Provider Notified 01/17/21  Time Provider Notified [phone]  Notification Type  (Secure chat sent)  Notification Reason Critical result  Test performed and critical result  (Platelet 28)  Date Critical Result Received 01/17/21  Time Critical Result Received 0603

## 2021-01-17 NOTE — Progress Notes (Signed)
Patient ambulated from bed to sink, while ambulating patients HR sustained between 103-112 bpm.

## 2021-01-17 NOTE — Progress Notes (Signed)
*  PRELIMINARY RESULTS* Echocardiogram 2D Echocardiogram has been performed.  Dylan York 01/17/2021, 10:55 AM

## 2021-01-17 NOTE — Plan of Care (Signed)

## 2021-01-17 NOTE — Progress Notes (Signed)
Patient discharged home with discharge instructions, follow up appointments and medication instructions., sat at bedside with patient and went over the new medications and when he could take his current medication. IV removed from RFA. Patient transported by w/c to family member vehicle

## 2021-01-18 DIAGNOSIS — C787 Secondary malignant neoplasm of liver and intrahepatic bile duct: Secondary | ICD-10-CM | POA: Diagnosis not present

## 2021-01-18 DIAGNOSIS — I5032 Chronic diastolic (congestive) heart failure: Secondary | ICD-10-CM | POA: Diagnosis not present

## 2021-01-18 DIAGNOSIS — D630 Anemia in neoplastic disease: Secondary | ICD-10-CM | POA: Diagnosis not present

## 2021-01-18 DIAGNOSIS — C189 Malignant neoplasm of colon, unspecified: Secondary | ICD-10-CM | POA: Diagnosis not present

## 2021-01-18 DIAGNOSIS — Z8673 Personal history of transient ischemic attack (TIA), and cerebral infarction without residual deficits: Secondary | ICD-10-CM | POA: Diagnosis not present

## 2021-01-18 DIAGNOSIS — I071 Rheumatic tricuspid insufficiency: Secondary | ICD-10-CM | POA: Diagnosis not present

## 2021-01-18 DIAGNOSIS — N400 Enlarged prostate without lower urinary tract symptoms: Secondary | ICD-10-CM | POA: Diagnosis not present

## 2021-01-18 DIAGNOSIS — I495 Sick sinus syndrome: Secondary | ICD-10-CM | POA: Diagnosis not present

## 2021-01-18 DIAGNOSIS — E039 Hypothyroidism, unspecified: Secondary | ICD-10-CM | POA: Diagnosis not present

## 2021-01-18 DIAGNOSIS — I110 Hypertensive heart disease with heart failure: Secondary | ICD-10-CM | POA: Diagnosis not present

## 2021-01-18 DIAGNOSIS — Z87891 Personal history of nicotine dependence: Secondary | ICD-10-CM | POA: Diagnosis not present

## 2021-01-18 DIAGNOSIS — D696 Thrombocytopenia, unspecified: Secondary | ICD-10-CM | POA: Diagnosis not present

## 2021-01-18 DIAGNOSIS — I4891 Unspecified atrial fibrillation: Secondary | ICD-10-CM | POA: Diagnosis not present

## 2021-01-18 DIAGNOSIS — H409 Unspecified glaucoma: Secondary | ICD-10-CM | POA: Diagnosis not present

## 2021-01-18 DIAGNOSIS — F419 Anxiety disorder, unspecified: Secondary | ICD-10-CM | POA: Diagnosis not present

## 2021-01-18 DIAGNOSIS — E876 Hypokalemia: Secondary | ICD-10-CM | POA: Diagnosis not present

## 2021-01-18 DIAGNOSIS — I959 Hypotension, unspecified: Secondary | ICD-10-CM | POA: Diagnosis not present

## 2021-01-20 ENCOUNTER — Other Ambulatory Visit: Payer: Self-pay | Admitting: Oncology

## 2021-01-21 ENCOUNTER — Telehealth: Payer: Self-pay | Admitting: *Deleted

## 2021-01-21 DIAGNOSIS — C189 Malignant neoplasm of colon, unspecified: Secondary | ICD-10-CM | POA: Diagnosis not present

## 2021-01-21 DIAGNOSIS — I959 Hypotension, unspecified: Secondary | ICD-10-CM | POA: Diagnosis not present

## 2021-01-21 DIAGNOSIS — I110 Hypertensive heart disease with heart failure: Secondary | ICD-10-CM | POA: Diagnosis not present

## 2021-01-21 DIAGNOSIS — I4891 Unspecified atrial fibrillation: Secondary | ICD-10-CM | POA: Diagnosis not present

## 2021-01-21 DIAGNOSIS — I5032 Chronic diastolic (congestive) heart failure: Secondary | ICD-10-CM | POA: Diagnosis not present

## 2021-01-21 DIAGNOSIS — C787 Secondary malignant neoplasm of liver and intrahepatic bile duct: Secondary | ICD-10-CM | POA: Diagnosis not present

## 2021-01-21 NOTE — Telephone Encounter (Signed)
Patient called to inquire if MD thinks it is still advisable for him to have his chemo this week at 01/24/21? Recent admission for A-fib w/tachycardia and hypotension. Reports feeling ok. Reports BP this weekend run 95/64, 98/64; with pulse in 70's. Sees cardiology on 01/30/21

## 2021-01-21 NOTE — Telephone Encounter (Signed)
Per Dr. Benay Spice: Yes, proceed w/cycle #6 on 3/24 as planned. Sent lab orders to [facility] lab to be drawn on 01/23/21. Patient notified and instructed to go in the am for labs to ensure results will be seen by 01/24/21

## 2021-01-22 DIAGNOSIS — I4891 Unspecified atrial fibrillation: Secondary | ICD-10-CM | POA: Diagnosis not present

## 2021-01-22 DIAGNOSIS — I5032 Chronic diastolic (congestive) heart failure: Secondary | ICD-10-CM | POA: Diagnosis not present

## 2021-01-22 DIAGNOSIS — I959 Hypotension, unspecified: Secondary | ICD-10-CM | POA: Diagnosis not present

## 2021-01-22 DIAGNOSIS — C189 Malignant neoplasm of colon, unspecified: Secondary | ICD-10-CM | POA: Diagnosis not present

## 2021-01-22 DIAGNOSIS — I110 Hypertensive heart disease with heart failure: Secondary | ICD-10-CM | POA: Diagnosis not present

## 2021-01-22 DIAGNOSIS — C787 Secondary malignant neoplasm of liver and intrahepatic bile duct: Secondary | ICD-10-CM | POA: Diagnosis not present

## 2021-01-23 ENCOUNTER — Other Ambulatory Visit: Payer: Self-pay

## 2021-01-23 ENCOUNTER — Other Ambulatory Visit (HOSPITAL_COMMUNITY): Payer: Self-pay | Admitting: *Deleted

## 2021-01-23 ENCOUNTER — Other Ambulatory Visit (HOSPITAL_COMMUNITY)
Admission: RE | Admit: 2021-01-23 | Discharge: 2021-01-23 | Disposition: A | Discharge status: Home / Self Care | Payer: Medicare Other | Source: Ambulatory Visit | Attending: Oncology | Admitting: Oncology

## 2021-01-23 DIAGNOSIS — I959 Hypotension, unspecified: Secondary | ICD-10-CM | POA: Diagnosis not present

## 2021-01-23 DIAGNOSIS — I4891 Unspecified atrial fibrillation: Secondary | ICD-10-CM | POA: Diagnosis not present

## 2021-01-23 DIAGNOSIS — C189 Malignant neoplasm of colon, unspecified: Secondary | ICD-10-CM | POA: Insufficient documentation

## 2021-01-23 DIAGNOSIS — C787 Secondary malignant neoplasm of liver and intrahepatic bile duct: Secondary | ICD-10-CM | POA: Insufficient documentation

## 2021-01-23 DIAGNOSIS — I5032 Chronic diastolic (congestive) heart failure: Secondary | ICD-10-CM | POA: Diagnosis not present

## 2021-01-23 DIAGNOSIS — I110 Hypertensive heart disease with heart failure: Secondary | ICD-10-CM | POA: Diagnosis not present

## 2021-01-23 LAB — CMP (CANCER CENTER ONLY)
ALT: 19 U/L (ref 0–44)
AST: 44 U/L — ABNORMAL HIGH (ref 15–41)
Albumin: 3.2 g/dL — ABNORMAL LOW (ref 3.5–5.0)
Alkaline Phosphatase: 128 U/L — ABNORMAL HIGH (ref 38–126)
Anion gap: 8 (ref 5–15)
BUN: 20 mg/dL (ref 8–23)
CO2: 25 mmol/L (ref 22–32)
Calcium: 9 mg/dL (ref 8.9–10.3)
Chloride: 108 mmol/L (ref 98–111)
Creatinine: 0.95 mg/dL (ref 0.61–1.24)
GFR, Estimated: >60 mL/min (ref >60)
Glucose, Bld: 93 mg/dL (ref 70–99)
Potassium: 3.6 mmol/L (ref 3.5–5.1)
Sodium: 141 mmol/L (ref 135–145)
Total Bilirubin: 1.8 mg/dL — ABNORMAL HIGH (ref 0.3–1.2)
Total Protein: 6 g/dL — ABNORMAL LOW (ref 6.5–8.1)

## 2021-01-23 LAB — CBC WITH DIFFERENTIAL/PLATELET
Abs Immature Granulocytes: 0.17 10*3/uL — ABNORMAL HIGH (ref 0.00–0.07)
Basophils Absolute: 0.1 10*3/uL (ref 0.0–0.1)
Basophils Relative: 1 %
Eosinophils Absolute: 0.1 10*3/uL (ref 0.0–0.5)
Eosinophils Relative: 1 %
HCT: 36.1 % — ABNORMAL LOW (ref 39.0–52.0)
Hemoglobin: 11.2 g/dL — ABNORMAL LOW (ref 13.0–17.0)
Immature Granulocytes: 2 %
Lymphocytes Relative: 12 %
Lymphs Abs: 1.1 10*3/uL (ref 0.7–4.0)
MCH: 34.7 pg — ABNORMAL HIGH (ref 26.0–34.0)
MCHC: 31 g/dL (ref 30.0–36.0)
MCV: 111.8 fL — ABNORMAL HIGH (ref 80.0–100.0)
Monocytes Absolute: 0.7 10*3/uL (ref 0.1–1.0)
Monocytes Relative: 9 %
Neutro Abs: 6.5 10*3/uL (ref 1.7–7.7)
Neutrophils Relative %: 75 %
Platelets: 64 10*3/uL — ABNORMAL LOW (ref 150–400)
RBC: 3.23 MIL/uL — ABNORMAL LOW (ref 4.22–5.81)
RDW: 18.1 % — ABNORMAL HIGH (ref 11.5–15.5)
WBC: 8.5 10*3/uL (ref 4.0–10.5)
nRBC: 0 % (ref 0.0–0.2)

## 2021-01-23 MED FILL — Dexamethasone Sodium Phosphate Inj 100 MG/10ML: INTRAMUSCULAR | Qty: 1 | Status: AC

## 2021-01-24 ENCOUNTER — Inpatient Hospital Stay (HOSPITAL_BASED_OUTPATIENT_CLINIC_OR_DEPARTMENT_OTHER): Payer: Medicare Other | Admitting: Nurse Practitioner

## 2021-01-24 ENCOUNTER — Other Ambulatory Visit: Payer: Self-pay

## 2021-01-24 ENCOUNTER — Inpatient Hospital Stay: Payer: Medicare Other

## 2021-01-24 ENCOUNTER — Encounter: Payer: Self-pay | Admitting: Nurse Practitioner

## 2021-01-24 VITALS — BP 141/84 | HR 78 | Temp 98.1°F | Resp 20 | Ht 68.0 in | Wt 124.9 lb

## 2021-01-24 DIAGNOSIS — E039 Hypothyroidism, unspecified: Secondary | ICD-10-CM | POA: Diagnosis not present

## 2021-01-24 DIAGNOSIS — C787 Secondary malignant neoplasm of liver and intrahepatic bile duct: Secondary | ICD-10-CM

## 2021-01-24 DIAGNOSIS — Z7901 Long term (current) use of anticoagulants: Secondary | ICD-10-CM | POA: Diagnosis not present

## 2021-01-24 DIAGNOSIS — Z79899 Other long term (current) drug therapy: Secondary | ICD-10-CM | POA: Diagnosis not present

## 2021-01-24 DIAGNOSIS — Z5189 Encounter for other specified aftercare: Secondary | ICD-10-CM | POA: Diagnosis not present

## 2021-01-24 DIAGNOSIS — Z5111 Encounter for antineoplastic chemotherapy: Secondary | ICD-10-CM | POA: Diagnosis not present

## 2021-01-24 DIAGNOSIS — C184 Malignant neoplasm of transverse colon: Secondary | ICD-10-CM | POA: Diagnosis not present

## 2021-01-24 DIAGNOSIS — I4891 Unspecified atrial fibrillation: Secondary | ICD-10-CM | POA: Diagnosis not present

## 2021-01-24 DIAGNOSIS — C189 Malignant neoplasm of colon, unspecified: Secondary | ICD-10-CM | POA: Diagnosis not present

## 2021-01-24 DIAGNOSIS — D6959 Other secondary thrombocytopenia: Secondary | ICD-10-CM | POA: Diagnosis not present

## 2021-01-24 DIAGNOSIS — I10 Essential (primary) hypertension: Secondary | ICD-10-CM | POA: Diagnosis not present

## 2021-01-24 DIAGNOSIS — T451X5A Adverse effect of antineoplastic and immunosuppressive drugs, initial encounter: Secondary | ICD-10-CM | POA: Diagnosis not present

## 2021-01-24 DIAGNOSIS — D701 Agranulocytosis secondary to cancer chemotherapy: Secondary | ICD-10-CM | POA: Diagnosis not present

## 2021-01-24 MED ORDER — SODIUM CHLORIDE 0.9 % IV SOLN
2000.0000 mg/m2 | INTRAVENOUS | Status: DC
  Administered 2021-01-24: 3400 mg via INTRAVENOUS
  Filled 2021-01-24: qty 68

## 2021-01-24 NOTE — Progress Notes (Addendum)
[place] OFFICE PROGRESS NOTE   Diagnosis: Colon cancer  INTERVAL HISTORY:   Mr. Carew returns as scheduled.  He completed cycle 5 FOLFOX 01/09/2021.  He was admitted with atrial fibrillation 01/15/2021-01/17/2021.  He reports he tolerated the chemotherapy well.  No nausea/vomiting.  No mouth sores.  No diarrhea.  No numbness or tingling in the hands or feet.  He notes bruising at the forearms.  He denies other bleeding.  He did not have CT scans as ordered prior to today's visit.  Objective:  Vital signs in last 24 hours:  Blood pressure (!) 141/84, pulse 78, temperature 98.1 F (36.7 C), temperature source Tympanic, resp. rate 20, height $RemoveBe'5\' 8"'qJSXjBtOJ$  (1.727 m), weight 124 lb 14.4 oz (56.7 kg), SpO2 99 %.    HEENT: No thrush or ulcers.  No blood within the oral cavity. Resp: Lungs clear bilaterally. Cardio: Irregular, rate within normal limits. GI: Liver palpable right upper abdomen, associated tenderness. Vascular: 1+ pitting edema lower leg bilaterally.  Skin: Large ecchymosis right forearm, smaller ecchymosis left antecubital region. Port-A-Cath without erythema.  Lab Results:  Lab Results  Component Value Date   WBC 8.5 01/23/2021   HGB 11.2 (L) 01/23/2021   HCT 36.1 (L) 01/23/2021   MCV 111.8 (H) 01/23/2021   PLT 64 (L) 01/23/2021   NEUTROABS 6.5 01/23/2021    Imaging:  No results found.  Medications: I have reviewed the patient's current medications.  Assessment/Plan: 1. Colon cancer-transverse, stage IIIb (T3N1), status post a segmental colectomy 04/13/2020 ? 1/10 lymph nodes, lymphovascular invasion present, MSS, resection margins negative for invasive cancer, "closest" margin positive for low-grade dysplasia; microsatellite stable, tumor mutation burden 3, KRAS G12D ? Colonoscopy 02/09/2020-ulcerated nonobstructing mass at the splenic flexure, 4 mm polyp of the rectum, splenic flexure lesion-adenocarcinoma with intact mismatch repair protein expression,  tubular adenoma at the rectal polyp ? CT abdomen/pelvis 02/16/2020-apple core lesion at the distal transverse colon, no evidence of metastatic disease ? CT chest 03/06/2020-no evidence of metastatic disease, stable benign lung nodules ? CT abdomen/pelvis 09/20/2020-interval development of widespread metastatic disease to the liver with multiple hypervascular hepatic masses, largest measuring 8.6 x 6.2 x 6.2 cm. Enlarged gastrohepatic ligament lymph node. ? Cycle 1 FOLFOX 10/11/2020 ? Cycle 2 FOLFOX 11/10/2020, Udenyca ? Cycle 3 FOLFOX 11/26/2020, oxaliplatin dose decreased secondary to thrombocytopenia ? Cycle 4 FOLFOX2/14/2022,postponedand oxaliplatin dose decreased secondary to progressivethrombocytopenia  ? Chemotherapy held due to thrombocytopenia 01/01/2021 ? Cycle 5 FOLFOX 01/09/2021-oxaliplatin dose reduced secondary to thrombocytopenia ? Cycle 6 FOLFOX 01/24/2021-oxaliplatin held due to thrombocytopenia 2. Rectal bleeding secondary to #1 3. Anemia-likely secondary to GI bleeding 4. Atrial fibrillation, maintained on apixaban-anticoagulation on hold 5. BPH 6. Glaucoma 7. Hypertension 8. Hypothyroid 9. History of a CVA 10. Positive resection margin for low-grade dysplasia on the colectomy specimen 04/13/2020 11. Neutropenia following cycle 1 FOLFOX, Udenyca added with cycle 2 12. Thrombocytopenia, secondary to chemotherapy 13. Lower extremity venous Doppler 12/19/2020-no evidence of DVT right lower extremity, left lower extremity. 14. Admission with atrial fibrillation 01/15/2021   Disposition: Mr. Zentner appears unchanged.  He has completed 5 cycles of FOLFOX.  He was referred to have CT scans done prior to today's visit.  Unfortunately the scans were not completed.  Plan to proceed with cycle 6 FOLFOX today as scheduled.  Due to thrombocytopenia oxaliplatin will be held.  He will receive 5-FU alone.  Plan for CT scans prior to next office visit.  We reviewed the CBC from today.  Counts are  adequate to proceed with treatment.  As noted above oxaliplatin will be held due to thrombocytopenia.  He will return for lab, follow-up, FOLFOX in 2 weeks.  He will contact the office in the interim with any problems.  Patient seen with Dr. Benay Spice.    Ned Card ANP/GNP-BC   01/24/2021  10:14 AM This was a shared visit with Ned Card Mr. Ching was admitted with atrial fibrillation following the last cycle of chemotherapy.  He developed severe thrombocytopenia during that hospital admission.  The platelet count has recovered partially.  Oxaliplatin will be held with chemotherapy today.  He will undergo a restaging CT prior to an office visit in 2 weeks.  I was present for greater than 50% of today's visit.  I performed medical decision making.  Julieanne Manson, MD

## 2021-01-24 NOTE — Patient Instructions (Signed)
[place] Discharge Instructions for Patients Receiving Chemotherapy  Today you received the following chemotherapy agents Flourouracil (ADRUCIL).  To help prevent nausea and vomiting after your treatment, we encourage you to take your nausea medication as prescribed.   If you develop nausea and vomiting that is not controlled by your nausea medication, call the clinic.   BELOW ARE SYMPTOMS THAT SHOULD BE REPORTED IMMEDIATELY:  *FEVER GREATER THAN 100.5 F  *CHILLS WITH OR WITHOUT FEVER  NAUSEA AND VOMITING THAT IS NOT CONTROLLED WITH YOUR NAUSEA MEDICATION  *UNUSUAL SHORTNESS OF BREATH  *UNUSUAL BRUISING OR BLEEDING  TENDERNESS IN MOUTH AND THROAT WITH OR WITHOUT PRESENCE OF ULCERS  *URINARY PROBLEMS  *BOWEL PROBLEMS  UNUSUAL RASH Items with * indicate a potential emergency and should be followed up as soon as possible.  Feel free to call the clinic should you have any questions or concerns. The clinic phone number is [phone].  Please show the [place] at check-in to the Emergency Department and triage nurse.

## 2021-01-25 ENCOUNTER — Telehealth: Payer: Self-pay | Admitting: Student

## 2021-01-25 DIAGNOSIS — I5032 Chronic diastolic (congestive) heart failure: Secondary | ICD-10-CM | POA: Diagnosis not present

## 2021-01-25 DIAGNOSIS — C787 Secondary malignant neoplasm of liver and intrahepatic bile duct: Secondary | ICD-10-CM | POA: Diagnosis not present

## 2021-01-25 DIAGNOSIS — I110 Hypertensive heart disease with heart failure: Secondary | ICD-10-CM | POA: Diagnosis not present

## 2021-01-25 DIAGNOSIS — Z09 Encounter for follow-up examination after completed treatment for conditions other than malignant neoplasm: Secondary | ICD-10-CM | POA: Diagnosis not present

## 2021-01-25 DIAGNOSIS — D696 Thrombocytopenia, unspecified: Secondary | ICD-10-CM | POA: Diagnosis not present

## 2021-01-25 DIAGNOSIS — C189 Malignant neoplasm of colon, unspecified: Secondary | ICD-10-CM | POA: Diagnosis not present

## 2021-01-25 DIAGNOSIS — I5022 Chronic systolic (congestive) heart failure: Secondary | ICD-10-CM | POA: Diagnosis not present

## 2021-01-25 DIAGNOSIS — Z299 Encounter for prophylactic measures, unspecified: Secondary | ICD-10-CM | POA: Diagnosis not present

## 2021-01-25 DIAGNOSIS — Z789 Other specified health status: Secondary | ICD-10-CM | POA: Diagnosis not present

## 2021-01-25 DIAGNOSIS — I4891 Unspecified atrial fibrillation: Secondary | ICD-10-CM | POA: Diagnosis not present

## 2021-01-25 DIAGNOSIS — I959 Hypotension, unspecified: Secondary | ICD-10-CM | POA: Diagnosis not present

## 2021-01-25 NOTE — Telephone Encounter (Signed)
When Dylan York went into hospital they stopped his Eliquis, when should he resume taking it?  Can they resume it today ?

## 2021-01-25 NOTE — Telephone Encounter (Signed)
I spoke with B.Strader, PA-C and she states oncology should let patient now when platelet numbers are safe to resume eliquis.

## 2021-01-25 NOTE — Telephone Encounter (Addendum)
Pt discharge summary, eliquis held due to issue with spontaneous bleeding.(platelets 64 k)  Patient has an apt on 01/30/21 in cardiology. It will be addressed then.

## 2021-01-26 ENCOUNTER — Other Ambulatory Visit: Payer: Self-pay

## 2021-01-26 ENCOUNTER — Inpatient Hospital Stay: Payer: Medicare Other

## 2021-01-26 VITALS — BP 127/96 | HR 63 | Temp 96.6°F | Resp 16

## 2021-01-26 DIAGNOSIS — Z5111 Encounter for antineoplastic chemotherapy: Secondary | ICD-10-CM | POA: Diagnosis not present

## 2021-01-26 DIAGNOSIS — C189 Malignant neoplasm of colon, unspecified: Secondary | ICD-10-CM

## 2021-01-26 DIAGNOSIS — D701 Agranulocytosis secondary to cancer chemotherapy: Secondary | ICD-10-CM | POA: Diagnosis not present

## 2021-01-26 DIAGNOSIS — D6959 Other secondary thrombocytopenia: Secondary | ICD-10-CM | POA: Diagnosis not present

## 2021-01-26 DIAGNOSIS — Z5189 Encounter for other specified aftercare: Secondary | ICD-10-CM | POA: Diagnosis not present

## 2021-01-26 DIAGNOSIS — C184 Malignant neoplasm of transverse colon: Secondary | ICD-10-CM | POA: Diagnosis not present

## 2021-01-26 DIAGNOSIS — C787 Secondary malignant neoplasm of liver and intrahepatic bile duct: Secondary | ICD-10-CM | POA: Diagnosis not present

## 2021-01-26 MED ORDER — SODIUM CHLORIDE 0.9% FLUSH
10.0000 mL | INTRAVENOUS | Status: DC | PRN
  Administered 2021-01-26: 10 mL
  Filled 2021-01-26: qty 10

## 2021-01-26 MED ORDER — HEPARIN SOD (PORK) LOCK FLUSH 100 UNIT/ML IV SOLN
500.0000 [IU] | Freq: Once | INTRAVENOUS | Status: AC | PRN
  Administered 2021-01-26: 500 [IU]
  Filled 2021-01-26: qty 5

## 2021-01-28 DIAGNOSIS — I4891 Unspecified atrial fibrillation: Secondary | ICD-10-CM | POA: Diagnosis not present

## 2021-01-28 DIAGNOSIS — I959 Hypotension, unspecified: Secondary | ICD-10-CM | POA: Diagnosis not present

## 2021-01-28 DIAGNOSIS — I110 Hypertensive heart disease with heart failure: Secondary | ICD-10-CM | POA: Diagnosis not present

## 2021-01-28 DIAGNOSIS — C787 Secondary malignant neoplasm of liver and intrahepatic bile duct: Secondary | ICD-10-CM | POA: Diagnosis not present

## 2021-01-28 DIAGNOSIS — I5032 Chronic diastolic (congestive) heart failure: Secondary | ICD-10-CM | POA: Diagnosis not present

## 2021-01-28 DIAGNOSIS — C189 Malignant neoplasm of colon, unspecified: Secondary | ICD-10-CM | POA: Diagnosis not present

## 2021-01-30 ENCOUNTER — Ambulatory Visit (INDEPENDENT_AMBULATORY_CARE_PROVIDER_SITE_OTHER): Payer: Medicare Other | Admitting: Student

## 2021-01-30 ENCOUNTER — Other Ambulatory Visit: Payer: Self-pay

## 2021-01-30 ENCOUNTER — Encounter: Payer: Self-pay | Admitting: Student

## 2021-01-30 VITALS — BP 136/74 | HR 63 | Ht 68.0 in | Wt 124.0 lb

## 2021-01-30 DIAGNOSIS — I110 Hypertensive heart disease with heart failure: Secondary | ICD-10-CM | POA: Diagnosis not present

## 2021-01-30 DIAGNOSIS — I4821 Permanent atrial fibrillation: Secondary | ICD-10-CM | POA: Diagnosis not present

## 2021-01-30 DIAGNOSIS — I959 Hypotension, unspecified: Secondary | ICD-10-CM | POA: Diagnosis not present

## 2021-01-30 DIAGNOSIS — D696 Thrombocytopenia, unspecified: Secondary | ICD-10-CM

## 2021-01-30 DIAGNOSIS — C185 Malignant neoplasm of splenic flexure: Secondary | ICD-10-CM | POA: Diagnosis not present

## 2021-01-30 DIAGNOSIS — Z79899 Other long term (current) drug therapy: Secondary | ICD-10-CM | POA: Diagnosis not present

## 2021-01-30 DIAGNOSIS — I5032 Chronic diastolic (congestive) heart failure: Secondary | ICD-10-CM | POA: Diagnosis not present

## 2021-01-30 DIAGNOSIS — I5031 Acute diastolic (congestive) heart failure: Secondary | ICD-10-CM

## 2021-01-30 DIAGNOSIS — C787 Secondary malignant neoplasm of liver and intrahepatic bile duct: Secondary | ICD-10-CM | POA: Diagnosis not present

## 2021-01-30 DIAGNOSIS — I4891 Unspecified atrial fibrillation: Secondary | ICD-10-CM | POA: Diagnosis not present

## 2021-01-30 DIAGNOSIS — C189 Malignant neoplasm of colon, unspecified: Secondary | ICD-10-CM | POA: Diagnosis not present

## 2021-01-30 MED ORDER — APIXABAN 2.5 MG PO TABS
2.5000 mg | ORAL_TABLET | Freq: Two times a day (BID) | ORAL | Status: AC
Start: 2021-01-30 — End: ?

## 2021-01-30 MED ORDER — METOPROLOL TARTRATE 25 MG PO TABS: 12.5000 mg | ORAL_TABLET | Freq: Two times a day (BID) | ORAL | 3 refills | Status: AC

## 2021-01-30 MED ORDER — DIGOXIN 125 MCG PO TABS: 0.1250 mg | ORAL_TABLET | Freq: Every day | ORAL | 3 refills | Status: AC

## 2021-01-30 NOTE — Patient Instructions (Addendum)
Medication Instructions:  RESTART Lopressor 12.5 mg twice daily and Eliquis 2.5 mg twice daily *If you need a refill on your cardiac medications before your next appointment, please call your pharmacy*   Lab Work: BMET DIGOXIN If you have labs (blood work) drawn today and your tests are completely normal, you will receive your results only by: Marland Kitchen MyChart Message (if you have MyChart) OR . A paper copy in the mail If you have any lab test that is abnormal or we need to change your treatment, we will call you to review the results.   Testing/Procedures: None   Follow-Up: At [facility], you and your health needs are our priority.  As part of our continuing mission to provide you with exceptional heart care, we have created designated Provider Care Teams.  These Care Teams include your primary Cardiologist (physician) and Advanced Practice Providers (APPs -  Physician Assistants and Nurse Practitioners) who all work together to provide you with the care you need, when you need it.  We recommend signing up for the patient portal called "MyChart".  Sign up information is provided on this After Visit Summary.  MyChart is used to connect with patients for Virtual Visits (Telemedicine).  Patients are able to view lab/test results, encounter notes, upcoming appointments, etc.  Non-urgent messages can be sent to your provider as well.   To learn more about what you can do with MyChart, go to [email or web address].    Your next appointment:   3 month(s)  The format for your next appointment:   In Person  Provider:   Bernerd Pho, PA-C   Other Instructions None

## 2021-01-30 NOTE — Progress Notes (Signed)
Cardiology Office Note    Date:  01/30/2021   ID:  Dylan York, DOB 08/01/1926, MRN [id number]  PCP:  Glenda Chroman, MD  Cardiologist: Shelva Majestic, MD    Chief Complaint  Patient presents with  . Hospitalization Follow-up    History of Present Illness:    Dylan York is a 87 y.o. male with past medical history of permanent atrial fibrillation, chronic combined systolic and diastolic CHF (EF 43-15% by echo in 02/2020), HTN, history of CVA, hypothyroidism, anemia and colon cancer (s/p partial colectomy in 04/2020, recurrent metastatic disease to the liver by imaging in 09/2020 and currently undergoing chemotherapy) who presents to the office today for hospital follow-up.  He most recently presented to [facility] ED on 01/15/2021 for evaluation of worsening fatigue and was found to be in atrial fibrillation with RVR. Was also found to have an acute CHF exacerbation with BNP elevated to 2845. Repeat echocardiogram showed that his EF had improved to 60 to 65% with no regional wall motion abnormalities.  He did have mild LVH, severe biatrial dilation, trivial MR and mild to moderate aortic valve sclerosis without stenosis. His Toprol-XL had previously been discontinued prior to admission due to hypotension but this was eventually restarted at a lower dose of Lopressor 12.5 mg twice daily along with Digoxin 0.125 mg daily being added. He was not started on Amiodarone at that time given prior elevated LFT's with this in 2019. Eliquis was held at the time of discharge due to worsening thrombocytopenia as his platelet count was down to 27K during admission.   He did follow-up with Oncology and his platelet count had improved to 64K on 01/23/2021 and he says they deferred restarting Eliquis to Cardiology.   In talking with the patient and his caregiver today, he reports still feeling very weak since returning home from his hospitalization. He plans to review this with his Oncologist but he does  not wish to undergo further chemotherapy treatments given the side effects he has been experiencing with this over the past few months.  His breathing has been stable since hospital discharge and he denies any specific orthopnea or PND. He has been taking Lasix 20 mg either daily or BID depending on his edema. His heart rate has been variable when checked at home but has mostly been in the 60's to 90's. No specific chest pain or palpitations.   Past Medical History:  Diagnosis Date  . Atrial fibrillation ([place])   . Atrial tachycardia ([place])   . Biatrial enlargement    severe biatrial enlargement  . BPH (benign prostatic hyperplasia)   . CHF (congestive heart failure) ([place])   . Colon cancer ([place])   . Diabetes mellitus without complication ([place])   . Glaucoma   . Graded compression stocking in place   . Hypertension   . Hypothyroidism   . Neutropenia ([place])   . Rosacea   . Sinus bradycardia, with HR in the 40s BB stopped. 08/08/2013  . SSS (sick sinus syndrome) ([place])   . Stroke [facility])    no residual  . Thyroid disease   . Tricuspid regurgitation     Past Surgical History:  Procedure Laterality Date  . APPENDECTOMY    . BACTERIAL OVERGROWTH TEST N/A 01/14/2016   Procedure: BACTERIAL OVERGROWTH TEST;  Surgeon: Rogene Houston, MD;  Location: [facility] ENDO SUITE;  Service: Endoscopy;  Laterality: N/A;  730  . BIOPSY  02/09/2020   Procedure: BIOPSY;  Surgeon: Hildred Laser  U, MD;  Location: [facility] ENDO SUITE;  Service: Endoscopy;;  . COLONOSCOPY N/A 02/09/2020   Procedure: COLONOSCOPY;  Surgeon: Rogene Houston, MD;  Location: [facility] ENDO SU[facility];  Service: Endoscopy;  Laterality: N/A;  1225  . HERNIA REPAIR    . INGUINAL HERNIA REPAIR Right   . IR IMAGING GUIDED PORT INSERTION  10/08/2020  . POLYPECTOMY  02/09/2020   Procedure: POLYPECTOMY;  Surgeon: Rogene Houston, MD;  Location: [facility] SU[facility];  Service: Endoscopy;;  . TRANSURETHRAL RESECTION OF PROSTATE    . UMBILICAL HERNIA REPAIR      Current  Medications: Outpatient Medications Prior to Visit  Medication Sig Dispense Refill  . COMBIGAN 0.2-0.5 % ophthalmic solution Place 1 drop into both eyes 2 (two) times daily.    Marland Kitchen docusate sodium (COLACE) 100 MG capsule Take 2 capsules (200 mg total) by mouth at bedtime. 60 capsule 0  . finasteride (PROSCAR) 5 MG tablet Take 5 mg by mouth daily.    . furosemide (LASIX) 20 MG tablet Take 40 mg by mouth daily as needed.    Marland Kitchen levothyroxine (SYNTHROID, LEVOTHROID) 75 MCG tablet Take 75 mcg by mouth daily before breakfast.     . lidocaine-prilocaine (EMLA) cream Apply to port site 1-2 hours prior to use 30 g 2  . LORazepam (ATIVAN) 1 MG tablet Take 0.5 mg by mouth at bedtime as needed for sleep.   2  . potassium chloride (KLOR-CON) 10 MEQ tablet Take 10 mEq by mouth 2 (two) times daily.    . prochlorperazine (COMPAZINE) 5 MG tablet Take 1 tablet (5 mg total) by mouth every 6 (six) hours as needed for nausea or vomiting. 30 tablet 2  . tamsulosin (FLOMAX) 0.4 MG CAPS capsule Take 1 capsule (0.4 mg total) by mouth at bedtime. 90 capsule 3  . Vitamin D, Cholecalciferol, 50 MCG (2000 UT) CAPS Take 2,000 Units by mouth daily.     . digoxin (LANOXIN) 0.125 MG tablet Take 1 tablet (0.125 mg total) by mouth daily. 30 tablet 1  . metoprolol tartrate (LOPRESSOR) 25 MG tablet Take 0.5 tablets (12.5 mg total) by mouth 2 (two) times daily. 60 tablet 1  . apixaban (ELIQUIS) 2.5 MG TABS tablet Take 1 tablet (2.5 mg total) by mouth 2 (two) times daily. Hold until follow up with oncology/cardiology service as an outpatient.     No facility-administered medications prior to visit.     Allergies:   Amiodarone and Multaq [dronedarone]   Social History   Socioeconomic History  . Marital status: Married    Spouse name: Not on file  . Number of children: Not on file  . Years of education: Not on file  . Highest education level: Not on file  Occupational History  . Not on file  Tobacco Use  . Smoking status:  Former Smoker    Quit date: 08/1977    Years since quitting: 43.5  . Smokeless tobacco: Never Used  Vaping Use  . Vaping Use: Never used  Substance and Sexual Activity  . Alcohol use: No    Alcohol/week: 0.0 standard drinks  . Drug use: No  . Sexual activity: Not on file  Other Topics Concern  . Not on file  Social History Narrative  . Not on file   Social Determinants of Health   Financial Resource Strain: Low Risk   . Difficulty of Paying Living Expenses: Not hard at all  Food Insecurity: No Food Insecurity  . Worried About Charity fundraiser  in the Last Year: Never true  . Ran Out of Food in the Last Year: Never true  Transportation Needs: No Transportation Needs  . Lack of Transportation (Medical): No  . Lack of Transportation (Non-Medical): No  Physical Activity: Inactive  . Days of Exercise per Week: 0 days  . Minutes of Exercise per Session: 0 min  Stress: No Stress Concern Present  . Feeling of Stress : Not at all  Social Connections: Socially Integrated  . Frequency of Communication with Friends and Family: More than three times a week  . Frequency of Social Gatherings with Friends and Family: Three times a week  . Attends Religious Services: 1 to 4 times per year  . Active Member of Clubs or Organizations: No  . Attends Archivist Meetings: 1 to 4 times per year  . Marital Status: Married     Family History:  The patient's family history includes Cancer in his mother; Cancer - Prostate in his paternal grandfather; Heart Problems in his father.   Review of Systems:   Please see the history of present illness.     General:  No chills, fever, night sweats or weight changes. Positive for fatigue.  Cardiovascular:  No chest pain, dyspnea on exertion, orthopnea, palpitations, paroxysmal nocturnal dyspnea. Positive for edema.  Dermatological: No rash, lesions/masses Respiratory: No cough, dyspnea Urologic: No hematuria, dysuria Abdominal:   No nausea,  vomiting, diarrhea, bright red blood per rectum, melena, or hematemesis Neurologic:  No visual changes, wkns, changes in mental status. All other systems reviewed and are otherwise negative except as noted above.   Physical Exam:    VS:  BP 136/74   Pulse 63   Ht 5\' 8"  (1.727 m)   Wt 124 lb (56.2 kg)   SpO2 98%   BMI 18.85 kg/m    General: Thin elderly male appearing in no acute distress. Head: Normocephalic, atraumatic. Neck: No carotid bruits. JVD not elevated.  Lungs: Respirations regular and unlabored, without wheezes or rales.  Heart: Irregularly irregular. No S3 or S4.  No murmur, no rubs, or gallops appreciated. Abdomen: Appears non-distended. No obvious abdominal masses. Msk:  Strength and tone appear normal for age. No obvious joint deformities or effusions. Extremities: No clubbing or cyanosis. Trace lower extremity edema.  Distal pedal pulses are 2+ bilaterally. Neuro: Alert and oriented X 3. Moves all extremities spontaneously. No focal deficits noted. Psych:  Responds to questions appropriately with a normal affect. Skin: No rashes or lesions noted  Wt Readings from Last 3 Encounters:  01/30/21 124 lb (56.2 kg)  01/24/21 124 lb 14.4 oz (56.7 kg)  01/17/21 128 lb 1.4 oz (58.1 kg)     Studies/Labs Reviewed:   EKG:  EKG is not ordered today.   Recent Labs: 01/15/2021: B Natriuretic Peptide 2,845.0 01/16/2021: Magnesium 1.8; TSH 1.216 01/23/2021: ALT 19; BUN 20; Creatinine 0.95; Hemoglobin 11.2; Platelets 64; Potassium 3.6; Sodium 141   Lipid Panel    Component Value Date/Time   CHOL 122 03/09/2017 0831   TRIG 75 03/09/2017 0831   HDL 64 03/09/2017 0831   CHOLHDL 3.1 09/08/2016 0507   VLDL 18 09/08/2016 0507   LDLCALC 43 03/09/2017 0831    Additional studies/ records that were reviewed today include:   Echocardiogram: 01/2021 IMPRESSIONS    1. Left ventricular ejection fraction, by estimation, is 60 to 65%. The  left ventricle has normal function. The  left ventricle has no regional  wall motion abnormalities. There is mild left ventricular  hypertrophy.  Left ventricular diastolic parameters  are indeterminate.  2. Right ventricular systolic function is normal. The right ventricular  size is moderately enlarged. There is mildly elevated pulmonary artery  systolic pressure. The estimated right ventricular systolic pressure is  13.0 mmHg.  3. Left atrial size was severely dilated.  4. Right atrial size was severely dilated.  5. The mitral valve is abnormal. Trivial mitral valve regurgitation.  Moderate mitral annular calcification.  6. Tricuspid valve regurgitation is moderate.  7. The aortic valve is tricuspid. There is moderate calcification of the  aortic valve. Aortic valve regurgitation is trivial. Mild to moderate  aortic valve sclerosis/calcification is present, without any evidence of  aortic stenosis. Aortic regurgitation  PHT measures 714 msec.  8. The inferior vena cava is dilated in size with >50% respiratory  variability, suggesting right atrial pressure of 8 mmHg.   Assessment:    1. Permanent atrial fibrillation ([place])   2. Medication management   3. Acute diastolic CHF (congestive heart failure) ([place])   4. Malignant neoplasm of splenic flexure ([place])   5. Thrombocytopenia ([place])      Plan:   In order of problems listed above:  1. Permanent Atrial Fibrillation/Use of Anticoagulation - His HR has overall improved since hospital discharge and is in the 60's during today's visit. He reports this has been variable at home but is much improved as compared to prior values and his hypotension has resolved. He has been taking 12.5mg  of Toprol-XL and I recommended he take Lopressor 12.5mg  BID as prescribed during his hospital stay and continue Digoxin 0.125mg  daily. Will check a Dig Level with upcoming labs.  - We reviewed risks and benefits of anticoagulation today and he does wish to restart Eliquis given his history  of a CVA. The correct dose of Eliquis at this time is 2.5 mg twice daily given his age greater than 71 and body weight less than 60 kg but may need to be adjusted pending fluctuations in his weight.   2. Acute Diastolic CHF Exacerbation - He was recently admitted for an acute CHF exacerbation in the setting of atrial fibrillation with RVR. Repeat echocardiogram showed a preserved EF of 60 to 65% as outlined above. - He alternates between taking Lasix 20 mg daily and 20 mg twice daily depending on his edema. I also encouraged him to follow daily weights and take a Lasix tablet if weight gain greater than 3 pounds overnight or 5 pounds in 1 week. Will recheck a BMET with upcoming labs.   3. Metastatic Colon Cancer - He is s/p partial colectomy in 04/2020 with recurrent metastatic disease to the liver by imaging in 09/2020 and currently undergoing chemotherapy. During today's visit, he stated that he plans to review with his Oncologist at his next visit but he does not wish to undergo further chemotherapy treatments given the side effects and his age.  4. Thrombocytopenia - His platelet count was down to 27K during admission, improved to 64K on 01/23/2021. Being followed by Oncology and will route today's note to them. Will plan to restart Eliquis as outlined above.    Medication Adjustments/Labs and Tests Ordered: Current medicines are reviewed at length with the patient today.  Concerns regarding medicines are outlined above.  Medication changes, Labs and Tests ordered today are listed in the Patient Instructions below. Patient Instructions  Medication Instructions:  RESTART Lopressor 12.5 mg twice daily and Eliquis 2.5 mg twice daily *If you need a refill on your cardiac  medications before your next appointment, please call your pharmacy*   Lab Work: BMET DIGOXIN If you have labs (blood work) drawn today and your tests are completely normal, you will receive your results only by: Marland Kitchen MyChart  Message (if you have MyChart) OR . A paper copy in the mail If you have any lab test that is abnormal or we need to change your treatment, we will call you to review the results.   Testing/Procedures: None   Follow-Up: At [facility], you and your health needs are our priority.  As part of our continuing mission to provide you with exceptional heart care, we have created designated Provider Care Teams.  These Care Teams include your primary Cardiologist (physician) and Advanced Practice Providers (APPs -  Physician Assistants and Nurse Practitioners) who all work together to provide you with the care you need, when you need it.  We recommend signing up for the patient portal called "MyChart".  Sign up information is provided on this After Visit Summary.  MyChart is used to connect with patients for Virtual Visits (Telemedicine).  Patients are able to view lab/test results, encounter notes, upcoming appointments, etc.  Non-urgent messages can be sent to your provider as well.   To learn more about what you can do with MyChart, go to [email or web address].    Your next appointment:   3 month(s)  The format for your next appointment:   In Person  Provider:   Bernerd Pho, PA-C   Other Instructions None        Signed, Erma Heritage, PA-C  01/30/2021 5:34 PM    [place]. [address] [place], [place] [address] Phone: [phone] Fax: [phone]

## 2021-01-31 DIAGNOSIS — I959 Hypotension, unspecified: Secondary | ICD-10-CM | POA: Diagnosis not present

## 2021-01-31 DIAGNOSIS — I110 Hypertensive heart disease with heart failure: Secondary | ICD-10-CM | POA: Diagnosis not present

## 2021-01-31 DIAGNOSIS — C787 Secondary malignant neoplasm of liver and intrahepatic bile duct: Secondary | ICD-10-CM | POA: Diagnosis not present

## 2021-01-31 DIAGNOSIS — I4891 Unspecified atrial fibrillation: Secondary | ICD-10-CM | POA: Diagnosis not present

## 2021-01-31 DIAGNOSIS — C189 Malignant neoplasm of colon, unspecified: Secondary | ICD-10-CM | POA: Diagnosis not present

## 2021-01-31 DIAGNOSIS — I5032 Chronic diastolic (congestive) heart failure: Secondary | ICD-10-CM | POA: Diagnosis not present

## 2021-02-01 DIAGNOSIS — C189 Malignant neoplasm of colon, unspecified: Secondary | ICD-10-CM | POA: Diagnosis not present

## 2021-02-01 DIAGNOSIS — I5032 Chronic diastolic (congestive) heart failure: Secondary | ICD-10-CM | POA: Diagnosis not present

## 2021-02-01 DIAGNOSIS — I110 Hypertensive heart disease with heart failure: Secondary | ICD-10-CM | POA: Diagnosis not present

## 2021-02-01 DIAGNOSIS — C787 Secondary malignant neoplasm of liver and intrahepatic bile duct: Secondary | ICD-10-CM | POA: Diagnosis not present

## 2021-02-01 DIAGNOSIS — I4891 Unspecified atrial fibrillation: Secondary | ICD-10-CM | POA: Diagnosis not present

## 2021-02-01 DIAGNOSIS — I959 Hypotension, unspecified: Secondary | ICD-10-CM | POA: Diagnosis not present

## 2021-02-02 ENCOUNTER — Other Ambulatory Visit: Payer: Self-pay | Admitting: Oncology

## 2021-02-04 DIAGNOSIS — I4891 Unspecified atrial fibrillation: Secondary | ICD-10-CM | POA: Diagnosis not present

## 2021-02-04 DIAGNOSIS — I5032 Chronic diastolic (congestive) heart failure: Secondary | ICD-10-CM | POA: Diagnosis not present

## 2021-02-04 DIAGNOSIS — I959 Hypotension, unspecified: Secondary | ICD-10-CM | POA: Diagnosis not present

## 2021-02-04 DIAGNOSIS — C787 Secondary malignant neoplasm of liver and intrahepatic bile duct: Secondary | ICD-10-CM | POA: Diagnosis not present

## 2021-02-04 DIAGNOSIS — I110 Hypertensive heart disease with heart failure: Secondary | ICD-10-CM | POA: Diagnosis not present

## 2021-02-04 DIAGNOSIS — C189 Malignant neoplasm of colon, unspecified: Secondary | ICD-10-CM | POA: Diagnosis not present

## 2021-02-06 ENCOUNTER — Telehealth: Payer: Self-pay

## 2021-02-06 DIAGNOSIS — C787 Secondary malignant neoplasm of liver and intrahepatic bile duct: Secondary | ICD-10-CM | POA: Diagnosis not present

## 2021-02-06 DIAGNOSIS — I110 Hypertensive heart disease with heart failure: Secondary | ICD-10-CM | POA: Diagnosis not present

## 2021-02-06 DIAGNOSIS — I4891 Unspecified atrial fibrillation: Secondary | ICD-10-CM | POA: Diagnosis not present

## 2021-02-06 DIAGNOSIS — C189 Malignant neoplasm of colon, unspecified: Secondary | ICD-10-CM | POA: Diagnosis not present

## 2021-02-06 DIAGNOSIS — I5032 Chronic diastolic (congestive) heart failure: Secondary | ICD-10-CM | POA: Diagnosis not present

## 2021-02-06 DIAGNOSIS — I959 Hypotension, unspecified: Secondary | ICD-10-CM | POA: Diagnosis not present

## 2021-02-06 NOTE — Telephone Encounter (Signed)
-----   Message from [facility] sent at 02/05/2021  1:05 PM EDT ----- Patient called in . Says he does not want to continue treatments wants to know if he still needs to come in for scan on 4/7 . Would like a call from RN this afternoon if possible.

## 2021-02-06 NOTE — Telephone Encounter (Signed)
Return call to Pt to inform him that Dr Benay Spice is aware that he doesn't want to do Chemo anymore, but still wants him to do the CT Scan. Pt confirmed he will do CT Scan tomorrow.

## 2021-02-07 ENCOUNTER — Inpatient Hospital Stay: Payer: Medicare Other

## 2021-02-07 ENCOUNTER — Encounter: Payer: Self-pay | Admitting: Nurse Practitioner

## 2021-02-07 ENCOUNTER — Ambulatory Visit (HOSPITAL_BASED_OUTPATIENT_CLINIC_OR_DEPARTMENT_OTHER)
Admission: RE | Admit: 2021-02-07 | Discharge: 2021-02-07 | Disposition: A | Discharge status: Home / Self Care | Payer: Medicare Other | Source: Ambulatory Visit | Attending: Oncology | Admitting: Oncology

## 2021-02-07 ENCOUNTER — Encounter (HOSPITAL_BASED_OUTPATIENT_CLINIC_OR_DEPARTMENT_OTHER): Payer: Self-pay

## 2021-02-07 ENCOUNTER — Other Ambulatory Visit: Payer: Self-pay

## 2021-02-07 ENCOUNTER — Inpatient Hospital Stay: Payer: Medicare Other | Attending: Oncology | Admitting: Nurse Practitioner

## 2021-02-07 VITALS — BP 130/70 | HR 80 | Temp 98.1°F | Resp 18 | Ht 68.0 in | Wt 125.4 lb

## 2021-02-07 DIAGNOSIS — I700 Atherosclerosis of aorta: Secondary | ICD-10-CM | POA: Diagnosis not present

## 2021-02-07 DIAGNOSIS — R911 Solitary pulmonary nodule: Secondary | ICD-10-CM | POA: Insufficient documentation

## 2021-02-07 DIAGNOSIS — C787 Secondary malignant neoplasm of liver and intrahepatic bile duct: Secondary | ICD-10-CM | POA: Insufficient documentation

## 2021-02-07 DIAGNOSIS — Z95828 Presence of other vascular implants and grafts: Secondary | ICD-10-CM

## 2021-02-07 DIAGNOSIS — C189 Malignant neoplasm of colon, unspecified: Secondary | ICD-10-CM

## 2021-02-07 LAB — CMP (CANCER CENTER ONLY)
ALT: 16 U/L (ref 0–44)
AST: 39 U/L (ref 15–41)
Albumin: 3.4 g/dL — ABNORMAL LOW (ref 3.5–5.0)
Alkaline Phosphatase: 108 U/L (ref 38–126)
Anion gap: 7 (ref 5–15)
BUN: 19 mg/dL (ref 8–23)
CO2: 24 mmol/L (ref 22–32)
Calcium: 9.1 mg/dL (ref 8.9–10.3)
Chloride: 106 mmol/L (ref 98–111)
Creatinine: 0.81 mg/dL (ref 0.61–1.24)
GFR, Estimated: >60 mL/min (ref >60)
Glucose, Bld: 93 mg/dL (ref 70–99)
Potassium: 4.3 mmol/L (ref 3.5–5.1)
Sodium: 137 mmol/L (ref 135–145)
Total Bilirubin: 1.6 mg/dL — ABNORMAL HIGH (ref 0.3–1.2)
Total Protein: 5.9 g/dL — ABNORMAL LOW (ref 6.5–8.1)

## 2021-02-07 LAB — CBC WITH DIFFERENTIAL (CANCER CENTER ONLY)
Abs Immature Granulocytes: 0.04 10*3/uL (ref 0.00–0.07)
Basophils Absolute: 0 10*3/uL (ref 0.0–0.1)
Basophils Relative: 1 %
Eosinophils Absolute: 0 10*3/uL (ref 0.0–0.5)
Eosinophils Relative: 1 %
HCT: 35.2 % — ABNORMAL LOW (ref 39.0–52.0)
Hemoglobin: 11.5 g/dL — ABNORMAL LOW (ref 13.0–17.0)
Immature Granulocytes: 1 %
Lymphocytes Relative: 22 %
Lymphs Abs: 0.9 10*3/uL (ref 0.7–4.0)
MCH: 35.2 pg — ABNORMAL HIGH (ref 26.0–34.0)
MCHC: 32.7 g/dL (ref 30.0–36.0)
MCV: 107.6 fL — ABNORMAL HIGH (ref 80.0–100.0)
Monocytes Absolute: 0.6 10*3/uL (ref 0.1–1.0)
Monocytes Relative: 14 %
Neutro Abs: 2.5 10*3/uL (ref 1.7–7.7)
Neutrophils Relative %: 61 %
Platelet Count: 75 10*3/uL — ABNORMAL LOW (ref 150–400)
RBC: 3.27 MIL/uL — ABNORMAL LOW (ref 4.22–5.81)
RDW: 16.9 % — ABNORMAL HIGH (ref 11.5–15.5)
WBC Count: 4.1 10*3/uL (ref 4.0–10.5)
nRBC: 0 % (ref 0.0–0.2)

## 2021-02-07 LAB — CEA (IN HOUSE-CHCC): CEA (CHCC-In House): 2354.59 ng/mL — ABNORMAL HIGH (ref 0.00–5.00)

## 2021-02-07 MED ORDER — IOHEXOL 300 MG/ML  SOLN
80.0000 mL | Freq: Once | INTRAMUSCULAR | Status: AC | PRN
  Administered 2021-02-07: 80 mL via INTRAVENOUS

## 2021-02-07 MED ORDER — SODIUM CHLORIDE 0.9% FLUSH
10.0000 mL | INTRAVENOUS | Status: DC | PRN
Start: 2021-02-07 — End: 2021-02-07
  Administered 2021-02-07: 10 mL via INTRAVENOUS
  Filled 2021-02-07: qty 10

## 2021-02-07 NOTE — Progress Notes (Addendum)
[facility] OFFICE PROGRESS NOTE   Diagnosis: Colon cancer  INTERVAL HISTORY:   Dylan York returns as scheduled.  He completed cycle 6 FOLFOX 01/24/2021.  Oxaliplatin was held due to thrombocytopenia.  He denies nausea/vomiting.  No mouth sores.  No diarrhea.  Some constipation.  Main complaints are feeling "tired and weak".  He states he does not want to continue chemotherapy.  Objective:  Vital signs in last 24 hours:  Blood pressure 130/70, pulse 80, temperature 98.1 F (36.7 C), temperature source Tympanic, resp. rate 18, height 5\' 8"  (1.727 m), weight 125 lb 6.4 oz (56.9 kg), SpO2 100 %.    HEENT: Mild white coating over tongue.  No ulcers. Resp: Lungs clear bilaterally. Cardio: Irregular. GI: Liver palpable right upper abdomen. Vascular: Trace to 1+ pitting edema at the lower leg bilaterally.  Skin: Small ecchymosis right forearm. Port-A-Cath without erythema.  Lab Results:  Lab Results  Component Value Date   WBC 4.1 02/07/2021   HGB 11.5 (L) 02/07/2021   HCT 35.2 (L) 02/07/2021   MCV 107.6 (H) 02/07/2021   PLT 75 (L) 02/07/2021   NEUTROABS 2.5 02/07/2021    Imaging:  No results found.  Medications: I have reviewed the patient's current medications.  Assessment/Plan: 1. Colon cancer-transverse, stage IIIb (T3N1), status post a segmental colectomy 04/13/2020 ? 1/10 lymph nodes, lymphovascular invasion present, MSS, resection margins negative for invasive cancer, "closest" margin positive for low-grade dysplasia; microsatellite stable, tumor mutation burden 3, KRAS G12D ? Colonoscopy 02/09/2020-ulcerated nonobstructing mass at the splenic flexure, 4 mm polyp of the rectum, splenic flexure lesion-adenocarcinoma with intact mismatch repair protein expression, tubular adenoma at the rectal polyp ? CT abdomen/pelvis 02/16/2020-apple core lesion at the distal transverse colon, no evidence of metastatic disease ? CT chest 03/06/2020-no evidence of metastatic  disease, stable benign lung nodules ? CT abdomen/pelvis 09/20/2020-interval development of widespread metastatic disease to the liver with multiple hypervascular hepatic masses, largest measuring 8.6 x 6.2 x 6.2 cm. Enlarged gastrohepatic ligament lymph node. ? Cycle 1 FOLFOX 10/11/2020 ? Cycle 2 FOLFOX 11/10/2020, Udenyca ? Cycle 3 FOLFOX 11/26/2020, oxaliplatin dose decreased secondary to thrombocytopenia ? Cycle 4 FOLFOX2/14/2022,postponedand oxaliplatin dose decreased secondary to progressivethrombocytopenia  ? Chemotherapy held due to thrombocytopenia 01/01/2021 ? Cycle 5 FOLFOX 01/09/2021-oxaliplatin dose reduced secondary to thrombocytopenia ? Cycle 6 FOLFOX 01/24/2021-oxaliplatin held due to thrombocytopenia ? CTs 02/07/2021-response to therapy with decrease in size of index liver metastases.  Subpleural nodule posterior right lower lobe also decreased.  No progressive or new sites of disease.  Stable 8 mm gastrohepatic ligament lymph node. 2. Rectal bleeding secondary to #1 3. Anemia-likely secondary to GI bleeding 4. Atrial fibrillation, maintained on apixaban-anticoagulation on hold 5. BPH 6. Glaucoma 7. Hypertension 8. Hypothyroid 9. History of a CVA 10. Positive resection margin for low-grade dysplasia on the colectomy specimen 04/13/2020 11. Neutropenia following cycle 1 FOLFOX, Udenyca added with cycle 2 12. Thrombocytopenia, secondary to chemotherapy 13. Lower extremity venous Doppler 12/19/2020-no evidence of DVT right lower extremity, left lower extremity. 14. Admission with atrial fibrillation 01/15/2021   Disposition: Dylan York appears unchanged.  He has completed 6 cycles of FOLFOX.  Oxaliplatin was held with cycle 6 due to thrombocytopenia.  Restaging CTs from earlier today show improvement in liver metastases and a right lower lobe lung nodule.  No new or progressive disease.  Dr. Kathlene November reviewed the results/images with Dylan York and his caregiver at today's appointment.  He  is undecided whether or not he would like to  continue treatment.  We discussed further dose reducing the current chemotherapy, continuing the 5-FU pump alone on a 3-week schedule, referral to the [facility] program.  He would like to return in 1 week for further discussion.  We will tentatively schedule him for the 5-FU pump that day.  Patient seen with Dr. Benay Spice.    Ned Card ANP/GNP-BC   02/07/2021  11:35 AM  This was a shared visit with Ned Card.  We reviewed the restaging CT images and discussed treatment options with [name].  He remains undecided on whether to continue chemotherapy.  We discussed the prognosis.  We discussed comfort care versus continuing systemic chemotherapy.  We reviewed options of infusional 5-FU on a 3-week schedule and changing to capecitabine.  He will return for an office visit and further discussion in 1 week.  I was present for greater than 50% of today's visit.  I for medical decision making.  Dylan Manson, MD

## 2021-02-07 NOTE — Patient Instructions (Signed)

## 2021-02-08 DIAGNOSIS — I5032 Chronic diastolic (congestive) heart failure: Secondary | ICD-10-CM | POA: Diagnosis not present

## 2021-02-08 DIAGNOSIS — I959 Hypotension, unspecified: Secondary | ICD-10-CM | POA: Diagnosis not present

## 2021-02-08 DIAGNOSIS — I110 Hypertensive heart disease with heart failure: Secondary | ICD-10-CM | POA: Diagnosis not present

## 2021-02-08 DIAGNOSIS — I4891 Unspecified atrial fibrillation: Secondary | ICD-10-CM | POA: Diagnosis not present

## 2021-02-08 DIAGNOSIS — C787 Secondary malignant neoplasm of liver and intrahepatic bile duct: Secondary | ICD-10-CM | POA: Diagnosis not present

## 2021-02-08 DIAGNOSIS — C189 Malignant neoplasm of colon, unspecified: Secondary | ICD-10-CM | POA: Diagnosis not present

## 2021-02-11 DIAGNOSIS — I110 Hypertensive heart disease with heart failure: Secondary | ICD-10-CM | POA: Diagnosis not present

## 2021-02-11 DIAGNOSIS — C189 Malignant neoplasm of colon, unspecified: Secondary | ICD-10-CM | POA: Diagnosis not present

## 2021-02-11 DIAGNOSIS — C787 Secondary malignant neoplasm of liver and intrahepatic bile duct: Secondary | ICD-10-CM | POA: Diagnosis not present

## 2021-02-11 DIAGNOSIS — I959 Hypotension, unspecified: Secondary | ICD-10-CM | POA: Diagnosis not present

## 2021-02-11 DIAGNOSIS — I4891 Unspecified atrial fibrillation: Secondary | ICD-10-CM | POA: Diagnosis not present

## 2021-02-11 DIAGNOSIS — I5032 Chronic diastolic (congestive) heart failure: Secondary | ICD-10-CM | POA: Diagnosis not present

## 2021-02-12 ENCOUNTER — Telehealth: Payer: Self-pay | Admitting: *Deleted

## 2021-02-12 DIAGNOSIS — I5032 Chronic diastolic (congestive) heart failure: Secondary | ICD-10-CM | POA: Diagnosis not present

## 2021-02-12 DIAGNOSIS — C787 Secondary malignant neoplasm of liver and intrahepatic bile duct: Secondary | ICD-10-CM | POA: Diagnosis not present

## 2021-02-12 DIAGNOSIS — C189 Malignant neoplasm of colon, unspecified: Secondary | ICD-10-CM | POA: Diagnosis not present

## 2021-02-12 DIAGNOSIS — I959 Hypotension, unspecified: Secondary | ICD-10-CM | POA: Diagnosis not present

## 2021-02-12 DIAGNOSIS — I4891 Unspecified atrial fibrillation: Secondary | ICD-10-CM | POA: Diagnosis not present

## 2021-02-12 DIAGNOSIS — I110 Hypertensive heart disease with heart failure: Secondary | ICD-10-CM | POA: Diagnosis not present

## 2021-02-12 NOTE — Telephone Encounter (Signed)
Home health nurse with patient today and he has decided he wants no more chemo and wishes for a Hospice referral. [facility] (wife is already a patient with them). She will flush and de-access his port. Notified pharmacy and infusion of cancellation.

## 2021-02-13 ENCOUNTER — Telehealth: Payer: Self-pay

## 2021-02-13 ENCOUNTER — Inpatient Hospital Stay: Payer: Medicare Other

## 2021-02-13 ENCOUNTER — Inpatient Hospital Stay: Payer: Medicare Other | Admitting: Nurse Practitioner

## 2021-02-13 DIAGNOSIS — I639 Cerebral infarction, unspecified: Secondary | ICD-10-CM | POA: Diagnosis not present

## 2021-02-13 DIAGNOSIS — I5032 Chronic diastolic (congestive) heart failure: Secondary | ICD-10-CM | POA: Diagnosis not present

## 2021-02-13 DIAGNOSIS — I482 Chronic atrial fibrillation, unspecified: Secondary | ICD-10-CM | POA: Diagnosis not present

## 2021-02-13 DIAGNOSIS — C189 Malignant neoplasm of colon, unspecified: Secondary | ICD-10-CM | POA: Diagnosis not present

## 2021-02-13 DIAGNOSIS — N400 Enlarged prostate without lower urinary tract symptoms: Secondary | ICD-10-CM | POA: Diagnosis not present

## 2021-02-13 DIAGNOSIS — I10 Essential (primary) hypertension: Secondary | ICD-10-CM | POA: Diagnosis not present

## 2021-02-13 DIAGNOSIS — R531 Weakness: Secondary | ICD-10-CM | POA: Diagnosis not present

## 2021-02-13 DIAGNOSIS — H409 Unspecified glaucoma: Secondary | ICD-10-CM | POA: Diagnosis not present

## 2021-02-13 DIAGNOSIS — C787 Secondary malignant neoplasm of liver and intrahepatic bile duct: Secondary | ICD-10-CM | POA: Diagnosis not present

## 2021-02-13 DIAGNOSIS — I959 Hypotension, unspecified: Secondary | ICD-10-CM | POA: Diagnosis not present

## 2021-02-13 DIAGNOSIS — I110 Hypertensive heart disease with heart failure: Secondary | ICD-10-CM | POA: Diagnosis not present

## 2021-02-13 DIAGNOSIS — D649 Anemia, unspecified: Secondary | ICD-10-CM | POA: Diagnosis not present

## 2021-02-13 DIAGNOSIS — I4891 Unspecified atrial fibrillation: Secondary | ICD-10-CM | POA: Diagnosis not present

## 2021-02-13 NOTE — Telephone Encounter (Signed)
TC from [place] at [facility] to confirm Dr Benay Spice will be the attending for Pt.

## 2021-02-14 DIAGNOSIS — C189 Malignant neoplasm of colon, unspecified: Secondary | ICD-10-CM | POA: Diagnosis not present

## 2021-02-14 DIAGNOSIS — N400 Enlarged prostate without lower urinary tract symptoms: Secondary | ICD-10-CM | POA: Diagnosis not present

## 2021-02-14 DIAGNOSIS — D649 Anemia, unspecified: Secondary | ICD-10-CM | POA: Diagnosis not present

## 2021-02-14 DIAGNOSIS — I10 Essential (primary) hypertension: Secondary | ICD-10-CM | POA: Diagnosis not present

## 2021-02-14 DIAGNOSIS — I482 Chronic atrial fibrillation, unspecified: Secondary | ICD-10-CM | POA: Diagnosis not present

## 2021-02-14 DIAGNOSIS — H409 Unspecified glaucoma: Secondary | ICD-10-CM | POA: Diagnosis not present

## 2021-02-15 DIAGNOSIS — I10 Essential (primary) hypertension: Secondary | ICD-10-CM | POA: Diagnosis not present

## 2021-02-15 DIAGNOSIS — D649 Anemia, unspecified: Secondary | ICD-10-CM | POA: Diagnosis not present

## 2021-02-15 DIAGNOSIS — C189 Malignant neoplasm of colon, unspecified: Secondary | ICD-10-CM | POA: Diagnosis not present

## 2021-02-15 DIAGNOSIS — H409 Unspecified glaucoma: Secondary | ICD-10-CM | POA: Diagnosis not present

## 2021-02-15 DIAGNOSIS — I482 Chronic atrial fibrillation, unspecified: Secondary | ICD-10-CM | POA: Diagnosis not present

## 2021-02-15 DIAGNOSIS — N400 Enlarged prostate without lower urinary tract symptoms: Secondary | ICD-10-CM | POA: Diagnosis not present

## 2021-02-20 DIAGNOSIS — C189 Malignant neoplasm of colon, unspecified: Secondary | ICD-10-CM | POA: Diagnosis not present

## 2021-02-20 DIAGNOSIS — I10 Essential (primary) hypertension: Secondary | ICD-10-CM | POA: Diagnosis not present

## 2021-02-20 DIAGNOSIS — H409 Unspecified glaucoma: Secondary | ICD-10-CM | POA: Diagnosis not present

## 2021-02-20 DIAGNOSIS — N400 Enlarged prostate without lower urinary tract symptoms: Secondary | ICD-10-CM | POA: Diagnosis not present

## 2021-02-20 DIAGNOSIS — D649 Anemia, unspecified: Secondary | ICD-10-CM | POA: Diagnosis not present

## 2021-02-20 DIAGNOSIS — I482 Chronic atrial fibrillation, unspecified: Secondary | ICD-10-CM | POA: Diagnosis not present

## 2021-02-22 DIAGNOSIS — N400 Enlarged prostate without lower urinary tract symptoms: Secondary | ICD-10-CM | POA: Diagnosis not present

## 2021-02-22 DIAGNOSIS — H409 Unspecified glaucoma: Secondary | ICD-10-CM | POA: Diagnosis not present

## 2021-02-22 DIAGNOSIS — C189 Malignant neoplasm of colon, unspecified: Secondary | ICD-10-CM | POA: Diagnosis not present

## 2021-02-22 DIAGNOSIS — I10 Essential (primary) hypertension: Secondary | ICD-10-CM | POA: Diagnosis not present

## 2021-02-22 DIAGNOSIS — I482 Chronic atrial fibrillation, unspecified: Secondary | ICD-10-CM | POA: Diagnosis not present

## 2021-02-22 DIAGNOSIS — D649 Anemia, unspecified: Secondary | ICD-10-CM | POA: Diagnosis not present

## 2021-02-26 ENCOUNTER — Ambulatory Visit (INDEPENDENT_AMBULATORY_CARE_PROVIDER_SITE_OTHER): Payer: Medicare Other | Admitting: Internal Medicine

## 2021-02-26 DIAGNOSIS — H409 Unspecified glaucoma: Secondary | ICD-10-CM | POA: Diagnosis not present

## 2021-02-26 DIAGNOSIS — I10 Essential (primary) hypertension: Secondary | ICD-10-CM | POA: Diagnosis not present

## 2021-02-26 DIAGNOSIS — N400 Enlarged prostate without lower urinary tract symptoms: Secondary | ICD-10-CM | POA: Diagnosis not present

## 2021-02-26 DIAGNOSIS — D649 Anemia, unspecified: Secondary | ICD-10-CM | POA: Diagnosis not present

## 2021-02-26 DIAGNOSIS — I482 Chronic atrial fibrillation, unspecified: Secondary | ICD-10-CM | POA: Diagnosis not present

## 2021-02-26 DIAGNOSIS — C189 Malignant neoplasm of colon, unspecified: Secondary | ICD-10-CM | POA: Diagnosis not present

## 2021-03-01 DIAGNOSIS — I482 Chronic atrial fibrillation, unspecified: Secondary | ICD-10-CM | POA: Diagnosis not present

## 2021-03-01 DIAGNOSIS — N400 Enlarged prostate without lower urinary tract symptoms: Secondary | ICD-10-CM | POA: Diagnosis not present

## 2021-03-01 DIAGNOSIS — D649 Anemia, unspecified: Secondary | ICD-10-CM | POA: Diagnosis not present

## 2021-03-01 DIAGNOSIS — I10 Essential (primary) hypertension: Secondary | ICD-10-CM | POA: Diagnosis not present

## 2021-03-01 DIAGNOSIS — H409 Unspecified glaucoma: Secondary | ICD-10-CM | POA: Diagnosis not present

## 2021-03-01 DIAGNOSIS — C189 Malignant neoplasm of colon, unspecified: Secondary | ICD-10-CM | POA: Diagnosis not present

## 2021-03-03 DIAGNOSIS — N400 Enlarged prostate without lower urinary tract symptoms: Secondary | ICD-10-CM | POA: Diagnosis not present

## 2021-03-03 DIAGNOSIS — H409 Unspecified glaucoma: Secondary | ICD-10-CM | POA: Diagnosis not present

## 2021-03-03 DIAGNOSIS — R531 Weakness: Secondary | ICD-10-CM | POA: Diagnosis not present

## 2021-03-03 DIAGNOSIS — I10 Essential (primary) hypertension: Secondary | ICD-10-CM | POA: Diagnosis not present

## 2021-03-03 DIAGNOSIS — I639 Cerebral infarction, unspecified: Secondary | ICD-10-CM | POA: Diagnosis not present

## 2021-03-03 DIAGNOSIS — D649 Anemia, unspecified: Secondary | ICD-10-CM | POA: Diagnosis not present

## 2021-03-03 DIAGNOSIS — C189 Malignant neoplasm of colon, unspecified: Secondary | ICD-10-CM | POA: Diagnosis not present

## 2021-03-03 DIAGNOSIS — I482 Chronic atrial fibrillation, unspecified: Secondary | ICD-10-CM | POA: Diagnosis not present

## 2021-03-05 DIAGNOSIS — I482 Chronic atrial fibrillation, unspecified: Secondary | ICD-10-CM | POA: Diagnosis not present

## 2021-03-05 DIAGNOSIS — I10 Essential (primary) hypertension: Secondary | ICD-10-CM | POA: Diagnosis not present

## 2021-03-05 DIAGNOSIS — C189 Malignant neoplasm of colon, unspecified: Secondary | ICD-10-CM | POA: Diagnosis not present

## 2021-03-05 DIAGNOSIS — H409 Unspecified glaucoma: Secondary | ICD-10-CM | POA: Diagnosis not present

## 2021-03-05 DIAGNOSIS — N400 Enlarged prostate without lower urinary tract symptoms: Secondary | ICD-10-CM | POA: Diagnosis not present

## 2021-03-05 DIAGNOSIS — D649 Anemia, unspecified: Secondary | ICD-10-CM | POA: Diagnosis not present

## 2021-03-06 DIAGNOSIS — C189 Malignant neoplasm of colon, unspecified: Secondary | ICD-10-CM | POA: Diagnosis not present

## 2021-03-06 DIAGNOSIS — D649 Anemia, unspecified: Secondary | ICD-10-CM | POA: Diagnosis not present

## 2021-03-06 DIAGNOSIS — H409 Unspecified glaucoma: Secondary | ICD-10-CM | POA: Diagnosis not present

## 2021-03-06 DIAGNOSIS — I482 Chronic atrial fibrillation, unspecified: Secondary | ICD-10-CM | POA: Diagnosis not present

## 2021-03-06 DIAGNOSIS — N400 Enlarged prostate without lower urinary tract symptoms: Secondary | ICD-10-CM | POA: Diagnosis not present

## 2021-03-06 DIAGNOSIS — I10 Essential (primary) hypertension: Secondary | ICD-10-CM | POA: Diagnosis not present

## 2021-03-08 DIAGNOSIS — I10 Essential (primary) hypertension: Secondary | ICD-10-CM | POA: Diagnosis not present

## 2021-03-08 DIAGNOSIS — C189 Malignant neoplasm of colon, unspecified: Secondary | ICD-10-CM | POA: Diagnosis not present

## 2021-03-08 DIAGNOSIS — H409 Unspecified glaucoma: Secondary | ICD-10-CM | POA: Diagnosis not present

## 2021-03-08 DIAGNOSIS — D649 Anemia, unspecified: Secondary | ICD-10-CM | POA: Diagnosis not present

## 2021-03-08 DIAGNOSIS — N400 Enlarged prostate without lower urinary tract symptoms: Secondary | ICD-10-CM | POA: Diagnosis not present

## 2021-03-08 DIAGNOSIS — I482 Chronic atrial fibrillation, unspecified: Secondary | ICD-10-CM | POA: Diagnosis not present

## 2021-03-11 DIAGNOSIS — I482 Chronic atrial fibrillation, unspecified: Secondary | ICD-10-CM | POA: Diagnosis not present

## 2021-03-11 DIAGNOSIS — I10 Essential (primary) hypertension: Secondary | ICD-10-CM | POA: Diagnosis not present

## 2021-03-11 DIAGNOSIS — H409 Unspecified glaucoma: Secondary | ICD-10-CM | POA: Diagnosis not present

## 2021-03-11 DIAGNOSIS — D649 Anemia, unspecified: Secondary | ICD-10-CM | POA: Diagnosis not present

## 2021-03-11 DIAGNOSIS — N400 Enlarged prostate without lower urinary tract symptoms: Secondary | ICD-10-CM | POA: Diagnosis not present

## 2021-03-11 DIAGNOSIS — C189 Malignant neoplasm of colon, unspecified: Secondary | ICD-10-CM | POA: Diagnosis not present

## 2021-03-12 ENCOUNTER — Other Ambulatory Visit: Payer: Self-pay | Admitting: *Deleted

## 2021-03-12 DIAGNOSIS — I482 Chronic atrial fibrillation, unspecified: Secondary | ICD-10-CM | POA: Diagnosis not present

## 2021-03-12 DIAGNOSIS — I10 Essential (primary) hypertension: Secondary | ICD-10-CM | POA: Diagnosis not present

## 2021-03-12 DIAGNOSIS — H409 Unspecified glaucoma: Secondary | ICD-10-CM | POA: Diagnosis not present

## 2021-03-12 DIAGNOSIS — D649 Anemia, unspecified: Secondary | ICD-10-CM | POA: Diagnosis not present

## 2021-03-12 DIAGNOSIS — R059 Cough, unspecified: Secondary | ICD-10-CM

## 2021-03-12 DIAGNOSIS — C189 Malignant neoplasm of colon, unspecified: Secondary | ICD-10-CM | POA: Diagnosis not present

## 2021-03-12 DIAGNOSIS — N400 Enlarged prostate without lower urinary tract symptoms: Secondary | ICD-10-CM | POA: Diagnosis not present

## 2021-03-12 MED ORDER — GUAIFENESIN ER 600 MG PO TB12: 600.0000 mg | ORAL_TABLET | Freq: Two times a day (BID) | ORAL | 1 refills | Status: AC

## 2021-03-13 ENCOUNTER — Encounter: Payer: Self-pay | Admitting: Oncology

## 2021-03-13 DIAGNOSIS — N400 Enlarged prostate without lower urinary tract symptoms: Secondary | ICD-10-CM | POA: Diagnosis not present

## 2021-03-13 DIAGNOSIS — D649 Anemia, unspecified: Secondary | ICD-10-CM | POA: Diagnosis not present

## 2021-03-13 DIAGNOSIS — C189 Malignant neoplasm of colon, unspecified: Secondary | ICD-10-CM | POA: Diagnosis not present

## 2021-03-13 DIAGNOSIS — I482 Chronic atrial fibrillation, unspecified: Secondary | ICD-10-CM | POA: Diagnosis not present

## 2021-03-13 DIAGNOSIS — H409 Unspecified glaucoma: Secondary | ICD-10-CM | POA: Diagnosis not present

## 2021-03-13 DIAGNOSIS — I10 Essential (primary) hypertension: Secondary | ICD-10-CM | POA: Diagnosis not present

## 2021-03-15 DIAGNOSIS — D649 Anemia, unspecified: Secondary | ICD-10-CM | POA: Diagnosis not present

## 2021-03-15 DIAGNOSIS — N400 Enlarged prostate without lower urinary tract symptoms: Secondary | ICD-10-CM | POA: Diagnosis not present

## 2021-03-15 DIAGNOSIS — C189 Malignant neoplasm of colon, unspecified: Secondary | ICD-10-CM | POA: Diagnosis not present

## 2021-03-15 DIAGNOSIS — I482 Chronic atrial fibrillation, unspecified: Secondary | ICD-10-CM | POA: Diagnosis not present

## 2021-03-15 DIAGNOSIS — I10 Essential (primary) hypertension: Secondary | ICD-10-CM | POA: Diagnosis not present

## 2021-03-15 DIAGNOSIS — H409 Unspecified glaucoma: Secondary | ICD-10-CM | POA: Diagnosis not present

## 2021-03-19 DIAGNOSIS — N400 Enlarged prostate without lower urinary tract symptoms: Secondary | ICD-10-CM | POA: Diagnosis not present

## 2021-03-19 DIAGNOSIS — H409 Unspecified glaucoma: Secondary | ICD-10-CM | POA: Diagnosis not present

## 2021-03-19 DIAGNOSIS — I10 Essential (primary) hypertension: Secondary | ICD-10-CM | POA: Diagnosis not present

## 2021-03-19 DIAGNOSIS — D649 Anemia, unspecified: Secondary | ICD-10-CM | POA: Diagnosis not present

## 2021-03-19 DIAGNOSIS — C189 Malignant neoplasm of colon, unspecified: Secondary | ICD-10-CM | POA: Diagnosis not present

## 2021-03-19 DIAGNOSIS — I482 Chronic atrial fibrillation, unspecified: Secondary | ICD-10-CM | POA: Diagnosis not present

## 2021-03-20 DIAGNOSIS — N400 Enlarged prostate without lower urinary tract symptoms: Secondary | ICD-10-CM | POA: Diagnosis not present

## 2021-03-20 DIAGNOSIS — H409 Unspecified glaucoma: Secondary | ICD-10-CM | POA: Diagnosis not present

## 2021-03-20 DIAGNOSIS — D649 Anemia, unspecified: Secondary | ICD-10-CM | POA: Diagnosis not present

## 2021-03-20 DIAGNOSIS — C189 Malignant neoplasm of colon, unspecified: Secondary | ICD-10-CM | POA: Diagnosis not present

## 2021-03-20 DIAGNOSIS — I10 Essential (primary) hypertension: Secondary | ICD-10-CM | POA: Diagnosis not present

## 2021-03-20 DIAGNOSIS — I482 Chronic atrial fibrillation, unspecified: Secondary | ICD-10-CM | POA: Diagnosis not present

## 2021-03-21 ENCOUNTER — Telehealth: Payer: Self-pay

## 2021-03-21 ENCOUNTER — Other Ambulatory Visit: Payer: Self-pay

## 2021-03-21 DIAGNOSIS — N400 Enlarged prostate without lower urinary tract symptoms: Secondary | ICD-10-CM | POA: Diagnosis not present

## 2021-03-21 DIAGNOSIS — C189 Malignant neoplasm of colon, unspecified: Secondary | ICD-10-CM | POA: Diagnosis not present

## 2021-03-21 DIAGNOSIS — D649 Anemia, unspecified: Secondary | ICD-10-CM | POA: Diagnosis not present

## 2021-03-21 DIAGNOSIS — I10 Essential (primary) hypertension: Secondary | ICD-10-CM | POA: Diagnosis not present

## 2021-03-21 DIAGNOSIS — H409 Unspecified glaucoma: Secondary | ICD-10-CM | POA: Diagnosis not present

## 2021-03-21 DIAGNOSIS — I482 Chronic atrial fibrillation, unspecified: Secondary | ICD-10-CM | POA: Diagnosis not present

## 2021-03-21 MED ORDER — SCOPOLAMINE 1 MG/3DAYS TD PT72: 1.0000 | MEDICATED_PATCH | TRANSDERMAL | 12 refills | Status: AC

## 2021-03-21 NOTE — Telephone Encounter (Signed)
TC from [place] stating Pt would need his prescription for ativan changed over to liquid form and also would like a  prescription for scopolamine patches. Inquired with [facility] per Dr Benay Spice. Prescriptions called in to [facility] in [place].

## 2021-03-22 DIAGNOSIS — C189 Malignant neoplasm of colon, unspecified: Secondary | ICD-10-CM | POA: Diagnosis not present

## 2021-03-22 DIAGNOSIS — H409 Unspecified glaucoma: Secondary | ICD-10-CM | POA: Diagnosis not present

## 2021-03-22 DIAGNOSIS — I10 Essential (primary) hypertension: Secondary | ICD-10-CM | POA: Diagnosis not present

## 2021-03-22 DIAGNOSIS — I482 Chronic atrial fibrillation, unspecified: Secondary | ICD-10-CM | POA: Diagnosis not present

## 2021-03-22 DIAGNOSIS — N400 Enlarged prostate without lower urinary tract symptoms: Secondary | ICD-10-CM | POA: Diagnosis not present

## 2021-03-22 DIAGNOSIS — D649 Anemia, unspecified: Secondary | ICD-10-CM | POA: Diagnosis not present

## 2021-03-23 DIAGNOSIS — I10 Essential (primary) hypertension: Secondary | ICD-10-CM | POA: Diagnosis not present

## 2021-03-23 DIAGNOSIS — H409 Unspecified glaucoma: Secondary | ICD-10-CM | POA: Diagnosis not present

## 2021-03-23 DIAGNOSIS — N400 Enlarged prostate without lower urinary tract symptoms: Secondary | ICD-10-CM | POA: Diagnosis not present

## 2021-03-23 DIAGNOSIS — D649 Anemia, unspecified: Secondary | ICD-10-CM | POA: Diagnosis not present

## 2021-03-23 DIAGNOSIS — I482 Chronic atrial fibrillation, unspecified: Secondary | ICD-10-CM | POA: Diagnosis not present

## 2021-03-23 DIAGNOSIS — C189 Malignant neoplasm of colon, unspecified: Secondary | ICD-10-CM | POA: Diagnosis not present

## 2021-03-25 ENCOUNTER — Encounter: Payer: Self-pay | Admitting: *Deleted

## 2021-03-25 NOTE — Progress Notes (Signed)
Received message from [place] with [facility]. Patient died on 2021/03/29 at 12:30 pm in his home. Dr. Benay Spice notified.

## 2021-04-03 DEATH — deceased

## 2021-04-25 ENCOUNTER — Encounter: Payer: Self-pay | Admitting: Oncology

## 2021-04-26 IMAGING — DX DG CHEST 2V
2 series · 2 of 2 positions shown · non-contrast
Comparison: CT 03/06/2020

CLINICAL DATA: Chest pain

EXAM:
CHEST - 2 VIEW

[chest pa]
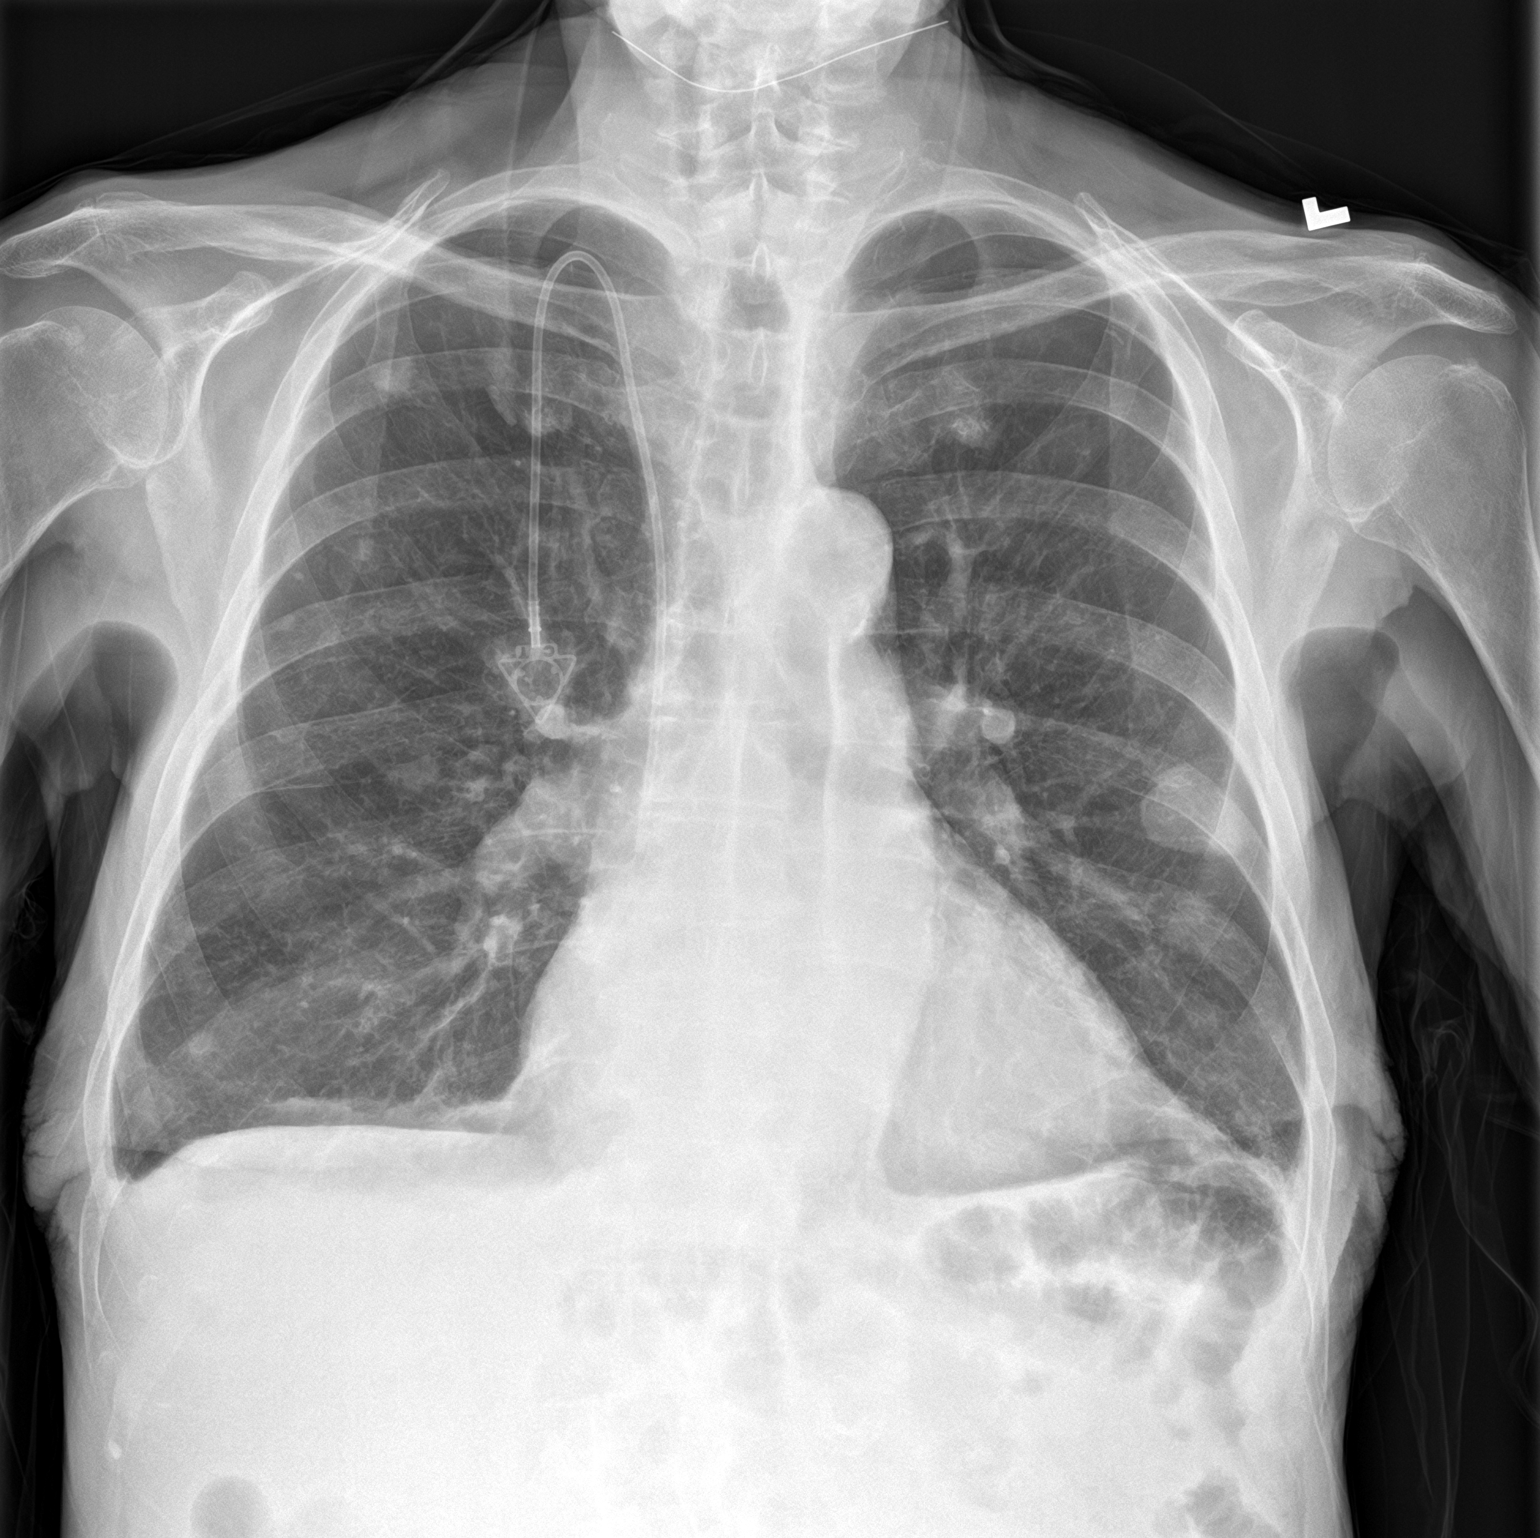

[chest lat]
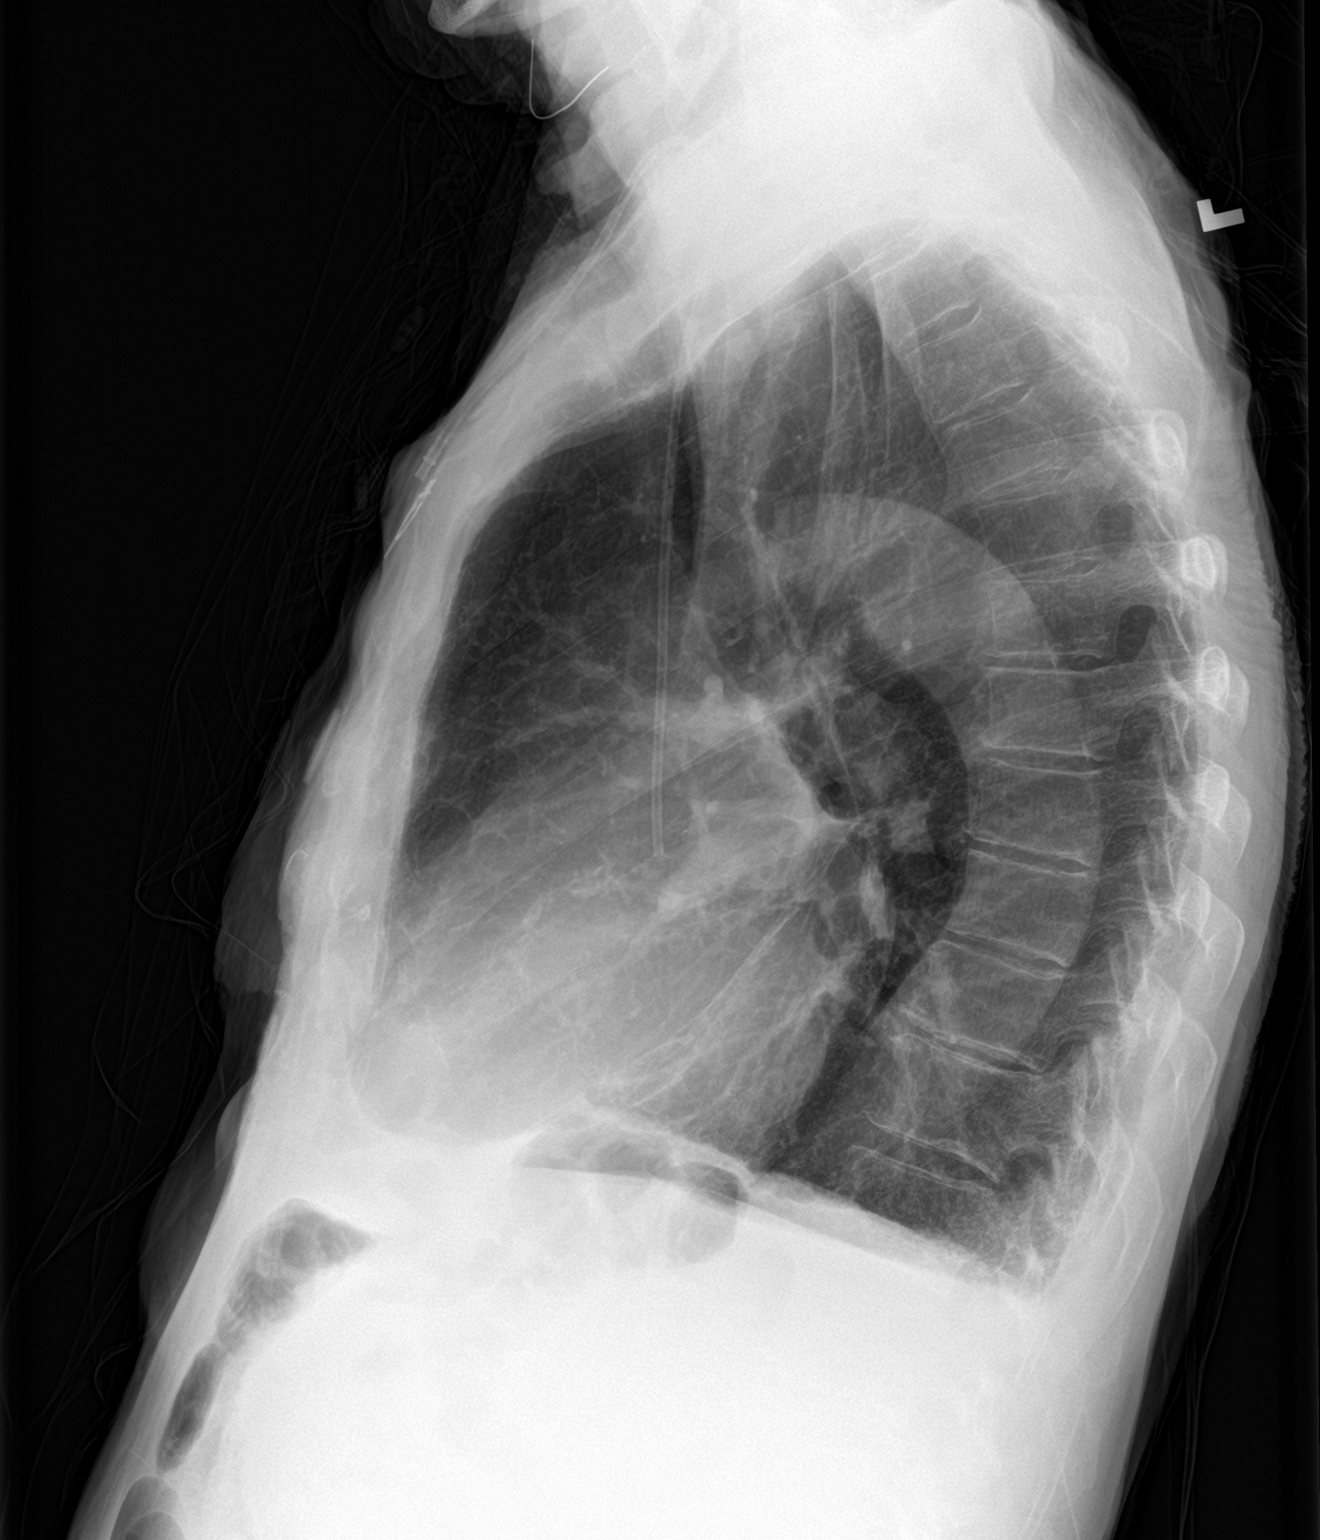

[2 of 2 positions shown; findings below may reference images not displayed]

FINDINGS: Right Port-A-Cath in place with the tip at the cavoatrial junction.
Heart is normal size. There is hyperinflation of the lungs
compatible with COPD. Multiple nodular densities project over both
lungs, the largest the 3 cm rounded area projecting over the left
mid lung peripherally. No effusions. No acute bony abnormality.
IMPRESSION: Nodular densities project over both lungs including 3 cm rounded
mass in the left mid lung region. Recommend chest CT for further
evaluation.

COPD.

## 2021-05-02 ENCOUNTER — Ambulatory Visit: Payer: Medicare Other | Admitting: Student

## 2021-05-03 ENCOUNTER — Ambulatory Visit: Payer: Medicare Other | Admitting: Student

## 2021-05-19 IMAGING — CT CT ABD-PELV W/ CM
1 of 3 series · 13 of 32 positions shown, 18 images · IV contrast (APPLIED)
Comparison: 09/20/2020

CLINICAL DATA: Restaging colorectal carcinoma

EXAM:
CT ABDOMEN AND PELVIS WITH CONTRAST
TECHNIQUE: Multidetector CT imaging of the abdomen and pelvis was performed
using the standard protocol following bolus administration of
intravenous contrast.
CONTRAST:  80mL OMNIPAQUE IOHEXOL 300 MG/ML  SOLN

[Series 2: abd pel w · axial · 0.75mm/px · z∈[+809,+1184]mm · 13 of 86 slices shown, 18 images]
[im 6/86  soft-tissue]
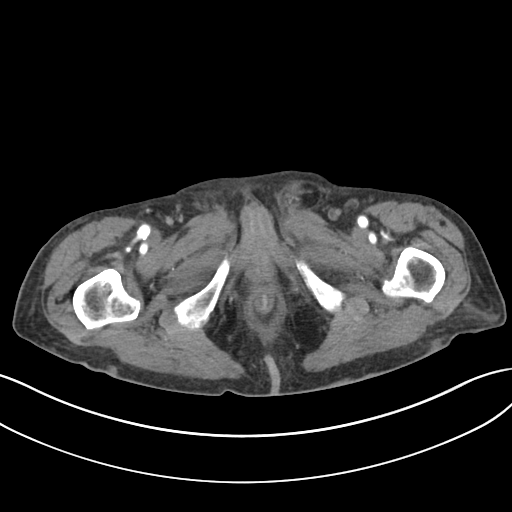
[im 6/86  bone]
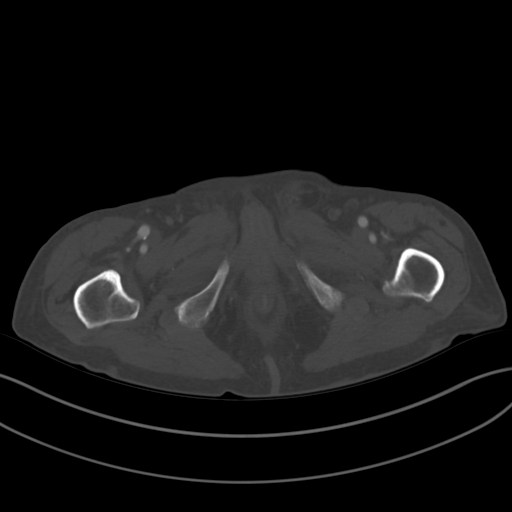
[im 16/86  soft-tissue]
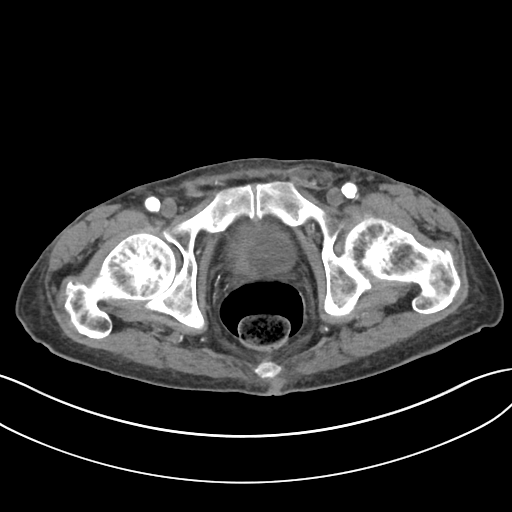
[im 21/86  soft-tissue]
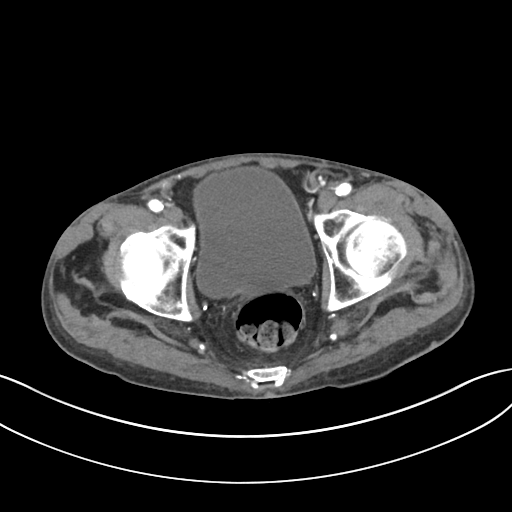
[im 26/86  soft-tissue]
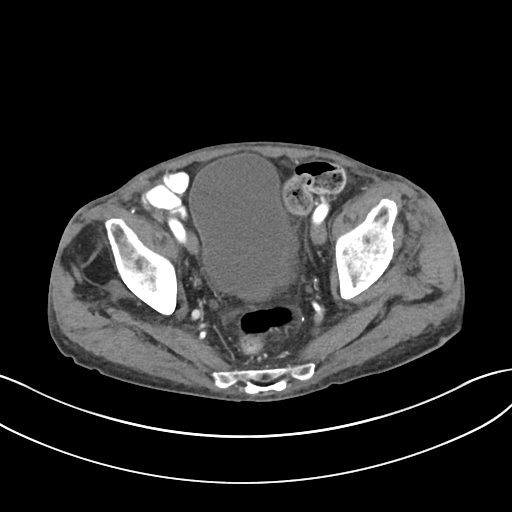
[im 36/86  soft-tissue]
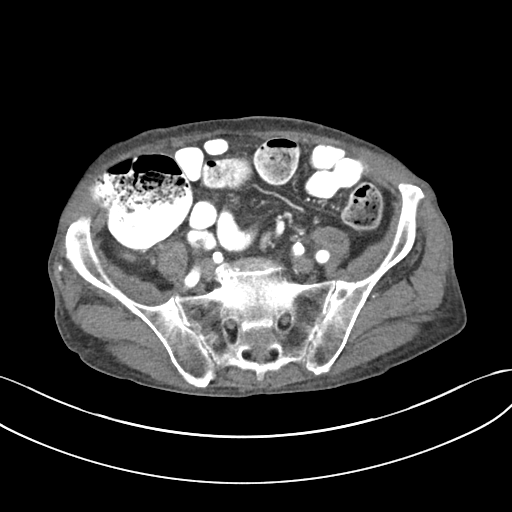
[im 41/86  soft-tissue]
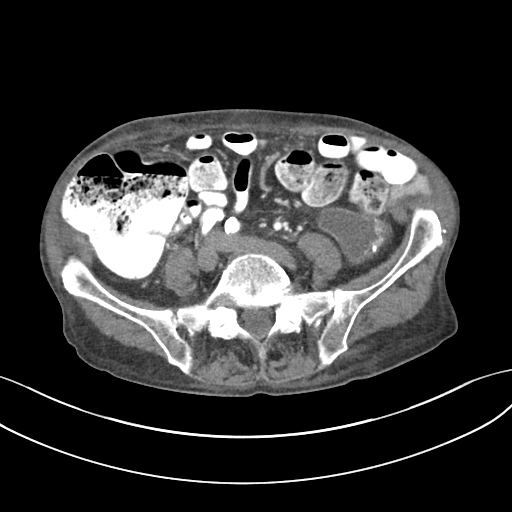
[im 46/86  soft-tissue]
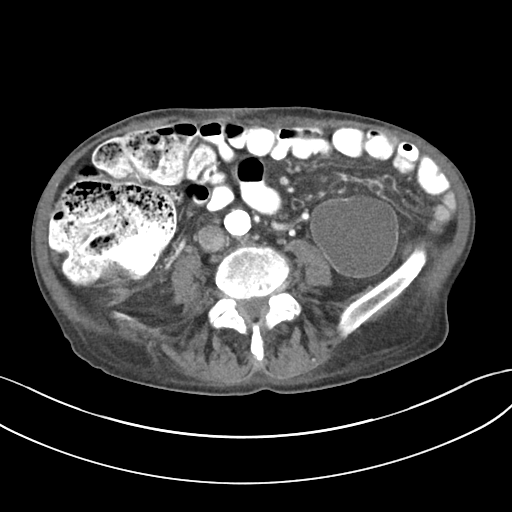
[im 56/86  soft-tissue]
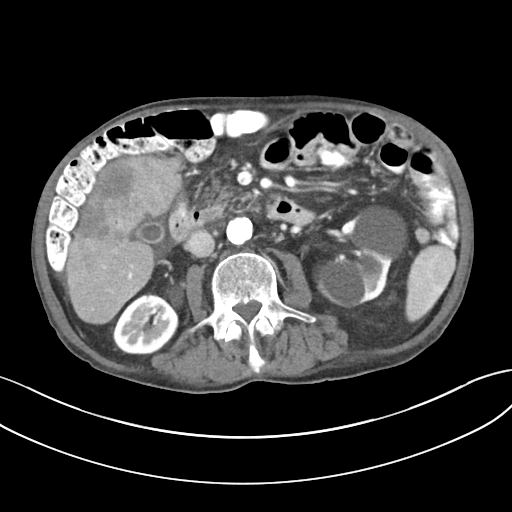
[im 61/86  soft-tissue]
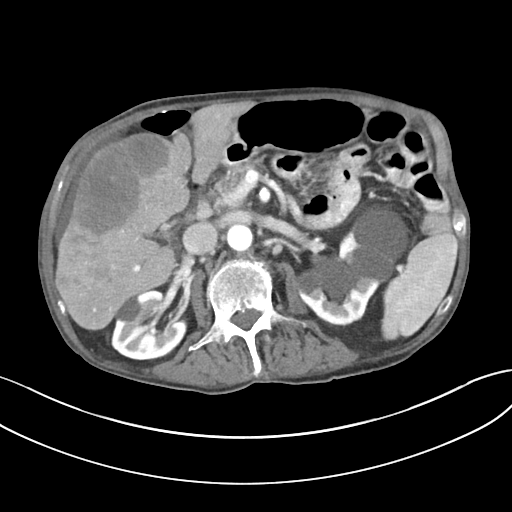
[im 61/86  bone]
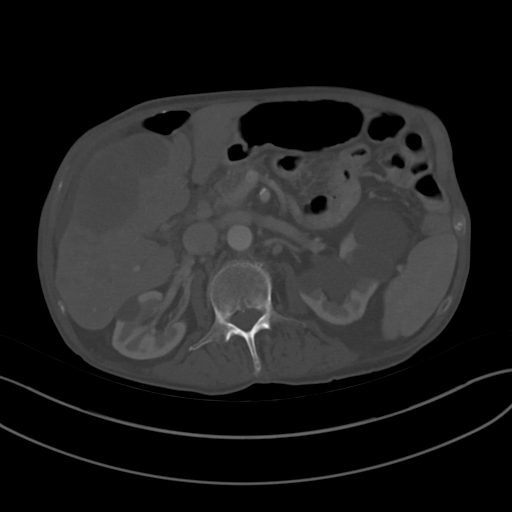
[im 66/86  soft-tissue]
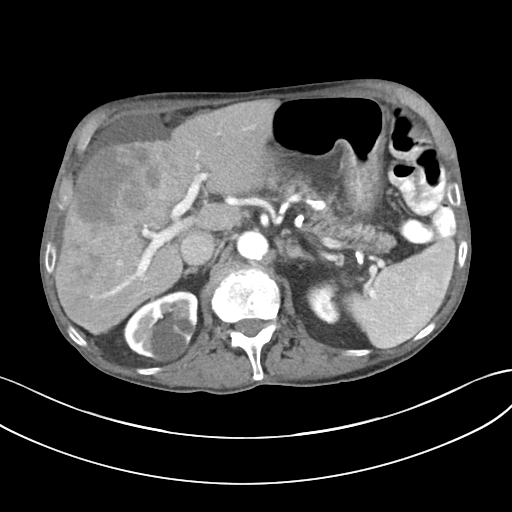
[im 66/86  lung]
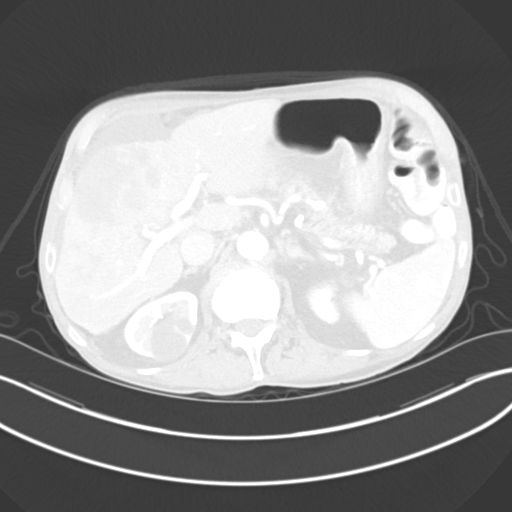
[im 71/86  lung]
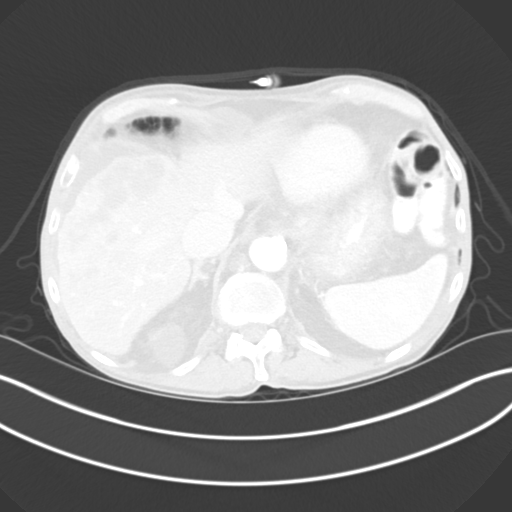
[im 76/86  soft-tissue]
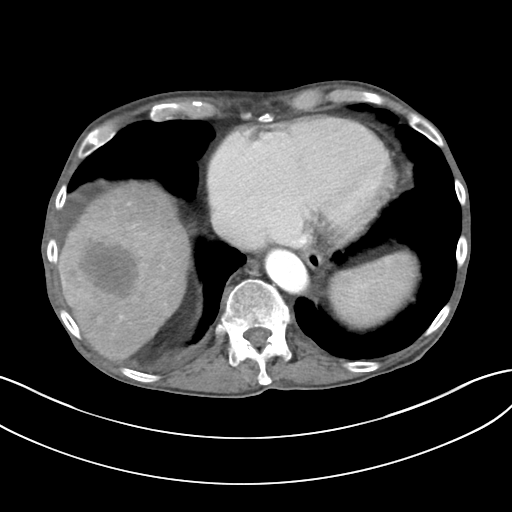
[im 76/86  lung]
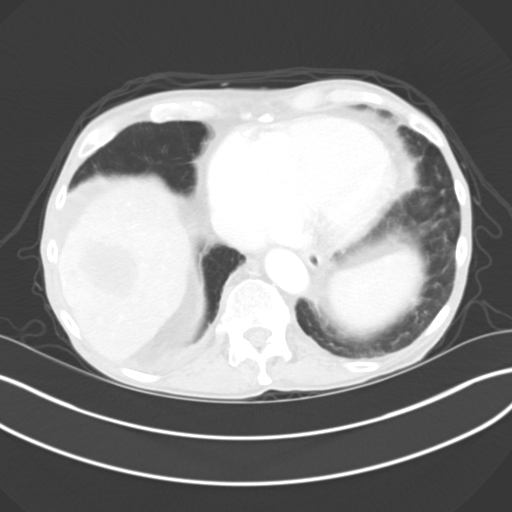
[im 81/86  soft-tissue]
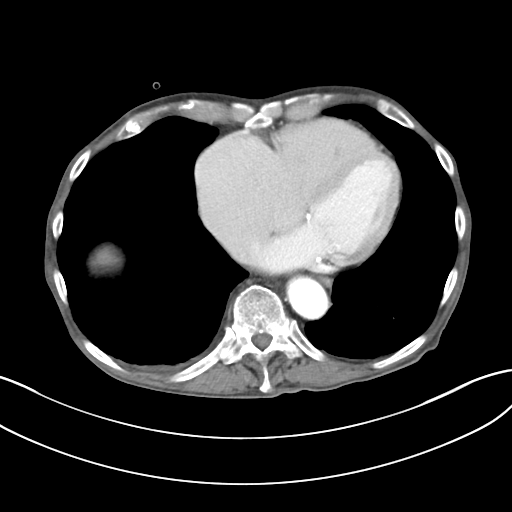
[im 81/86  lung]
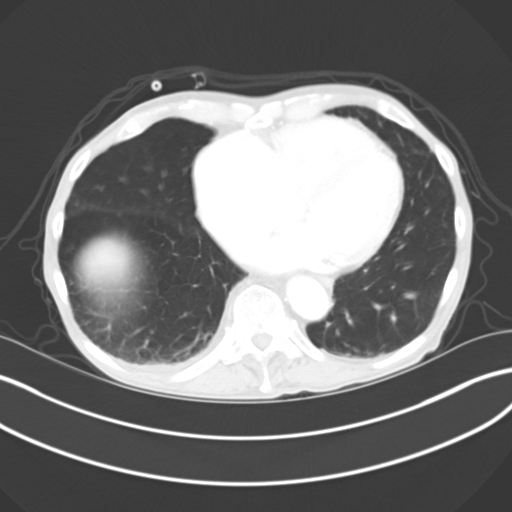

[13 of 32 positions shown; findings below may reference images not displayed]

FINDINGS: Lower chest: No acute abnormality. Nodule within the lingula
measures 4 mm, image [DATE]. Unchanged from 03/06/2020.

Subpleural nodule in the posterior right lower lobe measures 6 mm,
image [DATE]. Previously 9 mm.

Hepatobiliary: Multiple low-attenuation lesions are again identified
involving the right lobe of liver and medial segment of left hepatic
lobe.

-The dominant lesion within segment 8 measures 4.6 x 3.6 cm, image
[DATE]. This is compared with 5.2 x 5.2 cm previously.

-Lesion involving segments 7, 5 and 4 measures 7.3 x 4.8 cm, image
[DATE]. Previously 8.7 x 6.1 cm.

Mild gallbladder wall edema, nonspecific. No biliary ductal
dilatation

Pancreas: Normal appearance of the pancreas

Spleen: Normal in size without focal abnormality.

Adrenals/Urinary Tract: The adrenal glands are within normal limits.
Bilateral kidney cysts are again noted. Dominant cyst arising off
the inferior pole of the left kidney measures 6.9 x 5.9 cm.

No hydronephrosis identified bilaterally. The urinary bladder is
unremarkable.

Stomach/Bowel: Stomach appears nondistended. Status post partial
colectomy with resection of the mid and distal transverse colon. No
bowel wall thickening, inflammation or distension.

Vascular/Lymphatic: Aortic atherosclerosis. No aneurysm. No
abdominopelvic adenopathy. Index gastrohepatic ligament lymph node
referenced previously is stable measuring 8 mm, image [DATE].

Reproductive: Prostate is unremarkable.

Other: No ascites or focal fluid collections. No peritoneal nodule
or mass identified. Left inguinal hernia is again noted.

Musculoskeletal: Degenerative changes noted within the lumbar spine.
This is most advanced at L5-S1.
IMPRESSION: 1. Interval response to therapy. Decrease in size of index liver
metastasis as described above. Subpleural nodule in the posterior
right lower lobe is also decreased in the interval. No progressive
or new sites of disease.
2. Stable 8 mm gastrohepatic ligament lymph node.
3.  Aortic Atherosclerosis ([id number]).

## 2022-05-26 ENCOUNTER — Other Ambulatory Visit: Payer: Self-pay
